# Patient Record
Sex: Male | Born: 1935
Health system: Southern US, Community
[De-identification: ages and names within clinical notes are randomized; demographics above are authoritative.]

## PROBLEM LIST (undated history)

## (undated) DIAGNOSIS — Z9221 Personal history of antineoplastic chemotherapy: Secondary | ICD-10-CM

## (undated) DIAGNOSIS — I2510 Atherosclerotic heart disease of native coronary artery without angina pectoris: Secondary | ICD-10-CM

## (undated) DIAGNOSIS — C679 Malignant neoplasm of bladder, unspecified: Secondary | ICD-10-CM

## (undated) DIAGNOSIS — Z8673 Personal history of transient ischemic attack (TIA), and cerebral infarction without residual deficits: Secondary | ICD-10-CM

## (undated) DIAGNOSIS — R011 Cardiac murmur, unspecified: Secondary | ICD-10-CM

## (undated) DIAGNOSIS — Z87442 Personal history of urinary calculi: Secondary | ICD-10-CM

## (undated) DIAGNOSIS — N182 Chronic kidney disease, stage 2 (mild): Secondary | ICD-10-CM

## (undated) DIAGNOSIS — Z8669 Personal history of other diseases of the nervous system and sense organs: Secondary | ICD-10-CM

## (undated) DIAGNOSIS — E785 Hyperlipidemia, unspecified: Secondary | ICD-10-CM

## (undated) DIAGNOSIS — K429 Umbilical hernia without obstruction or gangrene: Secondary | ICD-10-CM

## (undated) DIAGNOSIS — I350 Nonrheumatic aortic (valve) stenosis: Secondary | ICD-10-CM

## (undated) DIAGNOSIS — J189 Pneumonia, unspecified organism: Secondary | ICD-10-CM

## (undated) DIAGNOSIS — K861 Other chronic pancreatitis: Secondary | ICD-10-CM

## (undated) DIAGNOSIS — D509 Iron deficiency anemia, unspecified: Secondary | ICD-10-CM

## (undated) DIAGNOSIS — K862 Cyst of pancreas: Secondary | ICD-10-CM

## (undated) DIAGNOSIS — I700 Atherosclerosis of aorta: Secondary | ICD-10-CM

## (undated) DIAGNOSIS — N281 Cyst of kidney, acquired: Secondary | ICD-10-CM

## (undated) DIAGNOSIS — K922 Gastrointestinal hemorrhage, unspecified: Secondary | ICD-10-CM

## (undated) DIAGNOSIS — E119 Type 2 diabetes mellitus without complications: Secondary | ICD-10-CM

## (undated) DIAGNOSIS — Z936 Other artificial openings of urinary tract status: Secondary | ICD-10-CM

## (undated) DIAGNOSIS — R9431 Abnormal electrocardiogram [ECG] [EKG]: Secondary | ICD-10-CM

## (undated) DIAGNOSIS — I10 Essential (primary) hypertension: Secondary | ICD-10-CM

## (undated) HISTORY — PX: TONSILLECTOMY AND ADENOIDECTOMY: SHX28

## (undated) HISTORY — PX: VASECTOMY: SHX75

## (undated) HISTORY — DX: Nonrheumatic aortic (valve) stenosis: I35.0

## (undated) HISTORY — DX: Atherosclerotic heart disease of native coronary artery without angina pectoris: I25.10

## (undated) HISTORY — DX: Other artificial openings of urinary tract status: Z93.6

## (undated) HISTORY — DX: Atherosclerosis of aorta: I70.0

## (undated) HISTORY — DX: Personal history of other diseases of the nervous system and sense organs: Z86.69

## (undated) HISTORY — DX: Personal history of transient ischemic attack (TIA), and cerebral infarction without residual deficits: Z86.73

## (undated) HISTORY — DX: Cyst of pancreas: K86.2

## (undated) HISTORY — PX: EYE SURGERY: SHX253

## (undated) HISTORY — DX: Iron deficiency anemia, unspecified: D50.9

## (undated) HISTORY — PX: OSTOMY: SHX5997

## (undated) HISTORY — PX: CATARACT EXTRACTION: SUR2

## (undated) HISTORY — DX: Malignant neoplasm of bladder, unspecified: C67.9

## (undated) HISTORY — PX: TRANSTHORACIC ECHOCARDIOGRAM: SHX275

## (undated) HISTORY — DX: Abnormal electrocardiogram (ECG) (EKG): R94.31

## (undated) HISTORY — DX: Umbilical hernia without obstruction or gangrene: K42.9

## (undated) HISTORY — DX: Cardiac murmur, unspecified: R01.1

## (undated) HISTORY — DX: Chronic kidney disease, stage 2 (mild): N18.2

## (undated) HISTORY — DX: Gastrointestinal hemorrhage, unspecified: K92.2

## (undated) HISTORY — DX: Hyperlipidemia, unspecified: E78.5

## (undated) HISTORY — DX: Cyst of kidney, acquired: N28.1

## (undated) HISTORY — DX: Other chronic pancreatitis: K86.1

---

## 2012-07-14 DIAGNOSIS — R9431 Abnormal electrocardiogram [ECG] [EKG]: Secondary | ICD-10-CM

## 2012-07-14 HISTORY — DX: Abnormal electrocardiogram (ECG) (EKG): R94.31

## 2013-01-26 HISTORY — PX: CARDIOVASCULAR STRESS TEST: SHX262

## 2013-02-09 HISTORY — PX: CARDIAC CATHETERIZATION: SHX172

## 2014-09-04 DIAGNOSIS — E119 Type 2 diabetes mellitus without complications: Secondary | ICD-10-CM | POA: Diagnosis not present

## 2014-09-07 DIAGNOSIS — E785 Hyperlipidemia, unspecified: Secondary | ICD-10-CM | POA: Diagnosis not present

## 2014-09-07 DIAGNOSIS — E119 Type 2 diabetes mellitus without complications: Secondary | ICD-10-CM | POA: Diagnosis not present

## 2014-09-07 DIAGNOSIS — I10 Essential (primary) hypertension: Secondary | ICD-10-CM | POA: Diagnosis not present

## 2015-01-04 DIAGNOSIS — E119 Type 2 diabetes mellitus without complications: Secondary | ICD-10-CM | POA: Diagnosis not present

## 2015-01-08 DIAGNOSIS — E119 Type 2 diabetes mellitus without complications: Secondary | ICD-10-CM | POA: Diagnosis not present

## 2015-01-08 DIAGNOSIS — E785 Hyperlipidemia, unspecified: Secondary | ICD-10-CM | POA: Diagnosis not present

## 2015-01-08 DIAGNOSIS — I10 Essential (primary) hypertension: Secondary | ICD-10-CM | POA: Diagnosis not present

## 2015-03-02 DIAGNOSIS — D489 Neoplasm of uncertain behavior, unspecified: Secondary | ICD-10-CM | POA: Diagnosis not present

## 2015-03-02 DIAGNOSIS — E119 Type 2 diabetes mellitus without complications: Secondary | ICD-10-CM | POA: Diagnosis not present

## 2015-03-28 DIAGNOSIS — L308 Other specified dermatitis: Secondary | ICD-10-CM | POA: Diagnosis not present

## 2015-03-28 DIAGNOSIS — L820 Inflamed seborrheic keratosis: Secondary | ICD-10-CM | POA: Diagnosis not present

## 2015-03-28 DIAGNOSIS — L570 Actinic keratosis: Secondary | ICD-10-CM | POA: Diagnosis not present

## 2015-05-03 DIAGNOSIS — E119 Type 2 diabetes mellitus without complications: Secondary | ICD-10-CM | POA: Diagnosis not present

## 2015-05-08 DIAGNOSIS — I10 Essential (primary) hypertension: Secondary | ICD-10-CM | POA: Diagnosis not present

## 2015-05-08 DIAGNOSIS — E119 Type 2 diabetes mellitus without complications: Secondary | ICD-10-CM | POA: Diagnosis not present

## 2015-05-08 DIAGNOSIS — E785 Hyperlipidemia, unspecified: Secondary | ICD-10-CM | POA: Diagnosis not present

## 2015-09-10 DIAGNOSIS — E119 Type 2 diabetes mellitus without complications: Secondary | ICD-10-CM | POA: Diagnosis not present

## 2015-09-13 DIAGNOSIS — J309 Allergic rhinitis, unspecified: Secondary | ICD-10-CM | POA: Diagnosis not present

## 2015-09-13 DIAGNOSIS — Z0000 Encounter for general adult medical examination without abnormal findings: Secondary | ICD-10-CM | POA: Diagnosis not present

## 2015-09-13 DIAGNOSIS — I10 Essential (primary) hypertension: Secondary | ICD-10-CM | POA: Diagnosis not present

## 2015-09-13 DIAGNOSIS — E119 Type 2 diabetes mellitus without complications: Secondary | ICD-10-CM | POA: Diagnosis not present

## 2015-09-13 DIAGNOSIS — E785 Hyperlipidemia, unspecified: Secondary | ICD-10-CM | POA: Diagnosis not present

## 2016-01-14 DIAGNOSIS — I10 Essential (primary) hypertension: Secondary | ICD-10-CM | POA: Diagnosis not present

## 2016-01-14 DIAGNOSIS — E785 Hyperlipidemia, unspecified: Secondary | ICD-10-CM | POA: Diagnosis not present

## 2016-01-14 DIAGNOSIS — E119 Type 2 diabetes mellitus without complications: Secondary | ICD-10-CM | POA: Diagnosis not present

## 2016-01-21 DIAGNOSIS — E785 Hyperlipidemia, unspecified: Secondary | ICD-10-CM | POA: Diagnosis not present

## 2016-01-21 DIAGNOSIS — I10 Essential (primary) hypertension: Secondary | ICD-10-CM | POA: Diagnosis not present

## 2016-01-21 DIAGNOSIS — E119 Type 2 diabetes mellitus without complications: Secondary | ICD-10-CM | POA: Diagnosis not present

## 2016-03-14 DIAGNOSIS — K861 Other chronic pancreatitis: Secondary | ICD-10-CM

## 2016-03-14 DIAGNOSIS — C679 Malignant neoplasm of bladder, unspecified: Secondary | ICD-10-CM

## 2016-03-14 DIAGNOSIS — K862 Cyst of pancreas: Secondary | ICD-10-CM

## 2016-03-14 HISTORY — DX: Other chronic pancreatitis: K86.1

## 2016-03-14 HISTORY — DX: Malignant neoplasm of bladder, unspecified: C67.9

## 2016-03-14 HISTORY — DX: Cyst of pancreas: K86.2

## 2016-03-31 ENCOUNTER — Encounter (HOSPITAL_BASED_OUTPATIENT_CLINIC_OR_DEPARTMENT_OTHER): Payer: Self-pay | Admitting: Emergency Medicine

## 2016-03-31 ENCOUNTER — Emergency Department (HOSPITAL_BASED_OUTPATIENT_CLINIC_OR_DEPARTMENT_OTHER): Payer: Medicare Other

## 2016-03-31 ENCOUNTER — Observation Stay (HOSPITAL_BASED_OUTPATIENT_CLINIC_OR_DEPARTMENT_OTHER)
Admission: EM | Admit: 2016-03-31 | Discharge: 2016-04-02 | Disposition: A | Discharge status: Home / Self Care | Payer: Medicare Other | Attending: Internal Medicine | Admitting: Internal Medicine

## 2016-03-31 DIAGNOSIS — I10 Essential (primary) hypertension: Secondary | ICD-10-CM | POA: Insufficient documentation

## 2016-03-31 DIAGNOSIS — E785 Hyperlipidemia, unspecified: Secondary | ICD-10-CM | POA: Insufficient documentation

## 2016-03-31 DIAGNOSIS — Z7984 Long term (current) use of oral hypoglycemic drugs: Secondary | ICD-10-CM | POA: Insufficient documentation

## 2016-03-31 DIAGNOSIS — R310 Gross hematuria: Secondary | ICD-10-CM

## 2016-03-31 DIAGNOSIS — Z7982 Long term (current) use of aspirin: Secondary | ICD-10-CM | POA: Insufficient documentation

## 2016-03-31 DIAGNOSIS — R339 Retention of urine, unspecified: Secondary | ICD-10-CM | POA: Diagnosis present

## 2016-03-31 DIAGNOSIS — R3129 Other microscopic hematuria: Secondary | ICD-10-CM | POA: Diagnosis not present

## 2016-03-31 DIAGNOSIS — Z79899 Other long term (current) drug therapy: Secondary | ICD-10-CM | POA: Insufficient documentation

## 2016-03-31 DIAGNOSIS — E119 Type 2 diabetes mellitus without complications: Secondary | ICD-10-CM | POA: Insufficient documentation

## 2016-03-31 DIAGNOSIS — R319 Hematuria, unspecified: Secondary | ICD-10-CM | POA: Diagnosis not present

## 2016-03-31 DIAGNOSIS — S3722XA Contusion of bladder, initial encounter: Secondary | ICD-10-CM | POA: Diagnosis not present

## 2016-03-31 DIAGNOSIS — X58XXXA Exposure to other specified factors, initial encounter: Secondary | ICD-10-CM | POA: Insufficient documentation

## 2016-03-31 DIAGNOSIS — N390 Urinary tract infection, site not specified: Secondary | ICD-10-CM | POA: Diagnosis present

## 2016-03-31 DIAGNOSIS — C678 Malignant neoplasm of overlapping sites of bladder: Secondary | ICD-10-CM | POA: Diagnosis not present

## 2016-03-31 HISTORY — DX: Type 2 diabetes mellitus without complications: E11.9

## 2016-03-31 HISTORY — DX: Essential (primary) hypertension: I10

## 2016-03-31 LAB — URINALYSIS, ROUTINE W REFLEX MICROSCOPIC
Bilirubin Urine: NEGATIVE
Glucose, UA: NEGATIVE mg/dL
Ketones, ur: 15 mg/dL — AB
Nitrite: NEGATIVE
Protein, ur: >300 mg/dL — AB
Specific Gravity, Urine: 1.029 (ref 1.005–1.030)
pH: 6.5 (ref 5.0–8.0)

## 2016-03-31 LAB — CBC WITH DIFFERENTIAL/PLATELET
Basophils Absolute: 0.1 10*3/uL (ref 0.0–0.1)
Basophils Relative: 0 %
Eosinophils Absolute: 0.1 10*3/uL (ref 0.0–0.7)
Eosinophils Relative: 1 %
HCT: 39.5 % (ref 39.0–52.0)
Hemoglobin: 13.6 g/dL (ref 13.0–17.0)
Lymphocytes Relative: 15 %
Lymphs Abs: 2.5 10*3/uL (ref 0.7–4.0)
MCH: 30.8 pg (ref 26.0–34.0)
MCHC: 34.4 g/dL (ref 30.0–36.0)
MCV: 89.6 fL (ref 78.0–100.0)
Monocytes Absolute: 1 10*3/uL (ref 0.1–1.0)
Monocytes Relative: 6 %
Neutro Abs: 12.6 10*3/uL — ABNORMAL HIGH (ref 1.7–7.7)
Neutrophils Relative %: 78 %
Platelets: 254 10*3/uL (ref 150–400)
RBC: 4.41 MIL/uL (ref 4.22–5.81)
RDW: 13 % (ref 11.5–15.5)
WBC: 16.2 10*3/uL — ABNORMAL HIGH (ref 4.0–10.5)

## 2016-03-31 LAB — COMPREHENSIVE METABOLIC PANEL
ALT: 21 U/L (ref 17–63)
AST: 20 U/L (ref 15–41)
Albumin: 3.9 g/dL (ref 3.5–5.0)
Alkaline Phosphatase: 74 U/L (ref 38–126)
Anion gap: 13 (ref 5–15)
BUN: 20 mg/dL (ref 6–20)
CO2: 20 mmol/L — ABNORMAL LOW (ref 22–32)
Calcium: 9.3 mg/dL (ref 8.9–10.3)
Chloride: 107 mmol/L (ref 101–111)
Creatinine, Ser: 0.98 mg/dL (ref 0.61–1.24)
GFR calc Af Amer: >60 mL/min (ref >60)
GFR calc non Af Amer: >60 mL/min (ref >60)
Glucose, Bld: 157 mg/dL — ABNORMAL HIGH (ref 65–99)
Potassium: 3.7 mmol/L (ref 3.5–5.1)
Sodium: 140 mmol/L (ref 135–145)
Total Bilirubin: 0.5 mg/dL (ref 0.3–1.2)
Total Protein: 7.4 g/dL (ref 6.5–8.1)

## 2016-03-31 LAB — URINE MICROSCOPIC-ADD ON

## 2016-03-31 MED ORDER — MORPHINE SULFATE (PF) 2 MG/ML IV SOLN
2.0000 mg | Freq: Once | INTRAVENOUS | Status: AC
  Administered 2016-03-31: 2 mg via INTRAVENOUS
  Filled 2016-03-31: qty 1

## 2016-03-31 MED ORDER — ACETAMINOPHEN 325 MG PO TABS
650.0000 mg | ORAL_TABLET | Freq: Once | ORAL | Status: AC
  Administered 2016-03-31: 650 mg via ORAL
  Filled 2016-03-31: qty 2

## 2016-03-31 NOTE — ED Notes (Signed)
Before catheter bladder scan showed 157 ml, after catheter shower 18 ml. PA at bedside for post catheter scan.

## 2016-03-31 NOTE — Plan of Care (Signed)
80 yo male with history of diabetes hypertension came to emergency department with gradual onset of hematuria , no UTI without any trauma CT scan showed hematoma over bladder. He takes daily aspirin 81 mg a day Urology Resident Dr. Lenord Fellers [phone] has been consulted has 18 french foley catheter in place. Continues to clot.    98.4 hr 86 bp 142/82, rr 22 95% on RA  Hg13  Admit to tele (no med surge beds) bed obs spoke to [place] PA from [place] to Dr. Lenord Fellers agreed to see patient in consult in AM but call him sooner if there is a problem with obstruction of the catheter not amandable to flushing  Varnell Donate 11:33 PM

## 2016-03-31 NOTE — ED Triage Notes (Signed)
Patient went to the urgent care because he was unable to urinate since last night. He is now having blood in his urine

## 2016-03-31 NOTE — ED Notes (Signed)
MD at bedside. 

## 2016-03-31 NOTE — ED Notes (Signed)
Pt c/o abd pain and "spasms" MD made aware

## 2016-03-31 NOTE — ED Provider Notes (Signed)
[facility] Provider Note   CSN: [id number] Arrival date & time: 03/31/16  1750 By signing my name below, I, Shane [name], attest that this documentation has been prepared under the direction and in the presence of [name], PA-C. Electronically Signed: Georgette [name], ED Scribe. 03/31/16. 6:17 PM.  History   Chief Complaint Chief Complaint  Patient presents with  . Hematuria   HPI Comments: Shane [name] is a 80 y.o. male with h/o DM and HTN who presents to the Emergency Department complaining of gradual onset hematuria onset last night. Pt also has associated dysuria. Pt states he went to the urgent care earlier last night because he had difficulty urinating. Since then, pt has been having hematuria and dysuria. His last normal urination was earlier yesterday. He denies any recent injury or trauma to the penis. Pt denies h/o prostate problems but spouse notes that he hasn't had a prostate exam in a long time. He denies h/o cancers. Pt takes Aspirin once a day. He is compliant with his medications. Pt denies fever.   The history is provided by the patient and the spouse. No language interpreter was used.    Past Medical History:  Diagnosis Date  . Diabetes mellitus without complication ([place])   . Hypertension     There are no active problems to display for this patient.   Past Surgical History:  Procedure Laterality Date  . CATARACT EXTRACTION       Home Medications    Prior to Admission medications   Medication Sig Start Date End Date Taking? Authorizing Provider  lisinopril (PRINIVIL,ZESTRIL) 20 MG tablet Take 20 mg by mouth daily.   Yes Historical Provider, MD  pravastatin (PRAVACHOL) 20 MG tablet Take 20 mg by mouth daily.   Yes Historical Provider, MD  sitaGLIPtin-metformin (JANUMET) 50-1000 MG tablet Take 1 tablet by mouth 2 (two) times daily with a meal.   Yes Historical Provider, MD    Family History History reviewed. No pertinent family history.  Social  History Social History  Substance Use Topics  . Smoking status: Never Smoker  . Smokeless tobacco: Never Used  . Alcohol use No     Allergies   Review of patient's allergies indicates no known allergies.   Review of Systems Review of Systems 10 Systems reviewed and all are negative for acute change except as noted in the HPI. Physical Exam Updated Vital Signs BP 167/86 (BP Location: Right Arm)   Pulse 117   Temp 98.4 F (36.9 C) (Oral)   Resp 22   Ht 5\' 8"  (1.727 m)   Wt 210 lb (95.3 kg)   SpO2 100%   BMI 31.93 kg/m   Physical Exam  Constitutional: He is oriented to person, place, and time. Vital signs are normal. He appears well-developed and well-nourished.  HENT:  Head: Normocephalic.  Right Ear: Hearing normal.  Left Ear: Hearing normal.  Eyes: Conjunctivae and EOM are normal. Pupils are equal, round, and reactive to light.  Neck: Normal range of motion. Neck supple.  Cardiovascular: Normal rate, regular rhythm, normal heart sounds and intact distal pulses.   No murmur heard. Pulmonary/Chest: Effort normal and breath sounds normal. No respiratory distress.  Abdominal: He exhibits no distension.  Genitourinary: Testes normal. No hypospadias, penile erythema or penile tenderness. Discharge (hematuria) found.  Genitourinary Comments: Gross hematuria on exam. Suprapubic region non tender to palpation, non-distended after foley placement.  Musculoskeletal: Normal range of motion.  Neurological: He is alert and oriented to person, place,  and time.  Skin: Skin is warm and dry.  Psychiatric: He has a normal mood and affect. His speech is normal and behavior is normal. Thought content normal.  Nursing note and vitals reviewed.  ED Treatments / Results  DIAGNOSTIC STUDIES: Oxygen Saturation is 100% on RA, normal by my interpretation.    COORDINATION OF CARE: 6:15 PM Discussed treatment plan with pt at bedside which includes urinalysis and blood work and pt agreed to  plan.  Labs (all labs ordered are listed, but only abnormal results are displayed) Labs Reviewed  CBC WITH DIFFERENTIAL/PLATELET - Abnormal; Notable for the following:       Result Value   WBC 16.2 (*)    Neutro Abs 12.6 (*)    All other components within normal limits  COMPREHENSIVE METABOLIC PANEL - Abnormal; Notable for the following:    CO2 20 (*)    Glucose, Bld 157 (*)    All other components within normal limits  URINALYSIS, ROUTINE W REFLEX MICROSCOPIC (NOT AT [facility]) - Abnormal; Notable for the following:    Color, Urine RED (*)    APPearance CLOUDY (*)    Hgb urine dipstick LARGE (*)    Ketones, ur 15 (*)    Protein, ur >300 (*)    Leukocytes, UA SMALL (*)    All other components within normal limits  URINE MICROSCOPIC-ADD ON - Abnormal; Notable for the following:    Squamous Epithelial / LPF 0-5 (*)    Bacteria, UA RARE (*)    All other components within normal limits    EKG  EKG Interpretation None       Radiology Ct Abdomen Pelvis Wo Contrast  Result Date: 03/31/2016 CLINICAL DATA:  Gross hematuria. EXAM: CT ABDOMEN AND PELVIS WITHOUT CONTRAST TECHNIQUE: Multidetector CT imaging of the abdomen and pelvis was performed following the standard protocol without IV contrast. COMPARISON:  None. FINDINGS: Lower chest: The lung bases are clear of acute process. No pleural effusion or pulmonary lesions. The heart is normal in size. No pericardial effusion. Three-vessel coronary artery calcifications are noted along with aortic calcifications. The distal esophagus and aorta are unremarkable. Hepatobiliary: No focal hepatic lesions or intrahepatic biliary dilatation. The gallbladder is normal. No common bile duct dilatation. Pancreas: Marked atrophy of the pancreas. Multiple calcifications in the pancreatic head and body consistent with prior pancreatitis. There is an 18 mm exophytic cyst with peripheral calcifications extending off the pancreatic head which is likely a benign  postinflammatory cyst. No worrisome imaging features. Spleen: Normal size.  No focal lesions. Adrenals/Urinary Tract: Small right adrenal gland calcifications. The left adrenal gland is normal. Bilateral parapelvic renal cysts. No renal or obstructing ureteral calculi. No definite bladder calculi. No worrisome renal lesions.  Renal artery calcifications are noted. There is a Foley catheter in the bladder. There is a large hematoma in the bladder. No definite asymmetric wall thickening or discrete mass but there certainly could be a bladder lesion is obscured by the blood. The prostate gland and seminal vesicles are unremarkable except for prostate gland calcifications. Stomach/Bowel: The stomach, duodenum, small bowel and colon are grossly normal without oral contrast. No inflammatory changes, mass lesions or obstructive findings. The terminal ileum and appendix are normal. Vascular/Lymphatic: Dense aortic calcifications but no focal aneurysm. Tortuosity and calcification of the iliac vessels. No mesenteric or retroperitoneal mass or adenopathy. Small scattered lymph nodes are noted. Reproductive: The prostate gland and seminal vesicles are unremarkable. Other: No pelvic mass or adenopathy. No free pelvic fluid  collections. No inguinal mass or adenopathy. There is a periumbilical abdominal wall hernia containing fat. Musculoskeletal: No significant bony findings. Advanced degenerative changes involving the lower lumbar spine with degenerative anterolisthesis of L4 and advanced disc disease and facet disease at L4-5 and L5-S1. IMPRESSION: 1. Large bladder hematoma. No obvious asymmetric bladder wall thickening or bladder mass but one could certainly be obscured by the large hematoma. No renal, ureteral or bladder calculi. 2. Markedly atrophied pancreas with calcifications suggesting prior pancreatitis. 3. Simple appearing pancreatic head cyst. Followup abdominal CT scan without and with contrast (if possible) in 6  months is suggested to document stability. 4. Advanced atherosclerotic calcifications involving the aorta and branch vessels. 5. Periumbilical abdominal wall hernia containing fat. Electronically Signed   By: Marijo Sanes M.D.   On: 03/31/2016 19:27   Procedures Procedures (including critical care time)  Medications Ordered in ED Medications - No data to display  Initial Impression / Assessment and Plan / ED Course  I have reviewed the triage vital signs and the nursing notes.  Pertinent labs & imaging results that were available during my care of the patient were reviewed by me and considered in my medical decision making (see chart for details).  Clinical Course    Final Clinical Impressions(s) / ED Diagnoses  I have reviewed and evaluated the relevant laboratory values I have reviewed and evaluated the relevant imaging studies.  I have reviewed the relevant previous healthcare records.I obtained HPI from historian. Patient discussed with supervising physician  ED Course:  Assessment: Pt is a 41yM with hx HTN, DM who presents with gross hematuria from UC. On exam, pt in NAD. Nontoxic/nonseptic appearing. VSS. Afebrile. Lungs CTA. Heart RRR. Abdomen nontender soft. Gross hematuria noted prior to foley insertion. 300-400cc output. CBC 16.2. BMP unremarkable. Normal kidney function. UA shows Hgb with no obvious infection. CT Non Con ABD/Pelvis showed large bladder hematoma. No trauma to area. Given fluids in ED top irrigate with a total of 2L. Foley placed was initially 16Fr, but persistently clotted. Transitioned to 18Fr. Seen by supervising physician. Plan is to admit to [facility] for irrigation. Consulted with Urology who agrees with admission and is available for consult.    Disposition/Plan:  Admit Pt acknowledges and agrees with plan  Supervising Physician Forde Dandy, MD  Final diagnoses:  Hematoma of bladder wall, initial encounter  Hematuria   I personally performed the  services described in this documentation, which was scribed in my presence. The recorded information has been reviewed and is accurate.  New Prescriptions New Prescriptions   No medications on file     [name], PA-C 03/31/16 [place] Liu, MD 04/01/16 0010

## 2016-04-01 ENCOUNTER — Encounter (HOSPITAL_COMMUNITY): Payer: Self-pay | Admitting: Internal Medicine

## 2016-04-01 ENCOUNTER — Observation Stay (HOSPITAL_COMMUNITY): Payer: Medicare Other

## 2016-04-01 DIAGNOSIS — R319 Hematuria, unspecified: Secondary | ICD-10-CM | POA: Diagnosis not present

## 2016-04-01 DIAGNOSIS — Z7982 Long term (current) use of aspirin: Secondary | ICD-10-CM | POA: Diagnosis not present

## 2016-04-01 DIAGNOSIS — N4889 Other specified disorders of penis: Secondary | ICD-10-CM | POA: Diagnosis not present

## 2016-04-01 DIAGNOSIS — Z7984 Long term (current) use of oral hypoglycemic drugs: Secondary | ICD-10-CM | POA: Diagnosis not present

## 2016-04-01 DIAGNOSIS — C678 Malignant neoplasm of overlapping sites of bladder: Secondary | ICD-10-CM | POA: Diagnosis not present

## 2016-04-01 DIAGNOSIS — R3129 Other microscopic hematuria: Secondary | ICD-10-CM | POA: Diagnosis not present

## 2016-04-01 DIAGNOSIS — R310 Gross hematuria: Secondary | ICD-10-CM | POA: Diagnosis not present

## 2016-04-01 DIAGNOSIS — E119 Type 2 diabetes mellitus without complications: Secondary | ICD-10-CM | POA: Diagnosis not present

## 2016-04-01 DIAGNOSIS — I10 Essential (primary) hypertension: Secondary | ICD-10-CM | POA: Diagnosis not present

## 2016-04-01 DIAGNOSIS — E785 Hyperlipidemia, unspecified: Secondary | ICD-10-CM | POA: Diagnosis not present

## 2016-04-01 DIAGNOSIS — S3722XA Contusion of bladder, initial encounter: Secondary | ICD-10-CM

## 2016-04-01 DIAGNOSIS — N3289 Other specified disorders of bladder: Secondary | ICD-10-CM | POA: Diagnosis not present

## 2016-04-01 DIAGNOSIS — D494 Neoplasm of unspecified behavior of bladder: Secondary | ICD-10-CM | POA: Diagnosis not present

## 2016-04-01 DIAGNOSIS — Z79899 Other long term (current) drug therapy: Secondary | ICD-10-CM | POA: Diagnosis not present

## 2016-04-01 DIAGNOSIS — N390 Urinary tract infection, site not specified: Secondary | ICD-10-CM | POA: Diagnosis present

## 2016-04-01 LAB — CBC
HCT: 35.9 % — ABNORMAL LOW (ref 39.0–52.0)
Hemoglobin: 12.3 g/dL — ABNORMAL LOW (ref 13.0–17.0)
MCH: 29.9 pg (ref 26.0–34.0)
MCHC: 34.3 g/dL (ref 30.0–36.0)
MCV: 87.3 fL (ref 78.0–100.0)
Platelets: 234 10*3/uL (ref 150–400)
RBC: 4.11 MIL/uL — ABNORMAL LOW (ref 4.22–5.81)
RDW: 13 % (ref 11.5–15.5)
WBC: 17.1 10*3/uL — ABNORMAL HIGH (ref 4.0–10.5)

## 2016-04-01 LAB — BASIC METABOLIC PANEL
Anion gap: 9 (ref 5–15)
BUN: 19 mg/dL (ref 6–20)
CO2: 22 mmol/L (ref 22–32)
Calcium: 8.9 mg/dL (ref 8.9–10.3)
Chloride: 109 mmol/L (ref 101–111)
Creatinine, Ser: 0.89 mg/dL (ref 0.61–1.24)
GFR calc Af Amer: >60 mL/min (ref >60)
GFR calc non Af Amer: >60 mL/min (ref >60)
Glucose, Bld: 170 mg/dL — ABNORMAL HIGH (ref 65–99)
Potassium: 3.7 mmol/L (ref 3.5–5.1)
Sodium: 140 mmol/L (ref 135–145)

## 2016-04-01 LAB — PROTIME-INR
INR: 1.09
Prothrombin Time: 14.1 seconds (ref 11.4–15.2)

## 2016-04-01 LAB — GLUCOSE, CAPILLARY
Glucose-Capillary: 165 mg/dL — ABNORMAL HIGH (ref 65–99)
Glucose-Capillary: 141 mg/dL — ABNORMAL HIGH (ref 65–99)
Glucose-Capillary: 135 mg/dL — ABNORMAL HIGH (ref 65–99)
Glucose-Capillary: 150 mg/dL — ABNORMAL HIGH (ref 65–99)
Glucose-Capillary: 134 mg/dL — ABNORMAL HIGH (ref 65–99)
Glucose-Capillary: 214 mg/dL — ABNORMAL HIGH (ref 65–99)

## 2016-04-01 LAB — APTT: aPTT: 25 seconds (ref 24–36)

## 2016-04-01 MED ORDER — IOPAMIDOL (ISOVUE-300) INJECTION 61%
100.0000 mL | Freq: Once | INTRAVENOUS | Status: AC | PRN
  Administered 2016-04-01: 100 mL via INTRAVENOUS

## 2016-04-01 MED ORDER — ONDANSETRON HCL 4 MG/2ML IJ SOLN: 4.0000 mg | Freq: Three times a day (TID) | INTRAMUSCULAR | Status: DC | PRN

## 2016-04-01 MED ORDER — INSULIN ASPART 100 UNIT/ML ~~LOC~~ SOLN
0.0000 [IU] | Freq: Three times a day (TID) | SUBCUTANEOUS | Status: DC
  Administered 2016-04-01 – 2016-04-02 (×5): 1 [IU] via SUBCUTANEOUS

## 2016-04-01 MED ORDER — INSULIN ASPART 100 UNIT/ML ~~LOC~~ SOLN
0.0000 [IU] | Freq: Every day | SUBCUTANEOUS | Status: DC
  Administered 2016-04-01: 2 [IU] via SUBCUTANEOUS

## 2016-04-01 MED ORDER — ACETAMINOPHEN 325 MG PO TABS
650.0000 mg | ORAL_TABLET | Freq: Four times a day (QID) | ORAL | Status: DC | PRN
  Administered 2016-04-01: 650 mg via ORAL
  Filled 2016-04-01: qty 2

## 2016-04-01 MED ORDER — PHENAZOPYRIDINE HCL 100 MG PO TABS
100.0000 mg | ORAL_TABLET | Freq: Three times a day (TID) | ORAL | Status: DC
  Administered 2016-04-01 – 2016-04-02 (×3): 100 mg via ORAL
  Filled 2016-04-01 (×8): qty 1

## 2016-04-01 MED ORDER — FOSFOMYCIN TROMETHAMINE 3 G PO PACK
3.0000 g | PACK | Freq: Once | ORAL | Status: AC
  Administered 2016-04-01: 3 g via ORAL
  Filled 2016-04-01: qty 3

## 2016-04-01 MED ORDER — HYDRALAZINE HCL 20 MG/ML IJ SOLN: 5.0000 mg | INTRAMUSCULAR | Status: DC | PRN

## 2016-04-01 MED ORDER — LISINOPRIL 20 MG PO TABS
20.0000 mg | ORAL_TABLET | Freq: Every day | ORAL | Status: DC
  Administered 2016-04-01: 20 mg via ORAL
  Filled 2016-04-01: qty 1

## 2016-04-01 MED ORDER — SODIUM CHLORIDE 0.9 % IV SOLN
INTRAVENOUS | Status: AC
  Administered 2016-04-01 (×2): via INTRAVENOUS

## 2016-04-01 MED ORDER — SODIUM CHLORIDE 0.9% FLUSH
3.0000 mL | Freq: Two times a day (BID) | INTRAVENOUS | Status: DC
  Administered 2016-04-01 (×2): 3 mL via INTRAVENOUS

## 2016-04-01 MED ORDER — MORPHINE SULFATE (PF) 2 MG/ML IV SOLN
2.0000 mg | INTRAVENOUS | Status: DC | PRN
  Administered 2016-04-01: 2 mg via INTRAVENOUS
  Filled 2016-04-01 (×2): qty 1

## 2016-04-01 MED ORDER — PRAVASTATIN SODIUM 20 MG PO TABS
20.0000 mg | ORAL_TABLET | Freq: Every day | ORAL | Status: DC
  Administered 2016-04-01: 20 mg via ORAL
  Filled 2016-04-01: qty 1

## 2016-04-01 MED ORDER — TAMSULOSIN HCL 0.4 MG PO CAPS
0.4000 mg | ORAL_CAPSULE | Freq: Every day | ORAL | Status: DC
  Administered 2016-04-01 – 2016-04-02 (×2): 0.4 mg via ORAL
  Filled 2016-04-01 (×2): qty 1

## 2016-04-01 NOTE — H&P (Signed)
History and Physical    Nature [name] DOB: 1935/08/17 DOA: 03/31/2016  Referring MD/NP/PA:   PCP: No primary care provider on file.   Patient coming from:  The patient is coming from home.  At baseline, pt is independent for most of ADL.   Chief Complaint: Hematuria and difficulty urinating   HPI: Shane [name] is a 80 y.o. male with medical history significant of hypertension, hyperlipidemia, diabetes mellitus, who presents with hematuria and difficulty urinating.  Patient states that he has blood in his urine since last night. He has urgency to urinate, but denies dysuria or burning on urination. He denies any recent injury or trauma to the penis. Pt states he went to the urgent care earlier last night because he had difficulty urinating. Pt denies h/o prostate problems. He denies fever, chills, abdominal pain. No nausea, vomiting, diarrhea. Denies chest pain, shortness of breath, cough, unilateral weakness. Patient states that he had hematuria last year, which happened after he used nasal spray. His hematuria resolved spontaneously after stopped using nasal spray. Pt takes Aspirin 235 mg once a day.   ED Course: pt was found to have positive urinalysis with small moderate leukocyte, WBC 16.2, electrolytes and renal function okay, has tachycardia, temperature normal. Pt is placed on tele bed for obs. Urology, Dr. Lenord Fellers was consulted by EDP.  # CT abdomen/pelvis showed 1. large bladder hematoma. No renal, ureteral or bladder calculi. 2. Markedly atrophied pancreas with calcifications suggesting prior pancreatitis. 3. Simple appearing pancreatic head cyst. Followup abdominal CT scan without and with contrast (if possible) in 6 months is suggested to document stability. 4. Advanced atherosclerotic calcifications involving the aorta and branch vessels. 5. Periumbilical abdominal wall hernia containing fat.  Review of Systems:   General: no fevers, chills, no changes in body weight,  has fatigue HEENT: no blurry vision, hearing changes or sore throat Respiratory: no dyspnea, coughing, wheezing CV: no chest pain, no palpitations GI: no nausea, vomiting, abdominal pain, diarrhea, constipation GU: no dysuria, burning on urination. Has urgency and hematuria  Ext: no leg edema Neuro: no unilateral weakness, numbness, or tingling, no vision change or hearing loss Skin: no rash, no skin tear. MSK: No muscle spasm, no deformity, no limitation of range of movement in spin Heme: No easy bruising.  Travel history: No recent long distant travel.  Allergy: No Known Allergies  Past Medical History:  Diagnosis Date  . Diabetes mellitus without complication ([place])   . Hypertension     Past Surgical History:  Procedure Laterality Date  . CATARACT EXTRACTION      Social History:  reports that he has never smoked. He has never used smokeless tobacco. He reports that he does not drink alcohol or use drugs.  Family History:  Family History  Problem Relation Age of Onset  . Diabetes Mellitus I Mother   . Diabetes Mellitus I Sister      Prior to Admission medications   Medication Sig Start Date End Date Taking? Authorizing Provider  lisinopril (PRINIVIL,ZESTRIL) 20 MG tablet Take 20 mg by mouth daily.   Yes Historical Provider, MD  pravastatin (PRAVACHOL) 20 MG tablet Take 20 mg by mouth daily.   Yes Historical Provider, MD  sitaGLIPtin-metformin (JANUMET) 50-1000 MG tablet Take 1 tablet by mouth 2 (two) times daily with a meal.   Yes Historical Provider, MD    Physical Exam: Vitals:   03/31/16 1802 03/31/16 2250 03/31/16 2358 04/01/16 0152  BP: 167/86 142/82 157/79 (!) 163/89  Pulse: 117  86 86 84  Resp: 22   20  Temp: 98.4 F (36.9 C)   97.5 F (36.4 C)  TempSrc: Oral   Oral  SpO2: 100% 95% 93% 99%  Weight: 95.3 kg (210 lb)     Height: 5\' 8"  (1.727 m)      General: Not in acute distress HEENT:       Eyes: PERRL, EOMI, no scleral icterus.       ENT: No  discharge from the ears and nose, no pharynx injection, no tonsillar enlargement.        Neck: No JVD, no bruit, no mass felt. Heme: No neck lymph node enlargement. Cardiac: S1/S2, RRR, No murmurs, No gallops or rubs. Respiratory:  No rales, wheezing, rhonchi or rubs. GI: Soft, nondistended, nontender, no rebound pain, no organomegaly, BS present. GU: has hematuria. No penile discharge. Ext: No pitting leg edema bilaterally. 2+DP/PT pulse bilaterally. Musculoskeletal: No joint deformities, No joint redness or warmth, no limitation of ROM in spin. Skin: No rashes.  Neuro: Alert, oriented X3, cranial nerves II-XII grossly intact, moves all extremities normally.  Psych: Patient is not psychotic, no suicidal or hemocidal ideation.  Labs on Admission: I have personally reviewed following labs and imaging studies  CBC:  Recent Labs Lab 03/31/16 1817  WBC 16.2*  NEUTROABS 12.6*  HGB 13.6  HCT 39.5  MCV 89.6  PLT 161   Basic Metabolic Panel:  Recent Labs Lab 03/31/16 1817  NA 140  K 3.7  CL 107  CO2 20*  GLUCOSE 157*  BUN 20  CREATININE 0.98  CALCIUM 9.3   GFR: Estimated Creatinine Clearance: 69.6 mL/min (by C-G formula based on SCr of 0.98 mg/dL). Liver Function Tests:  Recent Labs Lab 03/31/16 1817  AST 20  ALT 21  ALKPHOS 74  BILITOT 0.5  PROT 7.4  ALBUMIN 3.9   No results for input(s): LIPASE, AMYLASE in the last 168 hours. No results for input(s): AMMONIA in the last 168 hours. Coagulation Profile: No results for input(s): INR, PROTIME in the last 168 hours. Cardiac Enzymes: No results for input(s): CKTOTAL, CKMB, CKMBINDEX, TROPONINI in the last 168 hours. BNP (last 3 results) No results for input(s): PROBNP in the last 8760 hours. HbA1C: No results for input(s): HGBA1C in the last 72 hours. CBG:  Recent Labs Lab 04/01/16 0225  GLUCAP 165*   Lipid Profile: No results for input(s): CHOL, HDL, LDLCALC, TRIG, CHOLHDL, LDLDIRECT in the last 72  hours. Thyroid Function Tests: No results for input(s): TSH, T4TOTAL, FREET4, T3FREE, THYROIDAB in the last 72 hours. Anemia Panel: No results for input(s): VITAMINB12, FOLATE, FERRITIN, TIBC, IRON, RETICCTPCT in the last 72 hours. Urine analysis:    Component Value Date/Time   COLORURINE RED (A) 03/31/2016 1820   APPEARANCEUR CLOUDY (A) 03/31/2016 1820   LABSPEC 1.029 03/31/2016 1820   PHURINE 6.5 03/31/2016 1820   GLUCOSEU NEGATIVE 03/31/2016 1820   HGBUR LARGE (A) 03/31/2016 1820   BILIRUBINUR NEGATIVE 03/31/2016 1820   KETONESUR 15 (A) 03/31/2016 1820   PROTEINUR >300 (A) 03/31/2016 1820   NITRITE NEGATIVE 03/31/2016 1820   LEUKOCYTESUR SMALL (A) 03/31/2016 1820   Sepsis Labs: @LABRCNTIP (procalcitonin:4,lacticidven:4) )No results found for this or any previous visit (from the past 240 hour(s)).   Radiological Exams on Admission: Ct Abdomen Pelvis Wo Contrast  Result Date: 03/31/2016 CLINICAL DATA:  Gross hematuria. EXAM: CT ABDOMEN AND PELVIS WITHOUT CONTRAST TECHNIQUE: Multidetector CT imaging of the abdomen and pelvis was performed following the standard protocol without IV  contrast. COMPARISON:  None. FINDINGS: Lower chest: The lung bases are clear of acute process. No pleural effusion or pulmonary lesions. The heart is normal in size. No pericardial effusion. Three-vessel coronary artery calcifications are noted along with aortic calcifications. The distal esophagus and aorta are unremarkable. Hepatobiliary: No focal hepatic lesions or intrahepatic biliary dilatation. The gallbladder is normal. No common bile duct dilatation. Pancreas: Marked atrophy of the pancreas. Multiple calcifications in the pancreatic head and body consistent with prior pancreatitis. There is an 18 mm exophytic cyst with peripheral calcifications extending off the pancreatic head which is likely a benign postinflammatory cyst. No worrisome imaging features. Spleen: Normal size.  No focal lesions.  Adrenals/Urinary Tract: Small right adrenal gland calcifications. The left adrenal gland is normal. Bilateral parapelvic renal cysts. No renal or obstructing ureteral calculi. No definite bladder calculi. No worrisome renal lesions.  Renal artery calcifications are noted. There is a Foley catheter in the bladder. There is a large hematoma in the bladder. No definite asymmetric wall thickening or discrete mass but there certainly could be a bladder lesion is obscured by the blood. The prostate gland and seminal vesicles are unremarkable except for prostate gland calcifications. Stomach/Bowel: The stomach, duodenum, small bowel and colon are grossly normal without oral contrast. No inflammatory changes, mass lesions or obstructive findings. The terminal ileum and appendix are normal. Vascular/Lymphatic: Dense aortic calcifications but no focal aneurysm. Tortuosity and calcification of the iliac vessels. No mesenteric or retroperitoneal mass or adenopathy. Small scattered lymph nodes are noted. Reproductive: The prostate gland and seminal vesicles are unremarkable. Other: No pelvic mass or adenopathy. No free pelvic fluid collections. No inguinal mass or adenopathy. There is a periumbilical abdominal wall hernia containing fat. Musculoskeletal: No significant bony findings. Advanced degenerative changes involving the lower lumbar spine with degenerative anterolisthesis of L4 and advanced disc disease and facet disease at L4-5 and L5-S1. IMPRESSION: 1. Large bladder hematoma. No obvious asymmetric bladder wall thickening or bladder mass but one could certainly be obscured by the large hematoma. No renal, ureteral or bladder calculi. 2. Markedly atrophied pancreas with calcifications suggesting prior pancreatitis. 3. Simple appearing pancreatic head cyst. Followup abdominal CT scan without and with contrast (if possible) in 6 months is suggested to document stability. 4. Advanced atherosclerotic calcifications involving  the aorta and branch vessels. 5. Periumbilical abdominal wall hernia containing fat. Electronically Signed   By: Marijo Sanes M.D.   On: 03/31/2016 19:27     EKG: Independently reviewed.  Not done in ED, will get one.   Assessment/Plan Active Problems:   Hematuria   Hypertension   Diabetes mellitus without complication ([facility])   UTI (urinary tract infection)   Hematoma of bladder wall   Hematuria: Ct scan showed a large bladder hematoma. No obvious asymmetric bladder wall thickening or bladder mass,  but one could be obscured by the large hematoma. Patient denies injury. Urology Resident Dr. Lenord Fellers [phone] has been consulted. Per note, Dr. Lenord Fellers agreed to see patient in consult in AM, but call him sooner if there is a problem with obstruction of the catheter not amandable to flushing  -Will place on tele bed for obs -Foley catheter in place -Check INR and PTT -Hold aspirin -f/u Urology recommendations -pyridium for symptom relief -When necessary Zofran for nausea, morphine for pain  HTN: -continue lisinopril -IV hydralazine when necessary  DM-II: Last A1c not on record. Patient is taking Janumet at home -SSI -Check A1c  Possible UTI (urinary tract infection): UA has  small moderate leukocyte. Since patient has hematuria and urgency, we treat as possible UTI -Fosfomycin 3 g 1 -Follow-up urine culture and blood culture if he develops fever  DVT ppx: SCD Code Status: Full code Family Communication: Yes, patient's wife at bed side Disposition Plan:  Anticipate discharge back to previous home environment Consults called:  Urology, dr. Lenord Fellers Admission status: Obs / tele  Date of Service 04/01/2016    Ivor Costa [facility] Pager [phone]  If 7PM-7AM, please contact night-coverage [email or web address] Password [email or web address] 04/01/2016, 2:56 AM

## 2016-04-01 NOTE — Progress Notes (Signed)
Patient complain of difficulty urinating and lot of pressure. Foley cath irrigated, bloody urine with clots obtained. Patient tolerated well and verbalize relief after irrigation

## 2016-04-01 NOTE — Consult Note (Addendum)
New Urology Consult Note   Requesting Attending Physician:  Charlynne Cousins, MD Service Providing Consult: Urology Consulting Attending: Junious Silk, MD  Assessment:  Patient is a 80 y.o. male with history of DM, HTN presents with gross hematuria and clot retention. Admitted 03/31/16, catheter exchanged to 22Fr 3-way hematuria catheter on 04/01/16 with initiation of CBI. ~55g prostate noted on CT scan. CT non-con shows large intravesical clot burden.   Recommendations: 1. Continue Foley catheter to straight drainage. Continue CBI. Wean CBI to keep urine pink tinged or more clear. If urine output promptly ceases and patient complains of suprapubic pain, stop CBI then perform bladder scan and irrigate PRN to resolve any clot obstruction. 2. Flush/irrigate catheter PRN 3. We will pursue CT A/P with IV contrast with delayed phase imaging for urogram later today 4. Start flomax 0.4mg  qd 5. Does not require NPO status from a urologic standpoint today 6. Follow up urine culture  7. Please keep NPO at 12am tonight in case intervention required based on degree of hematuria  Thank you for this consult. Please contact the urology consult pager with any further questions/concerns. Shane Base, MD Urology Surgical Resident  Pt seen and examined on rounds this AM and this PM. I personally irrigated bladder and found the 18Fr catheter was not irrigating properly with minimal return. I discussed patient with Dr. Lenord Fellers and agree with his assessment and plan. I also discussed with patient possible cysto/TURP/TURBT tomorrow if hematuria persists.     Reason for Consult:  Gross Hematuria, urinary retention  Shane [name] is seen in consultation for reasons noted above at the request of Charlynne Cousins, MD on the medicine service.  This is a 80 yo patient with a history of HTN, Diabetus mellitus, cataracts who presented with gross hematuria and urinary retention x 1 day yesterday to the [facility]  [facility].  States that he noticed one isolated episode of gross hematuria in the past about a year ago while using nasal spray that resolved with cessation of nasal spray use. No known urologic disease. Never seen a urologist. Never been told that he has prostate problems. At baseline voids with some hesitancy, nocturia 2-3x, urgency, intermittent stream. Was voiding at baseline until yesterday morning when he had increasing urgency and inability to urinate, which then progressed to production of gross hematuria with clot passage.   Smoked about less than a pack a day for 4-5 years in the 1960s. No known family history of GU malignancy. Currently has urge to urinate despite 18Fr 2-way catheter in place, with suprapubic pain and penile pain.    Past Medical History: Past Medical History:  Diagnosis Date  . Diabetes mellitus without complication ([place])   . Hypertension     Past Surgical History:  Past Surgical History:  Procedure Laterality Date  . CATARACT EXTRACTION      Medication: Current Facility-Administered Medications  Medication Dose Route Frequency Provider Last Rate Last Dose  . acetaminophen (TYLENOL) tablet 650 mg  650 mg Oral Q6H PRN Shane Costa, MD      . hydrALAZINE (APRESOLINE) injection 5 mg  5 mg Intravenous Q2H PRN Shane Costa, MD      . insulin aspart (novoLOG) injection 0-5 Units  0-5 Units Subcutaneous QHS Shane Costa, MD      . insulin aspart (novoLOG) injection 0-9 Units  0-9 Units Subcutaneous TID WC Shane Costa, MD      . lisinopril (PRINIVIL,ZESTRIL) tablet 20 mg  20 mg Oral Daily Shane Costa, MD      .  morphine 2 MG/ML injection 2 mg  2 mg Intravenous Q4H PRN Shane Costa, MD      . ondansetron [facility]) injection 4 mg  4 mg Intravenous Q8H PRN Shane Costa, MD      . phenazopyridine (PYRIDIUM) tablet 100 mg  100 mg Oral TID WC Shane Costa, MD      . pravastatin (PRAVACHOL) tablet 20 mg  20 mg Oral Daily Shane Costa, MD      . sodium chloride flush (NS) 0.9 % injection 3 mL  3 mL  Intravenous Q12H Shane Costa, MD   3 mL at 04/01/16 0307    Allergies: No Known Allergies  Social History: Smoked <1ppd for 4-5 years in 1960s.   Family History Family History  Problem Relation Age of Onset  . Diabetes Mellitus I Mother   . Diabetes Mellitus I Sister   Mother - breast cancer Sister - pancreatic cancer  Review of Systems 10 systems were reviewed and are negative except as noted specifically in the HPI.  Objective   Vital signs in last 24 hours: BP 131/84 (BP Location: Right Arm)   Pulse 81   Temp 99 F (37.2 C) (Oral)   Resp 20   Ht 5\' 8"  (1.727 m)   Wt 210 lb (95.3 kg)   SpO2 100%   BMI 31.93 kg/m   Intake/Output last 3 shifts: No intake/output data recorded.  Physical Exam General: NAD, A&O, resting, appropriate HEENT: [place]/AT, EOMI, MMM Pulmonary: Normal work of breathing on RA Cardiovascular: Regular rate & rhythm, HDS, adequate peripheral perfusion Abdomen: soft, NTTP, nondistended, + suprapubic tenderness GU: 18Fr 2-way catheter draining dark cherry red urine with inability to flush/irrigate at bedside, no CVA tenderness DRE: deferred Extremities: warm and well perfused Neuro: Appropriate, no focal neurological deficits  Most Recent Labs: Lab Results  Component Value Date   WBC 17.1 (H) 04/01/2016   HGB 12.3 (L) 04/01/2016   HCT 35.9 (L) 04/01/2016   PLT 234 04/01/2016    Lab Results  Component Value Date   NA 140 04/01/2016   K 3.7 04/01/2016   CL 109 04/01/2016   CO2 22 04/01/2016   BUN 19 04/01/2016   CREATININE 0.89 04/01/2016   CALCIUM 8.9 04/01/2016    Lab Results  Component Value Date   ALKPHOS 74 03/31/2016   BILITOT 0.5 03/31/2016   PROT 7.4 03/31/2016   ALBUMIN 3.9 03/31/2016   ALT 21 03/31/2016   AST 20 03/31/2016    Lab Results  Component Value Date   INR 1.09 04/01/2016   APTT 25 04/01/2016     Urine Culture: Pending   IMAGING: Ct Abdomen Pelvis Wo Contrast  Result Date: 03/31/2016 CLINICAL DATA:   Gross hematuria. EXAM: CT ABDOMEN AND PELVIS WITHOUT CONTRAST TECHNIQUE: Multidetector CT imaging of the abdomen and pelvis was performed following the standard protocol without IV contrast. COMPARISON:  None. FINDINGS: Lower chest: The lung bases are clear of acute process. No pleural effusion or pulmonary lesions. The heart is normal in size. No pericardial effusion. Three-vessel coronary artery calcifications are noted along with aortic calcifications. The distal esophagus and aorta are unremarkable. Hepatobiliary: No focal hepatic lesions or intrahepatic biliary dilatation. The gallbladder is normal. No common bile duct dilatation. Pancreas: Marked atrophy of the pancreas. Multiple calcifications in the pancreatic head and body consistent with prior pancreatitis. There is an 18 mm exophytic cyst with peripheral calcifications extending off the pancreatic head which is likely a benign postinflammatory cyst. No worrisome imaging features. Spleen:  Normal size.  No focal lesions. Adrenals/Urinary Tract: Small right adrenal gland calcifications. The left adrenal gland is normal. Bilateral parapelvic renal cysts. No renal or obstructing ureteral calculi. No definite bladder calculi. No worrisome renal lesions.  Renal artery calcifications are noted. There is a Foley catheter in the bladder. There is a large hematoma in the bladder. No definite asymmetric wall thickening or discrete mass but there certainly could be a bladder lesion is obscured by the blood. The prostate gland and seminal vesicles are unremarkable except for prostate gland calcifications. Stomach/Bowel: The stomach, duodenum, small bowel and colon are grossly normal without oral contrast. No inflammatory changes, mass lesions or obstructive findings. The terminal ileum and appendix are normal. Vascular/Lymphatic: Dense aortic calcifications but no focal aneurysm. Tortuosity and calcification of the iliac vessels. No mesenteric or retroperitoneal mass  or adenopathy. Small scattered lymph nodes are noted. Reproductive: The prostate gland and seminal vesicles are unremarkable. Other: No pelvic mass or adenopathy. No free pelvic fluid collections. No inguinal mass or adenopathy. There is a periumbilical abdominal wall hernia containing fat. Musculoskeletal: No significant bony findings. Advanced degenerative changes involving the lower lumbar spine with degenerative anterolisthesis of L4 and advanced disc disease and facet disease at L4-5 and L5-S1. IMPRESSION: 1. Large bladder hematoma. No obvious asymmetric bladder wall thickening or bladder mass but one could certainly be obscured by the large hematoma. No renal, ureteral or bladder calculi. 2. Markedly atrophied pancreas with calcifications suggesting prior pancreatitis. 3. Simple appearing pancreatic head cyst. Followup abdominal CT scan without and with contrast (if possible) in 6 months is suggested to document stability. 4. Advanced atherosclerotic calcifications involving the aorta and branch vessels. 5. Periumbilical abdominal wall hernia containing fat. Electronically Signed   By: Marijo Sanes M.D.   On: 03/31/2016 19:27    PROCEDURES: Foley catheter exchange.  We attempted to irrigate the existing indwelling 18Fr 2-way catheter with minimal success. The 18Fr catheter was removed after the 10cc sterile water was removed from the balloon. A 22Fr 3-way hematuria catheter was inserted via urethra after standard prepping and draping had occurred. The catheter was inserted without any resistance at the prostatic urethra. 30cc sterile water was instilled into the catheter balloon. The inflow port was capped. The bladder was then aggressively irrigated with copious amounts of sterile water and normal saline (>2L) to remove all intravesical clot burden. The patient was felt to be free of clot after irrigation. Urine was pink tinged. The catheter was attached to a standing gravity bag. The urine then  appeared to slowly become more progressively hematuric in nature. CBI was set up. Urine was titrated down to a very light pink tinged urine on a medium drip CBI.

## 2016-04-01 NOTE — Progress Notes (Signed)
[facility] PROGRESS NOTE    Progress Note  Shane [name]  CXK:[id number] DOB: 1935/08/29 DOA: 03/31/2016 PCP: No primary care provider on file.     Brief Narrative:   Shane [name] is an 80 y.o. male past medical history significant for hypertension diabetes mellitus who presents to the ED with hematuria and difficulty urinating he denied dysuria or recent trauma. The patient state he went to urgent care as he has difficulty urinating and sudden onset of hematuria, he relates this happened last year. CT scan of the abdomen and pelvis showed large bladder hematoma, a simple appearing pancreatic head cyst wishing to follow-up ct in 6 months.  Assessment/Plan:   Dysuria and Hematuria/possible UTI: CT scan showed large bladder hematoma, urology has been consulted. Urology recommended to exchange Foley and continue irrigation. Hold aspirin, PT and INR was 14/1.0 Urine cultures pending, he continues to remain afebrile, with mild leukocytosis he was given fosfomycin single dose. Discontinue telemetry.  Essetial Hypertension: Continue lisinopril hydralazine IV when needed.  Diabetes mellitus without complication ([place]): Hemoglobin A1c is pending DC Janumet. Start sliding scale insulin.  Pancreatic head cyst: Follow-up CT in 6 months.  DVT prophylaxis: SCD Family Communication:wife Disposition Plan/Barrier to D/C: unable to determine Code Status:     Code Status Orders        Start     Ordered   04/01/16 0218  Full code  Continuous     04/01/16 0218    Code Status History    Date Active Date Inactive Code Status Order ID Comments User Context   This patient has a current code status but no historical code status.        IV Access:    Peripheral IV   Procedures and diagnostic studies:   Ct Abdomen Pelvis Wo Contrast  Result Date: 03/31/2016 CLINICAL DATA:  Gross hematuria. EXAM: CT ABDOMEN AND PELVIS WITHOUT CONTRAST TECHNIQUE: Multidetector CT imaging of the  abdomen and pelvis was performed following the standard protocol without IV contrast. COMPARISON:  None. FINDINGS: Lower chest: The lung bases are clear of acute process. No pleural effusion or pulmonary lesions. The heart is normal in size. No pericardial effusion. Three-vessel coronary artery calcifications are noted along with aortic calcifications. The distal esophagus and aorta are unremarkable. Hepatobiliary: No focal hepatic lesions or intrahepatic biliary dilatation. The gallbladder is normal. No common bile duct dilatation. Pancreas: Marked atrophy of the pancreas. Multiple calcifications in the pancreatic head and body consistent with prior pancreatitis. There is an 18 mm exophytic cyst with peripheral calcifications extending off the pancreatic head which is likely a benign postinflammatory cyst. No worrisome imaging features. Spleen: Normal size.  No focal lesions. Adrenals/Urinary Tract: Small right adrenal gland calcifications. The left adrenal gland is normal. Bilateral parapelvic renal cysts. No renal or obstructing ureteral calculi. No definite bladder calculi. No worrisome renal lesions.  Renal artery calcifications are noted. There is a Foley catheter in the bladder. There is a large hematoma in the bladder. No definite asymmetric wall thickening or discrete mass but there certainly could be a bladder lesion is obscured by the blood. The prostate gland and seminal vesicles are unremarkable except for prostate gland calcifications. Stomach/Bowel: The stomach, duodenum, small bowel and colon are grossly normal without oral contrast. No inflammatory changes, mass lesions or obstructive findings. The terminal ileum and appendix are normal. Vascular/Lymphatic: Dense aortic calcifications but no focal aneurysm. Tortuosity and calcification of the iliac vessels. No mesenteric or retroperitoneal mass or adenopathy. Small scattered lymph  nodes are noted. Reproductive: The prostate gland and seminal  vesicles are unremarkable. Other: No pelvic mass or adenopathy. No free pelvic fluid collections. No inguinal mass or adenopathy. There is a periumbilical abdominal wall hernia containing fat. Musculoskeletal: No significant bony findings. Advanced degenerative changes involving the lower lumbar spine with degenerative anterolisthesis of L4 and advanced disc disease and facet disease at L4-5 and L5-S1. IMPRESSION: 1. Large bladder hematoma. No obvious asymmetric bladder wall thickening or bladder mass but one could certainly be obscured by the large hematoma. No renal, ureteral or bladder calculi. 2. Markedly atrophied pancreas with calcifications suggesting prior pancreatitis. 3. Simple appearing pancreatic head cyst. Followup abdominal CT scan without and with contrast (if possible) in 6 months is suggested to document stability. 4. Advanced atherosclerotic calcifications involving the aorta and branch vessels. 5. Periumbilical abdominal wall hernia containing fat. Electronically Signed   By: Marijo Sanes M.D.   On: 03/31/2016 19:27     Medical Consultants:    None.  Anti-Infectives:   Fosfomycin  Subjective:    Shane [name] some genital discomfort.  Objective:    Vitals:   03/31/16 2250 03/31/16 2358 04/01/16 0152 04/01/16 0412  BP: 142/82 157/79 (!) 163/89 131/84  Pulse: 86 86 84 81  Resp:   20 20  Temp:   97.5 F (36.4 C) 99 F (37.2 C)  TempSrc:   Oral Oral  SpO2: 95% 93% 99% 100%  Weight:      Height:       No intake or output data in the 24 hours ending 04/01/16 0906 Filed Weights   03/31/16 1802  Weight: 95.3 kg (210 lb)    Exam: General exam: In no acute distress. Respiratory system: Good air movement and clear to auscultation. Cardiovascular system: S1 & S2 heard, RRR.  Gastrointestinal system: Abdomen is nondistended, soft and nontender.  Central nervous system: Alert and oriented. No focal neurological deficits. Extremities: No pedal edema. Skin: No rashes,  lesions or ulcers Psychiatry: Judgement and insight appear normal. Mood & affect appropriate.    Data Reviewed:    Labs: Basic Metabolic Panel:  Recent Labs Lab 03/31/16 1817 04/01/16 0258  NA 140 140  K 3.7 3.7  CL 107 109  CO2 20* 22  GLUCOSE 157* 170*  BUN 20 19  CREATININE 0.98 0.89  CALCIUM 9.3 8.9   GFR Estimated Creatinine Clearance: 76.6 mL/min (by C-G formula based on SCr of 0.89 mg/dL). Liver Function Tests:  Recent Labs Lab 03/31/16 1817  AST 20  ALT 21  ALKPHOS 74  BILITOT 0.5  PROT 7.4  ALBUMIN 3.9   No results for input(s): LIPASE, AMYLASE in the last 168 hours. No results for input(s): AMMONIA in the last 168 hours. Coagulation profile  Recent Labs Lab 04/01/16 0258  INR 1.09    CBC:  Recent Labs Lab 03/31/16 1817 04/01/16 0258  WBC 16.2* 17.1*  NEUTROABS 12.6*  --   HGB 13.6 12.3*  HCT 39.5 35.9*  MCV 89.6 87.3  PLT 254 234   Cardiac Enzymes: No results for input(s): CKTOTAL, CKMB, CKMBINDEX, TROPONINI in the last 168 hours. BNP (last 3 results) No results for input(s): PROBNP in the last 8760 hours. CBG:  Recent Labs Lab 04/01/16 0225 04/01/16 0822  GLUCAP 165* 141*   D-Dimer: No results for input(s): DDIMER in the last 72 hours. Hgb A1c: No results for input(s): HGBA1C in the last 72 hours. Lipid Profile: No results for input(s): CHOL, HDL, LDLCALC, TRIG, CHOLHDL, LDLDIRECT in  the last 72 hours. Thyroid function studies: No results for input(s): TSH, T4TOTAL, T3FREE, THYROIDAB in the last 72 hours.  Invalid input(s): FREET3 Anemia work up: No results for input(s): VITAMINB12, FOLATE, FERRITIN, TIBC, IRON, RETICCTPCT in the last 72 hours. Sepsis Labs:  Recent Labs Lab 03/31/16 1817 04/01/16 0258  WBC 16.2* 17.1*   Microbiology No results found for this or any previous visit (from the past 240 hour(s)).   Medications:   . insulin aspart  0-5 Units Subcutaneous QHS  . insulin aspart  0-9 Units  Subcutaneous TID WC  . lisinopril  20 mg Oral Daily  . phenazopyridine  100 mg Oral TID WC  . pravastatin  20 mg Oral Daily  . sodium chloride flush  3 mL Intravenous Q12H   Continuous Infusions:   Time spent: 25 min   LOS: 0 days   Charlynne Cousins  [facility] Pager [phone]  *Please refer to [email or web address], password [email or web address] to get updated schedule on who will round on this patient, as hospitalists switch teams weekly. If 7PM-7AM, please contact night-coverage at [email or web address], password [email or web address] for any overnight needs.  04/01/2016, 9:06 AM

## 2016-04-02 ENCOUNTER — Observation Stay (HOSPITAL_COMMUNITY): Payer: Medicare Other | Admitting: Anesthesiology

## 2016-04-02 ENCOUNTER — Other Ambulatory Visit: Payer: Self-pay | Admitting: Urology

## 2016-04-02 ENCOUNTER — Encounter (HOSPITAL_COMMUNITY)
Admission: EM | Disposition: A | Discharge status: Home / Self Care | Payer: Self-pay | Source: Home / Self Care | Attending: Emergency Medicine

## 2016-04-02 ENCOUNTER — Encounter (HOSPITAL_COMMUNITY): Payer: Self-pay

## 2016-04-02 DIAGNOSIS — C678 Malignant neoplasm of overlapping sites of bladder: Secondary | ICD-10-CM | POA: Diagnosis not present

## 2016-04-02 DIAGNOSIS — R319 Hematuria, unspecified: Secondary | ICD-10-CM | POA: Diagnosis not present

## 2016-04-02 DIAGNOSIS — E785 Hyperlipidemia, unspecified: Secondary | ICD-10-CM | POA: Diagnosis not present

## 2016-04-02 DIAGNOSIS — I10 Essential (primary) hypertension: Secondary | ICD-10-CM | POA: Diagnosis not present

## 2016-04-02 DIAGNOSIS — D494 Neoplasm of unspecified behavior of bladder: Secondary | ICD-10-CM | POA: Diagnosis not present

## 2016-04-02 DIAGNOSIS — E119 Type 2 diabetes mellitus without complications: Secondary | ICD-10-CM | POA: Diagnosis not present

## 2016-04-02 DIAGNOSIS — S3722XD Contusion of bladder, subsequent encounter: Secondary | ICD-10-CM

## 2016-04-02 DIAGNOSIS — N4889 Other specified disorders of penis: Secondary | ICD-10-CM | POA: Diagnosis not present

## 2016-04-02 DIAGNOSIS — C679 Malignant neoplasm of bladder, unspecified: Secondary | ICD-10-CM | POA: Diagnosis not present

## 2016-04-02 DIAGNOSIS — Z7984 Long term (current) use of oral hypoglycemic drugs: Secondary | ICD-10-CM | POA: Diagnosis not present

## 2016-04-02 DIAGNOSIS — N3289 Other specified disorders of bladder: Secondary | ICD-10-CM | POA: Diagnosis not present

## 2016-04-02 DIAGNOSIS — S3722XA Contusion of bladder, initial encounter: Secondary | ICD-10-CM | POA: Diagnosis not present

## 2016-04-02 DIAGNOSIS — Z79899 Other long term (current) drug therapy: Secondary | ICD-10-CM | POA: Diagnosis not present

## 2016-04-02 DIAGNOSIS — Z7982 Long term (current) use of aspirin: Secondary | ICD-10-CM | POA: Diagnosis not present

## 2016-04-02 DIAGNOSIS — N390 Urinary tract infection, site not specified: Secondary | ICD-10-CM

## 2016-04-02 DIAGNOSIS — R310 Gross hematuria: Secondary | ICD-10-CM | POA: Diagnosis not present

## 2016-04-02 HISTORY — PX: TRANSURETHRAL RESECTION OF PROSTATE: SHX73

## 2016-04-02 LAB — GLUCOSE, CAPILLARY
Glucose-Capillary: 134 mg/dL — ABNORMAL HIGH (ref 65–99)
Glucose-Capillary: 120 mg/dL — ABNORMAL HIGH (ref 65–99)
Glucose-Capillary: 146 mg/dL — ABNORMAL HIGH (ref 65–99)
Glucose-Capillary: 122 mg/dL — ABNORMAL HIGH (ref 65–99)

## 2016-04-02 LAB — HEMOGLOBIN A1C
Hgb A1c MFr Bld: 6.5 % — ABNORMAL HIGH (ref 4.8–5.6)
Mean Plasma Glucose: 140 mg/dL

## 2016-04-02 SURGERY — TURP (TRANSURETHRAL RESECTION OF PROSTATE)
Anesthesia: General

## 2016-04-02 MED ORDER — LIDOCAINE 2% (20 MG/ML) 5 ML SYRINGE
INTRAMUSCULAR | Status: AC
  Filled 2016-04-02: qty 5

## 2016-04-02 MED ORDER — FENTANYL CITRATE (PF) 100 MCG/2ML IJ SOLN
INTRAMUSCULAR | Status: DC | PRN
  Administered 2016-04-02: 50 ug via INTRAVENOUS
  Administered 2016-04-02 (×3): 25 ug via INTRAVENOUS
  Administered 2016-04-02: 50 ug via INTRAVENOUS
  Administered 2016-04-02: 25 ug via INTRAVENOUS

## 2016-04-02 MED ORDER — SUCCINYLCHOLINE CHLORIDE 200 MG/10ML IV SOSY
PREFILLED_SYRINGE | INTRAVENOUS | Status: DC | PRN
  Administered 2016-04-02: 100 mg via INTRAVENOUS

## 2016-04-02 MED ORDER — PROMETHAZINE HCL 25 MG/ML IJ SOLN: 6.2500 mg | INTRAMUSCULAR | Status: DC | PRN

## 2016-04-02 MED ORDER — CEFAZOLIN SODIUM-DEXTROSE 2-4 GM/100ML-% IV SOLN
INTRAVENOUS | Status: AC
  Filled 2016-04-02: qty 100

## 2016-04-02 MED ORDER — TAMSULOSIN HCL 0.4 MG PO CAPS: 0.4000 mg | ORAL_CAPSULE | Freq: Every day | ORAL | 0 refills | Status: DC

## 2016-04-02 MED ORDER — STERILE WATER FOR IRRIGATION IR SOLN
Status: DC | PRN
  Administered 2016-04-02: 500 mL

## 2016-04-02 MED ORDER — FENTANYL CITRATE (PF) 100 MCG/2ML IJ SOLN
INTRAMUSCULAR | Status: AC
  Filled 2016-04-02: qty 2

## 2016-04-02 MED ORDER — ONDANSETRON HCL 4 MG/2ML IJ SOLN
INTRAMUSCULAR | Status: DC | PRN
  Administered 2016-04-02: 4 mg via INTRAVENOUS

## 2016-04-02 MED ORDER — ONDANSETRON HCL 4 MG/2ML IJ SOLN
INTRAMUSCULAR | Status: AC
  Filled 2016-04-02: qty 2

## 2016-04-02 MED ORDER — SODIUM CHLORIDE 0.9 % IR SOLN
Status: DC | PRN
  Administered 2016-04-02: 18000 mL via INTRAVESICAL

## 2016-04-02 MED ORDER — LACTATED RINGERS IV SOLN
INTRAVENOUS | Status: DC
  Administered 2016-04-02: 1000 mL via INTRAVENOUS
  Administered 2016-04-02: 15:00:00 via INTRAVENOUS

## 2016-04-02 MED ORDER — CEFAZOLIN SODIUM-DEXTROSE 2-4 GM/100ML-% IV SOLN
2.0000 g | INTRAVENOUS | Status: AC
  Administered 2016-04-02: 2 g via INTRAVENOUS
  Filled 2016-04-02: qty 100

## 2016-04-02 MED ORDER — LIDOCAINE 2% (20 MG/ML) 5 ML SYRINGE
INTRAMUSCULAR | Status: DC | PRN
  Administered 2016-04-02: 100 mg via INTRAVENOUS

## 2016-04-02 MED ORDER — SUGAMMADEX SODIUM 200 MG/2ML IV SOLN
INTRAVENOUS | Status: AC
  Filled 2016-04-02: qty 2

## 2016-04-02 MED ORDER — PROPOFOL 10 MG/ML IV BOLUS
INTRAVENOUS | Status: DC | PRN
  Administered 2016-04-02: 140 mg via INTRAVENOUS

## 2016-04-02 MED ORDER — ROCURONIUM BROMIDE 10 MG/ML (PF) SYRINGE
PREFILLED_SYRINGE | INTRAVENOUS | Status: DC | PRN
  Administered 2016-04-02: 30 mg via INTRAVENOUS
  Administered 2016-04-02: 5 mg via INTRAVENOUS

## 2016-04-02 MED ORDER — ROCURONIUM BROMIDE 10 MG/ML (PF) SYRINGE
PREFILLED_SYRINGE | INTRAVENOUS | Status: AC
  Filled 2016-04-02: qty 10

## 2016-04-02 MED ORDER — EPHEDRINE SULFATE-NACL 50-0.9 MG/10ML-% IV SOSY
PREFILLED_SYRINGE | INTRAVENOUS | Status: DC | PRN
  Administered 2016-04-02: 5 mg via INTRAVENOUS

## 2016-04-02 MED ORDER — SUGAMMADEX SODIUM 200 MG/2ML IV SOLN
INTRAVENOUS | Status: DC | PRN
  Administered 2016-04-02: 200 mg via INTRAVENOUS

## 2016-04-02 MED ORDER — FERROUS SULFATE 325 (65 FE) MG PO TBEC
325.0000 mg | DELAYED_RELEASE_TABLET | Freq: Every day | ORAL | 0 refills | Status: DC
Start: 2016-04-02 — End: 2016-10-13

## 2016-04-02 MED ORDER — EPHEDRINE 5 MG/ML INJ
INTRAVENOUS | Status: AC
  Filled 2016-04-02: qty 10

## 2016-04-02 MED ORDER — FENTANYL CITRATE (PF) 100 MCG/2ML IJ SOLN
25.0000 ug | INTRAMUSCULAR | Status: DC | PRN
  Administered 2016-04-02: 50 ug via INTRAVENOUS

## 2016-04-02 SURGICAL SUPPLY — 18 items
BAG URINE DRAINAGE (UROLOGICAL SUPPLIES) ×3 IMPLANT
BAG URO CATCHER STRL LF (MISCELLANEOUS) ×3 IMPLANT
CATH FOLEY 3WAY 30CC 20FR (CATHETERS) ×3 IMPLANT
ELECT BIVAP BIPO 22/24 DONUT (ELECTROSURGICAL) ×3
ELECT REM PT RETURN 9FT ADLT (ELECTROSURGICAL) ×3
ELECTRD BIVAP BIPO 22/24 DONUT (ELECTROSURGICAL) ×1 IMPLANT
ELECTRODE REM PT RTRN 9FT ADLT (ELECTROSURGICAL) ×1 IMPLANT
EVACUATOR MICROVAS BLADDER (UROLOGICAL SUPPLIES) ×3 IMPLANT
GLOVE BIOGEL M STRL SZ7.5 (GLOVE) ×3 IMPLANT
GOWN STRL REUS W/TWL XL LVL3 (GOWN DISPOSABLE) ×3 IMPLANT
HOLDER FOLEY CATH W/STRAP (MISCELLANEOUS) ×3 IMPLANT
LOOP CUT BIPOLAR 24F LRG (ELECTROSURGICAL) ×3 IMPLANT
MANIFOLD NEPTUNE II (INSTRUMENTS) ×3 IMPLANT
PACK CYSTO (CUSTOM PROCEDURE TRAY) ×3 IMPLANT
SYR 30ML LL (SYRINGE) ×3 IMPLANT
SYRINGE IRR TOOMEY STRL 70CC (SYRINGE) ×3 IMPLANT
TUBING CONNECTING 10 (TUBING) ×2 IMPLANT
TUBING CONNECTING 10' (TUBING) ×1

## 2016-04-02 NOTE — Progress Notes (Signed)
Spoke with pt during rounds he asked for a PCP referral. PCP appointment for pt with Dr. Anitra Lauth with [facility] at [facility] on Oct. 2 at 2:00 PM, pt need to arrival at 1:45 PM.

## 2016-04-02 NOTE — Progress Notes (Signed)
Patient currently in [facility] for cystoscopy. Consent form signed. CHG bath performed x 1 on this shift prior to transfer to OR. FC in place and draining with no complications. CBI stopped by MD- urine continues to be clear and pink with no clots present. All valuables left in patient's room and jewelry removed.

## 2016-04-02 NOTE — Transfer of Care (Signed)
Immediate Anesthesia Transfer of Care Note  Patient: Shane [name]  Procedure(s) Performed: Procedure(s): CYSTOSCOPY, TRANSURETHRAL RESECTION OF BLADDER TUMOR (N/A)  Patient Location: PACU  Anesthesia Type:General  Level of Consciousness: awake, alert , oriented and patient cooperative  Airway & Oxygen Therapy: Patient Spontanous Breathing and Patient connected to face mask oxygen  Post-op Assessment: Report given to RN, Post -op Vital signs reviewed and stable and Patient moving all extremities  Post vital signs: Reviewed and stable  Last Vitals:  Vitals:   04/01/16 2153 04/02/16 0542  BP: 129/67 132/77  Pulse: 69 62  Resp: 20 18  Temp: 37.6 C 36.9 C    Last Pain:  Vitals:   04/02/16 1123  TempSrc:   PainSc: 0-No pain      Patients Stated Pain Goal: 2 (29/09/03 0149)  Complications: No apparent anesthesia complications

## 2016-04-02 NOTE — Discharge Instructions (Signed)
Cystoscopy, Care After Refer to this sheet in the next few weeks. These instructions provide you with information on caring for yourself after your procedure. Your caregiver may also give you more specific instructions. Your treatment has been planned according to current medical practices, but problems sometimes occur. Call your caregiver if you have any problems or questions after your procedure. HOME CARE INSTRUCTIONS  Things you can do to ease any discomfort after your procedure include:  Drinking enough water and fluids to keep your urine clear or pale yellow.  Taking a warm bath to relieve any burning feelings. SEEK IMMEDIATE MEDICAL CARE IF:   You have an increase in blood in your urine.  You notice blood clots in your urine.  You have difficulty passing urine.  You have the chills.  You have abdominal pain.  You have a fever or persistent symptoms for more than 2-3 days.  You have a fever and your symptoms suddenly get worse. MAKE SURE YOU:   Understand these instructions.  Will watch your condition.  Will get help right away if you are not doing well or get worse.   This information is not intended to replace advice given to you by your health care provider. Make sure you discuss any questions you have with your health care provider.   Document Released: 01/17/2005 Document Revised: 07/21/2014 Document Reviewed: 12/22/2011 Elsevier Interactive Patient Education 2016 [place], Adult A Foley catheter is a soft, flexible tube that is placed into the bladder to drain urine. A Foley catheter may be inserted if:  You leak urine or are not able to control when you urinate (urinary incontinence).  You are not able to urinate when you need to (urinary retention).  You had prostate surgery or surgery on the genitals.  You have certain medical conditions, such as multiple sclerosis, dementia, or a spinal cord injury. If you are going home with  a Foley catheter in place, follow the instructions below. TAKING CARE OF THE CATHETER 1. Wash your hands with soap and water. 2. Using mild soap and warm water on a clean washcloth:  Clean the area on your body closest to the catheter insertion site using a circular motion, moving away from the catheter. Never wipe toward the catheter because this could sweep bacteria up into the urethra and cause infection.  Remove all traces of soap. Pat the area dry with a clean towel. For males, reposition the foreskin. 3. Attach the catheter to your leg so there is no tension on the catheter. Use adhesive tape or a leg strap. If you are using adhesive tape, remove any sticky residue left behind by the previous tape you used. 4. Keep the drainage bag below the level of the bladder, but keep it off the floor. 5. Check throughout the day to be sure the catheter is working and urine is draining freely. Make sure the tubing does not become kinked. 6. Do not pull on the catheter or try to remove it. Pulling could damage internal tissues. TAKING CARE OF THE DRAINAGE BAGS You will be given two drainage bags to take home. One is a large overnight drainage bag, and the other is a smaller leg bag that fits underneath clothing. You may wear the overnight bag at any time, but you should never wear the smaller leg bag at night. Follow the instructions below for how to empty, change, and clean your drainage bags. Emptying the Drainage Bag You must empty your  drainage bag when it is  - full or at least 2-3 times a day. 1. Wash your hands with soap and water. 2. Keep the drainage bag below your hips, below the level of your bladder. This stops urine from going back into the tubing and into your bladder. 3. Hold the dirty bag over the toilet or a clean container. 4. Open the pour spout at the bottom of the bag and empty the urine into the toilet or container. Do not let the pour spout touch the toilet, container, or any other  surface. Doing so can place bacteria on the bag, which can cause an infection. 5. Clean the pour spout with a gauze pad or cotton ball that has rubbing alcohol on it. 6. Close the pour spout. 7. Attach the bag to your leg with adhesive tape or a leg strap. 8. Wash your hands well. Changing the Drainage Bag Change your drainage bag once a month or sooner if it starts to smell bad or look dirty. Below are steps to follow when changing the drainage bag. 1. Wash your hands with soap and water. 2. Pinch off the rubber catheter so that urine does not spill out. 3. Disconnect the catheter tube from the drainage tube at the connection valve. Do not let the tubes touch any surface. 4. Clean the end of the catheter tube with an alcohol wipe. Use a different alcohol wipe to clean the end of the drainage tube. 5. Connect the catheter tube to the drainage tube of the clean drainage bag. 6. Attach the new bag to the leg with adhesive tape or a leg strap. Avoid attaching the new bag too tightly. 7. Wash your hands well. Cleaning the Drainage Bag 1. Wash your hands with soap and water. 2. Wash the bag in warm, soapy water. 3. Rinse the bag thoroughly with warm water. 4. Fill the bag with a solution of white vinegar and water (1 cup vinegar to 1 qt warm water [.2 L vinegar to 1 L warm water]). Close the bag and soak it for 30 minutes in the solution. 5. Rinse the bag with warm water. 6. Hang the bag to dry with the pour spout open and hanging downward. 7. Store the clean bag (once it is dry) in a clean plastic bag. 8. Wash your hands well. PREVENTING INFECTION  Wash your hands before and after handling your catheter.  Take showers daily and wash the area where the catheter enters your body. Do not take baths. Replace wet leg straps with dry ones, if this applies.  Do not use powders, sprays, or lotions on the genital area. Only use creams, lotions, or ointments as directed by your caregiver.  For  females, wipe from front to back after each bowel movement.  Drink enough fluids to keep your urine clear or pale yellow unless you have a fluid restriction.  Do not let the drainage bag or tubing touch or lie on the floor.  Wear cotton underwear to absorb moisture and to keep your skin drier. SEEK MEDICAL CARE IF:   Your urine is cloudy or smells unusually bad.  Your catheter becomes clogged.  You are not draining urine into the bag or your bladder feels full.  Your catheter starts to leak. SEEK IMMEDIATE MEDICAL CARE IF:   You have pain, swelling, redness, or pus where the catheter enters the body.  You have pain in the abdomen, legs, lower back, or bladder.  You have a fever.  You  see blood fill the catheter, or your urine is pink or red.  You have nausea, vomiting, or chills.  Your catheter gets pulled out. MAKE SURE YOU:   Understand these instructions.  Will watch your condition.  Will get help right away if you are not doing well or get worse.   This information is not intended to replace advice given to you by your health care provider. Make sure you discuss any questions you have with your health care provider.   Document Released: 06/30/2005 Document Revised: 11/14/2013 Document Reviewed: 06/21/2012 Elsevier Interactive Patient Education [employer].

## 2016-04-02 NOTE — Consult Note (Signed)
Urology Consult Note   Requesting Attending Physician:  Janece Canterbury, MD Service Providing Consult: Urology Consulting Attending: Junious Silk, MD  Assessment:  Patient is a 80 y.o. male with history of DM, HTN presents with gross hematuria and clot retention. Admitted 03/31/16, catheter exchanged to 22Fr 3-way hematuria catheter on 04/01/16 with initiation of CBI. ~55g prostate noted on CT scan. CT non-con shows large intravesical clot burden. CT with IV contrast after clot irrigation appears to show left bladder wall asymmetry.  Interval: AFVSS. On CBI - urine clear on light drip this morning. Urine culture pending. CT with IV contrast appears to show left wall asymmetry. Minimal clot removed on irrigation this morning. CBI clamped off.   Recommendations: 1. Continue Foley catheter to straight drainage. CBI clamped off by urology this morning. OK to disconnect and ambulate. Encourage ambulation/OOB. We will plan to take to OR today for cysto with TURBT vs bladder biopsy based on CT findings - please keep NPO  2. Flush/irrigate catheter PRN 3. Continue flomax 0.4mg  qd 4. Follow up urine culture  5. Consider oxybutynin 5mg  TID PRN or belladonna-opiate suppositories for bladder spasms if he continues to have significant penile pain likely 2/2 catheter/bladder spasm  Thank you for this consult. Please contact the urology consult pager with any further questions/concerns. Sharmaine Base, MD Urology Surgical Resident  Patient seen and examined on rounds. I discussed with Dr. Lenord Fellers and agree with his findings and plan. I reviewed CT images and drew pt a picture of the findings and we discussed the nature, potential benefits, risks and alternatives to TURBT, including side effects of the proposed treatment, the likelihood of the patient achieving the goals of the procedure, and any potential problems that might occur during the procedure or recuperation. We also discussed sometimes need for a staged  procedure/re-TUR in a few weeks. We discussed post-op foley and risk of bleeding and bladder perf among others. All questions answered. Patient elects to proceed. If he is stable post-op and not on CBI, he could likely go home this afternoon. TURBT is often done as an outpt procedure.    ______________________________________________________   Subjective Had a good night. Some penile pain at tip of penis. No other real pain. Eating/drinking fine prior to 12am. No n/v.   Objective   Vital signs in last 24 hours: BP 132/77 (BP Location: Left Arm)   Pulse 62   Temp 98.4 F (36.9 C) (Oral)   Resp 18   Ht 5\' 8"  (1.727 m)   Wt 210 lb (95.3 kg)   SpO2 99%   BMI 31.93 kg/m   Intake/Output last 3 shifts: I/O last 3 completed shifts: In: 20258.8 [P.O.:830; I.V.:1428.8; Other:18000] Out: 21040 [Urine:21040]  Physical Exam General: NAD, A&O, resting, appropriate HEENT: [place]/AT, EOMI, MMM Pulmonary: Normal work of breathing on RA Cardiovascular: Regular rate & rhythm, HDS, adequate peripheral perfusion Abdomen: soft, NTTP, nondistended, no suprapubic tenderness GU: 22Fr 3-way catheter draining clear urine on slow drip CBI this morning, minimal clot removed on irrigation DRE: deferred Extremities: warm and well perfused Neuro: Appropriate, no focal neurological deficits  Most Recent Labs: Lab Results  Component Value Date   WBC 17.1 (H) 04/01/2016   HGB 12.3 (L) 04/01/2016   HCT 35.9 (L) 04/01/2016   PLT 234 04/01/2016    Lab Results  Component Value Date   NA 140 04/01/2016   K 3.7 04/01/2016   CL 109 04/01/2016   CO2 22 04/01/2016   BUN 19 04/01/2016   CREATININE 0.89  04/01/2016   CALCIUM 8.9 04/01/2016    Lab Results  Component Value Date   ALKPHOS 74 03/31/2016   BILITOT 0.5 03/31/2016   PROT 7.4 03/31/2016   ALBUMIN 3.9 03/31/2016   ALT 21 03/31/2016   AST 20 03/31/2016    Lab Results  Component Value Date   INR 1.09 04/01/2016   APTT 25 04/01/2016      Urine Culture: Pending   IMAGING: Ct Abdomen Pelvis Wo Contrast  Result Date: 03/31/2016 CLINICAL DATA:  Gross hematuria. EXAM: CT ABDOMEN AND PELVIS WITHOUT CONTRAST TECHNIQUE: Multidetector CT imaging of the abdomen and pelvis was performed following the standard protocol without IV contrast. COMPARISON:  None. FINDINGS: Lower chest: The lung bases are clear of acute process. No pleural effusion or pulmonary lesions. The heart is normal in size. No pericardial effusion. Three-vessel coronary artery calcifications are noted along with aortic calcifications. The distal esophagus and aorta are unremarkable. Hepatobiliary: No focal hepatic lesions or intrahepatic biliary dilatation. The gallbladder is normal. No common bile duct dilatation. Pancreas: Marked atrophy of the pancreas. Multiple calcifications in the pancreatic head and body consistent with prior pancreatitis. There is an 18 mm exophytic cyst with peripheral calcifications extending off the pancreatic head which is likely a benign postinflammatory cyst. No worrisome imaging features. Spleen: Normal size.  No focal lesions. Adrenals/Urinary Tract: Small right adrenal gland calcifications. The left adrenal gland is normal. Bilateral parapelvic renal cysts. No renal or obstructing ureteral calculi. No definite bladder calculi. No worrisome renal lesions.  Renal artery calcifications are noted. There is a Foley catheter in the bladder. There is a large hematoma in the bladder. No definite asymmetric wall thickening or discrete mass but there certainly could be a bladder lesion is obscured by the blood. The prostate gland and seminal vesicles are unremarkable except for prostate gland calcifications. Stomach/Bowel: The stomach, duodenum, small bowel and colon are grossly normal without oral contrast. No inflammatory changes, mass lesions or obstructive findings. The terminal ileum and appendix are normal. Vascular/Lymphatic: Dense aortic  calcifications but no focal aneurysm. Tortuosity and calcification of the iliac vessels. No mesenteric or retroperitoneal mass or adenopathy. Small scattered lymph nodes are noted. Reproductive: The prostate gland and seminal vesicles are unremarkable. Other: No pelvic mass or adenopathy. No free pelvic fluid collections. No inguinal mass or adenopathy. There is a periumbilical abdominal wall hernia containing fat. Musculoskeletal: No significant bony findings. Advanced degenerative changes involving the lower lumbar spine with degenerative anterolisthesis of L4 and advanced disc disease and facet disease at L4-5 and L5-S1. IMPRESSION: 1. Large bladder hematoma. No obvious asymmetric bladder wall thickening or bladder mass but one could certainly be obscured by the large hematoma. No renal, ureteral or bladder calculi. 2. Markedly atrophied pancreas with calcifications suggesting prior pancreatitis. 3. Simple appearing pancreatic head cyst. Followup abdominal CT scan without and with contrast (if possible) in 6 months is suggested to document stability. 4. Advanced atherosclerotic calcifications involving the aorta and branch vessels. 5. Periumbilical abdominal wall hernia containing fat. Electronically Signed   By: Marijo Sanes M.D.   On: 03/31/2016 19:27   Ct Abdomen Pelvis W Contrast  Result Date: 04/01/2016 CLINICAL DATA:  Gross hematuria. Hypertension and diabetes. Bladder hematoma. EXAM: CT ABDOMEN AND PELVIS WITH CONTRAST TECHNIQUE: Multidetector CT imaging of the abdomen and pelvis was performed using the standard protocol following bolus administration of intravenous contrast. CONTRAST:  144mL ISOVUE-300 IOPAMIDOL (ISOVUE-300) INJECTION 61% COMPARISON:  03/31/2016 FINDINGS: Lower chest: Cylindrical bronchiectasis in both lower lobes.  Coronary and descending aortic atherosclerotic calcification. Calcification of the mitral valve and aortic valve. Hepatobiliary: Mildly prominent gallbladder at 4.8 cm  diameter. Otherwise unremarkable Pancreas: Chronic calcific pancreatitis. Severe atrophy of much of the pancreatic tail. Cystic lesion in the pancreatic head, 2.0 by 1.3 cm, with calcifications along its posterior margin. The pancreatic head and uncinate process appears more in prominence the rest of the pancreas in terms of soft tissue density although this is of uncertain significance. Spleen: Unremarkable Adrenals/Urinary Tract: Calcification along the medial limb right adrenal gland, likely incidental. Peripelvic cysts. Foley catheter is present in the urinary bladder. There is filling defect in the contrast column above the Foley catheter probably from hematoma. There is also wall thickening of the left side of the urinary bladder, with density values which do not appreciably change between portal venous and delayed phase images. Accordingly this could represent hematoma rather than necessarily being tumor. Stomach/Bowel: Unremarkable Vascular/Lymphatic: Aortoiliac atherosclerotic vascular disease. No pathologic adenopathy. Reproductive: Calcifications in the central zone of the prostate gland. Other: No supplemental non-categorized findings. Musculoskeletal: Grade 1 anterolisthesis at L4-5. Loss of disc height at L4-5 and L5-S1 with left foraminal stenosis at L4-5. Umbilical hernia contains adipose tissue. IMPRESSION: 1. Abnormal wall thickening along the left side of the urinary bladder, but without definite enhancement in these tissues. Accordingly I am not certain that this is due to tumor, any could be due to adherent hematoma. That said, this probably requires further workup in the nonacute setting, probably by cystoscopy. 2. Cylindrical bronchiectasis in the lower lobes. 3. Atherosclerosis including the coronary arteries. 4. Atrophy of most of the pancreas with evidence of chronic calcific pancreatitis. 5. Cystic lesion of the pancreatic head. I do not see a definite enhancing component although there  calcifications along the posterior margin which may simply be parenchymal calcifications associated with the chronic calcific pancreatitis. 2.0 cm in long axis. Follow up for a lesions of this size in this age group can either be by followup imaging by pancreatic protocol CT or MRI in 6 months, or endoscopic ultrasound with FNA. This recommendation follows ACR consensus guidelines: Management of Incidental Pancreatic Cysts: A White Paper of the ACR Incidental Findings Committee. J Am Coll Radiol 3748;27:078-675. 6. There is some fullness of the uncinate process of the pancreas compared to the pancreatic body. I am uncertain of the significance of this, but this could also be followed up at surveillance imaging. 7. Left foraminal stenosis at L4-5 due to spondylosis and degenerative disc disease. Electronically Signed   By: Van Clines M.D.   On: 04/01/2016 18:41

## 2016-04-02 NOTE — Progress Notes (Signed)
Reviewed discharge instructions with patient, spouse, and daughter. Catheter care reinforced. No questions or concerns at this time. Patient to make f/u appointment with urologist, number provided. Patient escorted out via wheelchair with family and NT.

## 2016-04-02 NOTE — Discharge Summary (Signed)
Physician Discharge Summary  Shane [name] FXT:[id number] DOB: Dec 09, 1935 DOA: 03/31/2016  PCP: No primary care provider on file.  Admit date: 03/31/2016 Discharge date: 04/02/2016  Admitted From: home  Disposition:  home  Recommendations for Outpatient Follow-up:  1. Follow up with Dr. Junious Silk in 1 week to review pathology 2. Please obtain BMP/CBC in one week 3. PCP:  Please order MRI pancreatic protocol in 6 months  Home Health:  none  Equipment/Devices:  Foley   Discharge Condition:  Stable, improved CODE STATUS:  full  Diet recommendation:  diabetic   Brief/Interim Summary:  Tyronne Blann is an 80 y.o. male past medical history significant for hypertension diabetes mellitus who presents to the ED with hematuria and difficulty urinating he denied dysuria or recent trauma. The patient state he went to urgent care as he has difficulty urinating and sudden onset of hematuria, he relates this happened last year. CT scan of the abdomen and pelvis showed large bladder hematoma, a simple appearing pancreatic head cyst wishing to follow-up ct in 6 months.  Discharge Diagnoses:  Active Problems:   Hematuria   Hypertension   Diabetes mellitus without complication (HCC)   UTI (urinary tract infection)   Hematoma of bladder wall  Dysuria and Hematuria/possible UTI.  He was given a dose of fosfomycin for possible UTI.  CT scan showed large bladder hematoma and urology was consulted.  Patient underwent continuous bladder irrigation with good results.  He underwent cystoscopy on 9/20 which demonstrated a bladder mass which was biopsied and partially resected.  He will continue his foley catheter and follow up with urology in one week to review the results of his pathology and determine what additional steps may need to be take to treat his mass.  He was also started on oxybutynin and flomax per Urology.  His aspirin was discontinued for now.   Essetial Hypertension, stable, continued lisinopril.     Diabetes mellitus without complication ([place]), G9J 6.5.  Resumed home meds at discharge.    Pancreatic head cyst:  Follow-up CT or MRI pancreas in 6 months.  Leukocytosis, possibly due to UTI vs. Blood loss or underlying malignancy.  F/u as outpatient with PCP in 1-2 weeks.  Discharge Instructions  Discharge Instructions    Call MD for:  difficulty breathing, headache or visual disturbances    Complete by:  As directed    Call MD for:  extreme fatigue    Complete by:  As directed    Call MD for:  hives    Complete by:  As directed    Call MD for:  persistant dizziness or light-headedness    Complete by:  As directed    Call MD for:  persistant nausea and vomiting    Complete by:  As directed    Call MD for:  severe uncontrolled pain    Complete by:  As directed    Call MD for:  temperature >100.4    Complete by:  As directed    Diet Carb Modified    Complete by:  As directed    Discharge instructions    Complete by:  As directed    Please continue your catheter until you follow up with Dr. Junious Silk in a week.   Increase activity slowly    Complete by:  As directed        Medication List    STOP taking these medications   aspirin EC 325 MG tablet     TAKE these medications   ferrous  sulfate 325 (65 FE) MG EC tablet Take 1 tablet (325 mg total) by mouth daily with breakfast.   lisinopril 20 MG tablet Commonly known as:  PRINIVIL,ZESTRIL Take 20 mg by mouth 2 (two) times daily.   omega-3 acid ethyl esters 1 g capsule Commonly known as:  LOVAZA Take 1 g by mouth daily.   pravastatin 20 MG tablet Commonly known as:  PRAVACHOL Take 20 mg by mouth daily.   sitaGLIPtin-metformin 50-1000 MG tablet Commonly known as:  JANUMET Take 1 tablet by mouth 2 (two) times daily with a meal.   tamsulosin 0.4 MG Caps capsule Commonly known as:  FLOMAX Take 1 capsule (0.4 mg total) by mouth daily after supper.      Follow-up Information    Tammi Sou, MD Follow  up on 04/14/2016.   Specialty:  Family Medicine Why:  Appointment at 2:00 PM with Dr. Anitra Lauth at [facility], [place]. Please arrive 15 mins early (1:45). Take all medications that you are taking, insurance card, and ID. Contact information: [address] [place] [address] [phone]          No Known Allergies  Consultations: Dr. Junious Silk  Procedures/Studies: Ct Abdomen Pelvis Wo Contrast  Result Date: 03/31/2016 CLINICAL DATA:  Gross hematuria. EXAM: CT ABDOMEN AND PELVIS WITHOUT CONTRAST TECHNIQUE: Multidetector CT imaging of the abdomen and pelvis was performed following the standard protocol without IV contrast. COMPARISON:  None. FINDINGS: Lower chest: The lung bases are clear of acute process. No pleural effusion or pulmonary lesions. The heart is normal in size. No pericardial effusion. Three-vessel coronary artery calcifications are noted along with aortic calcifications. The distal esophagus and aorta are unremarkable. Hepatobiliary: No focal hepatic lesions or intrahepatic biliary dilatation. The gallbladder is normal. No common bile duct dilatation. Pancreas: Marked atrophy of the pancreas. Multiple calcifications in the pancreatic head and body consistent with prior pancreatitis. There is an 18 mm exophytic cyst with peripheral calcifications extending off the pancreatic head which is likely a benign postinflammatory cyst. No worrisome imaging features. Spleen: Normal size.  No focal lesions. Adrenals/Urinary Tract: Small right adrenal gland calcifications. The left adrenal gland is normal. Bilateral parapelvic renal cysts. No renal or obstructing ureteral calculi. No definite bladder calculi. No worrisome renal lesions.  Renal artery calcifications are noted. There is a Foley catheter in the bladder. There is a large hematoma in the bladder. No definite asymmetric wall thickening or discrete mass but there certainly could be a bladder lesion is obscured by the  blood. The prostate gland and seminal vesicles are unremarkable except for prostate gland calcifications. Stomach/Bowel: The stomach, duodenum, small bowel and colon are grossly normal without oral contrast. No inflammatory changes, mass lesions or obstructive findings. The terminal ileum and appendix are normal. Vascular/Lymphatic: Dense aortic calcifications but no focal aneurysm. Tortuosity and calcification of the iliac vessels. No mesenteric or retroperitoneal mass or adenopathy. Small scattered lymph nodes are noted. Reproductive: The prostate gland and seminal vesicles are unremarkable. Other: No pelvic mass or adenopathy. No free pelvic fluid collections. No inguinal mass or adenopathy. There is a periumbilical abdominal wall hernia containing fat. Musculoskeletal: No significant bony findings. Advanced degenerative changes involving the lower lumbar spine with degenerative anterolisthesis of L4 and advanced disc disease and facet disease at L4-5 and L5-S1. IMPRESSION: 1. Large bladder hematoma. No obvious asymmetric bladder wall thickening or bladder mass but one could certainly be obscured by the large hematoma. No renal, ureteral or bladder calculi. 2. Markedly  atrophied pancreas with calcifications suggesting prior pancreatitis. 3. Simple appearing pancreatic head cyst. Followup abdominal CT scan without and with contrast (if possible) in 6 months is suggested to document stability. 4. Advanced atherosclerotic calcifications involving the aorta and branch vessels. 5. Periumbilical abdominal wall hernia containing fat. Electronically Signed   By: Marijo Sanes M.D.   On: 03/31/2016 19:27   Ct Abdomen Pelvis W Contrast  Result Date: 04/01/2016 CLINICAL DATA:  Gross hematuria. Hypertension and diabetes. Bladder hematoma. EXAM: CT ABDOMEN AND PELVIS WITH CONTRAST TECHNIQUE: Multidetector CT imaging of the abdomen and pelvis was performed using the standard protocol following bolus administration of  intravenous contrast. CONTRAST:  190mL ISOVUE-300 IOPAMIDOL (ISOVUE-300) INJECTION 61% COMPARISON:  03/31/2016 FINDINGS: Lower chest: Cylindrical bronchiectasis in both lower lobes. Coronary and descending aortic atherosclerotic calcification. Calcification of the mitral valve and aortic valve. Hepatobiliary: Mildly prominent gallbladder at 4.8 cm diameter. Otherwise unremarkable Pancreas: Chronic calcific pancreatitis. Severe atrophy of much of the pancreatic tail. Cystic lesion in the pancreatic head, 2.0 by 1.3 cm, with calcifications along its posterior margin. The pancreatic head and uncinate process appears more in prominence the rest of the pancreas in terms of soft tissue density although this is of uncertain significance. Spleen: Unremarkable Adrenals/Urinary Tract: Calcification along the medial limb right adrenal gland, likely incidental. Peripelvic cysts. Foley catheter is present in the urinary bladder. There is filling defect in the contrast column above the Foley catheter probably from hematoma. There is also wall thickening of the left side of the urinary bladder, with density values which do not appreciably change between portal venous and delayed phase images. Accordingly this could represent hematoma rather than necessarily being tumor. Stomach/Bowel: Unremarkable Vascular/Lymphatic: Aortoiliac atherosclerotic vascular disease. No pathologic adenopathy. Reproductive: Calcifications in the central zone of the prostate gland. Other: No supplemental non-categorized findings. Musculoskeletal: Grade 1 anterolisthesis at L4-5. Loss of disc height at L4-5 and L5-S1 with left foraminal stenosis at L4-5. Umbilical hernia contains adipose tissue. IMPRESSION: 1. Abnormal wall thickening along the left side of the urinary bladder, but without definite enhancement in these tissues. Accordingly I am not certain that this is due to tumor, any could be due to adherent hematoma. That said, this probably requires  further workup in the nonacute setting, probably by cystoscopy. 2. Cylindrical bronchiectasis in the lower lobes. 3. Atherosclerosis including the coronary arteries. 4. Atrophy of most of the pancreas with evidence of chronic calcific pancreatitis. 5. Cystic lesion of the pancreatic head. I do not see a definite enhancing component although there calcifications along the posterior margin which may simply be parenchymal calcifications associated with the chronic calcific pancreatitis. 2.0 cm in long axis. Follow up for a lesions of this size in this age group can either be by followup imaging by pancreatic protocol CT or MRI in 6 months, or endoscopic ultrasound with FNA. This recommendation follows ACR consensus guidelines: Management of Incidental Pancreatic Cysts: A White Paper of the ACR Incidental Findings Committee. J Am Coll Radiol 2836;62:947-654. 6. There is some fullness of the uncinate process of the pancreas compared to the pancreatic body. I am uncertain of the significance of this, but this could also be followed up at surveillance imaging. 7. Left foraminal stenosis at L4-5 due to spondylosis and degenerative disc disease. Electronically Signed   By: Van Clines M.D.   On: 04/01/2016 18:41   Cystoscopy and biopsy on 9/20   Subjective: Decreasing bladder pressure and pain.  Urine is a little clearer.  Denies fevers, chills,  nausea.    Discharge Exam: Vitals:   04/02/16 1500 04/02/16 1520  BP: 135/79 (!) 150/84  Pulse: 75 75  Resp: 17 16  Temp: 99 F (37.2 C) 98.4 F (36.9 C)   Vitals:   04/02/16 1430 04/02/16 1445 04/02/16 1500 04/02/16 1520  BP: (!) 152/77 (!) 142/78 135/79 (!) 150/84  Pulse: 76 76 75 75  Resp: 18 11 17 16   Temp:   99 F (37.2 C) 98.4 F (36.9 C)  TempSrc:      SpO2: 100% 100% 100% 99%  Weight:      Height:        General: Pt is alert, awake, not in acute distress Cardiovascular: RRR, 2/6 systolic murmur, no rubs, no gallops Respiratory: CTA  bilaterally, no wheezing, no rhonchi Abdominal: Soft, NT, ND, bowel sounds + Extremities: no edema, no cyanosis    The results of significant diagnostics from this hospitalization (including imaging, microbiology, ancillary and laboratory) are listed below for reference.     Microbiology: No results found for this or any previous visit (from the past 240 hour(s)).   Labs: BNP (last 3 results) No results for input(s): BNP in the last 8760 hours. Basic Metabolic Panel:  Recent Labs Lab 03/31/16 1817 04/01/16 0258  NA 140 140  K 3.7 3.7  CL 107 109  CO2 20* 22  GLUCOSE 157* 170*  BUN 20 19  CREATININE 0.98 0.89  CALCIUM 9.3 8.9   Liver Function Tests:  Recent Labs Lab 03/31/16 1817  AST 20  ALT 21  ALKPHOS 74  BILITOT 0.5  PROT 7.4  ALBUMIN 3.9   No results for input(s): LIPASE, AMYLASE in the last 168 hours. No results for input(s): AMMONIA in the last 168 hours. CBC:  Recent Labs Lab 03/31/16 1817 04/01/16 0258  WBC 16.2* 17.1*  NEUTROABS 12.6*  --   HGB 13.6 12.3*  HCT 39.5 35.9*  MCV 89.6 87.3  PLT 254 234   Cardiac Enzymes: No results for input(s): CKTOTAL, CKMB, CKMBINDEX, TROPONINI in the last 168 hours. BNP: Invalid input(s): POCBNP CBG:  Recent Labs Lab 04/01/16 2150 04/02/16 0736 04/02/16 1137 04/02/16 1429 04/02/16 1658  GLUCAP 214* 134* 120* 146* 122*   D-Dimer No results for input(s): DDIMER in the last 72 hours. Hgb A1c  Recent Labs  04/01/16 0258  HGBA1C 6.5*   Lipid Profile No results for input(s): CHOL, HDL, LDLCALC, TRIG, CHOLHDL, LDLDIRECT in the last 72 hours. Thyroid function studies No results for input(s): TSH, T4TOTAL, T3FREE, THYROIDAB in the last 72 hours.  Invalid input(s): FREET3 Anemia work up No results for input(s): VITAMINB12, FOLATE, FERRITIN, TIBC, IRON, RETICCTPCT in the last 72 hours. Urinalysis    Component Value Date/Time   COLORURINE RED (A) 03/31/2016 1820   APPEARANCEUR CLOUDY (A)  03/31/2016 1820   LABSPEC 1.029 03/31/2016 1820   PHURINE 6.5 03/31/2016 1820   GLUCOSEU NEGATIVE 03/31/2016 1820   HGBUR LARGE (A) 03/31/2016 1820   BILIRUBINUR NEGATIVE 03/31/2016 1820   KETONESUR 15 (A) 03/31/2016 1820   PROTEINUR >300 (A) 03/31/2016 1820   NITRITE NEGATIVE 03/31/2016 1820   LEUKOCYTESUR SMALL (A) 03/31/2016 1820   Sepsis Labs Invalid input(s): PROCALCITONIN,  WBC,  LACTICIDVEN   Time coordinating discharge: Over 30 minutes  SIGNED:   Janece Canterbury, MD  [facility] 04/02/2016, 5:52 PM Pager   If 7PM-7AM, please contact night-coverage [email or web address] Password [email or web address]

## 2016-04-02 NOTE — Anesthesia Procedure Notes (Signed)
Procedure Name: Intubation Date/Time: 04/02/2016 12:39 PM Performed by: Carleene Cooper A Pre-anesthesia Checklist: Patient identified, Emergency Drugs available, Suction available, Patient being monitored and Timeout performed Patient Re-evaluated:Patient Re-evaluated prior to inductionOxygen Delivery Method: Circle system utilized Preoxygenation: Pre-oxygenation with 100% oxygen Intubation Type: IV induction Ventilation: Mask ventilation without difficulty Laryngoscope Size: Mac and 4 Grade View: Grade I Tube type: Oral Tube size: 7.5 mm Number of attempts: 2 Airway Equipment and Method: Stylet Placement Confirmation: ETT inserted through vocal cords under direct vision,  breath sounds checked- equal and bilateral and positive ETCO2 Secured at: 22 cm Tube secured with: Tape Dental Injury: Teeth and Oropharynx as per pre-operative assessment

## 2016-04-02 NOTE — Op Note (Signed)
Preoperative diagnosis: Gross hematuria, bladder neoplasm uncertain behavior Postoperative diagnosis: Same  Procedure: TURBT greater than 5 cm  Surgeon: Junious Silk  Anesthesia: Gen.  Indication for procedure: 80 year old gross hematuria and left bladder wall thickening that appeared to enhance comparing noncontrast and contrasted CT scan. He was brought for cystoscopy and TURBT.  Findings: On exam under anesthesia the bladder was mobile on bimanual exam I didn't palpate any bladder mass. The prostate was approximately 30 g and smooth without hard area or nodule. The penis was uncircumcised and the foreskin was normal without mass lesion or phimosis. The penis was palpably normal. The testicles were descended bilaterally and palpably normal.  On cystoscopy the urethra and the prostatic urethra were unremarkable. The trigone and ureteral orifices were in their normal orthotopic position with orange efflux consistent with prior Pyridium. There was a small amount of residual clot in the bladder which was evacuated. There was an irregular broad bladder mass the left anterior bladder wall which came down to the left lateral and toward the bladder neck. The middle of this area exhibited necrosis and hemorrhage and clot. The remainder of the bladder showed moderate trabeculation but no other lesions. There were no stones or foreign bodies in the bladder. The mass was resected and extensively fulgurated. Hemostasis was excellent at the end of the case. The resection was by far incomplete and this appears to be at least a T2 lesion. He certainly may need a repeat resection if path is equivocal.    Description of procedure: After consent was obtained patient brought to the operating room. After adequate anesthesia he is placed in lithotomy position. I did an exam under anesthesia. He was prepped and draped in the usual sterile fashion. A timeout was performed to confirm the patient and procedure. The cystoscope  was passed per urethra and the bladder inspected with 30 and 70 lens. I then placed the large loop and resected the tumor and tried to gauge the depth based on where the tumor started superiorly. The tumor appeared high-grade and very vascular. After resecting the tumor it was difficult to fulgurated with the loop in the button was placed. I extensively fulgurated the tumor and there was excellent hemostasis at low-pressure. I would estimate a 6-7 cm length of the resection and 5 cm width. Placed a 20 French three-way Foley and put 10 mL in the balloon and seated at the bladder neck. Irrigation was started for wake up with clear irrigation. He was awakened and taken to the recovery room in stable condition. The inflow and outflow irrigation was about 3 L positive but some of the irrigation ran on the table and the floor as the continuous-flow device was inside the blue draped for much of the case as I had to push the resectoscope handle in to get it up and back to the tumor. I also had to hold some SP pressure to get to the tumor. It was not an easy resection.   Complications: None  EBL: 100 mL (including clot that was evacuated)  Specimens to pathology: Bladder tumor chips  Drains: 20 French three-way Foley catheter

## 2016-04-02 NOTE — Anesthesia Preprocedure Evaluation (Addendum)
Anesthesia Evaluation  Patient identified by MRN, date of birth, ID band Patient awake    Reviewed: Allergy & Precautions, NPO status , Patient's Chart, lab work & pertinent test results  Airway Mallampati: II  TM Distance: >3 FB Neck ROM: Full    Dental  (+) Edentulous Upper, Edentulous Lower   Pulmonary neg pulmonary ROS,    Pulmonary exam normal        Cardiovascular hypertension, Pt. on medications Normal cardiovascular exam     Neuro/Psych negative neurological ROS  negative psych ROS   GI/Hepatic negative GI ROS, Neg liver ROS,   Endo/Other  diabetes, Type 2  Renal/GU negative Renal ROS  negative genitourinary   Musculoskeletal negative musculoskeletal ROS (+)   Abdominal   Peds negative pediatric ROS (+)  Hematology negative hematology ROS (+)   Anesthesia Other Findings   Reproductive/Obstetrics negative OB ROS                            Anesthesia Physical Anesthesia Plan  ASA: III  Anesthesia Plan: General   Post-op Pain Management:    Induction:   Airway Management Planned: LMA  Additional Equipment:   Intra-op Plan:   Post-operative Plan: Extubation in OR  Informed Consent: I have reviewed the patients History and Physical, chart, labs and discussed the procedure including the risks, benefits and alternatives for the proposed anesthesia with the patient or authorized representative who has indicated his/her understanding and acceptance.   Dental advisory given  Plan Discussed with:   Anesthesia Plan Comments:         Anesthesia Quick Evaluation

## 2016-04-02 NOTE — Anesthesia Postprocedure Evaluation (Signed)
Anesthesia Post Note  Patient: Shane [name]  Procedure(s) Performed: Procedure(s) (LRB): CYSTOSCOPY, TRANSURETHRAL RESECTION OF BLADDER TUMOR (N/A)  Patient location during evaluation: PACU Anesthesia Type: General Level of consciousness: awake and alert Pain management: pain level controlled Vital Signs Assessment: post-procedure vital signs reviewed and stable Respiratory status: spontaneous breathing, nonlabored ventilation, respiratory function stable and patient connected to nasal cannula oxygen Cardiovascular status: blood pressure returned to baseline and stable Postop Assessment: no signs of nausea or vomiting Anesthetic complications: no    Last Vitals:  Vitals:   04/02/16 1430 04/02/16 1445  BP: (!) 152/77 (!) 142/78  Pulse: 76 76  Resp: 18 11  Temp:      Last Pain:  Vitals:   04/02/16 1123  TempSrc:   PainSc: 0-No pain                 Reginal Lutes

## 2016-04-03 LAB — URINE CULTURE: Culture: NO GROWTH

## 2016-04-11 ENCOUNTER — Other Ambulatory Visit: Payer: Self-pay | Admitting: Urology

## 2016-04-11 ENCOUNTER — Ambulatory Visit (HOSPITAL_COMMUNITY)
Admission: RE | Admit: 2016-04-11 | Discharge: 2016-04-11 | Disposition: A | Discharge status: Home / Self Care | Payer: Medicare Other | Source: Ambulatory Visit | Attending: Urology | Admitting: Urology

## 2016-04-11 DIAGNOSIS — J449 Chronic obstructive pulmonary disease, unspecified: Secondary | ICD-10-CM | POA: Insufficient documentation

## 2016-04-11 DIAGNOSIS — C672 Malignant neoplasm of lateral wall of bladder: Secondary | ICD-10-CM | POA: Diagnosis not present

## 2016-04-14 ENCOUNTER — Encounter: Payer: Self-pay | Admitting: Family Medicine

## 2016-04-14 ENCOUNTER — Ambulatory Visit (INDEPENDENT_AMBULATORY_CARE_PROVIDER_SITE_OTHER): Payer: Medicare Other | Admitting: Family Medicine

## 2016-04-14 VITALS — BP 101/61 | HR 71 | Temp 98.7°F | Resp 18 | Ht 67.5 in | Wt 208.0 lb

## 2016-04-14 DIAGNOSIS — D72828 Other elevated white blood cell count: Secondary | ICD-10-CM

## 2016-04-14 DIAGNOSIS — R3915 Urgency of urination: Secondary | ICD-10-CM

## 2016-04-14 DIAGNOSIS — R300 Dysuria: Secondary | ICD-10-CM

## 2016-04-14 LAB — POCT URINALYSIS DIPSTICK
Bilirubin, UA: NEGATIVE
Glucose, UA: NEGATIVE
Ketones, UA: NEGATIVE
Nitrite, UA: NEGATIVE
Spec Grav, UA: 1.02
Urobilinogen, UA: 0.2
pH, UA: 5.5

## 2016-04-14 LAB — CBC WITH DIFFERENTIAL/PLATELET
Basophils Absolute: 0.1 10*3/uL (ref 0.0–0.1)
Basophils Relative: 0.5 % (ref 0.0–3.0)
Eosinophils Absolute: 0.1 10*3/uL (ref 0.0–0.7)
Eosinophils Relative: 0.8 % (ref 0.0–5.0)
HCT: 34.8 % — ABNORMAL LOW (ref 39.0–52.0)
Hemoglobin: 11.7 g/dL — ABNORMAL LOW (ref 13.0–17.0)
Lymphocytes Relative: 20.8 % (ref 12.0–46.0)
Lymphs Abs: 2.8 10*3/uL (ref 0.7–4.0)
MCHC: 33.7 g/dL (ref 30.0–36.0)
MCV: 89.9 fl (ref 78.0–100.0)
Monocytes Absolute: 0.8 10*3/uL (ref 0.1–1.0)
Monocytes Relative: 6.2 % (ref 3.0–12.0)
Neutro Abs: 9.7 10*3/uL — ABNORMAL HIGH (ref 1.4–7.7)
Neutrophils Relative %: 71.7 % (ref 43.0–77.0)
Platelets: 350 10*3/uL (ref 150.0–400.0)
RBC: 3.87 Mil/uL — ABNORMAL LOW (ref 4.22–5.81)
RDW: 12.9 % (ref 11.5–15.5)
WBC: 13.5 10*3/uL — ABNORMAL HIGH (ref 4.0–10.5)

## 2016-04-14 MED ORDER — TAMSULOSIN HCL 0.4 MG PO CAPS: 0.4000 mg | ORAL_CAPSULE | Freq: Every day | ORAL | 3 refills | Status: DC

## 2016-04-14 MED ORDER — OMEGA-3-ACID ETHYL ESTERS 1 G PO CAPS: 1.0000 g | ORAL_CAPSULE | Freq: Every day | ORAL | 3 refills | Status: DC

## 2016-04-14 MED ORDER — PRAVASTATIN SODIUM 20 MG PO TABS: 20.0000 mg | ORAL_TABLET | Freq: Every day | ORAL | 3 refills | Status: DC

## 2016-04-14 MED ORDER — SITAGLIPTIN PHOS-METFORMIN HCL 50-1000 MG PO TABS: 1.0000 | ORAL_TABLET | Freq: Two times a day (BID) | ORAL | 3 refills | Status: DC

## 2016-04-14 MED ORDER — LISINOPRIL 20 MG PO TABS: 20.0000 mg | ORAL_TABLET | Freq: Two times a day (BID) | ORAL | 3 refills | Status: DC

## 2016-04-14 NOTE — Progress Notes (Signed)
Pre visit review using our clinic review tool, if applicable. No additional management support is needed unless otherwise documented below in the visit note. 

## 2016-04-14 NOTE — Progress Notes (Signed)
Office Note 04/14/2016  CC:  Chief Complaint  Patient presents with  . Establish Care    HPI:  Shane [name] is a 80 y.o. White male who is here to establish care. Patient's most recent primary MD: MD in [place], [place]. Old records in EPIC/[facility] EMR were reviewed prior to or during today's visit.  Pt just relocated to [place], [place] from [place].  Lives with daughter and her family.  He was admitted to [facility] 9/20 for gross hematuria and difficulty urinating, was found to have a large bladder hematoma and bladder mass.  He was d/c'd home with a foley catheter in and had f/u with urology to review results of pathology.  He was also d/c'd home on oxybutynin and flomax per urology.  His aspirin was discontinued for the time being. He had leukocytosis that could possibly have been due to UTI, vs volume contraction, vs underlying malignancy: needs repeat today.  His Hba1c in hosp was 6.5%.  Currently c/o mild dysuria and urinary urgency since getting his catheter out (got catheter out 4 d/a). No gross hematuria. Per pt, he was dx'd with bladder cancer that was partially invading the bladder wall.  He has been referred to oncologist and a general surgeon.  Plan is for cystectomy with neobladder construction.  Past Medical History:  Diagnosis Date  . Bladder cancer ([place]) 03/2016   cystoscopy + bx and partial resection of tumor in hospital (when admitted for gross hematuria and urinary retention).  . Diabetes mellitus without complication ([place])   . Hypertension   . Pancreas cyst 03/2016   Repeat imaging with MRI pancrease protocol 09/2016    Past Surgical History:  Procedure Laterality Date  . CATARACT EXTRACTION    . TONSILLECTOMY AND ADENOIDECTOMY  Remote  . TRANSURETHRAL RESECTION OF PROSTATE N/A 04/02/2016   Procedure: CYSTOSCOPY, TRANSURETHRAL RESECTION OF BLADDER TUMOR;  Surgeon: [name], MD;  Location: [facility];  Service: Urology;  Laterality: N/A;    Family History   Problem Relation Age of Onset  . Diabetes Mellitus I Mother   . Diabetes Mellitus I Sister     Social History   Social History  . Marital status: Married    Spouse name: N/A  . Number of children: N/A  . Years of education: N/A   Occupational History  . Not on file.   Social History Main Topics  . Smoking status: Former Smoker    Years: 3.00  . Smokeless tobacco: Never Used  . Alcohol use 0.6 oz/week    1 Cans of beer per week     Comment: rarely  . Drug use: No  . Sexual activity: Not on file   Other Topics Concern  . Not on file   Social History Narrative   Married, 3 children, 7 grandchildren.   Occupation: retired Engineer, site.   No tob, rare alc.       Outpatient Encounter Prescriptions as of 04/14/2016  Medication Sig  . ferrous sulfate 325 (65 FE) MG EC tablet Take 1 tablet (325 mg total) by mouth daily with breakfast.  . omega-3 acid ethyl esters (LOVAZA) 1 g capsule Take 1 capsule (1 g total) by mouth daily.  . pravastatin (PRAVACHOL) 20 MG tablet Take 1 tablet (20 mg total) by mouth daily.  . sitaGLIPtin-metformin (JANUMET) 50-1000 MG tablet Take 1 tablet by mouth 2 (two) times daily with a meal.  . tamsulosin (FLOMAX) 0.4 MG CAPS capsule Take 1 capsule (0.4 mg total) by mouth daily after supper.  . [  DISCONTINUED] omega-3 acid ethyl esters (LOVAZA) 1 g capsule Take 1 g by mouth daily.  . [DISCONTINUED] pravastatin (PRAVACHOL) 20 MG tablet Take 20 mg by mouth daily.   . [DISCONTINUED] sitaGLIPtin-metformin (JANUMET) 50-1000 MG tablet Take 1 tablet by mouth 2 (two) times daily with a meal.  . [DISCONTINUED] tamsulosin (FLOMAX) 0.4 MG CAPS capsule Take 1 capsule (0.4 mg total) by mouth daily after supper.  [name] lisinopril (PRINIVIL,ZESTRIL) 20 MG tablet Take 1 tablet (20 mg total) by mouth 2 (two) times daily.  . [DISCONTINUED] lisinopril (PRINIVIL,ZESTRIL) 20 MG tablet Take 20 mg by mouth 2 (two) times daily.    No facility-administered encounter medications on file as  of 04/14/2016.     No Known Allergies  ROS Review of Systems  Constitutional: Negative for fatigue and fever.  HENT: Negative for congestion and sore throat.   Eyes: Negative for visual disturbance.  Respiratory: Negative for cough.   Cardiovascular: Negative for chest pain.  Gastrointestinal: Negative for abdominal pain and nausea.  Genitourinary: Positive for dysuria and urgency.  Musculoskeletal: Negative for back pain and joint swelling.  Skin: Negative for rash.  Neurological: Negative for weakness and headaches.  Hematological: Negative for adenopathy.    PE; Blood pressure 101/61, pulse 71, temperature 98.7 F (37.1 C), temperature source Oral, resp. rate 18, height 5' 7.5" (1.715 m), weight 208 lb (94.3 kg), SpO2 98 %. Gen: Alert, well appearing.  Patient is oriented to person, place, time, and situation. AFFECT: pleasant, lucid thought and speech. JHE:RDEY: no injection, icteris, swelling, or exudate.  EOMI, PERRLA. Mouth: lips without lesion/swelling.  Oral mucosa pink and moist. Oropharynx without erythema, exudate, or swelling.  CV: RRR, 8-1/4 systolic murmur, S1 and S2 normal, no diastolic murmur. No rub or gallop. Chest is clear, no wheezing or rales. Normal symmetric air entry throughout both lung fields. No chest wall deformities or tenderness. ABD: soft, NT, ND, BS normal.  No hepatospenomegaly or mass.  No bruits. EXT: 1+ pitting edema bilat from ankles to pretibial level.  Pertinent labs:   Lab Results  Component Value Date   WBC 17.1 (H) 04/01/2016   HGB 12.3 (L) 04/01/2016   HCT 35.9 (L) 04/01/2016   MCV 87.3 04/01/2016   PLT 234 04/01/2016   Lab Results  Component Value Date   CREATININE 0.89 04/01/2016   BUN 19 04/01/2016   NA 140 04/01/2016   K 3.7 04/01/2016   CL 109 04/01/2016   CO2 22 04/01/2016   Lab Results  Component Value Date   ALT 21 03/31/2016   AST 20 03/31/2016   ALKPHOS 74 03/31/2016   BILITOT 0.5 03/31/2016   Lab Results   Component Value Date   HGBA1C 6.5 (H) 04/01/2016   CC UA today: large blood, 30 mg/dl pro, LEU small, otherwise normal.  ASSESSMENT AND PLAN:   New pt; will obtain prior PCP's records.  1) Bladder cancer: he'll f/u with urologist as appropriate plus he'll be getting initial consultations with general surgeon and oncology soon.  2) Dysuria/urinary urgency: s/p catheter removal 4 d/a.  Likely from urethral irritation, but will send urine for c/s today.  3) DM 2: good control.  Recent A1c 6.5%.  He does no home monitoring. Continue diet.  He is only mildly active but is independent in ADL's.  4) Leukocytosis: while in hosp recently.  Recheck CBC w/ diff today.  5) Pancreatic cyst: will plan on f/u MRI pancreas protocol 09/2016 as per radiologist's recommendations.  6) HTN: The current  medical regimen is effective;  continue present plan and medications. Lytes/cr good about 2 wks ago while in hosp.  7) Hyperlipidemia, mixed: tolerating statin and lovaza.  Continue both.  He is not fasting today. Will try to get fasting lipid panel at next f/u if fasting.  An After Visit Summary was printed and given to the patient.  Return in about 3 months (around 07/15/2016) for routine chronic illness f/u.  Signed:  Crissie Sickles, MD           04/14/2016

## 2016-04-15 ENCOUNTER — Encounter: Payer: Self-pay | Admitting: Oncology

## 2016-04-15 LAB — URINE CULTURE: Organism ID, Bacteria: NO GROWTH

## 2016-05-01 ENCOUNTER — Telehealth: Payer: Self-pay | Admitting: Oncology

## 2016-05-01 ENCOUNTER — Ambulatory Visit (HOSPITAL_BASED_OUTPATIENT_CLINIC_OR_DEPARTMENT_OTHER): Payer: Medicare Other | Admitting: Oncology

## 2016-05-01 VITALS — BP 149/76 | HR 77 | Temp 98.6°F | Resp 17 | Ht 67.5 in | Wt 204.7 lb

## 2016-05-01 DIAGNOSIS — E119 Type 2 diabetes mellitus without complications: Secondary | ICD-10-CM | POA: Diagnosis not present

## 2016-05-01 DIAGNOSIS — C679 Malignant neoplasm of bladder, unspecified: Secondary | ICD-10-CM | POA: Diagnosis not present

## 2016-05-01 DIAGNOSIS — R319 Hematuria, unspecified: Secondary | ICD-10-CM | POA: Diagnosis not present

## 2016-05-01 DIAGNOSIS — I10 Essential (primary) hypertension: Secondary | ICD-10-CM

## 2016-05-01 DIAGNOSIS — C675 Malignant neoplasm of bladder neck: Secondary | ICD-10-CM

## 2016-05-01 MED ORDER — LIDOCAINE-PRILOCAINE 2.5-2.5 % EX CREA: 1.0000 "application " | TOPICAL_CREAM | CUTANEOUS | 0 refills | Status: DC | PRN

## 2016-05-01 MED ORDER — PROCHLORPERAZINE MALEATE 10 MG PO TABS: 10.0000 mg | ORAL_TABLET | Freq: Four times a day (QID) | ORAL | 0 refills | Status: DC | PRN

## 2016-05-01 NOTE — Progress Notes (Signed)
Reason for Referral: Bladder cancer.   HPI: 80 year old gentleman native of [place] and have recently relocated here to be with his daughter and the [place] area. He is a gentleman with diabetes and mild hypertension who started developing hematuria and that was subsequently evaluated by Dr. Junious Silk. He presented on 918 with gross hematuria and CT scan with contrast obtained showed a thickening of the left bladder wall concerning for bladder malignancy. He underwent a TURBT on 04/02/2016. The pathology showed a high grade urothelial carcinoma with invasion into the muscularis propria. The tumor was found to be greater than 5 cm and tolerated the procedure well. His imaging studies did not show any evidence of metastatic disease or adenopathy. He did have a pancreatic cysts and repeat MRI was recommended in March 2018. He was evaluated by Dr. Junious Silk and recommended to undergo radical cystectomy after neoadjuvant chemotherapy. He is here to discuss these options today. Clinically he feels reasonably well and does not have any urinary symptoms. He does not report any pelvic pain or abdominal pain. He does not report any flank pain or hematuria. His appetite remains reasonable although he had intentionally lost close to 40 pounds. He remains reasonably active and attends to activities of daily living.  He does not report any headaches, blurry vision, syncope or seizures. He does not report any fevers or chills or sweats. He does not report any cough, wheezing or hemoptysis. He does not report any nausea, vomiting or abdominal pain. He does not report any constipation, diarrhea or change in his bowel habits. He does not report any frequency, urgency or hesitancy. He does not report any skeletal complaints of arthralgias or myalgias. The remaining review of systems unremarkable.   Past Medical History:  Diagnosis Date  . Bladder cancer ([place]) 03/2016   cystoscopy + bx and partial resection of tumor in  hospital (when admitted for gross hematuria and urinary retention).  . Chronic calcific pancreatitis ([place]) 03/2016   CT: also small cyst in head of pancreas that needs 6 mo f/u imaging.  . Diabetes mellitus without complication ([place])   . Hypertension   . Pancreas cyst 03/2016   Repeat imaging with MRI pancrease protocol 09/2016  :  Past Surgical History:  Procedure Laterality Date  . CATARACT EXTRACTION    . TONSILLECTOMY AND ADENOIDECTOMY  Remote  . TRANSURETHRAL RESECTION OF PROSTATE N/A 04/02/2016   Procedure: CYSTOSCOPY, TRANSURETHRAL RESECTION OF BLADDER TUMOR;  Surgeon: [name], MD;  Location: [facility];  Service: Urology;  Laterality: N/A;  :   Current Outpatient Prescriptions:  .  ferrous sulfate 325 (65 FE) MG EC tablet, Take 1 tablet (325 mg total) by mouth daily with breakfast., Disp: 30 tablet, Rfl: 0 .  omega-3 acid ethyl esters (LOVAZA) 1 g capsule, Take 1 capsule (1 g total) by mouth daily., Disp: 90 capsule, Rfl: 3 .  pravastatin (PRAVACHOL) 20 MG tablet, Take 1 tablet (20 mg total) by mouth daily., Disp: 90 tablet, Rfl: 3 .  sitaGLIPtin-metformin (JANUMET) 50-1000 MG tablet, Take 1 tablet by mouth 2 (two) times daily with a meal., Disp: 180 tablet, Rfl: 3 .  tamsulosin (FLOMAX) 0.4 MG CAPS capsule, Take 1 capsule (0.4 mg total) by mouth daily after supper., Disp: 90 capsule, Rfl: 3 .  lidocaine-prilocaine (EMLA) cream, Apply 1 application topically as needed. Apply to port before chemotherapy., Disp: 30 g, Rfl: 0 .  prochlorperazine (COMPAZINE) 10 MG tablet, Take 1 tablet (10 mg total) by mouth every 6 (six) hours as  needed for nausea or vomiting., Disp: 30 tablet, Rfl: 0:  No Known Allergies:  Family History  Problem Relation Age of Onset  . Diabetes Mellitus I Mother   . Diabetes Mellitus I Sister   :  Social History   Social History  . Marital status: Married    Spouse name: N/A  . Number of children: N/A  . Years of education: N/A   Occupational  History  . Not on file.   Social History Main Topics  . Smoking status: Former Smoker    Years: 3.00  . Smokeless tobacco: Never Used  . Alcohol use 0.6 oz/week    1 Cans of beer per week     Comment: rarely  . Drug use: No  . Sexual activity: Not on file   Other Topics Concern  . Not on file   Social History Narrative   Married, 3 children, 7 grandchildren.   Occupation: retired Engineer, site.   No tob, rare alc.     :  Pertinent items are noted in HPI.  Exam: Blood pressure (!) 149/76, pulse 77, temperature 98.6 F (37 C), temperature source Oral, resp. rate 17, height 5' 7.5" (1.715 m), weight 204 lb 11.2 oz (92.9 kg), SpO2 99 %. General appearance: alert and cooperative Head: Normocephalic, without obvious abnormality Throat: lips, mucosa, and tongue normal; teeth and gums normal Neck: no adenopathy Back: negative Resp: clear to auscultation bilaterally Chest wall: no tenderness Cardio: regular rate and rhythm, S1, S2 normal, no murmur, click, rub or gallop GI: soft, non-tender; bowel sounds normal; no masses,  no organomegaly Extremities: extremities normal, atraumatic, no cyanosis or edema Pulses: 2+ and symmetric Lymph nodes: Cervical, supraclavicular, and axillary nodes normal.  CBC    Component Value Date/Time   WBC 13.5 (H) 04/14/2016 1442   RBC 3.87 (L) 04/14/2016 1442   HGB 11.7 (L) 04/14/2016 1442   HCT 34.8 (L) 04/14/2016 1442   PLT 350.0 04/14/2016 1442   MCV 89.9 04/14/2016 1442   MCH 29.9 04/01/2016 0258   MCHC 33.7 04/14/2016 1442   RDW 12.9 04/14/2016 1442   LYMPHSABS 2.8 04/14/2016 1442   MONOABS 0.8 04/14/2016 1442   EOSABS 0.1 04/14/2016 1442   BASOSABS 0.1 04/14/2016 1442      Chemistry      Component Value Date/Time   NA 140 04/01/2016 0258   K 3.7 04/01/2016 0258   CL 109 04/01/2016 0258   CO2 22 04/01/2016 0258   BUN 19 04/01/2016 0258   CREATININE 0.89 04/01/2016 0258      Component Value Date/Time   CALCIUM 8.9 04/01/2016 0258    ALKPHOS 74 03/31/2016 1817   AST 20 03/31/2016 1817   ALT 21 03/31/2016 1817   BILITOT 0.5 03/31/2016 1817      Dg Chest 2 View  Result Date: 04/11/2016 CLINICAL DATA:  Malignant neoplasm of the lateral wall of the urinary bladder. History of diabetes and hypertension. Nonsmoker. EXAM: CHEST  2 VIEW COMPARISON:  None FINDINGS: There is emphysematous hyperinflation of the lungs with attenuated pulmonary vessels. No pneumonic consolidation, masslike opacity, effusion nor CHF. The thoracic aorta is slightly tortuous without aneurysm. The cardiac and mediastinal contours appear unremarkable. No acute fracture nor bone destruction. IMPRESSION: COPD without acute pulmonary disease. No findings of metastatic disease. Electronically Signed   By: Ashley Royalty M.D.   On: 04/11/2016 16:44   Ct Abdomen Pelvis W Contrast  Result Date: 04/01/2016 CLINICAL DATA:  Gross hematuria. Hypertension and diabetes. Bladder hematoma. EXAM:  CT ABDOMEN AND PELVIS WITH CONTRAST TECHNIQUE: Multidetector CT imaging of the abdomen and pelvis was performed using the standard protocol following bolus administration of intravenous contrast. CONTRAST:  138mL ISOVUE-300 IOPAMIDOL (ISOVUE-300) INJECTION 61% COMPARISON:  03/31/2016 FINDINGS: Lower chest: Cylindrical bronchiectasis in both lower lobes. Coronary and descending aortic atherosclerotic calcification. Calcification of the mitral valve and aortic valve. Hepatobiliary: Mildly prominent gallbladder at 4.8 cm diameter. Otherwise unremarkable Pancreas: Chronic calcific pancreatitis. Severe atrophy of much of the pancreatic tail. Cystic lesion in the pancreatic head, 2.0 by 1.3 cm, with calcifications along its posterior margin. The pancreatic head and uncinate process appears more in prominence the rest of the pancreas in terms of soft tissue density although this is of uncertain significance. Spleen: Unremarkable Adrenals/Urinary Tract: Calcification along the medial limb right  adrenal gland, likely incidental. Peripelvic cysts. Foley catheter is present in the urinary bladder. There is filling defect in the contrast column above the Foley catheter probably from hematoma. There is also wall thickening of the left side of the urinary bladder, with density values which do not appreciably change between portal venous and delayed phase images. Accordingly this could represent hematoma rather than necessarily being tumor. Stomach/Bowel: Unremarkable Vascular/Lymphatic: Aortoiliac atherosclerotic vascular disease. No pathologic adenopathy. Reproductive: Calcifications in the central zone of the prostate gland. Other: No supplemental non-categorized findings. Musculoskeletal: Grade 1 anterolisthesis at L4-5. Loss of disc height at L4-5 and L5-S1 with left foraminal stenosis at L4-5. Umbilical hernia contains adipose tissue. IMPRESSION: 1. Abnormal wall thickening along the left side of the urinary bladder, but without definite enhancement in these tissues. Accordingly I am not certain that this is due to tumor, any could be due to adherent hematoma. That said, this probably requires further workup in the nonacute setting, probably by cystoscopy. 2. Cylindrical bronchiectasis in the lower lobes. 3. Atherosclerosis including the coronary arteries. 4. Atrophy of most of the pancreas with evidence of chronic calcific pancreatitis. 5. Cystic lesion of the pancreatic head. I do not see a definite enhancing component although there calcifications along the posterior margin which may simply be parenchymal calcifications associated with the chronic calcific pancreatitis. 2.0 cm in long axis. Follow up for a lesions of this size in this age group can either be by followup imaging by pancreatic protocol CT or MRI in 6 months, or endoscopic ultrasound with FNA. This recommendation follows ACR consensus guidelines: Management of Incidental Pancreatic Cysts: A White Paper of the ACR Incidental Findings  Committee. J Am Coll Radiol 0762;26:333-545. 6. There is some fullness of the uncinate process of the pancreas compared to the pancreatic body. I am uncertain of the significance of this, but this could also be followed up at surveillance imaging. 7. Left foraminal stenosis at L4-5 due to spondylosis and degenerative disc disease. Electronically Signed   By: Van Clines M.D.   On: 04/01/2016 18:41    Assessment and Plan:   80 year old gentleman with the following issues:  1. Invasive high-grade urothelial carcinoma of the bladder after presenting with hematuria and difficulty urination in September 2017. He is status post TURBT on 04/02/2016 which confirmed the tumor invades into the muscularis propria. He has no evidence of metastatic disease based on his abdominal imaging as well as chest x-ray.  The natural course of this disease was discussed today and treatment options were reviewed. He remains a reasonable candidate for radical cystectomy to be performed for a curative purposes. Alternative to that would be bladder preserving approach utilizing chemotherapy and radiation. We  also discussed the rationale for using systemic chemotherapy in the adjuvant and a neoadjuvant setting. The advantage of using chemotherapy in the neoadjuvant setting includes better tolerance, ensuring that he receives full doses, among other survival benefits advantage documented in clinical trials.  The logistics of chemotherapy administration was reviewed. He is not on MVAC candidate and he would be a good candidate for gemcitabine and cisplatin. Complications associated with this regimen include nausea, vomiting, myelosuppression, neutropenia, neutropenic sepsis, renal insufficiency as well as possible need for growth factor support. Severe complications such as sepsis, hospitalization, deep vein thrombosis and rarely death have been also noted.  The benefit at this point will improvement in his progression free  survival and overall survival documented previously.  After discussion today he is agreeable to proceed and he will receive gemcitabine and cisplatin on day 1, gemcitabine on day 8 of a 21 day cycle. The plan is to proceed with 3 cycles of therapy via her to surgery. I anticipate he will complete chemotherapy before the end of December.  2. IV access: Risks and benefits of Port-A-Cath insertion was discussed. These would include bleeding, thrombosis or infection. He is agreeable to proceed and EMLA cream will be available to the patient.  3. Antiemetics: Prescription for Compazine made available to the patient today.  4. Renal function prophylaxis: We have discussed strategies to preserve his renal function including increased hydration as well as avoid nephrotoxic drugs. These include NSAIDs among others.  5. DVT prophylaxis: I recommended to increase ambulation and avoid immobility.  6. Diabetes: Blood sugar will be monitored closely while he is receiving premedication with dexamethasone.  7. Follow-up: Anticipate the start of chemotherapy on November 3 after chemotherapy education class.

## 2016-05-01 NOTE — Telephone Encounter (Signed)
Gave patient avs report and appointments for October thru December. Patient also given appointment for port 05/12/2016 @ 1:30 pm to arrive 11:30 am. Spoke with Tiffany in IR - she will add appontment.

## 2016-05-01 NOTE — Progress Notes (Signed)
START ON PATHWAY REGIMEN - Bladder  [id number]: Gemcitabine 1,000 mg/m2 D1, 8 + Cisplatin 70 mg/m2 D1 q21 Days x 4 Cycles   A cycle is every 21 days:     Gemcitabine (Gemzar(R)) 1000 mg/m2 in 250 mL NS IV over 30 minutes days 1 and 8. Dose Mod: None     Cisplatin (Platinol(R)) 70 mg/m2 in 500 mL NS IV. **Prehydrate and consider post-hydration.***  Consider RX for 3 days of bid oral dexamethasone for delayed nausea. Dose Mod: None  **Always confirm dose/schedule in your pharmacy ordering system**    Patient Characteristics: Early Stage Disease, Pre Cystectomy, Clinical T2-T4a, N0-1, M0, Cystectomy Eligible, Chemotherapy Indicated (CrCl >= 50 mL/min and Minimal or No Symptoms) AJCC Stage Grouping: II Current evidence of distant metastases? No AJCC T Stage: 2a AJCC M Stage: 0 AJCC N Stage: 0 Has patient had a Cystectomy? No  Intent of Therapy: Curative Intent, Discussed with Patient

## 2016-05-05 ENCOUNTER — Encounter: Payer: Self-pay | Admitting: Family Medicine

## 2016-05-06 DIAGNOSIS — C672 Malignant neoplasm of lateral wall of bladder: Secondary | ICD-10-CM | POA: Diagnosis not present

## 2016-05-06 DIAGNOSIS — N281 Cyst of kidney, acquired: Secondary | ICD-10-CM | POA: Diagnosis not present

## 2016-05-06 DIAGNOSIS — K429 Umbilical hernia without obstruction or gangrene: Secondary | ICD-10-CM | POA: Diagnosis not present

## 2016-05-08 ENCOUNTER — Other Ambulatory Visit: Payer: Self-pay | Admitting: Radiology

## 2016-05-09 ENCOUNTER — Other Ambulatory Visit: Payer: Self-pay | Admitting: Radiology

## 2016-05-12 ENCOUNTER — Ambulatory Visit (HOSPITAL_COMMUNITY)
Admission: RE | Admit: 2016-05-12 | Discharge: 2016-05-12 | Disposition: A | Discharge status: Home / Self Care | Payer: Medicare Other | Source: Ambulatory Visit | Attending: Oncology | Admitting: Oncology

## 2016-05-12 ENCOUNTER — Other Ambulatory Visit: Payer: Self-pay | Admitting: Oncology

## 2016-05-12 ENCOUNTER — Encounter (HOSPITAL_COMMUNITY): Payer: Self-pay

## 2016-05-12 DIAGNOSIS — I10 Essential (primary) hypertension: Secondary | ICD-10-CM | POA: Diagnosis not present

## 2016-05-12 DIAGNOSIS — C679 Malignant neoplasm of bladder, unspecified: Secondary | ICD-10-CM | POA: Insufficient documentation

## 2016-05-12 DIAGNOSIS — C675 Malignant neoplasm of bladder neck: Secondary | ICD-10-CM

## 2016-05-12 DIAGNOSIS — E119 Type 2 diabetes mellitus without complications: Secondary | ICD-10-CM | POA: Diagnosis not present

## 2016-05-12 DIAGNOSIS — K862 Cyst of pancreas: Secondary | ICD-10-CM | POA: Diagnosis not present

## 2016-05-12 DIAGNOSIS — Z5111 Encounter for antineoplastic chemotherapy: Secondary | ICD-10-CM | POA: Diagnosis not present

## 2016-05-12 HISTORY — PX: IR GENERIC HISTORICAL: IMG1180011

## 2016-05-12 LAB — CBC WITH DIFFERENTIAL/PLATELET
Basophils Absolute: 0 10*3/uL (ref 0.0–0.1)
Basophils Relative: 0 %
Eosinophils Absolute: 0.1 10*3/uL (ref 0.0–0.7)
Eosinophils Relative: 1 %
HCT: 34.3 % — ABNORMAL LOW (ref 39.0–52.0)
Hemoglobin: 11.4 g/dL — ABNORMAL LOW (ref 13.0–17.0)
Lymphocytes Relative: 14 %
Lymphs Abs: 2.3 10*3/uL (ref 0.7–4.0)
MCH: 28.9 pg (ref 26.0–34.0)
MCHC: 33.2 g/dL (ref 30.0–36.0)
MCV: 87.1 fL (ref 78.0–100.0)
Monocytes Absolute: 0.9 10*3/uL (ref 0.1–1.0)
Monocytes Relative: 6 %
Neutro Abs: 12.7 10*3/uL — ABNORMAL HIGH (ref 1.7–7.7)
Neutrophils Relative %: 79 %
Platelets: 354 10*3/uL (ref 150–400)
RBC: 3.94 MIL/uL — ABNORMAL LOW (ref 4.22–5.81)
RDW: 12.7 % (ref 11.5–15.5)
WBC: 16 10*3/uL — ABNORMAL HIGH (ref 4.0–10.5)

## 2016-05-12 LAB — PROTIME-INR
INR: 1.05
Prothrombin Time: 13.8 seconds (ref 11.4–15.2)

## 2016-05-12 LAB — GLUCOSE, CAPILLARY: Glucose-Capillary: 125 mg/dL — ABNORMAL HIGH (ref 65–99)

## 2016-05-12 MED ORDER — LIDOCAINE HCL 1 % IJ SOLN
INTRAMUSCULAR | Status: AC
  Filled 2016-05-12: qty 20

## 2016-05-12 MED ORDER — DIPHENHYDRAMINE HCL 50 MG/ML IJ SOLN
25.0000 mg | Freq: Once | INTRAMUSCULAR | Status: AC
  Administered 2016-05-12: 25 mg via INTRAVENOUS

## 2016-05-12 MED ORDER — MIDAZOLAM HCL 2 MG/2ML IJ SOLN
INTRAMUSCULAR | Status: AC | PRN
  Administered 2016-05-12: 0.5 mg via INTRAVENOUS
  Administered 2016-05-12: 1 mg via INTRAVENOUS

## 2016-05-12 MED ORDER — DIPHENHYDRAMINE HCL 50 MG/ML IJ SOLN
INTRAMUSCULAR | Status: AC
  Filled 2016-05-12: qty 1

## 2016-05-12 MED ORDER — LIDOCAINE-EPINEPHRINE (PF) 2 %-1:200000 IJ SOLN
INTRAMUSCULAR | Status: AC
  Filled 2016-05-12: qty 20

## 2016-05-12 MED ORDER — FENTANYL CITRATE (PF) 100 MCG/2ML IJ SOLN
INTRAMUSCULAR | Status: AC
  Filled 2016-05-12: qty 4

## 2016-05-12 MED ORDER — MIDAZOLAM HCL 2 MG/2ML IJ SOLN
INTRAMUSCULAR | Status: AC
  Filled 2016-05-12: qty 4

## 2016-05-12 MED ORDER — HEPARIN SOD (PORK) LOCK FLUSH 100 UNIT/ML IV SOLN
INTRAVENOUS | Status: AC
  Filled 2016-05-12: qty 5

## 2016-05-12 MED ORDER — CEFAZOLIN SODIUM-DEXTROSE 2-4 GM/100ML-% IV SOLN
2.0000 g | INTRAVENOUS | Status: AC
  Administered 2016-05-12: 2 g via INTRAVENOUS
  Filled 2016-05-12: qty 100

## 2016-05-12 MED ORDER — SODIUM CHLORIDE 0.9 % IV SOLN
INTRAVENOUS | Status: DC
  Administered 2016-05-12: 13:00:00 via INTRAVENOUS

## 2016-05-12 MED ORDER — FENTANYL CITRATE (PF) 100 MCG/2ML IJ SOLN
INTRAMUSCULAR | Status: AC | PRN
  Administered 2016-05-12: 50 ug via INTRAVENOUS

## 2016-05-12 MED ORDER — LIDOCAINE HCL 1 % IJ SOLN
INTRAMUSCULAR | Status: AC | PRN
  Administered 2016-05-12: 15 mL

## 2016-05-12 NOTE — Sedation Documentation (Signed)
Pt noted to have nasal congestion, started to sneeze during the procedure and swelling in his eyes.  Shick MD noted.  Benadryl IVP given

## 2016-05-12 NOTE — Discharge Instructions (Signed)
Moderate Conscious Sedation, Adult, Care After °Refer to this sheet in the next few weeks. These instructions provide you with information on caring for yourself after your procedure. Your health care provider may also give you more specific instructions. Your treatment has been planned according to current medical practices, but problems sometimes occur. Call your health care provider if you have any problems or questions after your procedure. °WHAT TO EXPECT AFTER THE PROCEDURE  °After your procedure: °· You may feel sleepy, clumsy, and have poor balance for several hours. °· Vomiting may occur if you eat too soon after the procedure. °HOME CARE INSTRUCTIONS °· Do not participate in any activities where you could become injured for at least 24 hours. Do not: °¨ Drive. °¨ Swim. °¨ Ride a bicycle. °¨ Operate heavy machinery. °¨ Cook. °¨ Use power tools. °¨ Climb ladders. °¨ Work from a high place. °· Do not make important decisions or sign legal documents until you are improved. °· If you vomit, drink water, juice, or soup when you can drink without vomiting. Make sure you have little or no nausea before eating solid foods. °· Only take over-the-counter or prescription medicines for pain, discomfort, or fever as directed by your health care provider. °· Make sure you and your family fully understand everything about the medicines given to you, including what side effects may occur. °· You should not drink alcohol, take sleeping pills, or take medicines that cause drowsiness for at least 24 hours. °· If you smoke, do not smoke without supervision. °· If you are feeling better, you may resume normal activities 24 hours after you were sedated. °· Keep all appointments with your health care provider. °SEEK MEDICAL CARE IF: °· Your skin is pale or bluish in color. °· You continue to feel nauseous or vomit. °· Your pain is getting worse and is not helped by medicine. °· You have bleeding or swelling. °· You are still  sleepy or feeling clumsy after 24 hours. °SEEK IMMEDIATE MEDICAL CARE IF: °· You develop a rash. °· You have difficulty breathing. °· You develop any type of allergic problem. °· You have a fever. °MAKE SURE YOU: °· Understand these instructions. °· Will watch your condition. °· Will get help right away if you are not doing well or get worse. °  °This information is not intended to replace advice given to you by your health care provider. Make sure you discuss any questions you have with your health care provider. °  °Document Released: 04/20/2013 Document Revised: 07/21/2014 Document Reviewed: 04/20/2013 °Elsevier Interactive Patient Education ©2016 [employer]vier Inc. ° °Implanted Port Insertion, Care After °Refer to this sheet in the next few weeks. These instructions provide you with information on caring for yourself after your procedure. Your health care provider may also give you more specific instructions. Your treatment has been planned according to current medical practices, but problems sometimes occur. Call your health care provider if you have any problems or questions after your procedure. °WHAT TO EXPECT AFTER THE PROCEDURE °After your procedure, it is typical to have the following:  °· Discomfort at the port insertion site. Ice packs to the area will help. °· Bruising on the skin over the port. This will subside in 3-4 days. °HOME CARE INSTRUCTIONS °· After your port is placed, you will get a manufacturer's information card. The card has information about your port. Keep this card with you at all times.   °· Know what kind of port you have. There are many   types of ports available.   °· Wear a medical alert bracelet in case of an emergency. This can help alert health care workers that you have a port.   °· The port can stay in for as long as your health care provider believes it is necessary.   °· A home health care nurse may give medicines and take care of the port.   °· You or a family member can get  special training and directions for giving medicine and taking care of the port at home.   °SEEK MEDICAL CARE IF:  °· Your port does not flush or you are unable to get a blood return.   °· You have a fever or chills. °SEEK IMMEDIATE MEDICAL CARE IF: °· You have new fluid or pus coming from your incision.   °· You notice a bad smell coming from your incision site.   °· You have swelling, pain, or more redness at the incision or port site.   °· You have chest pain or shortness of breath. °  °This information is not intended to replace advice given to you by your health care provider. Make sure you discuss any questions you have with your health care provider. °  °Document Released: 04/20/2013 Document Revised: 07/05/2013 Document Reviewed: 04/20/2013 °Elsevier Interactive Patient Education ©2016 Elsevier Inc. ° °

## 2016-05-12 NOTE — Progress Notes (Signed)
Patient arrived to unit from IR with redness and slight swelling around eyes, forehead and nose.  Symptoms resolved prior to discharge.

## 2016-05-12 NOTE — H&P (Signed)
Chief Complaint: bladder cancer  Referring Physician:Dr. Zola Button  Supervising Physician: Daryll Brod  Patient Status: [facility] - Out-pt  HPI: Shane [name] is an 80 y.o. male who was recently diagnosed with bladder cancer.  He underwent a TURBT by Dr. Junious Silk on 04-02-16.  He is currently being followed by Dr. Alen Blew.  He is scheduled to start chemotherapy this Friday.  He has requested a PAC be placed to help with access during his chemo sessions.  The patient denies any change in his overall medical condition except for some pressure or discomfort when he has to void.  Past Medical History:  Past Medical History:  Diagnosis Date  . Bladder cancer ([place]) 03/2016   Invasive high grade urothelial carcinoma.  No metastatic spread at time of dx.  Cystoscopy + bx and partial resection of tumor in hospital (when admitted for gross hematuria and urinary retention).  Plan for tx is radical cystectomy + neoadj and adjuvant chemo.  . Chronic calcific pancreatitis ([place]) 03/2016   CT: also small cyst in head of pancreas that needs 6 mo f/u imaging (09/2016)  . Diabetes mellitus without complication ([place])   . Hypertension   . Pancreas cyst 03/2016   Repeat imaging with MRI pancrease protocol 09/2016    Past Surgical History:  Past Surgical History:  Procedure Laterality Date  . CATARACT EXTRACTION    . TONSILLECTOMY AND ADENOIDECTOMY  Remote  . TRANSURETHRAL RESECTION OF PROSTATE N/A 04/02/2016   Procedure: CYSTOSCOPY, TRANSURETHRAL RESECTION OF BLADDER TUMOR;  Surgeon: [name], MD;  Location: [facility] ORS;  Service: Urology;  Laterality: N/A;    Family History:  Family History  Problem Relation Age of Onset  . Diabetes Mellitus I Mother   . Diabetes Mellitus I Sister     Social History:  reports that he has quit smoking. He quit after 3.00 years of use. He has never used smokeless tobacco. He reports that he drinks about 0.6 oz of alcohol per week . He reports that he does not use  drugs.  Allergies: No Known Allergies  Medications: Medications reviewed in Epic  Please HPI for pertinent positives, otherwise complete 10 system ROS negative.  Mallampati Score: MD Evaluation Airway: WNL Heart: WNL Abdomen: WNL Chest/ Lungs: WNL ASA  Classification: 2 Mallampati/Airway Score: Two  Physical Exam: BP (!) 158/89   Pulse 74   Temp 99.2 F (37.3 C) (Oral)   Resp 18   SpO2 100%  There is no height or weight on file to calculate BMI. General: pleasant, WD, WN white male who is laying in bed in NAD HEENT: head is normocephalic, atraumatic.  Sclera are noninjected.  PERRL.  Ears and nose without any masses or lesions.  Mouth is pink and moist Heart: regular, rate, and rhythm.  Normal s1,s2. +murmur. No obvious gallops, or rubs noted.  Palpable radial and pedal pulses bilaterally Lungs: CTAB, no wheezes, rhonchi, or rales noted.  Respiratory effort nonlabored Abd: soft, NT, ND, +BS, no masses or organomegaly.  Reducible umbilical hernia Psych: A&Ox3 with an appropriate affect.   Labs: Pending  Imaging: No results found.  Assessment/Plan 1. Bladder cancer -we will plan to proceed with PAC placement today so he can initiate his chemo treatments on Friday -vitals have been reviewed, but labs are pending. -Risks and Benefits discussed with the patient including, but not limited to bleeding, infection, pneumothorax, or fibrin sheath development and need for additional procedures. All of the patient's questions were answered, patient is agreeable to  proceed. Consent signed and in chart.   Thank you for this interesting consult.  I greatly enjoyed meeting Shane [name] and look forward to participating in their care.  A copy of this report was sent to the requesting provider on this date.  Electronically Signed: Henreitta Cea 05/12/2016, 12:05 PM   I spent a total of  30 Minutes   in face to face in clinical consultation, greater than 50% of which was  counseling/coordinating care for bladder cancer

## 2016-05-12 NOTE — Procedures (Signed)
Bladder cancer  S/p RT IJ POWER PORT   Tip svcra No comp Ready for use Full report in PACS

## 2016-05-12 NOTE — Sedation Documentation (Signed)
Pt denies any tongue swelling or diff breathing at this time.

## 2016-05-13 ENCOUNTER — Other Ambulatory Visit: Payer: Medicare Other

## 2016-05-13 ENCOUNTER — Encounter: Payer: Self-pay | Admitting: *Deleted

## 2016-05-16 ENCOUNTER — Other Ambulatory Visit (HOSPITAL_BASED_OUTPATIENT_CLINIC_OR_DEPARTMENT_OTHER): Payer: Medicare Other

## 2016-05-16 ENCOUNTER — Ambulatory Visit (HOSPITAL_BASED_OUTPATIENT_CLINIC_OR_DEPARTMENT_OTHER): Payer: Medicare Other

## 2016-05-16 VITALS — BP 125/83 | HR 75 | Temp 100.0°F | Resp 16

## 2016-05-16 DIAGNOSIS — C679 Malignant neoplasm of bladder, unspecified: Secondary | ICD-10-CM | POA: Diagnosis not present

## 2016-05-16 DIAGNOSIS — C675 Malignant neoplasm of bladder neck: Secondary | ICD-10-CM

## 2016-05-16 DIAGNOSIS — Z5111 Encounter for antineoplastic chemotherapy: Secondary | ICD-10-CM

## 2016-05-16 LAB — CBC WITH DIFFERENTIAL/PLATELET
BASO%: 0.7 % (ref 0.0–2.0)
Basophils Absolute: 0.1 10*3/uL (ref 0.0–0.1)
EOS%: 0.8 % (ref 0.0–7.0)
Eosinophils Absolute: 0.1 10*3/uL (ref 0.0–0.5)
HCT: 33.8 % — ABNORMAL LOW (ref 38.4–49.9)
HGB: 10.9 g/dL — ABNORMAL LOW (ref 13.0–17.1)
LYMPH%: 9.9 % — ABNORMAL LOW (ref 14.0–49.0)
MCH: 28.6 pg (ref 27.2–33.4)
MCHC: 32.2 g/dL (ref 32.0–36.0)
MCV: 89 fL (ref 79.3–98.0)
MONO#: 0.9 10*3/uL (ref 0.1–0.9)
MONO%: 5.3 % (ref 0.0–14.0)
NEUT#: 14.1 10*3/uL — ABNORMAL HIGH (ref 1.5–6.5)
NEUT%: 83.3 % — ABNORMAL HIGH (ref 39.0–75.0)
Platelets: 323 10*3/uL (ref 140–400)
RBC: 3.8 10*6/uL — ABNORMAL LOW (ref 4.20–5.82)
RDW: 13 % (ref 11.0–14.6)
WBC: 16.9 10*3/uL — ABNORMAL HIGH (ref 4.0–10.3)
lymph#: 1.7 10*3/uL (ref 0.9–3.3)

## 2016-05-16 LAB — COMPREHENSIVE METABOLIC PANEL
ALT: 12 U/L (ref 0–55)
AST: 10 U/L (ref 5–34)
Albumin: 2.8 g/dL — ABNORMAL LOW (ref 3.5–5.0)
Alkaline Phosphatase: 92 U/L (ref 40–150)
Anion Gap: 9 mEq/L (ref 3–11)
BUN: 14.1 mg/dL (ref 7.0–26.0)
CO2: 26 mEq/L (ref 22–29)
Calcium: 9.4 mg/dL (ref 8.4–10.4)
Chloride: 103 mEq/L (ref 98–109)
Creatinine: 0.8 mg/dL (ref 0.7–1.3)
EGFR: 85 mL/min/{1.73_m2} — ABNORMAL LOW (ref >90)
Glucose: 215 mg/dl — ABNORMAL HIGH (ref 70–140)
Potassium: 4.2 mEq/L (ref 3.5–5.1)
Sodium: 139 mEq/L (ref 136–145)
Total Bilirubin: 0.31 mg/dL (ref 0.20–1.20)
Total Protein: 7.2 g/dL (ref 6.4–8.3)

## 2016-05-16 MED ORDER — PALONOSETRON HCL INJECTION 0.25 MG/5ML
0.2500 mg | Freq: Once | INTRAVENOUS | Status: AC
  Administered 2016-05-16: 0.25 mg via INTRAVENOUS

## 2016-05-16 MED ORDER — SODIUM CHLORIDE 0.9% FLUSH
10.0000 mL | INTRAVENOUS | Status: DC | PRN
  Administered 2016-05-16: 10 mL
  Filled 2016-05-16: qty 10

## 2016-05-16 MED ORDER — PALONOSETRON HCL INJECTION 0.25 MG/5ML
INTRAVENOUS | Status: AC
  Filled 2016-05-16: qty 5

## 2016-05-16 MED ORDER — HEPARIN SOD (PORK) LOCK FLUSH 100 UNIT/ML IV SOLN
500.0000 [IU] | Freq: Once | INTRAVENOUS | Status: AC | PRN
  Administered 2016-05-16: 500 [IU]
  Filled 2016-05-16: qty 5

## 2016-05-16 MED ORDER — SODIUM CHLORIDE 0.9 % IV SOLN
1000.0000 mg/m2 | Freq: Once | INTRAVENOUS | Status: AC
  Administered 2016-05-16: 2090 mg via INTRAVENOUS
  Filled 2016-05-16: qty 54.97

## 2016-05-16 MED ORDER — CISPLATIN CHEMO INJECTION 100MG/100ML
71.5000 mg/m2 | Freq: Once | INTRAVENOUS | Status: AC
  Administered 2016-05-16: 150 mg via INTRAVENOUS
  Filled 2016-05-16: qty 50

## 2016-05-16 MED ORDER — POTASSIUM CHLORIDE 2 MEQ/ML IV SOLN
Freq: Once | INTRAVENOUS | Status: AC
  Administered 2016-05-16: 09:00:00 via INTRAVENOUS
  Filled 2016-05-16: qty 10

## 2016-05-16 MED ORDER — SODIUM CHLORIDE 0.9 % IV SOLN
Freq: Once | INTRAVENOUS | Status: AC
  Administered 2016-05-16: 12:00:00 via INTRAVENOUS
  Filled 2016-05-16: qty 5

## 2016-05-16 MED ORDER — SODIUM CHLORIDE 0.9 % IV SOLN
Freq: Once | INTRAVENOUS | Status: AC
  Administered 2016-05-16: 09:00:00 via INTRAVENOUS

## 2016-05-16 NOTE — Patient Instructions (Addendum)
[facility] Discharge Instructions for Patients Receiving Chemotherapy  Today you received the following chemotherapy agents Cisplatin and Gemzar  To help prevent nausea and vomiting after your treatment, we encourage you to take your nausea medication as directed. No Zofran for 3 days. Take Compazine instead.    If you develop nausea and vomiting that is not controlled by your nausea medication, call the clinic.   BELOW ARE SYMPTOMS THAT SHOULD BE REPORTED IMMEDIATELY:  *FEVER GREATER THAN 100.5 F  *CHILLS WITH OR WITHOUT FEVER  NAUSEA AND VOMITING THAT IS NOT CONTROLLED WITH YOUR NAUSEA MEDICATION  *UNUSUAL SHORTNESS OF BREATH  *UNUSUAL BRUISING OR BLEEDING  TENDERNESS IN MOUTH AND THROAT WITH OR WITHOUT PRESENCE OF ULCERS  *URINARY PROBLEMS  *BOWEL PROBLEMS  UNUSUAL RASH Items with * indicate a potential emergency and should be followed up as soon as possible.  Feel free to call the clinic you have any questions or concerns. The clinic phone number is [phone].  Please show the [place] at check-in to the Emergency Department and triage nurse.   Cisplatin injection What is this medicine? CISPLATIN (SIS pla tin) is a chemotherapy drug. It targets fast dividing cells, like cancer cells, and causes these cells to die. This medicine is used to treat many types of cancer like bladder, ovarian, and testicular cancers. This medicine may be used for other purposes; ask your health care provider or pharmacist if you have questions. What should I tell my health care provider before I take this medicine? They need to know if you have any of these conditions: -blood disorders -hearing problems -kidney disease -recent or ongoing radiation therapy -an unusual or allergic reaction to cisplatin, carboplatin, other chemotherapy, other medicines, foods, dyes, or preservatives -pregnant or trying to get pregnant -breast-feeding How should I use this  medicine? This drug is given as an infusion into a vein. It is administered in a hospital or clinic by a specially trained health care professional. Talk to your pediatrician regarding the use of this medicine in children. Special care may be needed. Overdosage: If you think you have taken too much of this medicine contact a poison control center or emergency room at once. NOTE: This medicine is only for you. Do not share this medicine with others. What if I miss a dose? It is important not to miss a dose. Call your doctor or health care professional if you are unable to keep an appointment. What may interact with this medicine? -dofetilide -foscarnet -medicines for seizures -medicines to increase blood counts like filgrastim, pegfilgrastim, sargramostim -probenecid -pyridoxine used with altretamine -rituximab -some antibiotics like amikacin, gentamicin, neomycin, polymyxin B, streptomycin, tobramycin -sulfinpyrazone -vaccines -zalcitabine Talk to your doctor or health care professional before taking any of these medicines: -acetaminophen -aspirin -ibuprofen -ketoprofen -naproxen This list may not describe all possible interactions. Give your health care provider a list of all the medicines, herbs, non-prescription drugs, or dietary supplements you use. Also tell them if you smoke, drink alcohol, or use illegal drugs. Some items may interact with your medicine. What should I watch for while using this medicine? Your condition will be monitored carefully while you are receiving this medicine. You will need important blood work done while you are taking this medicine. This drug may make you feel generally unwell. This is not uncommon, as chemotherapy can affect healthy cells as well as cancer cells. Report any side effects. Continue your course of treatment even though you feel ill unless your  doctor tells you to stop. In some cases, you may be given additional medicines to help with side  effects. Follow all directions for their use. Call your doctor or health care professional for advice if you get a fever, chills or sore throat, or other symptoms of a cold or flu. Do not treat yourself. This drug decreases your body's ability to fight infections. Try to avoid being around people who are sick. This medicine may increase your risk to bruise or bleed. Call your doctor or health care professional if you notice any unusual bleeding. Be careful brushing and flossing your teeth or using a toothpick because you may get an infection or bleed more easily. If you have any dental work done, tell your dentist you are receiving this medicine. Avoid taking products that contain aspirin, acetaminophen, ibuprofen, naproxen, or ketoprofen unless instructed by your doctor. These medicines may hide a fever. Do not become pregnant while taking this medicine. Women should inform their doctor if they wish to become pregnant or think they might be pregnant. There is a potential for serious side effects to an unborn child. Talk to your health care professional or pharmacist for more information. Do not breast-feed an infant while taking this medicine. Drink fluids as directed while you are taking this medicine. This will help protect your kidneys. Call your doctor or health care professional if you get diarrhea. Do not treat yourself. What side effects may I notice from receiving this medicine? Side effects that you should report to your doctor or health care professional as soon as possible: -allergic reactions like skin rash, itching or hives, swelling of the face, lips, or tongue -signs of infection - fever or chills, cough, sore throat, pain or difficulty passing urine -signs of decreased platelets or bleeding - bruising, pinpoint red spots on the skin, black, tarry stools, nosebleeds -signs of decreased red blood cells - unusually weak or tired, fainting spells, lightheadedness -breathing  problems -changes in hearing -gout pain -low blood counts - This drug may decrease the number of white blood cells, red blood cells and platelets. You may be at increased risk for infections and bleeding. -nausea and vomiting -pain, swelling, redness or irritation at the injection site -pain, tingling, numbness in the hands or feet -problems with balance, movement -trouble passing urine or change in the amount of urine Side effects that usually do not require medical attention (report to your doctor or health care professional if they continue or are bothersome): -changes in vision -loss of appetite -metallic taste in the mouth or changes in taste This list may not describe all possible side effects. Call your doctor for medical advice about side effects. You may report side effects to FDA at 1-800-FDA-1088. Where should I keep my medicine? This drug is given in a hospital or clinic and will not be stored at home. NOTE: This sheet is a summary. It may not cover all possible information. If you have questions about this medicine, talk to your doctor, pharmacist, or health care provider.    2016, Elsevier/Gold Standard. (2007-10-05 14:40:54)  Gemcitabine injection What is this medicine? GEMCITABINE (jem SIT a been) is a chemotherapy drug. This medicine is used to treat many types of cancer like breast cancer, lung cancer, pancreatic cancer, and ovarian cancer. This medicine may be used for other purposes; ask your health care provider or pharmacist if you have questions. What should I tell my health care provider before I take this medicine? They need to know if  you have any of these conditions: -blood disorders -infection -kidney disease -liver disease -recent or ongoing radiation therapy -an unusual or allergic reaction to gemcitabine, other chemotherapy, other medicines, foods, dyes, or preservatives -pregnant or trying to get pregnant -breast-feeding How should I use this  medicine? This drug is given as an infusion into a vein. It is administered in a hospital or clinic by a specially trained health care professional. Talk to your pediatrician regarding the use of this medicine in children. Special care may be needed. Overdosage: If you think you have taken too much of this medicine contact a poison control center or emergency room at once. NOTE: This medicine is only for you. Do not share this medicine with others. What if I miss a dose? It is important not to miss your dose. Call your doctor or health care professional if you are unable to keep an appointment. What may interact with this medicine? -medicines to increase blood counts like filgrastim, pegfilgrastim, sargramostim -some other chemotherapy drugs like cisplatin -vaccines Talk to your doctor or health care professional before taking any of these medicines: -acetaminophen -aspirin -ibuprofen -ketoprofen -naproxen This list may not describe all possible interactions. Give your health care provider a list of all the medicines, herbs, non-prescription drugs, or dietary supplements you use. Also tell them if you smoke, drink alcohol, or use illegal drugs. Some items may interact with your medicine. What should I watch for while using this medicine? Visit your doctor for checks on your progress. This drug may make you feel generally unwell. This is not uncommon, as chemotherapy can affect healthy cells as well as cancer cells. Report any side effects. Continue your course of treatment even though you feel ill unless your doctor tells you to stop. In some cases, you may be given additional medicines to help with side effects. Follow all directions for their use. Call your doctor or health care professional for advice if you get a fever, chills or sore throat, or other symptoms of a cold or flu. Do not treat yourself. This drug decreases your body's ability to fight infections. Try to avoid being around  people who are sick. This medicine may increase your risk to bruise or bleed. Call your doctor or health care professional if you notice any unusual bleeding. Be careful brushing and flossing your teeth or using a toothpick because you may get an infection or bleed more easily. If you have any dental work done, tell your dentist you are receiving this medicine. Avoid taking products that contain aspirin, acetaminophen, ibuprofen, naproxen, or ketoprofen unless instructed by your doctor. These medicines may hide a fever. Women should inform their doctor if they wish to become pregnant or think they might be pregnant. There is a potential for serious side effects to an unborn child. Talk to your health care professional or pharmacist for more information. Do not breast-feed an infant while taking this medicine. What side effects may I notice from receiving this medicine? Side effects that you should report to your doctor or health care professional as soon as possible: -allergic reactions like skin rash, itching or hives, swelling of the face, lips, or tongue -low blood counts - this medicine may decrease the number of white blood cells, red blood cells and platelets. You may be at increased risk for infections and bleeding. -signs of infection - fever or chills, cough, sore throat, pain or difficulty passing urine -signs of decreased platelets or bleeding - bruising, pinpoint red spots on the  skin, black, tarry stools, blood in the urine -signs of decreased red blood cells - unusually weak or tired, fainting spells, lightheadedness -breathing problems -chest pain -mouth sores -nausea and vomiting -pain, swelling, redness at site where injected -pain, tingling, numbness in the hands or feet -stomach pain -swelling of ankles, feet, hands -unusual bleeding Side effects that usually do not require medical attention (report to your doctor or health care professional if they continue or are  bothersome): -constipation -diarrhea -hair loss -loss of appetite -stomach upset This list may not describe all possible side effects. Call your doctor for medical advice about side effects. You may report side effects to FDA at 1-800-FDA-1088. Where should I keep my medicine? This drug is given in a hospital or clinic and will not be stored at home. NOTE: This sheet is a summary. It may not cover all possible information. If you have questions about this medicine, talk to your doctor, pharmacist, or health care provider.    2016, Elsevier/Gold Standard. (2007-11-09 18:45:54)

## 2016-05-19 ENCOUNTER — Telehealth: Payer: Self-pay | Admitting: Medical Oncology

## 2016-05-19 NOTE — Telephone Encounter (Signed)
Chemo f/u message left.

## 2016-05-21 NOTE — Telephone Encounter (Signed)
Shane [name] returned call for chemotherapy F/U.  Patient is doing well.  Denies n/v.  Denies any new side effects or symptoms.  Bowel and bladder is functioning well.  Eating and drinking well and I instructed to drink 64 oz minimum daily or at least the day before, of and after treatment.  Denies questions at this time and encouraged to call if needed.  Reviewed how to call after hours in the case of an emergency.

## 2016-05-23 ENCOUNTER — Other Ambulatory Visit (HOSPITAL_BASED_OUTPATIENT_CLINIC_OR_DEPARTMENT_OTHER): Payer: Medicare Other

## 2016-05-23 ENCOUNTER — Ambulatory Visit (HOSPITAL_BASED_OUTPATIENT_CLINIC_OR_DEPARTMENT_OTHER): Payer: Medicare Other

## 2016-05-23 ENCOUNTER — Ambulatory Visit (HOSPITAL_BASED_OUTPATIENT_CLINIC_OR_DEPARTMENT_OTHER): Payer: Medicare Other | Admitting: Oncology

## 2016-05-23 VITALS — BP 138/68 | HR 70 | Temp 98.2°F | Resp 18 | Ht 67.5 in | Wt 202.3 lb

## 2016-05-23 DIAGNOSIS — Z5111 Encounter for antineoplastic chemotherapy: Secondary | ICD-10-CM | POA: Diagnosis not present

## 2016-05-23 DIAGNOSIS — C679 Malignant neoplasm of bladder, unspecified: Secondary | ICD-10-CM

## 2016-05-23 DIAGNOSIS — E119 Type 2 diabetes mellitus without complications: Secondary | ICD-10-CM | POA: Diagnosis not present

## 2016-05-23 DIAGNOSIS — C675 Malignant neoplasm of bladder neck: Secondary | ICD-10-CM

## 2016-05-23 LAB — COMPREHENSIVE METABOLIC PANEL
ALT: 18 U/L (ref 0–55)
AST: 14 U/L (ref 5–34)
Albumin: 2.9 g/dL — ABNORMAL LOW (ref 3.5–5.0)
Alkaline Phosphatase: 84 U/L (ref 40–150)
Anion Gap: 10 mEq/L (ref 3–11)
BUN: 21 mg/dL (ref 7.0–26.0)
CO2: 26 mEq/L (ref 22–29)
Calcium: 9.3 mg/dL (ref 8.4–10.4)
Chloride: 102 mEq/L (ref 98–109)
Creatinine: 0.8 mg/dL (ref 0.7–1.3)
EGFR: 86 mL/min/{1.73_m2} — ABNORMAL LOW (ref >90)
Glucose: 135 mg/dl (ref 70–140)
Potassium: 4 mEq/L (ref 3.5–5.1)
Sodium: 138 mEq/L (ref 136–145)
Total Bilirubin: 0.24 mg/dL (ref 0.20–1.20)
Total Protein: 7.1 g/dL (ref 6.4–8.3)

## 2016-05-23 LAB — CBC WITH DIFFERENTIAL/PLATELET
BASO%: 0.6 % (ref 0.0–2.0)
Basophils Absolute: 0 10*3/uL (ref 0.0–0.1)
EOS%: 1.6 % (ref 0.0–7.0)
Eosinophils Absolute: 0.1 10*3/uL (ref 0.0–0.5)
HCT: 31.9 % — ABNORMAL LOW (ref 38.4–49.9)
HGB: 10.6 g/dL — ABNORMAL LOW (ref 13.0–17.1)
LYMPH%: 33.6 % (ref 14.0–49.0)
MCH: 28.7 pg (ref 27.2–33.4)
MCHC: 33.2 g/dL (ref 32.0–36.0)
MCV: 86.4 fL (ref 79.3–98.0)
MONO#: 0.2 10*3/uL (ref 0.1–0.9)
MONO%: 2.6 % (ref 0.0–14.0)
NEUT#: 3.8 10*3/uL (ref 1.5–6.5)
NEUT%: 61.6 % (ref 39.0–75.0)
Platelets: 205 10*3/uL (ref 140–400)
RBC: 3.69 10*6/uL — ABNORMAL LOW (ref 4.20–5.82)
RDW: 12.6 % (ref 11.0–14.6)
WBC: 6.2 10*3/uL (ref 4.0–10.3)
lymph#: 2.1 10*3/uL (ref 0.9–3.3)
nRBC: 0 % (ref 0–0)

## 2016-05-23 MED ORDER — SODIUM CHLORIDE 0.9% FLUSH
10.0000 mL | INTRAVENOUS | Status: DC | PRN
  Administered 2016-05-23: 10 mL
  Filled 2016-05-23: qty 10

## 2016-05-23 MED ORDER — PROCHLORPERAZINE MALEATE 10 MG PO TABS
10.0000 mg | ORAL_TABLET | Freq: Once | ORAL | Status: AC
  Administered 2016-05-23: 10 mg via ORAL

## 2016-05-23 MED ORDER — HEPARIN SOD (PORK) LOCK FLUSH 100 UNIT/ML IV SOLN
500.0000 [IU] | Freq: Once | INTRAVENOUS | Status: AC | PRN
  Administered 2016-05-23: 500 [IU]
  Filled 2016-05-23: qty 5

## 2016-05-23 MED ORDER — SODIUM CHLORIDE 0.9 % IV SOLN
1000.0000 mg/m2 | Freq: Once | INTRAVENOUS | Status: AC
  Administered 2016-05-23: 2090 mg via INTRAVENOUS
  Filled 2016-05-23: qty 55

## 2016-05-23 MED ORDER — PROCHLORPERAZINE MALEATE 10 MG PO TABS
ORAL_TABLET | ORAL | Status: AC
  Filled 2016-05-23: qty 1

## 2016-05-23 MED ORDER — SODIUM CHLORIDE 0.9 % IV SOLN
Freq: Once | INTRAVENOUS | Status: AC
  Administered 2016-05-23: 13:00:00 via INTRAVENOUS

## 2016-05-23 NOTE — Patient Instructions (Signed)
[facility] Discharge Instructions for Patients Receiving Chemotherapy  Today you received the following chemotherapy agents Gemzar.  To help prevent nausea and vomiting after your treatment, we encourage you to take your nausea medication.   If you develop nausea and vomiting that is not controlled by your nausea medication, call the clinic.   BELOW ARE SYMPTOMS THAT SHOULD BE REPORTED IMMEDIATELY:  *FEVER GREATER THAN 100.5 F  *CHILLS WITH OR WITHOUT FEVER  NAUSEA AND VOMITING THAT IS NOT CONTROLLED WITH YOUR NAUSEA MEDICATION  *UNUSUAL SHORTNESS OF BREATH  *UNUSUAL BRUISING OR BLEEDING  TENDERNESS IN MOUTH AND THROAT WITH OR WITHOUT PRESENCE OF ULCERS  *URINARY PROBLEMS  *BOWEL PROBLEMS  UNUSUAL RASH Items with * indicate a potential emergency and should be followed up as soon as possible.  Feel free to call the clinic you have any questions or concerns. The clinic phone number is [phone].  Please show the CHEMO ALERT CARD at check-in to the Emergency Department and triage nurse.   

## 2016-05-23 NOTE — Progress Notes (Signed)
Hematology and Oncology Follow Up Visit  Shane [name] [id number] [birth year] 80 y.o. 05/23/2016 12:06 PM Shane [name], MDMcGowen, Shane Blackwater, MD   Principle Diagnosis: 80 year old gentleman with invasive high-grade urothelial carcinoma of the bladder diagnosed in September 2017. He has T2 N0 disease.   Prior Therapy: He is status post TURBT on 04/02/2016 which confirmed the tumor invades into the muscularis propria. He has no evidence of metastatic disease based on his abdominal imaging as well as chest x-ray. This was completed on 04/02/2016.  Current therapy: Neoadjuvant chemotherapy utilizing gemcitabine and cisplatin day 1 of cycle 1 of therapy was on 05/16/2016. He is here for day 8 of cycle 1.  Interim History: [name] presents today for a follow-up visit. Since the last visit, he had a Port-A-Cath inserted and received the first cycle of chemotherapy without any complaints. He denied any nausea, vomiting, or poor appetite. He maintained his weight for the most part and did not report any neurological deficits. He does not have any neuropathy, hearing deficits or ambulation difficulties. He continues to attend activities of daily living without any decline.  He does not report any headaches, blurry vision, syncope or seizures. He does not report any fevers or chills or sweats. He does not report any cough, wheezing or hemoptysis. He does not report any nausea, vomiting or abdominal pain. He does not report any constipation, diarrhea or change in his bowel habits. He does not report any frequency, urgency or hesitancy. He does not report any skeletal complaints of arthralgias or myalgias. The remaining review of systems unremarkable.    Medications: I have reviewed the patient's current medications.  Current Outpatient Prescriptions  Medication Sig Dispense Refill  . ferrous sulfate 325 (65 FE) MG EC tablet Take 1 tablet (325 mg total) by mouth daily with breakfast. 30 tablet 0  .  lidocaine-prilocaine (EMLA) cream Apply 1 application topically as needed. Apply to port before chemotherapy. 30 g 0  . omega-3 acid ethyl esters (LOVAZA) 1 g capsule Take 1 capsule (1 g total) by mouth daily. 90 capsule 3  . pravastatin (PRAVACHOL) 20 MG tablet Take 1 tablet (20 mg total) by mouth daily. 90 tablet 3  . prochlorperazine (COMPAZINE) 10 MG tablet Take 1 tablet (10 mg total) by mouth every 6 (six) hours as needed for nausea or vomiting. 30 tablet 0  . sitaGLIPtin-metformin (JANUMET) 50-1000 MG tablet Take 1 tablet by mouth 2 (two) times daily with a meal. 180 tablet 3  . tamsulosin (FLOMAX) 0.4 MG CAPS capsule Take 1 capsule (0.4 mg total) by mouth daily after supper. 90 capsule 3   No current facility-administered medications for this visit.      Allergies: No Known Allergies  Past Medical History, Surgical history, Social history, and Family History were reviewed and updated.   Physical Exam: Blood pressure 138/68, pulse 70, temperature 98.2 F (36.8 C), temperature source Oral, resp. rate 18, height 5' 7.5" (1.715 m), weight 202 lb 4.8 oz (91.8 kg), SpO2 100 %. ECOG: 1 General appearance: alert and cooperative Head: Normocephalic, without obvious abnormality Neck: no adenopathy Lymph nodes: Cervical, supraclavicular, and axillary nodes normal. Heart:regular rate and rhythm, S1, S2 normal, no murmur, click, rub or gallop Lung:chest clear, no wheezing, rales, normal symmetric air entry Abdomin: soft, non-tender, without masses or organomegaly EXT:no erythema, induration, or nodules   Lab Results: Lab Results  Component Value Date   WBC 6.2 05/23/2016   HGB 10.6 (L) 05/23/2016   HCT 31.9 (L) 05/23/2016  MCV 86.4 05/23/2016   PLT 205 05/23/2016     Chemistry      Component Value Date/Time   NA 139 05/16/2016 0802   K 4.2 05/16/2016 0802   CL 109 04/01/2016 0258   CO2 26 05/16/2016 0802   BUN 14.1 05/16/2016 0802   CREATININE 0.8 05/16/2016 0802       Component Value Date/Time   CALCIUM 9.4 05/16/2016 0802   ALKPHOS 92 05/16/2016 0802   AST 10 05/16/2016 0802   ALT 12 05/16/2016 0802   BILITOT 0.31 05/16/2016 0802       Impression and Plan:   80 year old gentleman with the following issues:  1. Invasive high-grade urothelial carcinoma of the bladder after presenting with hematuria and difficulty urination in September 2017. He is status post TURBT on 04/02/2016 which confirmed the tumor invades into the muscularis propria. He has no evidence of metastatic disease based on his abdominal imaging as well as chest x-ray.  He is currently receiving neoadjuvant chemotherapy with cisplatin and gemcitabine and tolerated day 1 of cycle 1 without complications. He is ready to proceed with day 8 of the cycle. Cycle 2 will resume on schedule in 2 weeks. The plan is to proceed with total of 3 cycles of therapy.  2. IV access: Port-A-Cath inserted without complications. EMLA cream available to him.  3. Antiemetics: Prescription for Compazine made available to the patient today.  4. Renal function prophylaxis: We have discussed strategies to preserve his renal function including increased hydration as well as avoid nephrotoxic drugs. These include NSAIDs among others. His creatinine remained within normal range.  5. DVT prophylaxis: I recommended to increase ambulation and avoid immobility.  6. Diabetes: Blood sugar will be monitored closely while he is receiving premedication with dexamethasone.  7. Follow-up: In 2 weeks to start cycle 2 of therapy.  Zola Button, MD 11/10/201712:06 PM

## 2016-05-26 ENCOUNTER — Encounter: Payer: Self-pay | Admitting: Family Medicine

## 2016-06-02 ENCOUNTER — Telehealth: Payer: Self-pay | Admitting: General Practice

## 2016-06-02 NOTE — Telephone Encounter (Signed)
Spoke with daughter Ebony Hail) confirmed December appts.

## 2016-06-06 ENCOUNTER — Ambulatory Visit (HOSPITAL_BASED_OUTPATIENT_CLINIC_OR_DEPARTMENT_OTHER): Payer: Medicare Other | Admitting: Oncology

## 2016-06-06 ENCOUNTER — Ambulatory Visit (HOSPITAL_BASED_OUTPATIENT_CLINIC_OR_DEPARTMENT_OTHER): Payer: Medicare Other

## 2016-06-06 ENCOUNTER — Other Ambulatory Visit (HOSPITAL_BASED_OUTPATIENT_CLINIC_OR_DEPARTMENT_OTHER): Payer: Medicare Other

## 2016-06-06 ENCOUNTER — Telehealth: Payer: Self-pay | Admitting: Oncology

## 2016-06-06 VITALS — BP 154/77 | HR 67 | Temp 98.4°F | Resp 18 | Ht 67.5 in | Wt 205.4 lb

## 2016-06-06 DIAGNOSIS — Z5189 Encounter for other specified aftercare: Secondary | ICD-10-CM | POA: Diagnosis not present

## 2016-06-06 DIAGNOSIS — C675 Malignant neoplasm of bladder neck: Secondary | ICD-10-CM

## 2016-06-06 DIAGNOSIS — Z5111 Encounter for antineoplastic chemotherapy: Secondary | ICD-10-CM | POA: Diagnosis present

## 2016-06-06 DIAGNOSIS — E119 Type 2 diabetes mellitus without complications: Secondary | ICD-10-CM | POA: Diagnosis not present

## 2016-06-06 DIAGNOSIS — C679 Malignant neoplasm of bladder, unspecified: Secondary | ICD-10-CM

## 2016-06-06 LAB — CBC WITH DIFFERENTIAL/PLATELET
BASO%: 0.5 % (ref 0.0–2.0)
Basophils Absolute: 0 10*3/uL (ref 0.0–0.1)
EOS%: 2.9 % (ref 0.0–7.0)
Eosinophils Absolute: 0.1 10*3/uL (ref 0.0–0.5)
HCT: 33.2 % — ABNORMAL LOW (ref 38.4–49.9)
HGB: 10.9 g/dL — ABNORMAL LOW (ref 13.0–17.1)
LYMPH%: 38.7 % (ref 14.0–49.0)
MCH: 29.2 pg (ref 27.2–33.4)
MCHC: 32.8 g/dL (ref 32.0–36.0)
MCV: 89 fL (ref 79.3–98.0)
MONO#: 0.5 10*3/uL (ref 0.1–0.9)
MONO%: 12.2 % (ref 0.0–14.0)
NEUT#: 2 10*3/uL (ref 1.5–6.5)
NEUT%: 45.7 % (ref 39.0–75.0)
Platelets: 438 10*3/uL — ABNORMAL HIGH (ref 140–400)
RBC: 3.73 10*6/uL — ABNORMAL LOW (ref 4.20–5.82)
RDW: 15.4 % — ABNORMAL HIGH (ref 11.0–14.6)
WBC: 4.4 10*3/uL (ref 4.0–10.3)
lymph#: 1.7 10*3/uL (ref 0.9–3.3)

## 2016-06-06 LAB — COMPREHENSIVE METABOLIC PANEL
ALT: 8 U/L (ref 0–55)
AST: 10 U/L (ref 5–34)
Albumin: 3.3 g/dL — ABNORMAL LOW (ref 3.5–5.0)
Alkaline Phosphatase: 65 U/L (ref 40–150)
Anion Gap: 10 mEq/L (ref 3–11)
BUN: 24.5 mg/dL (ref 7.0–26.0)
CO2: 22 mEq/L (ref 22–29)
Calcium: 9.2 mg/dL (ref 8.4–10.4)
Chloride: 109 mEq/L (ref 98–109)
Creatinine: 0.8 mg/dL (ref 0.7–1.3)
EGFR: 85 mL/min/{1.73_m2} — ABNORMAL LOW (ref >90)
Glucose: 124 mg/dl (ref 70–140)
Potassium: 4 mEq/L (ref 3.5–5.1)
Sodium: 141 mEq/L (ref 136–145)
Total Bilirubin: <0.22 mg/dL (ref 0.20–1.20)
Total Protein: 7 g/dL (ref 6.4–8.3)

## 2016-06-06 MED ORDER — SODIUM CHLORIDE 0.9 % IV SOLN
Freq: Once | INTRAVENOUS | Status: AC
  Administered 2016-06-06: 10:00:00 via INTRAVENOUS

## 2016-06-06 MED ORDER — PALONOSETRON HCL INJECTION 0.25 MG/5ML
0.2500 mg | Freq: Once | INTRAVENOUS | Status: AC
  Administered 2016-06-06: 0.25 mg via INTRAVENOUS

## 2016-06-06 MED ORDER — SODIUM CHLORIDE 0.9 % IV SOLN
Freq: Once | INTRAVENOUS | Status: AC
  Administered 2016-06-06: 13:00:00 via INTRAVENOUS
  Filled 2016-06-06: qty 5

## 2016-06-06 MED ORDER — SODIUM CHLORIDE 0.9% FLUSH
10.0000 mL | INTRAVENOUS | Status: DC | PRN
  Administered 2016-06-06: 10 mL
  Filled 2016-06-06: qty 10

## 2016-06-06 MED ORDER — ALTEPLASE 2 MG IJ SOLR
2.0000 mg | Freq: Once | INTRAMUSCULAR | Status: AC | PRN
  Administered 2016-06-06: 2 mg
  Filled 2016-06-06: qty 2

## 2016-06-06 MED ORDER — PALONOSETRON HCL INJECTION 0.25 MG/5ML
INTRAVENOUS | Status: AC
  Filled 2016-06-06: qty 5

## 2016-06-06 MED ORDER — POTASSIUM CHLORIDE 2 MEQ/ML IV SOLN
Freq: Once | INTRAVENOUS | Status: AC
  Administered 2016-06-06: 10:00:00 via INTRAVENOUS
  Filled 2016-06-06: qty 10

## 2016-06-06 MED ORDER — SODIUM CHLORIDE 0.9 % IV SOLN
1000.0000 mg/m2 | Freq: Once | INTRAVENOUS | Status: AC
  Administered 2016-06-06: 2090 mg via INTRAVENOUS
  Filled 2016-06-06: qty 54.97

## 2016-06-06 MED ORDER — HEPARIN SOD (PORK) LOCK FLUSH 100 UNIT/ML IV SOLN
500.0000 [IU] | Freq: Once | INTRAVENOUS | Status: AC | PRN
  Administered 2016-06-06: 500 [IU]
  Filled 2016-06-06: qty 5

## 2016-06-06 MED ORDER — SODIUM CHLORIDE 0.9 % IV SOLN
71.5000 mg/m2 | Freq: Once | INTRAVENOUS | Status: AC
  Administered 2016-06-06: 150 mg via INTRAVENOUS
  Filled 2016-06-06: qty 100

## 2016-06-06 NOTE — Patient Instructions (Addendum)
[facility] Discharge Instructions for Patients Receiving Chemotherapy  Today you received the following chemotherapy agents:  Gemzar, Cisplatin  To help prevent nausea and vomiting after your treatment, we encourage you to take your nausea medication as prescribed.   If you develop nausea and vomiting that is not controlled by your nausea medication, call the clinic.   BELOW ARE SYMPTOMS THAT SHOULD BE REPORTED IMMEDIATELY:  *FEVER GREATER THAN 100.5 F  *CHILLS WITH OR WITHOUT FEVER  NAUSEA AND VOMITING THAT IS NOT CONTROLLED WITH YOUR NAUSEA MEDICATION  *UNUSUAL SHORTNESS OF BREATH  *UNUSUAL BRUISING OR BLEEDING  TENDERNESS IN MOUTH AND THROAT WITH OR WITHOUT PRESENCE OF ULCERS  *URINARY PROBLEMS  *BOWEL PROBLEMS  UNUSUAL RASH Items with * indicate a potential emergency and should be followed up as soon as possible.  Feel free to call the clinic you have any questions or concerns. The clinic phone number is [phone].  Please show the CHEMO ALERT CARD at check-in to the Emergency Department and triage nurse.   

## 2016-06-06 NOTE — Progress Notes (Signed)
Hematology and Oncology Follow Up Visit  Shane [name] [id number] 01-01-1936 80 y.o. 06/06/2016 9:26 AM [name],Shane H, MDMcGowen, Shane Blackwater, MD   Principle Diagnosis: 80 year old gentleman with invasive high-grade urothelial carcinoma of the bladder diagnosed in September 2017. He has T2 N0 disease.   Prior Therapy: He is status post TURBT on 04/02/2016 which confirmed the tumor invades into the muscularis propria. He has no evidence of metastatic disease based on his abdominal imaging as well as chest x-ray. This was completed on 04/02/2016.  Current therapy: Neoadjuvant chemotherapy utilizing gemcitabine and cisplatin day 1 of cycle 1 of therapy was on 05/16/2016. He is here for day 1 of cycle 2.  Interim History: Shane [name] presents today for a follow-up visit. Since the last visit, he completed the first cycle of chemotherapy without any delayed toxicities. He denied any nausea, vomiting or peripheral neuropathy. He denied any hearing deficits. He maintained his weight for the most part and did not report any neurological deficits. He remains active and attends to activities of daily living. He had a good Thanksgiving dinner last night and ready to proceed with chemotherapy today.  He does not report any headaches, blurry vision, syncope or seizures. He does not report any fevers or chills or sweats. He does not report any cough, wheezing or hemoptysis. He does not report any nausea, vomiting or abdominal pain. He does not report any constipation, diarrhea or change in his bowel habits. He does not report any frequency, urgency or hesitancy. He does not report any skeletal complaints of arthralgias or myalgias. The remaining review of systems unremarkable.    Medications: I have reviewed the patient's current medications.  Current Outpatient Prescriptions  Medication Sig Dispense Refill  . ferrous sulfate 325 (65 FE) MG EC tablet Take 1 tablet (325 mg total) by mouth daily with breakfast. 30  tablet 0  . lidocaine-prilocaine (EMLA) cream Apply 1 application topically as needed. Apply to port before chemotherapy. 30 g 0  . omega-3 acid ethyl esters (LOVAZA) 1 g capsule Take 1 capsule (1 g total) by mouth daily. 90 capsule 3  . pravastatin (PRAVACHOL) 20 MG tablet Take 1 tablet (20 mg total) by mouth daily. 90 tablet 3  . prochlorperazine (COMPAZINE) 10 MG tablet Take 1 tablet (10 mg total) by mouth every 6 (six) hours as needed for nausea or vomiting. 30 tablet 0  . sitaGLIPtin-metformin (JANUMET) 50-1000 MG tablet Take 1 tablet by mouth 2 (two) times daily with a meal. 180 tablet 3  . tamsulosin (FLOMAX) 0.4 MG CAPS capsule Take 1 capsule (0.4 mg total) by mouth daily after supper. 90 capsule 3   No current facility-administered medications for this visit.      Allergies: No Known Allergies  Past Medical History, Surgical history, Social history, and Family History were reviewed and updated.   Physical Exam: Blood pressure (!) 154/77, pulse 67, temperature 98.4 F (36.9 C), temperature source Oral, resp. rate 18, height 5' 7.5" (1.715 m), weight 205 lb 6.4 oz (93.2 kg), SpO2 100 %. ECOG: 1 General appearance: alert and cooperative appeared without distress. Head: Normocephalic, without obvious abnormality oral ulcers or lesions. Neck: no adenopathy Lymph nodes: Cervical, supraclavicular, and axillary nodes normal. Heart:regular rate and rhythm, S1, S2 normal, no murmur, click, rub or gallop Lung:chest clear, no wheezing, rales, normal symmetric air entry Abdomin: soft, non-tender, without masses or organomegaly no shifting dullness or ascites. EXT:no erythema, induration, or nodules   Lab Results: Lab Results  Component Value Date  WBC 4.4 06/06/2016   HGB 10.9 (L) 06/06/2016   HCT 33.2 (L) 06/06/2016   MCV 89.0 06/06/2016   PLT 438 (H) 06/06/2016     Chemistry      Component Value Date/Time   NA 138 05/23/2016 1132   K 4.0 05/23/2016 1132   CL 109 04/01/2016  0258   CO2 26 05/23/2016 1132   BUN 21.0 05/23/2016 1132   CREATININE 0.8 05/23/2016 1132      Component Value Date/Time   CALCIUM 9.3 05/23/2016 1132   ALKPHOS 84 05/23/2016 1132   AST 14 05/23/2016 1132   ALT 18 05/23/2016 1132   BILITOT 0.24 05/23/2016 1132       Impression and Plan:   80 year old gentleman with the following issues:  1. Invasive high-grade urothelial carcinoma of the bladder after presenting with hematuria and difficulty urination in September 2017. He is status post TURBT on 04/02/2016 which confirmed the tumor invades into the muscularis propria. He has no evidence of metastatic disease based on his abdominal imaging as well as chest x-ray.  He is currently receiving neoadjuvant chemotherapy with cisplatin and gemcitabine and tolerated the first cycle without complications. He is ready to proceed with cycle 2 without any dose reduction or delay. He received total of 3 cycles before radical cystectomy.   2. IV access: Port-A-Cath inserted without complications. There has been difficulty obtaining blood return from his Port-A-Cath today. Peripheral IVs can be used today. 2.  3. Antiemetics: Prescription for Compazine made available to the patient today. No nausea or vomiting noted.  4. Renal function prophylaxis: Kidney function appears to be within normal range. We continue to discuss That are nephrotoxic and he is to avoid these during therapy. Adequate hydration and electrolyte replacement is ongoing.  5. DVT prophylaxis: I recommended to increase ambulation and avoid immobility.  6. Diabetes: Blood sugar will be monitored closely while he is receiving premedication with dexamethasone. Blood sugar continues to be within normal range.  7. Follow-up: In in 1 week for day 8 of cycle 2.  [facility], MD 11/24/20179:26 AM

## 2016-06-06 NOTE — Telephone Encounter (Signed)
Appointments scheduled, per 06/06/16 los. A copy of the AVS report and appointment schedule was given to the patient, per 06/06/16 los.

## 2016-06-11 ENCOUNTER — Encounter: Payer: Self-pay | Admitting: Urology

## 2016-06-13 ENCOUNTER — Other Ambulatory Visit (HOSPITAL_BASED_OUTPATIENT_CLINIC_OR_DEPARTMENT_OTHER): Payer: Medicare Other

## 2016-06-13 ENCOUNTER — Ambulatory Visit (HOSPITAL_BASED_OUTPATIENT_CLINIC_OR_DEPARTMENT_OTHER): Payer: Medicare Other

## 2016-06-13 VITALS — BP 131/65 | HR 63 | Temp 98.3°F | Resp 16

## 2016-06-13 DIAGNOSIS — Z5111 Encounter for antineoplastic chemotherapy: Secondary | ICD-10-CM | POA: Diagnosis not present

## 2016-06-13 DIAGNOSIS — C679 Malignant neoplasm of bladder, unspecified: Secondary | ICD-10-CM

## 2016-06-13 DIAGNOSIS — C675 Malignant neoplasm of bladder neck: Secondary | ICD-10-CM

## 2016-06-13 LAB — CBC WITH DIFFERENTIAL/PLATELET
BASO%: 1.5 % (ref 0.0–2.0)
Basophils Absolute: 0.1 10*3/uL (ref 0.0–0.1)
EOS%: 0.5 % (ref 0.0–7.0)
Eosinophils Absolute: 0 10*3/uL (ref 0.0–0.5)
HCT: 31.8 % — ABNORMAL LOW (ref 38.4–49.9)
HGB: 10.5 g/dL — ABNORMAL LOW (ref 13.0–17.1)
LYMPH%: 61.6 % — ABNORMAL HIGH (ref 14.0–49.0)
MCH: 29.5 pg (ref 27.2–33.4)
MCHC: 33 g/dL (ref 32.0–36.0)
MCV: 89.3 fL (ref 79.3–98.0)
MONO#: 0.2 10*3/uL (ref 0.1–0.9)
MONO%: 4.4 % (ref 0.0–14.0)
NEUT#: 1.2 10*3/uL — ABNORMAL LOW (ref 1.5–6.5)
NEUT%: 32 % — ABNORMAL LOW (ref 39.0–75.0)
Platelets: 383 10*3/uL (ref 140–400)
RBC: 3.56 10*6/uL — ABNORMAL LOW (ref 4.20–5.82)
RDW: 16 % — ABNORMAL HIGH (ref 11.0–14.6)
WBC: 3.9 10*3/uL — ABNORMAL LOW (ref 4.0–10.3)
lymph#: 2.4 10*3/uL (ref 0.9–3.3)

## 2016-06-13 LAB — COMPREHENSIVE METABOLIC PANEL
ALT: 11 U/L (ref 0–55)
AST: 14 U/L (ref 5–34)
Albumin: 3.3 g/dL — ABNORMAL LOW (ref 3.5–5.0)
Alkaline Phosphatase: 66 U/L (ref 40–150)
Anion Gap: 11 mEq/L (ref 3–11)
BUN: 29.9 mg/dL — ABNORMAL HIGH (ref 7.0–26.0)
CO2: 25 mEq/L (ref 22–29)
Calcium: 9.4 mg/dL (ref 8.4–10.4)
Chloride: 103 mEq/L (ref 98–109)
Creatinine: 0.9 mg/dL (ref 0.7–1.3)
EGFR: 83 mL/min/{1.73_m2} — ABNORMAL LOW (ref >90)
Glucose: 102 mg/dl (ref 70–140)
Potassium: 4.2 mEq/L (ref 3.5–5.1)
Sodium: 139 mEq/L (ref 136–145)
Total Bilirubin: <0.22 mg/dL (ref 0.20–1.20)
Total Protein: 7.2 g/dL (ref 6.4–8.3)

## 2016-06-13 MED ORDER — PROCHLORPERAZINE MALEATE 10 MG PO TABS
10.0000 mg | ORAL_TABLET | Freq: Once | ORAL | Status: AC
  Administered 2016-06-13: 10 mg via ORAL

## 2016-06-13 MED ORDER — SODIUM CHLORIDE 0.9 % IV SOLN
Freq: Once | INTRAVENOUS | Status: AC
  Administered 2016-06-13: 12:00:00 via INTRAVENOUS

## 2016-06-13 MED ORDER — HEPARIN SOD (PORK) LOCK FLUSH 100 UNIT/ML IV SOLN
500.0000 [IU] | Freq: Once | INTRAVENOUS | Status: AC | PRN
  Administered 2016-06-13: 500 [IU]
  Filled 2016-06-13: qty 5

## 2016-06-13 MED ORDER — SODIUM CHLORIDE 0.9 % IV SOLN
1000.0000 mg/m2 | Freq: Once | INTRAVENOUS | Status: AC
  Administered 2016-06-13: 2090 mg via INTRAVENOUS
  Filled 2016-06-13: qty 54.97

## 2016-06-13 MED ORDER — PROCHLORPERAZINE MALEATE 10 MG PO TABS
ORAL_TABLET | ORAL | Status: AC
  Filled 2016-06-13: qty 1

## 2016-06-13 MED ORDER — SODIUM CHLORIDE 0.9% FLUSH
10.0000 mL | INTRAVENOUS | Status: DC | PRN
  Administered 2016-06-13: 10 mL
  Filled 2016-06-13: qty 10

## 2016-06-13 NOTE — Progress Notes (Signed)
Ok to treat per Dr Alen Blew with Booker 1.2

## 2016-06-13 NOTE — Patient Instructions (Signed)
[facility] Discharge Instructions for Patients Receiving Chemotherapy  Today you received the following chemotherapy agents Gemzar.  To help prevent nausea and vomiting after your treatment, we encourage you to take your nausea medication.   If you develop nausea and vomiting that is not controlled by your nausea medication, call the clinic.   BELOW ARE SYMPTOMS THAT SHOULD BE REPORTED IMMEDIATELY:  *FEVER GREATER THAN 100.5 F  *CHILLS WITH OR WITHOUT FEVER  NAUSEA AND VOMITING THAT IS NOT CONTROLLED WITH YOUR NAUSEA MEDICATION  *UNUSUAL SHORTNESS OF BREATH  *UNUSUAL BRUISING OR BLEEDING  TENDERNESS IN MOUTH AND THROAT WITH OR WITHOUT PRESENCE OF ULCERS  *URINARY PROBLEMS  *BOWEL PROBLEMS  UNUSUAL RASH Items with * indicate a potential emergency and should be followed up as soon as possible.  Feel free to call the clinic you have any questions or concerns. The clinic phone number is [phone].  Please show the CHEMO ALERT CARD at check-in to the Emergency Department and triage nurse.   

## 2016-06-27 ENCOUNTER — Other Ambulatory Visit: Payer: Medicare Other

## 2016-06-27 ENCOUNTER — Ambulatory Visit: Payer: Medicare Other

## 2016-06-27 ENCOUNTER — Ambulatory Visit (HOSPITAL_BASED_OUTPATIENT_CLINIC_OR_DEPARTMENT_OTHER): Payer: Medicare Other

## 2016-06-27 ENCOUNTER — Ambulatory Visit (HOSPITAL_BASED_OUTPATIENT_CLINIC_OR_DEPARTMENT_OTHER): Payer: Medicare Other | Admitting: Oncology

## 2016-06-27 ENCOUNTER — Telehealth: Payer: Self-pay | Admitting: Oncology

## 2016-06-27 ENCOUNTER — Other Ambulatory Visit (HOSPITAL_BASED_OUTPATIENT_CLINIC_OR_DEPARTMENT_OTHER): Payer: Medicare Other

## 2016-06-27 VITALS — BP 152/86 | HR 69 | Temp 98.6°F | Resp 18 | Ht 67.5 in | Wt 208.9 lb

## 2016-06-27 DIAGNOSIS — E119 Type 2 diabetes mellitus without complications: Secondary | ICD-10-CM | POA: Diagnosis not present

## 2016-06-27 DIAGNOSIS — C675 Malignant neoplasm of bladder neck: Secondary | ICD-10-CM

## 2016-06-27 DIAGNOSIS — Z95828 Presence of other vascular implants and grafts: Secondary | ICD-10-CM

## 2016-06-27 DIAGNOSIS — C679 Malignant neoplasm of bladder, unspecified: Secondary | ICD-10-CM

## 2016-06-27 DIAGNOSIS — Z5111 Encounter for antineoplastic chemotherapy: Secondary | ICD-10-CM | POA: Diagnosis not present

## 2016-06-27 LAB — COMPREHENSIVE METABOLIC PANEL
ALT: 10 U/L (ref 0–55)
AST: 14 U/L (ref 5–34)
Albumin: 3.4 g/dL — ABNORMAL LOW (ref 3.5–5.0)
Alkaline Phosphatase: 56 U/L (ref 40–150)
Anion Gap: 10 mEq/L (ref 3–11)
BUN: 22.1 mg/dL (ref 7.0–26.0)
CO2: 24 mEq/L (ref 22–29)
Calcium: 9.1 mg/dL (ref 8.4–10.4)
Chloride: 107 mEq/L (ref 98–109)
Creatinine: 0.9 mg/dL (ref 0.7–1.3)
EGFR: 83 mL/min/{1.73_m2} — ABNORMAL LOW (ref >90)
Glucose: 158 mg/dl — ABNORMAL HIGH (ref 70–140)
Potassium: 3.9 mEq/L (ref 3.5–5.1)
Sodium: 140 mEq/L (ref 136–145)
Total Bilirubin: 0.26 mg/dL (ref 0.20–1.20)
Total Protein: 7 g/dL (ref 6.4–8.3)

## 2016-06-27 LAB — CBC WITH DIFFERENTIAL/PLATELET
BASO%: 0.5 % (ref 0.0–2.0)
Basophils Absolute: 0 10*3/uL (ref 0.0–0.1)
EOS%: 2.5 % (ref 0.0–7.0)
Eosinophils Absolute: 0.1 10*3/uL (ref 0.0–0.5)
HCT: 31.2 % — ABNORMAL LOW (ref 38.4–49.9)
HGB: 10.4 g/dL — ABNORMAL LOW (ref 13.0–17.1)
LYMPH%: 41 % (ref 14.0–49.0)
MCH: 30.2 pg (ref 27.2–33.4)
MCHC: 33.3 g/dL (ref 32.0–36.0)
MCV: 90.7 fL (ref 79.3–98.0)
MONO#: 0.4 10*3/uL (ref 0.1–0.9)
MONO%: 8.9 % (ref 0.0–14.0)
NEUT#: 1.9 10*3/uL (ref 1.5–6.5)
NEUT%: 47.1 % (ref 39.0–75.0)
Platelets: 232 10*3/uL (ref 140–400)
RBC: 3.44 10*6/uL — ABNORMAL LOW (ref 4.20–5.82)
RDW: 18.4 % — ABNORMAL HIGH (ref 11.0–14.6)
WBC: 3.9 10*3/uL — ABNORMAL LOW (ref 4.0–10.3)
lymph#: 1.6 10*3/uL (ref 0.9–3.3)

## 2016-06-27 MED ORDER — HEPARIN SOD (PORK) LOCK FLUSH 100 UNIT/ML IV SOLN
500.0000 [IU] | Freq: Once | INTRAVENOUS | Status: AC | PRN
  Administered 2016-06-27: 500 [IU]
  Filled 2016-06-27: qty 5

## 2016-06-27 MED ORDER — SODIUM CHLORIDE 0.9% FLUSH
10.0000 mL | INTRAVENOUS | Status: DC | PRN
  Administered 2016-06-27: 10 mL
  Filled 2016-06-27: qty 10

## 2016-06-27 MED ORDER — PALONOSETRON HCL INJECTION 0.25 MG/5ML
0.2500 mg | Freq: Once | INTRAVENOUS | Status: AC
  Administered 2016-06-27: 0.25 mg via INTRAVENOUS

## 2016-06-27 MED ORDER — SODIUM CHLORIDE 0.9 % IV SOLN
Freq: Once | INTRAVENOUS | Status: AC
  Administered 2016-06-27: 09:00:00 via INTRAVENOUS

## 2016-06-27 MED ORDER — SODIUM CHLORIDE 0.9% FLUSH
10.0000 mL | INTRAVENOUS | Status: DC | PRN
  Administered 2016-06-27: 10 mL via INTRAVENOUS
  Filled 2016-06-27: qty 10

## 2016-06-27 MED ORDER — SODIUM CHLORIDE 0.9 % IV SOLN
Freq: Once | INTRAVENOUS | Status: AC
  Administered 2016-06-27: 12:00:00 via INTRAVENOUS
  Filled 2016-06-27: qty 5

## 2016-06-27 MED ORDER — SODIUM CHLORIDE 0.9 % IV SOLN
71.5000 mg/m2 | Freq: Once | INTRAVENOUS | Status: AC
  Administered 2016-06-27: 150 mg via INTRAVENOUS
  Filled 2016-06-27: qty 150

## 2016-06-27 MED ORDER — SODIUM CHLORIDE 0.9 % IV SOLN
2000.0000 mg | Freq: Once | INTRAVENOUS | Status: AC
  Administered 2016-06-27: 2000 mg via INTRAVENOUS
  Filled 2016-06-27: qty 52.6

## 2016-06-27 MED ORDER — PALONOSETRON HCL INJECTION 0.25 MG/5ML
INTRAVENOUS | Status: AC
  Filled 2016-06-27: qty 5

## 2016-06-27 MED ORDER — POTASSIUM CHLORIDE 2 MEQ/ML IV SOLN
Freq: Once | INTRAVENOUS | Status: AC
  Administered 2016-06-27: 10:00:00 via INTRAVENOUS
  Filled 2016-06-27: qty 10

## 2016-06-27 NOTE — Patient Instructions (Signed)
[facility] Discharge Instructions for Patients Receiving Chemotherapy  Today you received the following chemotherapy agents:  Gemzar and Cisplatin.  To help prevent nausea and vomiting after your treatment, we encourage you to take your nausea medication as directed.   If you develop nausea and vomiting that is not controlled by your nausea medication, call the clinic.   BELOW ARE SYMPTOMS THAT SHOULD BE REPORTED IMMEDIATELY:  *FEVER GREATER THAN 100.5 F  *CHILLS WITH OR WITHOUT FEVER  NAUSEA AND VOMITING THAT IS NOT CONTROLLED WITH YOUR NAUSEA MEDICATION  *UNUSUAL SHORTNESS OF BREATH  *UNUSUAL BRUISING OR BLEEDING  TENDERNESS IN MOUTH AND THROAT WITH OR WITHOUT PRESENCE OF ULCERS  *URINARY PROBLEMS  *BOWEL PROBLEMS  UNUSUAL RASH Items with * indicate a potential emergency and should be followed up as soon as possible.  Feel free to call the clinic you have any questions or concerns. The clinic phone number is [phone].  Please show the CHEMO ALERT CARD at check-in to the Emergency Department and triage nurse.   

## 2016-06-27 NOTE — Telephone Encounter (Signed)
Appointments scheduled per 12/14 LOS. Patient given AVS report and calendars with future scheduled appointments. Patient aware of CT scan and given two bottles of contrast.

## 2016-06-27 NOTE — Progress Notes (Signed)
Hematology and Oncology Follow Up Visit  Shane [name] [id number] 08-Apr-1936 80 y.o. 06/27/2016 8:40 AM Shane [name], MDMcGowen, Shane Blackwater, MD   Principle Diagnosis: 80 year old gentleman with invasive high-grade urothelial carcinoma of the bladder diagnosed in September 2017. He has T2 N0 disease.   Prior Therapy: He is status post TURBT on 04/02/2016 which confirmed the tumor invades into the muscularis propria. He has no evidence of metastatic disease based on his abdominal imaging as well as chest x-ray. This was completed on 04/02/2016.  Current therapy: Neoadjuvant chemotherapy utilizing gemcitabine and cisplatin day 1 of cycle 1 of therapy was on 05/16/2016. He is here for day 1 of cycle 3.  Interim History: Shane [name] presents today for a follow-up visit. Since the last visit, he continues to do well without any recent complaints. He continues to tolerate chemotherapy without any delayed toxicities. He denied any nausea, vomiting or peripheral neuropathy. He denied any hearing deficits. His appetite remain excellent without any decline. He remains active and attends to activities of daily living. He denies any hematuria, dysuria or flank pain. He did report some mild fatigue at grade 1 that improves dramatically.  He does not report any headaches, blurry vision, syncope or seizures. He does not report any fevers or chills or sweats. He does not report any cough, wheezing or hemoptysis. He does not report any nausea, vomiting or abdominal pain. He does not report any constipation, diarrhea or change in his bowel habits. He does not report any frequency, urgency or hesitancy. He does not report any skeletal complaints of arthralgias or myalgias. The remaining review of systems unremarkable.    Medications: I have reviewed the patient's current medications.  Current Outpatient Prescriptions  Medication Sig Dispense Refill  . ferrous sulfate 325 (65 FE) MG EC tablet Take 1 tablet (325 mg  total) by mouth daily with breakfast. 30 tablet 0  . lidocaine-prilocaine (EMLA) cream Apply 1 application topically as needed. Apply to port before chemotherapy. 30 g 0  . omega-3 acid ethyl esters (LOVAZA) 1 g capsule Take 1 capsule (1 g total) by mouth daily. 90 capsule 3  . pravastatin (PRAVACHOL) 20 MG tablet Take 1 tablet (20 mg total) by mouth daily. 90 tablet 3  . prochlorperazine (COMPAZINE) 10 MG tablet Take 1 tablet (10 mg total) by mouth every 6 (six) hours as needed for nausea or vomiting. 30 tablet 0  . sitaGLIPtin-metformin (JANUMET) 50-1000 MG tablet Take 1 tablet by mouth 2 (two) times daily with a meal. 180 tablet 3  . tamsulosin (FLOMAX) 0.4 MG CAPS capsule Take 1 capsule (0.4 mg total) by mouth daily after supper. 90 capsule 3   No current facility-administered medications for this visit.      Allergies: No Known Allergies  Past Medical History, Surgical history, Social history, and Family History were reviewed and updated.   Physical Exam: Blood pressure (!) 152/86, pulse 69, temperature 98.6 F (37 C), temperature source Oral, resp. rate 18, height 5' 7.5" (1.715 m), weight 208 lb 14.4 oz (94.8 kg), SpO2 98 %. ECOG: 1 General appearance: Well-appearing gentleman without distress. Head: Normocephalic, without obvious abnormality no oral ulcers or thrush. Neck: no adenopathy Lymph nodes: Cervical, supraclavicular, and axillary nodes normal. Heart:regular rate and rhythm, S1, S2 normal, no murmur, click, rub or gallop Lung:chest clear, no wheezing, rales, normal symmetric air entry Abdomin: soft, non-tender, without masses or organomegaly no rebound or guarding. EXT:no erythema, induration, or nodules   Lab Results: Lab Results  Component Value  Date   WBC 3.9 (L) 06/27/2016   HGB 10.4 (L) 06/27/2016   HCT 31.2 (L) 06/27/2016   MCV 90.7 06/27/2016   PLT 232 06/27/2016     Chemistry      Component Value Date/Time   NA 139 06/13/2016 1138   K 4.2 06/13/2016  1138   CL 109 04/01/2016 0258   CO2 25 06/13/2016 1138   BUN 29.9 (H) 06/13/2016 1138   CREATININE 0.9 06/13/2016 1138      Component Value Date/Time   CALCIUM 9.4 06/13/2016 1138   ALKPHOS 66 06/13/2016 1138   AST 14 06/13/2016 1138   ALT 11 06/13/2016 1138   BILITOT <0.22 06/13/2016 1138       Impression and Plan:   80 year old gentleman with the following issues:  1. Invasive high-grade urothelial carcinoma of the bladder after presenting with hematuria and difficulty urination in September 2017. He is status post TURBT on 04/02/2016 which confirmed the tumor invades into the muscularis propria. He has no evidence of metastatic disease based on his abdominal imaging as well as chest x-ray.  He is currently receiving neoadjuvant chemotherapy with cisplatin and gemcitabine and tolerated Therapy well without complications.  He is ready to proceed with cycle 3 without any dose reduction or delay. He will receive day 8 of chemotherapy on 07/04/2016.  I have scheduled him staging CT scan after he completes total of 3 cycles of therapy. If his CT scan in January 2018 continues to show no evidence of advanced disease, surgical resection will be his next step.   2. IV access: Port-A-Cath inserted without complications. No difficulty accessing his Port-A-Cath at this time.  3. Antiemetics: Prescription for Compazine made available to the patient today. No nausea or vomiting noted.  4. Renal function prophylaxis: Kidney function continues to be within normal range.  5. DVT prophylaxis: I recommended to increase ambulation and avoid immobility.  6. Diabetes: Blood sugar will be monitored closely while he is receiving premedication with dexamethasone. Blood sugar continues to be within normal range.  7. Follow-up: In in 1 week for day 8 of cycle 3.  [facility], MD 12/15/20178:40 AM

## 2016-06-30 ENCOUNTER — Encounter: Payer: Self-pay | Admitting: Family Medicine

## 2016-07-04 ENCOUNTER — Ambulatory Visit (HOSPITAL_BASED_OUTPATIENT_CLINIC_OR_DEPARTMENT_OTHER): Payer: Medicare Other

## 2016-07-04 ENCOUNTER — Other Ambulatory Visit (HOSPITAL_BASED_OUTPATIENT_CLINIC_OR_DEPARTMENT_OTHER): Payer: Medicare Other

## 2016-07-04 ENCOUNTER — Other Ambulatory Visit: Payer: Medicare Other

## 2016-07-04 VITALS — BP 171/90 | HR 66 | Temp 99.3°F | Resp 18

## 2016-07-04 DIAGNOSIS — C675 Malignant neoplasm of bladder neck: Secondary | ICD-10-CM

## 2016-07-04 DIAGNOSIS — Z5111 Encounter for antineoplastic chemotherapy: Secondary | ICD-10-CM | POA: Diagnosis not present

## 2016-07-04 DIAGNOSIS — C679 Malignant neoplasm of bladder, unspecified: Secondary | ICD-10-CM

## 2016-07-04 LAB — COMPREHENSIVE METABOLIC PANEL
ALT: 14 U/L (ref 0–55)
AST: 13 U/L (ref 5–34)
Albumin: 3.6 g/dL (ref 3.5–5.0)
Alkaline Phosphatase: 56 U/L (ref 40–150)
Anion Gap: 7 mEq/L (ref 3–11)
BUN: 22.2 mg/dL (ref 7.0–26.0)
CO2: 25 mEq/L (ref 22–29)
Calcium: 9.1 mg/dL (ref 8.4–10.4)
Chloride: 107 mEq/L (ref 98–109)
Creatinine: 0.8 mg/dL (ref 0.7–1.3)
EGFR: 83 mL/min/{1.73_m2} — ABNORMAL LOW (ref >90)
Glucose: 107 mg/dl (ref 70–140)
Potassium: 3.9 mEq/L (ref 3.5–5.1)
Sodium: 140 mEq/L (ref 136–145)
Total Bilirubin: 0.22 mg/dL (ref 0.20–1.20)
Total Protein: 6.9 g/dL (ref 6.4–8.3)

## 2016-07-04 LAB — CBC WITH DIFFERENTIAL/PLATELET
BASO%: 1.8 % (ref 0.0–2.0)
Basophils Absolute: 0.1 10*3/uL (ref 0.0–0.1)
EOS%: 2.4 % (ref 0.0–7.0)
Eosinophils Absolute: 0.1 10*3/uL (ref 0.0–0.5)
HCT: 30.5 % — ABNORMAL LOW (ref 38.4–49.9)
HGB: 10.2 g/dL — ABNORMAL LOW (ref 13.0–17.1)
LYMPH%: 57.6 % — ABNORMAL HIGH (ref 14.0–49.0)
MCH: 30.3 pg (ref 27.2–33.4)
MCHC: 33.4 g/dL (ref 32.0–36.0)
MCV: 90.5 fL (ref 79.3–98.0)
MONO#: 0.2 10*3/uL (ref 0.1–0.9)
MONO%: 5.9 % (ref 0.0–14.0)
NEUT#: 1.1 10*3/uL — ABNORMAL LOW (ref 1.5–6.5)
NEUT%: 32.3 % — ABNORMAL LOW (ref 39.0–75.0)
Platelets: 222 10*3/uL (ref 140–400)
RBC: 3.37 10*6/uL — ABNORMAL LOW (ref 4.20–5.82)
RDW: 18.5 % — ABNORMAL HIGH (ref 11.0–14.6)
WBC: 3.4 10*3/uL — ABNORMAL LOW (ref 4.0–10.3)
lymph#: 1.9 10*3/uL (ref 0.9–3.3)

## 2016-07-04 MED ORDER — SODIUM CHLORIDE 0.9 % IV SOLN: 1000.0000 mg/m2 | Freq: Once | INTRAVENOUS | Status: DC

## 2016-07-04 MED ORDER — PROCHLORPERAZINE MALEATE 10 MG PO TABS
10.0000 mg | ORAL_TABLET | Freq: Once | ORAL | Status: AC
  Administered 2016-07-04: 10 mg via ORAL

## 2016-07-04 MED ORDER — HEPARIN SOD (PORK) LOCK FLUSH 100 UNIT/ML IV SOLN
500.0000 [IU] | Freq: Once | INTRAVENOUS | Status: AC | PRN
  Administered 2016-07-04: 500 [IU]
  Filled 2016-07-04: qty 5

## 2016-07-04 MED ORDER — PROCHLORPERAZINE MALEATE 10 MG PO TABS
ORAL_TABLET | ORAL | Status: AC
  Filled 2016-07-04: qty 1

## 2016-07-04 MED ORDER — SODIUM CHLORIDE 0.9 % IV SOLN
Freq: Once | INTRAVENOUS | Status: AC
  Administered 2016-07-04: 13:00:00 via INTRAVENOUS

## 2016-07-04 MED ORDER — SODIUM CHLORIDE 0.9% FLUSH
10.0000 mL | INTRAVENOUS | Status: DC | PRN
  Administered 2016-07-04: 10 mL
  Filled 2016-07-04: qty 10

## 2016-07-04 MED ORDER — SODIUM CHLORIDE 0.9 % IV SOLN
2000.0000 mg | Freq: Once | INTRAVENOUS | Status: AC
  Administered 2016-07-04: 2000 mg via INTRAVENOUS
  Filled 2016-07-04: qty 52.6

## 2016-07-04 NOTE — Progress Notes (Signed)
Per Erline Levine, RN, Dr. Alen Blew aware of ANC 1.1 and BP 171/90.  Okay to proceed with treatment today.

## 2016-07-04 NOTE — Patient Instructions (Signed)
[facility] Discharge Instructions for Patients Receiving Chemotherapy  Today you received the following chemotherapy agents Gemzar  To help prevent nausea and vomiting after your treatment, we encourage you to take your nausea medication as directed.    If you develop nausea and vomiting that is not controlled by your nausea medication, call the clinic.   BELOW ARE SYMPTOMS THAT SHOULD BE REPORTED IMMEDIATELY:  *FEVER GREATER THAN 100.5 F  *CHILLS WITH OR WITHOUT FEVER  NAUSEA AND VOMITING THAT IS NOT CONTROLLED WITH YOUR NAUSEA MEDICATION  *UNUSUAL SHORTNESS OF BREATH  *UNUSUAL BRUISING OR BLEEDING  TENDERNESS IN MOUTH AND THROAT WITH OR WITHOUT PRESENCE OF ULCERS  *URINARY PROBLEMS  *BOWEL PROBLEMS  UNUSUAL RASH Items with * indicate a potential emergency and should be followed up as soon as possible.  Feel free to call the clinic you have any questions or concerns. The clinic phone number is [phone].  Please show the CHEMO ALERT CARD at check-in to the Emergency Department and triage nurse.   

## 2016-07-04 NOTE — Progress Notes (Signed)
Per Dr. Alen Blew, it's OK to treat with today's labs. Infusion room nurse notified

## 2016-07-21 ENCOUNTER — Encounter: Payer: Self-pay | Admitting: Family Medicine

## 2016-07-21 ENCOUNTER — Telehealth: Payer: Self-pay | Admitting: Family Medicine

## 2016-07-21 ENCOUNTER — Ambulatory Visit (INDEPENDENT_AMBULATORY_CARE_PROVIDER_SITE_OTHER): Payer: Medicare Other | Admitting: Family Medicine

## 2016-07-21 VITALS — BP 166/82 | HR 68 | Temp 98.1°F | Resp 16 | Wt 206.0 lb

## 2016-07-21 DIAGNOSIS — I10 Essential (primary) hypertension: Secondary | ICD-10-CM | POA: Diagnosis not present

## 2016-07-21 DIAGNOSIS — E7800 Pure hypercholesterolemia, unspecified: Secondary | ICD-10-CM | POA: Diagnosis not present

## 2016-07-21 DIAGNOSIS — C679 Malignant neoplasm of bladder, unspecified: Secondary | ICD-10-CM

## 2016-07-21 DIAGNOSIS — R011 Cardiac murmur, unspecified: Secondary | ICD-10-CM

## 2016-07-21 DIAGNOSIS — E119 Type 2 diabetes mellitus without complications: Secondary | ICD-10-CM | POA: Diagnosis not present

## 2016-07-21 LAB — POCT GLYCOSYLATED HEMOGLOBIN (HGB A1C): Hemoglobin A1C: 5.9

## 2016-07-21 NOTE — Telephone Encounter (Signed)
Pls notify pt that I got his old records and reviewed them. I reviewed the cardiac testing he has had. I recommend he get an ultrasound of his heart (echocardiogram) to further evaluate a heart murmur I heard at his recent office visit.  If he is agreeable, let me know so I can place the order.-thx

## 2016-07-21 NOTE — Progress Notes (Signed)
Pre visit review using our clinic review tool, if applicable. No additional management support is needed unless otherwise documented below in the visit note. 

## 2016-07-21 NOTE — Progress Notes (Addendum)
OFFICE VISIT  07/21/2016   CC:  Chief Complaint  Patient presents with  . Follow-up   HPI:    Patient is a 81 y.o. Caucasian male who presents for 3 mo f/u DM 2, HTN, bladder cancer. This is my second visit with him and I have not received his prior PCP's records yet.  He finished neoadjuvant chemo 07/04/16 for his bladder cancer.  He'll get a CT scan this week, then f/u with oncology.    DM 2: not monitoring his glucoses at home.  Tries to eat a diabetic diet. HTN: this morning 136/62 at home.  Says average bp check at home is like this. He has hx of being on bp med but bp was getting too low lately on this (lisinopril 20mg  bid) when being treated with chemo. HLD: tolerating statin daily.  Not fasting today.  Past Medical History:  Diagnosis Date  . Bladder cancer (Shane [name]) 03/2016   Invasive high grade urothelial carcinoma.  No metastatic spread at time of dx.  Cystoscopy + bx and partial resection of tumor in hospital (when admitted for gross hematuria and urinary retention).  Neoadjuvant chemo as of 06/2016, then to get surgery/cystectomy and adjuvant chemo.  . Chronic calcific pancreatitis ([name]) 03/2016   CT: also small cyst in head of pancreas that needs 6 mo f/u imaging (09/2016)  . Diabetes mellitus without complication (Shane [name])   . Hypertension   . Pancreas cyst 03/2016   Repeat imaging with MRI pancrease protocol 09/2016    Past Surgical History:  Procedure Laterality Date  . CATARACT EXTRACTION    . IR GENERIC HISTORICAL  05/12/2016   IR US GUIDE VASC ACCESS RIGHT 05/12/2016 Greggory Keen, MD [facility]  . IR GENERIC HISTORICAL  05/12/2016   IR FLUORO GUIDE PORT INSERTION RIGHT 05/12/2016 Greggory Keen, MD [facility]  . TONSILLECTOMY AND ADENOIDECTOMY  Remote  . TRANSURETHRAL RESECTION OF PROSTATE N/A 04/02/2016   Procedure: CYSTOSCOPY, TRANSURETHRAL RESECTION OF BLADDER TUMOR;  Surgeon: [name], MD;  Location: [facility];  Service: Urology;  Laterality: N/A;     Outpatient Medications Prior to Visit  Medication Sig Dispense Refill  . ferrous sulfate 325 (65 FE) MG EC tablet Take 1 tablet (325 mg total) by mouth daily with breakfast. 30 tablet 0  . lidocaine-prilocaine (EMLA) cream Apply 1 application topically as needed. Apply to port before chemotherapy. 30 g 0  . omega-3 acid ethyl esters (LOVAZA) 1 g capsule Take 1 capsule (1 g total) by mouth daily. 90 capsule 3  . pravastatin (PRAVACHOL) 20 MG tablet Take 1 tablet (20 mg total) by mouth daily. 90 tablet 3  . prochlorperazine (COMPAZINE) 10 MG tablet Take 1 tablet (10 mg total) by mouth every 6 (six) hours as needed for nausea or vomiting. 30 tablet 0  . sitaGLIPtin-metformin (JANUMET) 50-1000 MG tablet Take 1 tablet by mouth 2 (two) times daily with a meal. 180 tablet 3  . tamsulosin (FLOMAX) 0.4 MG CAPS capsule Take 1 capsule (0.4 mg total) by mouth daily after supper. 90 capsule 3   No facility-administered medications prior to visit.     No Known Allergies  ROS As per HPI  PE: Blood pressure (!) 166/82, pulse 68, temperature 98.1 F (36.7 C), temperature source Oral, resp. rate 16, weight 206 lb (93.4 kg), SpO2 97 %. Gen: Alert, well appearing.  Patient is oriented to person, place, time, and situation. CV: RRR 2/6 systolic murmur, no rub or gallop. Chest is clear, no wheezing or  rales. Normal symmetric air entry throughout both lung fields. No chest wall deformities or tenderness. EXT: 2+ pitting edema R LL, 1+ pitting edema L LL. Flaky superficial desquamation of skin of lower legs.  LABS:   Lab Results  Component Value Date   WBC 3.4 (L) 07/04/2016   HGB 10.2 (L) 07/04/2016   HCT 30.5 (L) 07/04/2016   MCV 90.5 07/04/2016   PLT 222 07/04/2016   Lab Results  Component Value Date   CREATININE 0.8 07/04/2016   BUN 22.2 07/04/2016   NA 140 07/04/2016   K 3.9 07/04/2016   CL 109 04/01/2016   CO2 25 07/04/2016   Lab Results  Component Value Date   ALT 14 07/04/2016    AST 13 07/04/2016   ALKPHOS 56 07/04/2016   BILITOT 0.22 07/04/2016   Lab Results  Component Value Date   HGBA1C 5.9 07/21/2016   POCT HbA1c today was 5.9%  IMPRESSION AND PLAN:  1) Bladder cancer: he is s/p neoadjuvant chemo.  Gets f/u CT and then oncologist f/u visit very soon. Surgery expected to follow.  2) DM 2: good control, A1c 5.9% today.  Continue jamumet and diabetic diet. Next f/u visit we'll do urine microalbumin and foot exam. Will refer to ophthalmologist --he'll call back with info on desired ophtho MD (same as wife's). He declined flu vaccine today due to having just recently finishing chemo.  3) HTN; White coat component. Right now his home bps are fine OFF of his lisinopril.  4) Hyperlipidemia: not fasting today.  We'll try to get him to come to upcoming f/u appt fasting so we can monitor lipids.  Continue pravastatin.  Recent AST/ALT normal.  5) Heart murmur: expecting his old medical records any time now.  Will review--pt says he had a "complete" cardiac eval about 2012 prior to getting cataract surgery and he says it showed evidence of a prior MI.  He says he did not have to get any stents placed.  This is all he can recall. Considering further eval with echo and/or cardiac referral (esp if he needs pre-surgical clearance for his bladder surgery).  An After Visit Summary was printed and given to the patient.  FOLLOW UP: Return in about 3 months (around 10/19/2016) for routine chronic illness f/u.  Signed:  Crissie Sickles, MD           07/21/2016  ADDENDUM 9:30 PM 07/21/2016: Reviewed old PCP's records, noted hx of murmur documented in chart but no info regarding any echo. In 2014 he did have abnl EKG as part of pre-op assessment for cataract surgery, whuch led to NM stress treadmill test that showed a small area of infarct but no inducible ischemia, EF normal. A cath following this showed small vessel dz x 1 vessel, med mgmt recommended.   Since he is likely  going to get a substantial surgery soon to get bladder removed, neobladder created, etc, I'll have him get an echocardiogram to further eval this murmur/rule out valvular disease.  Signed:  Crissie Sickles, MD           07/21/2016

## 2016-07-22 ENCOUNTER — Telehealth: Payer: Self-pay | Admitting: Family Medicine

## 2016-07-22 DIAGNOSIS — E119 Type 2 diabetes mellitus without complications: Secondary | ICD-10-CM

## 2016-07-22 NOTE — Telephone Encounter (Signed)
Left message for pt to call back  °

## 2016-07-22 NOTE — Telephone Encounter (Signed)
OK, referral ordered as per pt request. 

## 2016-07-22 NOTE — Telephone Encounter (Signed)
Please advise. Thanks.  

## 2016-07-22 NOTE — Telephone Encounter (Signed)
Patient called back with opthalmologic info, he would like a referral to Dr. Macarthur Critchley at [facility]. Thanks

## 2016-07-23 ENCOUNTER — Encounter (HOSPITAL_COMMUNITY): Payer: Self-pay

## 2016-07-23 ENCOUNTER — Other Ambulatory Visit (HOSPITAL_BASED_OUTPATIENT_CLINIC_OR_DEPARTMENT_OTHER): Payer: Medicare Other

## 2016-07-23 ENCOUNTER — Ambulatory Visit (HOSPITAL_COMMUNITY)
Admission: RE | Admit: 2016-07-23 | Discharge: 2016-07-23 | Disposition: A | Discharge status: Home / Self Care | Payer: Medicare Other | Source: Ambulatory Visit | Attending: Oncology | Admitting: Oncology

## 2016-07-23 DIAGNOSIS — M4316 Spondylolisthesis, lumbar region: Secondary | ICD-10-CM | POA: Diagnosis not present

## 2016-07-23 DIAGNOSIS — C675 Malignant neoplasm of bladder neck: Secondary | ICD-10-CM | POA: Diagnosis present

## 2016-07-23 DIAGNOSIS — C679 Malignant neoplasm of bladder, unspecified: Secondary | ICD-10-CM | POA: Diagnosis not present

## 2016-07-23 DIAGNOSIS — K429 Umbilical hernia without obstruction or gangrene: Secondary | ICD-10-CM | POA: Diagnosis not present

## 2016-07-23 DIAGNOSIS — M47896 Other spondylosis, lumbar region: Secondary | ICD-10-CM | POA: Insufficient documentation

## 2016-07-23 DIAGNOSIS — M5136 Other intervertebral disc degeneration, lumbar region: Secondary | ICD-10-CM | POA: Insufficient documentation

## 2016-07-23 DIAGNOSIS — I700 Atherosclerosis of aorta: Secondary | ICD-10-CM | POA: Insufficient documentation

## 2016-07-23 DIAGNOSIS — K861 Other chronic pancreatitis: Secondary | ICD-10-CM | POA: Diagnosis not present

## 2016-07-23 DIAGNOSIS — I2510 Atherosclerotic heart disease of native coronary artery without angina pectoris: Secondary | ICD-10-CM | POA: Insufficient documentation

## 2016-07-23 DIAGNOSIS — R911 Solitary pulmonary nodule: Secondary | ICD-10-CM | POA: Insufficient documentation

## 2016-07-23 LAB — CBC WITH DIFFERENTIAL/PLATELET
BASO%: 1.6 % (ref 0.0–2.0)
Basophils Absolute: 0.1 10*3/uL (ref 0.0–0.1)
EOS%: 1.4 % (ref 0.0–7.0)
Eosinophils Absolute: 0.1 10*3/uL (ref 0.0–0.5)
HCT: 35.2 % — ABNORMAL LOW (ref 38.4–49.9)
HGB: 11.8 g/dL — ABNORMAL LOW (ref 13.0–17.1)
LYMPH%: 30.5 % (ref 14.0–49.0)
MCH: 31.2 pg (ref 27.2–33.4)
MCHC: 33.4 g/dL (ref 32.0–36.0)
MCV: 93.6 fL (ref 79.3–98.0)
MONO#: 0.7 10*3/uL (ref 0.1–0.9)
MONO%: 11.8 % (ref 0.0–14.0)
NEUT#: 3.3 10*3/uL (ref 1.5–6.5)
NEUT%: 54.7 % (ref 39.0–75.0)
Platelets: 385 10*3/uL (ref 140–400)
RBC: 3.76 10*6/uL — ABNORMAL LOW (ref 4.20–5.82)
RDW: 22.2 % — ABNORMAL HIGH (ref 11.0–14.6)
WBC: 6.1 10*3/uL (ref 4.0–10.3)
lymph#: 1.9 10*3/uL (ref 0.9–3.3)

## 2016-07-23 LAB — COMPREHENSIVE METABOLIC PANEL
ALT: 9 U/L (ref 0–55)
AST: 13 U/L (ref 5–34)
Albumin: 3.9 g/dL (ref 3.5–5.0)
Alkaline Phosphatase: 59 U/L (ref 40–150)
Anion Gap: 10 mEq/L (ref 3–11)
BUN: 23.9 mg/dL (ref 7.0–26.0)
CO2: 24 mEq/L (ref 22–29)
Calcium: 9.9 mg/dL (ref 8.4–10.4)
Chloride: 107 mEq/L (ref 98–109)
Creatinine: 1.1 mg/dL (ref 0.7–1.3)
EGFR: 67 mL/min/{1.73_m2} — ABNORMAL LOW (ref >90)
Glucose: 107 mg/dl (ref 70–140)
Potassium: 5.3 mEq/L — ABNORMAL HIGH (ref 3.5–5.1)
Sodium: 141 mEq/L (ref 136–145)
Total Bilirubin: 0.32 mg/dL (ref 0.20–1.20)
Total Protein: 7.4 g/dL (ref 6.4–8.3)

## 2016-07-23 MED ORDER — IOPAMIDOL (ISOVUE-300) INJECTION 61%
150.0000 mL | Freq: Once | INTRAVENOUS | Status: AC | PRN
  Administered 2016-07-23: 125 mL via INTRAVENOUS

## 2016-07-23 MED ORDER — SODIUM CHLORIDE 0.9 % IV SOLN
INTRAVENOUS | Status: AC
  Filled 2016-07-23: qty 250

## 2016-07-23 MED ORDER — IOPAMIDOL (ISOVUE-300) INJECTION 61%
INTRAVENOUS | Status: AC
  Filled 2016-07-23: qty 150

## 2016-07-25 ENCOUNTER — Ambulatory Visit (HOSPITAL_BASED_OUTPATIENT_CLINIC_OR_DEPARTMENT_OTHER): Payer: Medicare Other | Admitting: Oncology

## 2016-07-25 ENCOUNTER — Telehealth: Payer: Self-pay | Admitting: Oncology

## 2016-07-25 ENCOUNTER — Encounter: Payer: Self-pay | Admitting: Family Medicine

## 2016-07-25 VITALS — BP 153/75 | HR 77 | Temp 97.6°F | Resp 18 | Ht 67.5 in | Wt 205.3 lb

## 2016-07-25 DIAGNOSIS — C679 Malignant neoplasm of bladder, unspecified: Secondary | ICD-10-CM | POA: Diagnosis not present

## 2016-07-25 NOTE — Telephone Encounter (Signed)
Appointments scheduled per 11/2 LOS. Patient given AVS report and calendars with future scheduled appointments °

## 2016-07-25 NOTE — Telephone Encounter (Signed)
Noted  

## 2016-07-25 NOTE — Progress Notes (Signed)
Hematology and Oncology Follow Up Visit  Shane [name] [id number] 1936/01/04 81 y.o. 07/25/2016 3:02 PM MCGOWEN,PHILIP H, MDMcGowen, [name], MD   Principle Diagnosis: 81 year old gentleman with invasive high-grade urothelial carcinoma of the bladder diagnosed in September 2017. He has T2 N0 disease.   Prior Therapy: He is status post TURBT on 04/02/2016 which confirmed the tumor invades into the muscularis propria. He has no evidence of metastatic disease based on his abdominal imaging as well as chest x-ray. This was completed on 04/02/2016. Neoadjuvant chemotherapy utilizing gemcitabine and cisplatin day 1 of cycle 1 of therapy was on 05/16/2016. He is status post 3 cycles of therapy completed in December 2017.  Current therapy: Under evaluation for primary surgical therapy.   Interim History: [name] presents today for a follow-up visit. Since the last visit, he reports no delayed complications related to chemotherapy. He denied any nausea, vomiting or peripheral neuropathy. He denied any hearing deficits. His appetite remain excellent without any decline. He remains active and attends to activities of daily living. He denies any hematuria, dysuria or flank pain. He does not report any recent hospitalization or illnesses. His quality of life remains excellent.  He does not report any headaches, blurry vision, syncope or seizures. He does not report any fevers or chills or sweats. He does not report any cough, wheezing or hemoptysis. He does not report any nausea, vomiting or abdominal pain. He does not report any constipation, diarrhea or change in his bowel habits. He does not report any frequency, urgency or hesitancy. He does not report any skeletal complaints of arthralgias or myalgias. The remaining review of systems unremarkable.    Medications: I have reviewed the patient's current medications.  Current Outpatient Prescriptions  Medication Sig Dispense Refill  . ferrous sulfate 325  (65 FE) MG EC tablet Take 1 tablet (325 mg total) by mouth daily with breakfast. 30 tablet 0  . lidocaine-prilocaine (EMLA) cream Apply 1 application topically as needed. Apply to port before chemotherapy. 30 g 0  . omega-3 acid ethyl esters (LOVAZA) 1 g capsule Take 1 capsule (1 g total) by mouth daily. 90 capsule 3  . pravastatin (PRAVACHOL) 20 MG tablet Take 1 tablet (20 mg total) by mouth daily. 90 tablet 3  . prochlorperazine (COMPAZINE) 10 MG tablet Take 1 tablet (10 mg total) by mouth every 6 (six) hours as needed for nausea or vomiting. 30 tablet 0  . sitaGLIPtin-metformin (JANUMET) 50-1000 MG tablet Take 1 tablet by mouth 2 (two) times daily with a meal. 180 tablet 3  . tamsulosin (FLOMAX) 0.4 MG CAPS capsule Take 1 capsule (0.4 mg total) by mouth daily after supper. 90 capsule 3   No current facility-administered medications for this visit.      Allergies: No Known Allergies  Past Medical History, Surgical history, Social history, and Family History were reviewed and updated.   Physical Exam: Blood pressure (!) 153/75, pulse 77, temperature 97.6 F (36.4 C), temperature source Oral, resp. rate 18, height 5' 7.5" (1.715 m), weight 205 lb 4.8 oz (93.1 kg), SpO2 98 %. ECOG: 1 General appearance: Alert, awake gentleman without distress. Head: Normocephalic, without obvious abnormality no oral thrush noted. Neck: no adenopathy Lymph nodes: Cervical, supraclavicular, and axillary nodes normal. Heart:regular rate and rhythm, S1, S2 normal, no murmur, click, rub or gallop Lung:chest clear, no wheezing, rales, normal symmetric air entry Abdomin: soft, non-tender, without masses or organomegaly no shifting dullness or ascites EXT:no erythema, induration, or nodules   Lab Results: Lab  Results  Component Value Date   WBC 6.1 07/23/2016   HGB 11.8 (L) 07/23/2016   HCT 35.2 (L) 07/23/2016   MCV 93.6 07/23/2016   PLT 385 07/23/2016     Chemistry      Component Value Date/Time    NA 141 07/23/2016 0855   K 5.3 No visable hemolysis (H) 07/23/2016 0855   CL 109 04/01/2016 0258   CO2 24 07/23/2016 0855   BUN 23.9 07/23/2016 0855   CREATININE 1.1 07/23/2016 0855      Component Value Date/Time   CALCIUM 9.9 07/23/2016 0855   ALKPHOS 59 07/23/2016 0855   AST 13 07/23/2016 0855   ALT 9 07/23/2016 0855   BILITOT 0.32 07/23/2016 0855      EXAM: CT CHEST, ABDOMEN, AND PELVIS WITH CONTRAST  TECHNIQUE: Multidetector CT imaging of the chest, abdomen and pelvis was performed following the standard protocol during bolus administration of intravenous contrast.  CONTRAST:  124mL ISOVUE-300 IOPAMIDOL (ISOVUE-300) INJECTION 61%  COMPARISON:  04/01/2016  FINDINGS: CT CHEST FINDINGS  Cardiovascular: Normal heart size. No pericardial effusion. Calcification within the LAD, RCA and left circumflex coronary arteries noted.  Mediastinum/Nodes: The trachea appears patent and is midline. Normal appearance of the esophagus. No enlarged mediastinal or hilar adenopathy. There is no axillary or supraclavicular adenopathy.  Lungs/Pleura: No pleural fluid identified. Subpleural nodule overlying the lateral right upper lobe measures 1 x 0.6 cm (mean diameter 8 mm), image 21 of series 4.  Musculoskeletal: No chest wall mass or suspicious bone lesions identified.  CT ABDOMEN PELVIS FINDINGS  Hepatobiliary: No focal liver abnormality is seen. No gallstones, gallbladder wall thickening, or biliary dilatation.  Pancreas: Changes of chronic pancreatitis are identified including parenchymal calcifications and atrophy. In the head of pancreas there is a cystic lesion measuring 1.7 cm, image 70 of series 2. Previously 2 cm.  Spleen: The spleen is unremarkable.  Adrenals/Urinary Tract: The adrenal glands are normal. Bilateral renal sinus cysts are identified as before. No kidney mass or hydronephrosis identified. Significant interval improvement and asymmetric  left lateral wall bladder wall thickening. Residual focal thickening along the left lateral wall of the bladder measures 8 mm, image 81 of series 8.  Stomach/Bowel: Stomach is within normal limits. Appendix appears normal. No evidence of bowel wall thickening, distention, or inflammatory changes.  Vascular/Lymphatic: Aortic atherosclerosis. No aneurysm. No upper abdominal or pelvic adenopathy.  Reproductive: The prostate gland appears normal.  Other: There is a small periumbilical hernia which contains fat only. No abdominal or pelvic ascites. There is no suspicious fluid collections identified.  Musculoskeletal: No acute or significant osseous findings. Degenerative disc disease is identified at L4-5 and L5-S1. There is an anterolisthesis of L4 on L5.  IMPRESSION: 1. Interval response to therapy. Mass along the left lateral wall of the urinary bladder has significantly decreased in the interval. Residual focal thickening along the left lateral wall of the bladder measures 8 mm. 2. There is a nonspecific subpleural nodule within the lateral right upper lobe with an mean diameter of 8 mm. Attention on follow-up imaging is advised. 3. Aortic atherosclerosis and coronary artery calcifications 4. Changes of chronic pancreatitis noted. 5. Lumbar spondylosis.  Impression and Plan:   81 year old gentleman with the following issues:  1. Invasive high-grade urothelial carcinoma of the bladder after presenting with hematuria and difficulty urination in September 2017. He is status post TURBT on 04/02/2016 which confirmed the tumor invades into the muscularis propria. He has no evidence of metastatic disease based on  his abdominal imaging as well as chest x-ray.  He is status post 3 cycles of chemotherapy utilizing gemcitabine and cisplatin I was well-tolerated.  CT scan chest abdomen and pelvis done on 07/23/2016 showed no evidence of advanced disease with decrease in the size  of his bladder tumor.  I have recommended proceeding with further surgical therapy with radical cystectomy if he remains a candidate per Dr. Lyndal Rainbow recommendation.   2. IV access: Port-A-Cath will continue to be flushed every 2 months after completing his surgery.  3. Follow-up: Will be in 2 months and anticipating having his surgery done in the next 4-6 weeks.  [facility], MD 1/12/20183:02 PM

## 2016-07-25 NOTE — Telephone Encounter (Signed)
Pt advised and voiced understanding.  He stated that he wants to hold off on the ultrasound for now.

## 2016-07-27 ENCOUNTER — Encounter: Payer: Self-pay | Admitting: Family Medicine

## 2016-08-26 DIAGNOSIS — C672 Malignant neoplasm of lateral wall of bladder: Secondary | ICD-10-CM | POA: Diagnosis not present

## 2016-08-26 DIAGNOSIS — N281 Cyst of kidney, acquired: Secondary | ICD-10-CM | POA: Diagnosis not present

## 2016-08-26 DIAGNOSIS — K429 Umbilical hernia without obstruction or gangrene: Secondary | ICD-10-CM | POA: Diagnosis not present

## 2016-08-28 ENCOUNTER — Encounter: Payer: Self-pay | Admitting: *Deleted

## 2016-08-28 ENCOUNTER — Other Ambulatory Visit: Payer: Self-pay | Admitting: Urology

## 2016-08-30 ENCOUNTER — Encounter: Payer: Self-pay | Admitting: Family Medicine

## 2016-08-30 ENCOUNTER — Other Ambulatory Visit: Payer: Self-pay | Admitting: Family Medicine

## 2016-08-30 DIAGNOSIS — K862 Cyst of pancreas: Secondary | ICD-10-CM

## 2016-09-05 ENCOUNTER — Encounter: Payer: Self-pay | Admitting: Family Medicine

## 2016-09-08 ENCOUNTER — Other Ambulatory Visit: Payer: Self-pay | Admitting: Family Medicine

## 2016-09-08 ENCOUNTER — Telehealth: Payer: Self-pay | Admitting: *Deleted

## 2016-09-08 DIAGNOSIS — I10 Essential (primary) hypertension: Secondary | ICD-10-CM

## 2016-09-08 DIAGNOSIS — E119 Type 2 diabetes mellitus without complications: Secondary | ICD-10-CM

## 2016-09-08 NOTE — Telephone Encounter (Signed)
SW pt, he wants to know why he needs the MRI. I tired looking through his chart to see if there was any mention of this but I didn't see anything. Please advise. Thanks.

## 2016-09-08 NOTE — Telephone Encounter (Signed)
FYI

## 2016-09-08 NOTE — Telephone Encounter (Signed)
SW patient, Dr. Tresa Moore with [facility] did a CT and have now scheduled surgery for 10/17/16 to remove his bladder. Can we cancel MRI?

## 2016-09-08 NOTE — Telephone Encounter (Signed)
I ordered a future BMET for here. Pls call pt and have him come in for lab visit for this b/c it is required before he can get his MRI (fasting not necessary).-thx

## 2016-09-08 NOTE — Telephone Encounter (Signed)
Pt advised and voiced understanding.  Please cancel MRI. Thanks.

## 2016-09-08 NOTE — Telephone Encounter (Signed)
The MRI I ordered is to re-evaluate an abnormality of his pancreas---his urologist's recent CT scan does not adequately image the pancreas the way the MRI can. However, this MRI is something that can be pushed off to a time later this year since he is getting a big surgery in April. Go ahead and cancel the MRI and we'll pick up the issue of re-imaging his pancreas maybe this summer or fall.--thx

## 2016-09-08 NOTE — Telephone Encounter (Signed)
-----   Message from Nickola Major sent at 09/08/2016 11:58 AM EST ----- Regarding: FW: MR abd See message below ----- Message ----- From: Katha Hamming Sent: 09/08/2016  11:03 AM To: Tammi Sou, MD, Diane S Tomerlin Subject: MR abd                                         We have left messages for Shane [name] to call and schedule his MRI.  No return phone call.  He will need a new Bun/creatinine now since his labs are now out of date from 07/23/16.  They have to be within 6 weeks.    Thanks, Hoyle Sauer [facility]

## 2016-09-08 NOTE — Telephone Encounter (Signed)
Please advise. Thanks.  

## 2016-09-18 ENCOUNTER — Other Ambulatory Visit: Payer: Self-pay | Admitting: *Deleted

## 2016-09-18 DIAGNOSIS — C679 Malignant neoplasm of bladder, unspecified: Secondary | ICD-10-CM

## 2016-09-19 ENCOUNTER — Telehealth: Payer: Self-pay | Admitting: Oncology

## 2016-09-19 ENCOUNTER — Ambulatory Visit (HOSPITAL_BASED_OUTPATIENT_CLINIC_OR_DEPARTMENT_OTHER): Payer: Medicare Other | Admitting: Oncology

## 2016-09-19 ENCOUNTER — Ambulatory Visit (HOSPITAL_BASED_OUTPATIENT_CLINIC_OR_DEPARTMENT_OTHER): Payer: Medicare Other

## 2016-09-19 ENCOUNTER — Other Ambulatory Visit (HOSPITAL_BASED_OUTPATIENT_CLINIC_OR_DEPARTMENT_OTHER): Payer: Medicare Other

## 2016-09-19 VITALS — BP 138/65 | HR 52 | Temp 98.1°F | Resp 18 | Ht 67.5 in | Wt 209.3 lb

## 2016-09-19 DIAGNOSIS — C679 Malignant neoplasm of bladder, unspecified: Secondary | ICD-10-CM

## 2016-09-19 DIAGNOSIS — C675 Malignant neoplasm of bladder neck: Secondary | ICD-10-CM

## 2016-09-19 DIAGNOSIS — R309 Painful micturition, unspecified: Secondary | ICD-10-CM

## 2016-09-19 LAB — COMPREHENSIVE METABOLIC PANEL
ALT: 9 U/L (ref 0–55)
AST: 13 U/L (ref 5–34)
Albumin: 3.8 g/dL (ref 3.5–5.0)
Alkaline Phosphatase: 58 U/L (ref 40–150)
Anion Gap: 10 mEq/L (ref 3–11)
BUN: 19.6 mg/dL (ref 7.0–26.0)
CO2: 22 mEq/L (ref 22–29)
Calcium: 9.5 mg/dL (ref 8.4–10.4)
Chloride: 108 mEq/L (ref 98–109)
Creatinine: 0.9 mg/dL (ref 0.7–1.3)
EGFR: 77 mL/min/{1.73_m2} — ABNORMAL LOW (ref >90)
Glucose: 92 mg/dl (ref 70–140)
Potassium: 4.3 mEq/L (ref 3.5–5.1)
Sodium: 140 mEq/L (ref 136–145)
Total Bilirubin: 0.35 mg/dL (ref 0.20–1.20)
Total Protein: 7.2 g/dL (ref 6.4–8.3)

## 2016-09-19 LAB — CBC WITH DIFFERENTIAL/PLATELET
BASO%: 0.5 % (ref 0.0–2.0)
Basophils Absolute: 0 10*3/uL (ref 0.0–0.1)
EOS%: 3.3 % (ref 0.0–7.0)
Eosinophils Absolute: 0.2 10*3/uL (ref 0.0–0.5)
HCT: 38.5 % (ref 38.4–49.9)
HGB: 13.3 g/dL (ref 13.0–17.1)
LYMPH%: 38.2 % (ref 14.0–49.0)
MCH: 31.7 pg (ref 27.2–33.4)
MCHC: 34.5 g/dL (ref 32.0–36.0)
MCV: 91.7 fL (ref 79.3–98.0)
MONO#: 0.5 10*3/uL (ref 0.1–0.9)
MONO%: 8.6 % (ref 0.0–14.0)
NEUT#: 2.8 10*3/uL (ref 1.5–6.5)
NEUT%: 49.4 % (ref 39.0–75.0)
Platelets: 166 10*3/uL (ref 140–400)
RBC: 4.2 10*6/uL (ref 4.20–5.82)
RDW: 13 % (ref 11.0–14.6)
WBC: 5.7 10*3/uL (ref 4.0–10.3)
lymph#: 2.2 10*3/uL (ref 0.9–3.3)
nRBC: 0 % (ref 0–0)

## 2016-09-19 MED ORDER — SODIUM CHLORIDE 0.9 % IJ SOLN
10.0000 mL | Freq: Once | INTRAMUSCULAR | Status: AC
  Administered 2016-09-19: 10 mL
  Filled 2016-09-19: qty 10

## 2016-09-19 MED ORDER — HEPARIN SOD (PORK) LOCK FLUSH 100 UNIT/ML IV SOLN
500.0000 [IU] | Freq: Once | INTRAVENOUS | Status: AC
  Administered 2016-09-19: 500 [IU]
  Filled 2016-09-19: qty 5

## 2016-09-19 NOTE — Progress Notes (Signed)
Hematology and Oncology Follow Up Visit  Shane [name] [id number] 1936/03/19 81 y.o. 09/19/2016 1:20 PM Shane [name],Shane [name], MDMcGowen, Shane Blackwater, MD   Principle Diagnosis: 80 year old gentleman with invasive high-grade urothelial carcinoma of the bladder diagnosed in September 2017. He has T2 N0 disease.   Prior Therapy: He is status post TURBT on 04/02/2016 which confirmed the tumor invades into the muscularis propria. He has no evidence of metastatic disease based on his abdominal imaging as well as chest x-ray. This was completed on 04/02/2016. Neoadjuvant chemotherapy utilizing gemcitabine and cisplatin day 1 of cycle 1 of therapy was on 05/16/2016. He is status post 3 cycles of therapy completed in December 2017.  Current therapy: Under evaluation for primary surgical therapy.   Interim History: [name] presents today for a follow-up visit. Since the last visit, he continues to do very well without any residual complications related to chemotherapy. He denied any nausea, vomiting or peripheral neuropathy. He denied any hearing deficits. His appetite remain excellent and have gained weight He remains active and attends to activities of daily living. He denies any hematuria, dysuria or flank pain. He does not report any recent hospitalization or illnesses. He is scheduled for his bladder operation in early April 2018.  He does not report any headaches, blurry vision, syncope or seizures. He does not report any fevers or chills or sweats. He does not report any cough, wheezing or hemoptysis. He does not report any nausea, vomiting or abdominal pain. He does not report any constipation, diarrhea or change in his bowel habits. He does not report any frequency, urgency or hesitancy. He does not report any skeletal complaints of arthralgias or myalgias. The remaining review of systems unremarkable.    Medications: I have reviewed the patient's current medications.  Current Outpatient Prescriptions   Medication Sig Dispense Refill  . ferrous sulfate 325 (65 FE) MG EC tablet Take 1 tablet (325 mg total) by mouth daily with breakfast. 30 tablet 0  . lidocaine-prilocaine (EMLA) cream Apply 1 application topically as needed. Apply to port before chemotherapy. 30 g 0  . omega-3 acid ethyl esters (LOVAZA) 1 g capsule Take 1 capsule (1 g total) by mouth daily. 90 capsule 3  . pravastatin (PRAVACHOL) 20 MG tablet Take 1 tablet (20 mg total) by mouth daily. 90 tablet 3  . prochlorperazine (COMPAZINE) 10 MG tablet Take 1 tablet (10 mg total) by mouth every 6 (six) hours as needed for nausea or vomiting. 30 tablet 0  . sitaGLIPtin-metformin (JANUMET) 50-1000 MG tablet Take 1 tablet by mouth 2 (two) times daily with a meal. 180 tablet 3  . tamsulosin (FLOMAX) 0.4 MG CAPS capsule Take 1 capsule (0.4 mg total) by mouth daily after supper. 90 capsule 3   No current facility-administered medications for this visit.      Allergies: No Known Allergies  Past Medical History, Surgical history, Social history, and Family History were reviewed and updated.   Physical Exam: Blood pressure 138/65, pulse (!) 52, temperature 98.1 F (36.7 C), temperature source Oral, resp. rate 18, height 5' 7.5" (1.715 m), weight 209 lb 4.8 oz (94.9 kg), SpO2 100 %. ECOG: 1 General appearance: Well-appearing gentleman without distress. Head: Normocephalic, without obvious abnormality no rebound or guarding. Neck: no adenopathy Lymph nodes: Cervical, supraclavicular, and axillary nodes normal. Heart:regular rate and rhythm, S1, S2 normal, no murmur, click, rub or gallop Lung:chest clear, no wheezing, rales, normal symmetric air entry Abdomin: soft, non-tender, without masses or organomegaly no rebound or guarding. EXT:no  erythema, induration, or nodules   Lab Results: Lab Results  Component Value Date   WBC 5.7 09/19/2016   HGB 13.3 09/19/2016   HCT 38.5 09/19/2016   MCV 91.7 09/19/2016   PLT 166 09/19/2016      Chemistry      Component Value Date/Time   NA 141 07/23/2016 0855   K 5.3 No visable hemolysis ([name]) 07/23/2016 0855   CL 109 04/01/2016 0258   CO2 24 07/23/2016 0855   BUN 23.9 07/23/2016 0855   CREATININE 1.1 07/23/2016 0855      Component Value Date/Time   CALCIUM 9.9 07/23/2016 0855   ALKPHOS 59 07/23/2016 0855   AST 13 07/23/2016 0855   ALT 9 07/23/2016 0855   BILITOT 0.32 07/23/2016 5248        81 year old gentleman with the following issues:  1. Invasive high-grade urothelial carcinoma of the bladder after presenting with hematuria and difficulty urination in September 2017. He is status post TURBT on 04/02/2016 which confirmed the tumor invades into the muscularis propria. He has no evidence of metastatic disease based on his abdominal imaging as well as chest x-ray.  He is status post 3 cycles of chemotherapy utilizing gemcitabine and cisplatin I was well-tolerated.  CT scan chest abdomen and pelvis done on 07/23/2016 showed no evidence of advanced disease with decrease in the size of his bladder tumor.  The plan is to proceed with primary surgical therapy which is scheduled in April 2018. Upon completing his operation, however returned for follow-up discuss further management option. He will likely undergo active surveillance after that.   2. IV access: Port-A-Cath will continue to be flushed every 2 months after completing his surgery.  3. Follow-up: Will be in May 2018 to follow on his progress.  Zola Button, MD 3/9/20181:20 PM

## 2016-09-19 NOTE — Telephone Encounter (Signed)
Appointments scheduled per 09/19/16 los. Patient was given a copy of the AVS report and appointment schedule per 09/19/16 los.

## 2016-09-21 ENCOUNTER — Encounter: Payer: Self-pay | Admitting: Family Medicine

## 2016-10-12 HISTORY — PX: UMBILICAL HERNIA REPAIR: SHX196

## 2016-10-16 ENCOUNTER — Inpatient Hospital Stay (HOSPITAL_COMMUNITY)
Admission: RE | Admit: 2016-10-16 | Discharge: 2016-10-23 | DRG: 654 | Disposition: A | Discharge status: Home Health | Payer: Medicare Other | Source: Ambulatory Visit | Attending: Urology | Admitting: Urology

## 2016-10-16 ENCOUNTER — Encounter (HOSPITAL_COMMUNITY): Payer: Self-pay | Admitting: Anesthesiology

## 2016-10-16 DIAGNOSIS — I2510 Atherosclerotic heart disease of native coronary artery without angina pectoris: Secondary | ICD-10-CM | POA: Diagnosis present

## 2016-10-16 DIAGNOSIS — Z87891 Personal history of nicotine dependence: Secondary | ICD-10-CM

## 2016-10-16 DIAGNOSIS — C672 Malignant neoplasm of lateral wall of bladder: Secondary | ICD-10-CM | POA: Diagnosis not present

## 2016-10-16 DIAGNOSIS — C679 Malignant neoplasm of bladder, unspecified: Principal | ICD-10-CM | POA: Diagnosis present

## 2016-10-16 DIAGNOSIS — E119 Type 2 diabetes mellitus without complications: Secondary | ICD-10-CM | POA: Diagnosis not present

## 2016-10-16 DIAGNOSIS — R319 Hematuria, unspecified: Secondary | ICD-10-CM | POA: Diagnosis not present

## 2016-10-16 DIAGNOSIS — I10 Essential (primary) hypertension: Secondary | ICD-10-CM | POA: Diagnosis present

## 2016-10-16 DIAGNOSIS — Z833 Family history of diabetes mellitus: Secondary | ICD-10-CM

## 2016-10-16 DIAGNOSIS — K429 Umbilical hernia without obstruction or gangrene: Secondary | ICD-10-CM | POA: Diagnosis not present

## 2016-10-16 DIAGNOSIS — N3010 Interstitial cystitis (chronic) without hematuria: Secondary | ICD-10-CM | POA: Diagnosis not present

## 2016-10-16 DIAGNOSIS — E785 Hyperlipidemia, unspecified: Secondary | ICD-10-CM | POA: Diagnosis present

## 2016-10-16 DIAGNOSIS — K469 Unspecified abdominal hernia without obstruction or gangrene: Secondary | ICD-10-CM | POA: Diagnosis not present

## 2016-10-16 DIAGNOSIS — K567 Ileus, unspecified: Secondary | ICD-10-CM | POA: Diagnosis not present

## 2016-10-16 DIAGNOSIS — Z936 Other artificial openings of urinary tract status: Secondary | ICD-10-CM

## 2016-10-16 LAB — CBC
HCT: 37.6 % — ABNORMAL LOW (ref 39.0–52.0)
Hemoglobin: 13.1 g/dL (ref 13.0–17.0)
MCH: 30.5 pg (ref 26.0–34.0)
MCHC: 34.8 g/dL (ref 30.0–36.0)
MCV: 87.6 fL (ref 78.0–100.0)
Platelets: 181 10*3/uL (ref 150–400)
RBC: 4.29 MIL/uL (ref 4.22–5.81)
RDW: 12 % (ref 11.5–15.5)
WBC: 5.1 10*3/uL (ref 4.0–10.5)

## 2016-10-16 LAB — MRSA PCR SCREENING: MRSA by PCR: NEGATIVE

## 2016-10-16 LAB — COMPREHENSIVE METABOLIC PANEL
ALT: 12 U/L — ABNORMAL LOW (ref 17–63)
AST: 18 U/L (ref 15–41)
Albumin: 4 g/dL (ref 3.5–5.0)
Alkaline Phosphatase: 45 U/L (ref 38–126)
Anion gap: 7 (ref 5–15)
BUN: 22 mg/dL — ABNORMAL HIGH (ref 6–20)
CO2: 25 mmol/L (ref 22–32)
Calcium: 9.2 mg/dL (ref 8.9–10.3)
Chloride: 105 mmol/L (ref 101–111)
Creatinine, Ser: 1.01 mg/dL (ref 0.61–1.24)
GFR calc Af Amer: >60 mL/min (ref >60)
GFR calc non Af Amer: >60 mL/min (ref >60)
Glucose, Bld: 110 mg/dL — ABNORMAL HIGH (ref 65–99)
Potassium: 3.7 mmol/L (ref 3.5–5.1)
Sodium: 137 mmol/L (ref 135–145)
Total Bilirubin: 0.9 mg/dL (ref 0.3–1.2)
Total Protein: 7.2 g/dL (ref 6.5–8.1)

## 2016-10-16 LAB — PREPARE RBC (CROSSMATCH)

## 2016-10-16 MED ORDER — INSULIN ASPART 100 UNIT/ML ~~LOC~~ SOLN
3.0000 [IU] | Freq: Three times a day (TID) | SUBCUTANEOUS | Status: DC
  Administered 2016-10-20 – 2016-10-23 (×6): 3 [IU] via SUBCUTANEOUS

## 2016-10-16 MED ORDER — SODIUM CHLORIDE 0.9% FLUSH
10.0000 mL | INTRAVENOUS | Status: DC | PRN
  Administered 2016-10-16: 10 mL
  Filled 2016-10-16: qty 40

## 2016-10-16 MED ORDER — PRAVASTATIN SODIUM 20 MG PO TABS
20.0000 mg | ORAL_TABLET | Freq: Every day | ORAL | Status: DC
  Administered 2016-10-16 – 2016-10-23 (×7): 20 mg via ORAL
  Filled 2016-10-16 (×7): qty 1

## 2016-10-16 MED ORDER — INSULIN ASPART 100 UNIT/ML ~~LOC~~ SOLN
0.0000 [IU] | Freq: Three times a day (TID) | SUBCUTANEOUS | Status: DC
  Administered 2016-10-17: 3 [IU] via SUBCUTANEOUS
  Administered 2016-10-20 – 2016-10-21 (×2): 2 [IU] via SUBCUTANEOUS
  Administered 2016-10-21 – 2016-10-22 (×3): 3 [IU] via SUBCUTANEOUS
  Administered 2016-10-23: 2 [IU] via SUBCUTANEOUS
  Administered 2016-10-23: 5 [IU] via SUBCUTANEOUS

## 2016-10-16 MED ORDER — ALVIMOPAN 12 MG PO CAPS
12.0000 mg | ORAL_CAPSULE | Freq: Once | ORAL | Status: DC
  Filled 2016-10-16: qty 1

## 2016-10-16 MED ORDER — NEOMYCIN SULFATE 500 MG PO TABS
1000.0000 mg | ORAL_TABLET | Freq: Once | ORAL | Status: DC
  Filled 2016-10-16: qty 2

## 2016-10-16 MED ORDER — SODIUM CHLORIDE 0.9 % IV SOLN
INTRAVENOUS | Status: DC
Start: 2016-10-16 — End: 2016-10-17
  Administered 2016-10-16 – 2016-10-17 (×2): via INTRAVENOUS

## 2016-10-16 MED ORDER — PIPERACILLIN-TAZOBACTAM 3.375 G IVPB 30 MIN
3.3750 g | INTRAVENOUS | Status: AC
  Administered 2016-10-17: 3.375 g via INTRAVENOUS
  Filled 2016-10-16: qty 50

## 2016-10-16 MED ORDER — METRONIDAZOLE 500 MG PO TABS: 500.0000 mg | ORAL_TABLET | Freq: Once | ORAL | Status: DC

## 2016-10-16 MED ORDER — PEG 3350-KCL-NA BICARB-NACL 420 G PO SOLR
4000.0000 mL | Freq: Once | ORAL | Status: AC
  Administered 2016-10-16: 4000 mL via ORAL

## 2016-10-16 NOTE — H&P (Signed)
Shane [name] is an 81 y.o. male.    Chief Complaint: Pre-op Robotic Cystoprostatectomy with Umbilical Hernia Repair and Ileal Conduit Urinary Diversion  HPI:   1 - Muscle-Invasive Bladder Cancer - T2G3 large bladder cancer by cysto / TURBT 03/2016 by Junious Silk in eval impressive gross hematuira. CT and CXR 03/2016 without overt distant disease of pelvic adenopathy but significant stranding around left lateral bladder raising some suspicion of T3 disease. Had 3 cycles neo-adjuvant gem-cis under care of Dr. Alen Blew and restaging imaging 07/2016 withtout progressio.   2 - Bilateral Non-Complex Renal Cysts - bilateral peri-pelvic renal cysts by CT 03/2016. No enahncement / coarse calcifications / mass effect but does give false appearance of hydro on non-contrast series.   3 - Umbilical Hernia - reducible umbilical hernia on exam and imaging 2017, likely 3cm fascial defect w/o bowel.   PMH sig for DM2 (A1c< 7), Cystic pancreatic head lesion (repeat imaging pending around 09/2016). 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 His PCP is with [facility]. He is retired from [employer] having worked in [place] and abroad.   Today " Shane [name] " is seen as pre-op admission for bowel prep, stomal marking, labs before cystoprostatectomy tomorrow. No interval fevers.      Past Medical History:  Diagnosis Date  . Abnormal EKG 2014   This led to stress test which was abnl, which led to cardiology referral: cath showed small vessel dz of 2nd diag branch with 70-80% stenosis--preserved LV function  . Bilateral renal cysts    Non-complex (CT 03/2016): no enhancement, coarse calcifications, mass effect does give false appearance of hydro on non-contract series (followed by urol).  . Bladder cancer ([place]) 03/2016   Invasive high grade urothelial carcinoma.  No metastatic spread at time of dx.  Cystoscopy + bx and partial resection of tumor in hospital (when admitted  for gross hematuria and urinary retention).  Neoadjuvant chemo as of 06/2016, then to get surgery/cystoprostatectomy and adjuvant chemo.  CT C/A/P 07/23/16 showed no evidence of advanced disease with decrease in the size of his bladder tumor.  [name] CAD (coronary artery disease)    small vessel dz x 1 vessel on cath 2014: med mgmt  . Chronic calcific pancreatitis ([place]) 03/2016   CT: also small cyst in head of pancreas that needs 6 mo f/u imaging (09/2016)  . Diabetes mellitus without complication ([place])   . Heart murmur, systolic    Noted in prior PCP's records.  I recommended echocardiogram 07/2016 but pt declined.  [name] History of Bell's palsy   . Hyperlipidemia   . Hypertension   . Pancreas cyst 03/2016   Repeat imaging with MRI pancreas protocol 09/2016    Past Surgical History:  Procedure Laterality Date  . CARDIAC CATHETERIZATION  02/09/2013   small vessel dz of 2nd diag branch with 70-80% stenosis--preserved LV function. Medical therapy rec'd. Agustin Cree, PA)  . CARDIOVASCULAR STRESS TEST  01/26/2013   No ischemia.  Wall motion abnormalities at inferior base suggesting small area of infarction.  EF 64%.  [name] CATARACT EXTRACTION    . IR GENERIC HISTORICAL  05/12/2016   IR US GUIDE VASC ACCESS RIGHT 05/12/2016 Greggory Keen, MD [facility]  . IR GENERIC HISTORICAL  05/12/2016   IR FLUORO GUIDE PORT INSERTION RIGHT 05/12/2016 Greggory Keen, MD [facility]  . TONSILLECTOMY AND ADENOIDECTOMY  Remote  . TRANSURETHRAL RESECTION OF PROSTATE N/A 04/02/2016   Procedure:  CYSTOSCOPY, TRANSURETHRAL RESECTION OF BLADDER TUMOR;  Surgeon: [name], MD;  Location: [facility];  Service: Urology;  Laterality: N/A;  . VASECTOMY      Family History  Problem Relation Age of Onset  . Diabetes Mellitus I Mother   . Diabetes Mellitus I Sister    Social History:  reports that he has quit smoking. He quit after 3.00 years of use. He has never used smokeless tobacco. He reports that he drinks about 0.6 oz  of alcohol per week . He reports that he does not use drugs.  Allergies: No Known Allergies  Medications Prior to Admission  Medication Sig Dispense Refill  . clotrimazole (LOTRIMIN) 1 % cream Apply 1 application topically daily as needed (fungus).    [name] lidocaine-prilocaine (EMLA) cream Apply 1 application topically as needed. Apply to port before chemotherapy. 30 g 0  . lisinopril (PRINIVIL,ZESTRIL) 20 MG tablet Take 20 mg by mouth 2 (two) times daily.    [name] omega-3 acid ethyl esters (LOVAZA) 1 g capsule Take 1 capsule (1 g total) by mouth daily. 90 capsule 3  . pravastatin (PRAVACHOL) 20 MG tablet Take 1 tablet (20 mg total) by mouth daily. 90 tablet 3  . prochlorperazine (COMPAZINE) 10 MG tablet Take 1 tablet (10 mg total) by mouth every 6 (six) hours as needed for nausea or vomiting. 30 tablet 0  . sitaGLIPtin-metformin (JANUMET) 50-1000 MG tablet Take 1 tablet by mouth 2 (two) times daily with a meal. 180 tablet 3    Results for orders placed or performed during the hospital encounter of 10/16/16 (from the past 48 hour(s))  Prepare RBC (crossmatch)     Status: None   Collection Time: 10/16/16  5:06 PM  Result Value Ref Range   Order Confirmation ORDER PROCESSED BY BLOOD BANK   CBC     Status: Abnormal   Collection Time: 10/16/16  5:10 PM  Result Value Ref Range   WBC 5.1 4.0 - 10.5 K/uL   RBC 4.29 4.22 - 5.81 MIL/uL   Hemoglobin 13.1 13.0 - 17.0 g/dL   HCT 37.6 (L) 39.0 - 52.0 %   MCV 87.6 78.0 - 100.0 fL   MCH 30.5 26.0 - 34.0 pg   MCHC 34.8 30.0 - 36.0 g/dL   RDW 12.0 11.5 - 15.5 %   Platelets 181 150 - 400 K/uL  Comprehensive metabolic panel     Status: Abnormal   Collection Time: 10/16/16  5:10 PM  Result Value Ref Range   Sodium 137 135 - 145 mmol/L   Potassium 3.7 3.5 - 5.1 mmol/L   Chloride 105 101 - 111 mmol/L   CO2 25 22 - 32 mmol/L   Glucose, Bld 110 (H) 65 - 99 mg/dL   BUN 22 (H) 6 - 20 mg/dL   Creatinine, Ser 1.01 0.61 - 1.24 mg/dL   Calcium 9.2 8.9 - 10.3  mg/dL   Total Protein 7.2 6.5 - 8.1 g/dL   Albumin 4.0 3.5 - 5.0 g/dL   AST 18 15 - 41 U/L   ALT 12 (L) 17 - 63 U/L   Alkaline Phosphatase 45 38 - 126 U/L   Total Bilirubin 0.9 0.3 - 1.2 mg/dL   GFR calc non Af Amer >60 >60 mL/min   GFR calc Af Amer >60 >60 mL/min    Comment: (NOTE) The eGFR has been calculated using the CKD EPI equation. This calculation has not been validated in all clinical situations. eGFR's persistently <60 mL/min signify possible Chronic Kidney  Disease.    Anion gap 7 5 - 15  Type and screen [place]     Status: None   Collection Time: 10/16/16  5:10 PM  Result Value Ref Range   ABO/RH(D) A POS    Antibody Screen NEG    Sample Expiration 10/19/2016    No results found.  Review of Systems  Constitutional: Negative.  Negative for chills and fever.  HENT: Negative.   Eyes: Negative.   Respiratory: Negative.   Cardiovascular: Negative.   Gastrointestinal: Negative.   Genitourinary: Negative.   Musculoskeletal: Negative.   Skin: Negative.   Neurological: Negative.   Endo/Heme/Allergies: Negative.   Psychiatric/Behavioral: Negative.     Blood pressure 137/75, pulse 65, temperature 97.9 F (36.6 C), temperature source Oral, resp. rate 18, height _0  (1.727 m), weight 92.1 kg (203 lb 0.7 oz), SpO2 99 %. Physical Exam  Constitutional: He appears well-developed.  Wife at bedside  HENT:  Head: Normocephalic.  Eyes: Pupils are equal, round, and reactive to light.  Neck: Normal range of motion.  Cardiovascular: Normal rate.   Respiratory: Effort normal.  GI: Soft.  Reducible umbilical hernia.   Genitourinary:  Genitourinary Comments: No CVAT.   Musculoskeletal: Normal range of motion.  Neurological: He is alert.  Skin: Skin is warm.  Psychiatric: He has a normal mood and affect. His behavior is normal. Judgment and thought content normal.     Assessment/Plan  Proceed as planned with cystoprostatectomy tomorrow. Risks,  benefits, alternatives, expected peri-op course with approx 7 day hospital stay discussed previously and reiterated today.   Alexis Frock, MD 10/16/2016, 9:02 PM

## 2016-10-16 NOTE — Anesthesia Preprocedure Evaluation (Addendum)
Anesthesia Evaluation  Patient identified by MRN, date of birth, ID band Patient awake    Reviewed: Allergy & Precautions, NPO status , Patient's Chart, lab work & pertinent test results  Airway Mallampati: I  TM Distance: >3 FB Neck ROM: Full    Dental  (+) Edentulous Upper, Edentulous Lower   Pulmonary neg pulmonary ROS, former smoker,    breath sounds clear to auscultation       Cardiovascular hypertension, Pt. on medications + CAD  negative cardio ROS  + Valvular Problems/Murmurs  Rhythm:Regular Rate:Normal     Neuro/Psych negative neurological ROS  negative psych ROS   GI/Hepatic negative GI ROS, Neg liver ROS,   Endo/Other  negative endocrine ROSdiabetes  Renal/GU Renal disease  negative genitourinary   Musculoskeletal negative musculoskeletal ROS (+)   Abdominal   Peds negative pediatric ROS (+)  Hematology negative hematology ROS (+)   Anesthesia Other Findings Day of surgery medications reviewed with the patient.  Reproductive/Obstetrics negative OB ROS                            Lab Results  Component Value Date   WBC 5.7 09/19/2016   HGB 13.3 09/19/2016   HCT 38.5 09/19/2016   MCV 91.7 09/19/2016   PLT 166 09/19/2016   Lab Results  Component Value Date   CREATININE 0.9 09/19/2016   BUN 19.6 09/19/2016   NA 140 09/19/2016   K 4.3 09/19/2016   CL 109 04/01/2016   CO2 22 09/19/2016   Lab Results  Component Value Date   INR 1.05 05/12/2016   INR 1.09 04/01/2016   EKG: normal sinus rhythm.  Anesthesia Physical Anesthesia Plan  ASA: III  Anesthesia Plan: General   Post-op Pain Management:    Induction: Intravenous  Airway Management Planned: Oral ETT  Additional Equipment: Arterial line  Intra-op Plan:   Post-operative Plan: Possible Post-op intubation/ventilation  Informed Consent: I have reviewed the patients History and Physical, chart, labs and  discussed the procedure including the risks, benefits and alternatives for the proposed anesthesia with the patient or authorized representative who has indicated his/her understanding and acceptance.   Dental advisory given  Plan Discussed with: CRNA  Anesthesia Plan Comments:        Anesthesia Quick Evaluation

## 2016-10-17 ENCOUNTER — Encounter (HOSPITAL_COMMUNITY): Payer: Self-pay | Admitting: Certified Registered Nurse Anesthetist

## 2016-10-17 ENCOUNTER — Inpatient Hospital Stay (HOSPITAL_COMMUNITY): Payer: Medicare Other | Admitting: Anesthesiology

## 2016-10-17 ENCOUNTER — Encounter (HOSPITAL_COMMUNITY)
Admission: RE | Disposition: A | Discharge status: Home Health | Payer: Self-pay | Source: Ambulatory Visit | Attending: Urology

## 2016-10-17 DIAGNOSIS — Z936 Other artificial openings of urinary tract status: Secondary | ICD-10-CM

## 2016-10-17 HISTORY — PX: OTHER SURGICAL HISTORY: SHX169

## 2016-10-17 HISTORY — PX: CYSTOSCOPY WITH INJECTION: SHX1424

## 2016-10-17 LAB — HEMOGLOBIN AND HEMATOCRIT, BLOOD
HCT: 36.6 % — ABNORMAL LOW (ref 39.0–52.0)
Hemoglobin: 12.4 g/dL — ABNORMAL LOW (ref 13.0–17.0)

## 2016-10-17 LAB — GLUCOSE, CAPILLARY
Glucose-Capillary: 113 mg/dL — ABNORMAL HIGH (ref 65–99)
Glucose-Capillary: 140 mg/dL — ABNORMAL HIGH (ref 65–99)
Glucose-Capillary: 187 mg/dL — ABNORMAL HIGH (ref 65–99)
Glucose-Capillary: 132 mg/dL — ABNORMAL HIGH (ref 65–99)

## 2016-10-17 LAB — ABO/RH: ABO/RH(D): A POS

## 2016-10-17 SURGERY — ROBOT ASSISTED LAPAROSCOPIC RADICAL CYSTOPROSTATECTOMY BILATERAL PELVIC LYMPHADENECTOMY,ORTHOTOPIC NEOBLADDER
Anesthesia: General

## 2016-10-17 MED ORDER — MORPHINE SULFATE 2 MG/ML IV SOLN
INTRAVENOUS | Status: DC
  Administered 2016-10-17: 7.5 mg via INTRAVENOUS
  Administered 2016-10-17: 17:00:00 via INTRAVENOUS
  Administered 2016-10-18: 1.5 mg via INTRAVENOUS
  Administered 2016-10-18: 4.5 mg via INTRAVENOUS
  Administered 2016-10-18 (×2): 3 mg via INTRAVENOUS
  Administered 2016-10-18: 4.5 mg via INTRAVENOUS
  Administered 2016-10-18 – 2016-10-19 (×2): 0 mg via INTRAVENOUS
  Administered 2016-10-19: 1.5 mg via INTRAVENOUS
  Administered 2016-10-19: 7.5 mg via INTRAVENOUS
  Administered 2016-10-19: 3 mg via INTRAVENOUS
  Administered 2016-10-19 (×2): 0 mg via INTRAVENOUS
  Administered 2016-10-20: 1.5 mg via INTRAVENOUS
  Administered 2016-10-20: 4.5 mg via INTRAVENOUS
  Filled 2016-10-17: qty 30

## 2016-10-17 MED ORDER — LACTATED RINGERS IV SOLN: INTRAVENOUS | Status: DC

## 2016-10-17 MED ORDER — ACETAMINOPHEN 10 MG/ML IV SOLN
1000.0000 mg | Freq: Four times a day (QID) | INTRAVENOUS | Status: DC
  Administered 2016-10-17 – 2016-10-18 (×2): 1000 mg via INTRAVENOUS
  Filled 2016-10-17 (×4): qty 100

## 2016-10-17 MED ORDER — MEPERIDINE HCL 50 MG/ML IJ SOLN: 6.2500 mg | INTRAMUSCULAR | Status: DC | PRN

## 2016-10-17 MED ORDER — ROCURONIUM BROMIDE 10 MG/ML (PF) SYRINGE
PREFILLED_SYRINGE | INTRAVENOUS | Status: DC | PRN
  Administered 2016-10-17: 10 mg via INTRAVENOUS
  Administered 2016-10-17: 20 mg via INTRAVENOUS
  Administered 2016-10-17: 10 mg via INTRAVENOUS
  Administered 2016-10-17: 20 mg via INTRAVENOUS
  Administered 2016-10-17 (×2): 10 mg via INTRAVENOUS
  Administered 2016-10-17: 50 mg via INTRAVENOUS

## 2016-10-17 MED ORDER — LIDOCAINE 2% (20 MG/ML) 5 ML SYRINGE
INTRAMUSCULAR | Status: AC
  Filled 2016-10-17: qty 5

## 2016-10-17 MED ORDER — ROCURONIUM BROMIDE 50 MG/5ML IV SOSY
PREFILLED_SYRINGE | INTRAVENOUS | Status: AC
  Filled 2016-10-17: qty 5

## 2016-10-17 MED ORDER — LACTATED RINGERS IR SOLN
Status: DC | PRN
  Administered 2016-10-17: 1000 mL

## 2016-10-17 MED ORDER — SODIUM CHLORIDE 0.9% FLUSH: 9.0000 mL | INTRAVENOUS | Status: DC | PRN

## 2016-10-17 MED ORDER — PROPOFOL 10 MG/ML IV BOLUS
INTRAVENOUS | Status: AC
  Filled 2016-10-17: qty 40

## 2016-10-17 MED ORDER — SUGAMMADEX SODIUM 200 MG/2ML IV SOLN
INTRAVENOUS | Status: DC | PRN
  Administered 2016-10-17: 200 mg via INTRAVENOUS

## 2016-10-17 MED ORDER — FENTANYL CITRATE (PF) 250 MCG/5ML IJ SOLN
INTRAMUSCULAR | Status: AC
  Filled 2016-10-17: qty 5

## 2016-10-17 MED ORDER — PHENYLEPHRINE 40 MCG/ML (10ML) SYRINGE FOR IV PUSH (FOR BLOOD PRESSURE SUPPORT)
PREFILLED_SYRINGE | INTRAVENOUS | Status: DC | PRN
  Administered 2016-10-17 (×5): 80 ug via INTRAVENOUS

## 2016-10-17 MED ORDER — EPHEDRINE SULFATE-NACL 50-0.9 MG/10ML-% IV SOSY
PREFILLED_SYRINGE | INTRAVENOUS | Status: DC | PRN
  Administered 2016-10-17: 5 mg via INTRAVENOUS
  Administered 2016-10-17: 10 mg via INTRAVENOUS
  Administered 2016-10-17: 5 mg via INTRAVENOUS

## 2016-10-17 MED ORDER — LACTATED RINGERS IV SOLN
INTRAVENOUS | Status: DC | PRN
Start: 2016-10-17 — End: 2016-10-17
  Administered 2016-10-17 (×2): via INTRAVENOUS

## 2016-10-17 MED ORDER — HYDROMORPHONE HCL 1 MG/ML IJ SOLN: 0.2500 mg | INTRAMUSCULAR | Status: DC | PRN

## 2016-10-17 MED ORDER — BUPIVACAINE LIPOSOME 1.3 % IJ SUSP
INTRAMUSCULAR | Status: DC | PRN
  Administered 2016-10-17: 20 mL

## 2016-10-17 MED ORDER — SODIUM CHLORIDE 0.9 % IJ SOLN
INTRAMUSCULAR | Status: AC
  Filled 2016-10-17: qty 50

## 2016-10-17 MED ORDER — HYDROMORPHONE HCL 1 MG/ML IJ SOLN
0.2500 mg | INTRAMUSCULAR | Status: DC | PRN
  Administered 2016-10-17: 0.5 mg via INTRAVENOUS
  Administered 2016-10-17: 0.25 mg via INTRAVENOUS

## 2016-10-17 MED ORDER — PROPOFOL 10 MG/ML IV BOLUS
INTRAVENOUS | Status: DC | PRN
  Administered 2016-10-17: 140 mg via INTRAVENOUS

## 2016-10-17 MED ORDER — EPHEDRINE 5 MG/ML INJ
INTRAVENOUS | Status: AC
  Filled 2016-10-17: qty 10

## 2016-10-17 MED ORDER — NALOXONE HCL 0.4 MG/ML IJ SOLN: 0.4000 mg | INTRAMUSCULAR | Status: DC | PRN

## 2016-10-17 MED ORDER — ALVIMOPAN 12 MG PO CAPS
12.0000 mg | ORAL_CAPSULE | Freq: Two times a day (BID) | ORAL | Status: DC
  Administered 2016-10-18 – 2016-10-21 (×7): 12 mg via ORAL
  Filled 2016-10-17 (×8): qty 1

## 2016-10-17 MED ORDER — ONDANSETRON HCL 4 MG/2ML IJ SOLN
INTRAMUSCULAR | Status: DC | PRN
  Administered 2016-10-17: 4 mg via INTRAVENOUS

## 2016-10-17 MED ORDER — BUPIVACAINE LIPOSOME 1.3 % IJ SUSP
20.0000 mL | Freq: Once | INTRAMUSCULAR | Status: DC
  Filled 2016-10-17: qty 20

## 2016-10-17 MED ORDER — PIPERACILLIN-TAZOBACTAM 3.375 G IVPB
INTRAVENOUS | Status: AC
  Filled 2016-10-17: qty 50

## 2016-10-17 MED ORDER — PHENOL 1.4 % MT LIQD
1.0000 | OROMUCOSAL | Status: DC | PRN
  Filled 2016-10-17: qty 177

## 2016-10-17 MED ORDER — HYDROMORPHONE HCL 1 MG/ML IJ SOLN
INTRAMUSCULAR | Status: AC
  Filled 2016-10-17: qty 1

## 2016-10-17 MED ORDER — SODIUM CHLORIDE 0.9 % IR SOLN
Status: DC | PRN
  Administered 2016-10-17: 1000 mL via INTRAVESICAL

## 2016-10-17 MED ORDER — SODIUM CHLORIDE 0.9 % IJ SOLN
INTRAMUSCULAR | Status: DC | PRN
  Administered 2016-10-17: 20 mL

## 2016-10-17 MED ORDER — PIPERACILLIN-TAZOBACTAM 3.375 G IVPB
3.3750 g | Freq: Three times a day (TID) | INTRAVENOUS | Status: AC
  Administered 2016-10-17 – 2016-10-20 (×9): 3.375 g via INTRAVENOUS
  Filled 2016-10-17 (×10): qty 50

## 2016-10-17 MED ORDER — DIPHENHYDRAMINE HCL 50 MG/ML IJ SOLN: 12.5000 mg | Freq: Four times a day (QID) | INTRAMUSCULAR | Status: DC | PRN

## 2016-10-17 MED ORDER — DIPHENHYDRAMINE HCL 12.5 MG/5ML PO ELIX: 12.5000 mg | ORAL_SOLUTION | Freq: Four times a day (QID) | ORAL | Status: DC | PRN

## 2016-10-17 MED ORDER — SODIUM CHLORIDE 0.9 % IV SOLN
INTRAVENOUS | Status: DC
  Administered 2016-10-17 – 2016-10-18 (×2): via INTRAVENOUS

## 2016-10-17 MED ORDER — PROMETHAZINE HCL 25 MG/ML IJ SOLN: 6.2500 mg | INTRAMUSCULAR | Status: DC | PRN

## 2016-10-17 MED ORDER — LIDOCAINE 2% (20 MG/ML) 5 ML SYRINGE
INTRAMUSCULAR | Status: DC | PRN
  Administered 2016-10-17: 60 mg via INTRAVENOUS

## 2016-10-17 MED ORDER — ONDANSETRON HCL 4 MG/2ML IJ SOLN
4.0000 mg | Freq: Four times a day (QID) | INTRAMUSCULAR | Status: DC | PRN
  Administered 2016-10-19: 4 mg via INTRAVENOUS
  Filled 2016-10-17: qty 2

## 2016-10-17 MED ORDER — LACTATED RINGERS IV SOLN
INTRAVENOUS | Status: DC | PRN
  Administered 2016-10-17: 12:00:00 via INTRAVENOUS

## 2016-10-17 MED ORDER — MENTHOL 3 MG MT LOZG: 1.0000 | LOZENGE | OROMUCOSAL | Status: DC | PRN

## 2016-10-17 MED ORDER — FENTANYL CITRATE (PF) 100 MCG/2ML IJ SOLN
INTRAMUSCULAR | Status: DC | PRN
  Administered 2016-10-17 (×8): 50 ug via INTRAVENOUS
  Administered 2016-10-17: 100 ug via INTRAVENOUS

## 2016-10-17 SURGICAL SUPPLY — 87 items
APPLICATOR COTTON TIP 6IN STRL (MISCELLANEOUS) ×3 IMPLANT
APPLICATOR SURGIFLO ENDO (HEMOSTASIS) IMPLANT
BAG LAPAROSCOPIC 12 15 PORT 16 (BASKET) ×1 IMPLANT
BAG RETRIEVAL 12/15 (BASKET) ×2
BAG RETRIEVAL 12/15MM (BASKET) ×1
BAG URO CATCHER STRL LF (MISCELLANEOUS) IMPLANT
BLADE SURG SZ10 CARB STEEL (BLADE) IMPLANT
CATH FOLEY 2WAY SLVR 18FR 30CC (CATHETERS) ×9 IMPLANT
CELLS DAT CNTRL 66122 CELL SVR (MISCELLANEOUS) ×1 IMPLANT
CHLORAPREP W/TINT 26ML (MISCELLANEOUS) ×3 IMPLANT
CLIP LIGATING HEM O LOK PURPLE (MISCELLANEOUS) ×18 IMPLANT
CLIP LIGATING HEMO LOK XL GOLD (MISCELLANEOUS) ×3 IMPLANT
CLIP LIGATING HEMO O LOK GREEN (MISCELLANEOUS) ×3 IMPLANT
CLOTH BEACON ORANGE TIMEOUT ST (SAFETY) ×3 IMPLANT
COVER SURGICAL LIGHT HANDLE (MISCELLANEOUS) ×3 IMPLANT
COVER TIP SHEARS 8 DVNC (MISCELLANEOUS) ×1 IMPLANT
COVER TIP SHEARS 8MM DA VINCI (MISCELLANEOUS) ×2
DECANTER SPIKE VIAL GLASS SM (MISCELLANEOUS) IMPLANT
DERMABOND ADVANCED (GAUZE/BANDAGES/DRESSINGS) ×4
DERMABOND ADVANCED .7 DNX12 (GAUZE/BANDAGES/DRESSINGS) ×2 IMPLANT
DRAIN PENROSE 18X1/2 LTX STRL (DRAIN) IMPLANT
DRAPE ARM DVNC X/XI (DISPOSABLE) ×4 IMPLANT
DRAPE COLUMN DVNC XI (DISPOSABLE) ×1 IMPLANT
DRAPE DA VINCI XI ARM (DISPOSABLE) ×8
DRAPE DA VINCI XI COLUMN (DISPOSABLE) ×2
ELECT CAUTERY BLADE 6.4 (BLADE) ×3 IMPLANT
ELECT REM PT RETURN 15FT ADLT (MISCELLANEOUS) ×3 IMPLANT
GLOVE BIO SURGEON STRL SZ 6.5 (GLOVE) ×4 IMPLANT
GLOVE BIO SURGEONS STRL SZ 6.5 (GLOVE) ×2
GLOVE BIOGEL M STRL SZ7.5 (GLOVE) ×9 IMPLANT
GOWN STRL REUS W/TWL LRG LVL3 (GOWN DISPOSABLE) ×15 IMPLANT
GOWN STRL REUS W/TWL XL LVL3 (GOWN DISPOSABLE) ×3 IMPLANT
IRRIG SUCT STRYKERFLOW 2 WTIP (MISCELLANEOUS) ×3
IRRIGATION SUCT STRKRFLW 2 WTP (MISCELLANEOUS) ×1 IMPLANT
KIT PROCEDURE DA VINCI SI (MISCELLANEOUS)
KIT PROCEDURE DVNC SI (MISCELLANEOUS) IMPLANT
LOOP MINI RED (MISCELLANEOUS) IMPLANT
LOOP VESSEL MAXI BLUE (MISCELLANEOUS) ×3 IMPLANT
MANIFOLD NEPTUNE II (INSTRUMENTS) ×3 IMPLANT
NEEDLE INSUFFLATION 14GA 120MM (NEEDLE) ×3 IMPLANT
PACK CYSTO (CUSTOM PROCEDURE TRAY) ×3 IMPLANT
PACK ROBOT UROLOGY CUSTOM (CUSTOM PROCEDURE TRAY) ×3 IMPLANT
PAD POSITIONING PINK XL (MISCELLANEOUS) ×3 IMPLANT
PORT ACCESS TROCAR AIRSEAL 12 (TROCAR) ×1 IMPLANT
PORT ACCESS TROCAR AIRSEAL 5M (TROCAR) ×2
RELOAD STAPLER GREEN 60MM (STAPLE) ×6 IMPLANT
RELOAD STAPLER WHITE 60MM (STAPLE) ×5 IMPLANT
RETRACTOR LONRSTAR 16.6X16.6CM (MISCELLANEOUS) IMPLANT
RETRACTOR STAY HOOK 5MM (MISCELLANEOUS) IMPLANT
RETRACTOR STER APS 16.6X16.6CM (MISCELLANEOUS)
RTRCTR WOUND ALEXIS 18CM MED (MISCELLANEOUS) ×3
SEAL CANN UNIV 5-8 DVNC XI (MISCELLANEOUS) ×4 IMPLANT
SEAL XI 5MM-8MM UNIVERSAL (MISCELLANEOUS) ×8
SOLUTION ELECTROLUBE (MISCELLANEOUS) ×3 IMPLANT
SPONGE LAP 18X18 X RAY DECT (DISPOSABLE) ×6 IMPLANT
SPONGE LAP 4X18 X RAY DECT (DISPOSABLE) ×3 IMPLANT
STAPLER ECHELON LONG 60 440 (INSTRUMENTS) ×3 IMPLANT
STAPLER RELOAD GREEN 60MM (STAPLE) ×18
STAPLER RELOAD WHITE 60MM (STAPLE) ×15
STENT SET URETHERAL LEFT 7FR (STENTS) ×3 IMPLANT
STENT SET URETHERAL RIGHT 7FR (STENTS) ×3 IMPLANT
SURGIFLO W/THROMBIN 8M KIT (HEMOSTASIS) IMPLANT
SUT CHROMIC 4 0 RB 1X27 (SUTURE) ×3 IMPLANT
SUT ETHIBOND 0 (SUTURE) ×9 IMPLANT
SUT ETHILON 3 0 PS 1 (SUTURE) ×3 IMPLANT
SUT MNCRL AB 4-0 PS2 18 (SUTURE) ×6 IMPLANT
SUT PDS AB 0 CTX 36 PDP370T (SUTURE) ×9 IMPLANT
SUT SILK 3 0 SH 30 (SUTURE) IMPLANT
SUT SILK 3 0 SH CR/8 (SUTURE) ×3 IMPLANT
SUT VIC AB 2-0 SH 18 (SUTURE) IMPLANT
SUT VIC AB 2-0 UR5 27 (SUTURE) ×12 IMPLANT
SUT VIC AB 3-0 SH 27 (SUTURE) ×8
SUT VIC AB 3-0 SH 27X BRD (SUTURE) ×1 IMPLANT
SUT VIC AB 3-0 SH 27XBRD (SUTURE) ×3 IMPLANT
SUT VIC AB 4-0 RB1 27 (SUTURE) ×8
SUT VIC AB 4-0 RB1 27XBRD (SUTURE) ×4 IMPLANT
SUT VLOC BARB 180 ABS3/0GR12 (SUTURE) ×3
SUTURE VLOC BRB 180 ABS3/0GR12 (SUTURE) ×1 IMPLANT
SYR CONTROL 10ML LL (SYRINGE) IMPLANT
SYSTEM UROSTOMY GENTLE TOUCH (WOUND CARE) ×3 IMPLANT
TOWEL OR NON WOVEN STRL DISP B (DISPOSABLE) ×3 IMPLANT
TROCAR BLADELESS 15MM (ENDOMECHANICALS) ×3 IMPLANT
TUBING CONNECTING 10 (TUBING) IMPLANT
TUBING CONNECTING 10' (TUBING)
WATER STERILE IRR 1500ML POUR (IV SOLUTION) ×6 IMPLANT
WATER STERILE IRR 3000ML UROMA (IV SOLUTION) ×3 IMPLANT
YANKAUER SUCT BULB TIP 10FT TU (MISCELLANEOUS) IMPLANT

## 2016-10-17 NOTE — H&P (View-Only) (Signed)
Day of Surgery  Subjective:  1 - Muscle-Invasive Bladder Cancer - T2G3 large bladder cancer by cysto / TURBT 03/2016 by Junious Silk in eval impressive gross hematuira. CT and CXR 03/2016 without overt distant disease of pelvic adenopathy but significant stranding around left lateral bladder raising some suspicion of T3 disease. Had 3 cycles neo-adjuvant gem-cis under care of Dr. Alen Blew and restaging imaging 07/2016 withtout progressio.   2 - Bilateral Non-Complex Renal Cysts - bilateral peri-pelvic renal cysts by CT 03/2016. No enahncement / coarse calcifications / mass effect but does give false appearance of hydro on non-contrast series.   3 - Umbilical Hernia - reducible umbilical hernia on exam and imaging 2017, likely 3cm fascial defect w/o bowel.    Today " Shane [name] " is seen to proceed with cystoprostatecotmy + hernia repair. He completed bowel prep to clear and had PO ABX as well as stomal marking.    Objective: Vital signs in last 24 hours: Temp:  [97.8 F (36.6 C)-98.4 F (36.9 C)] 98.4 F (36.9 C) (04/06 0437) Pulse Rate:  [62-65] 65 (04/06 0437) Resp:  [18] 18 (04/06 0437) BP: (130-137)/(72-91) 130/72 (04/06 0437) SpO2:  [98 %-99 %] 98 % (04/06 0437) Weight:  [92.1 kg (203 lb 0.7 oz)] 92.1 kg (203 lb 0.7 oz) (04/06 0706) Last BM Date: 10/16/16  Intake/Output from previous day: 04/05 0701 - 04/06 0700 In: 650.8 [P.O.:240; I.V.:410.8] Out: -  Intake/Output this shift: No intake/output data recorded.  General appearance: alert, cooperative and appears stated age Eyes: negative Nose: Nares normal. Septum midline. Mucosa normal. No drainage or sinus tenderness. Throat: lips, mucosa, and tongue normal; teeth and gums normal Neck: supple, symmetrical, trachea midline Back: symmetric, no curvature. ROM normal. No CVA tenderness. Resp: non-labored on room air. Cardio: regular rate and rhythm, S1, S2 normal, no murmur, click, rub or gallop GI: soft, non-tender; bowel sounds normal; no  masses,  no organomegaly Extremities: extremities normal, atraumatic, no cyanosis or edema Pulses: 2+ and symmetric Skin: Skin color, texture, turgor normal. No rashes or lesions Lymph nodes: Cervical, supraclavicular, and axillary nodes normal. Neurologic: Grossly normal Stable reducible umbilical hernia.   Lab Results:   Recent Labs  10/16/16 1710  WBC 5.1  HGB 13.1  HCT 37.6*  PLT 181   BMET  Recent Labs  10/16/16 1710  NA 137  K 3.7  CL 105  CO2 25  GLUCOSE 110*  BUN 22*  CREATININE 1.01  CALCIUM 9.2   PT/INR No results for input(s): LABPROT, INR in the last 72 hours. ABG No results for input(s): PHART, HCO3 in the last 72 hours.  Invalid input(s): PCO2, PO2  Studies/Results: No results found.  Anti-infectives: Anti-infectives    Start     Dose/Rate Route Frequency Ordered Stop   10/17/16 0800  [MAR Hold]  neomycin (MYCIFRADIN) tablet 1,000 mg     (MAR Hold since 10/17/16 0646)   1,000 mg Oral  Once 10/16/16 2114     10/17/16 0800  [MAR Hold]  metroNIDAZOLE (FLAGYL) tablet 500 mg     (MAR Hold since 10/17/16 0646)   500 mg Oral  Once 10/16/16 2114     10/17/16 0702  piperacillin-tazobactam (ZOSYN) 3.375 GM/50ML IVPB    Comments:  Danley Danker   : cabinet override      10/17/16 0702 10/17/16 1914   10/17/16 0600  piperacillin-tazobactam (ZOSYN) IVPB 3.375 g     3.375 g 100 mL/hr over 30 Minutes Intravenous 30 min pre-op 10/16/16 1618  Assessment/Plan:  Proceed as planned with cystoprostatecotmy today as planned. He has very good understanding of risks, benefits, alternatives, and expected peri-op course.   [facility], Jonus Coble 10/17/2016

## 2016-10-17 NOTE — Consult Note (Signed)
[facility] Nurse requested for preoperative stoma site marking  Discussed surgical procedure and stoma creation with patient and family.  Explained role of the [facility] nurse team.  Provided the patient with educational booklet and provided samples of pouching options.  Answered patient and family questions.   Examined patient lying, sitting, and standing in order to place the marking in the patient's visual field, away from any creases or abdominal contour issues and within the rectus muscle. Marked above the patient's belt line, he wears his pants low with suspenders, tried to avoid where the suspenders will fall as well.   Marked for ileal conduit in the RLQ 7  cm to the right of the umbilicus and  1.5 cm above the umbilicus.   Patient had received CHG bath just prior to my arrival.Covered mark with thin film transparent dressing   [facility] Nurse team will follow up with patient after surgery for continue ostomy care and teaching.  Jamisha Hoeschen Cruger RN,CWOCN [phone]

## 2016-10-17 NOTE — Progress Notes (Signed)
Day of Surgery  Subjective:  1 - Muscle-Invasive Bladder Cancer - T2G3 large bladder cancer by cysto / TURBT 03/2016 by Junious Silk in eval impressive gross hematuira. CT and CXR 03/2016 without overt distant disease of pelvic adenopathy but significant stranding around left lateral bladder raising some suspicion of T3 disease. Had 3 cycles neo-adjuvant gem-cis under care of Dr. Alen Blew and restaging imaging 07/2016 withtout progressio.   2 - Bilateral Non-Complex Renal Cysts - bilateral peri-pelvic renal cysts by CT 03/2016. No enahncement / coarse calcifications / mass effect but does give false appearance of hydro on non-contrast series.   3 - Umbilical Hernia - reducible umbilical hernia on exam and imaging 2017, likely 3cm fascial defect w/o bowel.    Today " Shane [name] " is seen to proceed with cystoprostatecotmy + hernia repair. He completed bowel prep to clear and had PO ABX as well as stomal marking.    Objective: Vital signs in last 24 hours: Temp:  [97.8 F (36.6 C)-98.4 F (36.9 C)] 98.4 F (36.9 C) (04/06 0437) Pulse Rate:  [62-65] 65 (04/06 0437) Resp:  [18] 18 (04/06 0437) BP: (130-137)/(72-91) 130/72 (04/06 0437) SpO2:  [98 %-99 %] 98 % (04/06 0437) Weight:  [92.1 kg (203 lb 0.7 oz)] 92.1 kg (203 lb 0.7 oz) (04/06 0706) Last BM Date: 10/16/16  Intake/Output from previous day: 04/05 0701 - 04/06 0700 In: 650.8 [P.O.:240; I.V.:410.8] Out: -  Intake/Output this shift: No intake/output data recorded.  General appearance: alert, cooperative and appears stated age Eyes: negative Nose: Nares normal. Septum midline. Mucosa normal. No drainage or sinus tenderness. Throat: lips, mucosa, and tongue normal; teeth and gums normal Neck: supple, symmetrical, trachea midline Back: symmetric, no curvature. ROM normal. No CVA tenderness. Resp: non-labored on room air. Cardio: regular rate and rhythm, S1, S2 normal, no murmur, click, rub or gallop GI: soft, non-tender; bowel sounds normal; no  masses,  no organomegaly Extremities: extremities normal, atraumatic, no cyanosis or edema Pulses: 2+ and symmetric Skin: Skin color, texture, turgor normal. No rashes or lesions Lymph nodes: Cervical, supraclavicular, and axillary nodes normal. Neurologic: Grossly normal Stable reducible umbilical hernia.   Lab Results:   Recent Labs  10/16/16 1710  WBC 5.1  HGB 13.1  HCT 37.6*  PLT 181   BMET  Recent Labs  10/16/16 1710  NA 137  K 3.7  CL 105  CO2 25  GLUCOSE 110*  BUN 22*  CREATININE 1.01  CALCIUM 9.2   PT/INR No results for input(s): LABPROT, INR in the last 72 hours. ABG No results for input(s): PHART, HCO3 in the last 72 hours.  Invalid input(s): PCO2, PO2  Studies/Results: No results found.  Anti-infectives: Anti-infectives    Start     Dose/Rate Route Frequency Ordered Stop   10/17/16 0800  [MAR Hold]  neomycin (MYCIFRADIN) tablet 1,000 mg     (MAR Hold since 10/17/16 0646)   1,000 mg Oral  Once 10/16/16 2114     10/17/16 0800  [MAR Hold]  metroNIDAZOLE (FLAGYL) tablet 500 mg     (MAR Hold since 10/17/16 0646)   500 mg Oral  Once 10/16/16 2114     10/17/16 0702  piperacillin-tazobactam (ZOSYN) 3.375 GM/50ML IVPB    Comments:  Danley Danker   : cabinet override      10/17/16 0702 10/17/16 1914   10/17/16 0600  piperacillin-tazobactam (ZOSYN) IVPB 3.375 g     3.375 g 100 mL/hr over 30 Minutes Intravenous 30 min pre-op 10/16/16 1618  Assessment/Plan:  Proceed as planned with cystoprostatecotmy today as planned. He has very good understanding of risks, benefits, alternatives, and expected peri-op course.   [facility], Nelli Swalley 10/17/2016

## 2016-10-17 NOTE — Anesthesia Procedure Notes (Signed)
Arterial Line Insertion Start/End4/12/2016 7:12 AM, 10/17/2016 7:15 AM Performed by: Suella Broad D, anesthesiologist  Patient location: Pre-op. Preanesthetic checklist: patient identified, IV checked, site marked, risks and benefits discussed, surgical consent, monitors and equipment checked, pre-op evaluation, timeout performed and anesthesia consent Lidocaine 1% used for infiltration radial was placed Catheter size: 20 Fr Hand hygiene performed  and maximum sterile barriers used   Attempts: 1 Procedure performed without using ultrasound guided technique. Following insertion, dressing applied and Biopatch. Post procedure assessment: normal and unchanged  Patient tolerated the procedure well with no immediate complications.

## 2016-10-17 NOTE — Discharge Summary (Addendum)
Physician Discharge Summary  Patient ID: Shane [name] MRN: [id number] DOB/AGE: [birth year] 81 y.o.  Admit date: 10/16/2016 Discharge date: 10/19/2016  Admission Diagnoses: Bladder cancer  Discharge Diagnoses: Bladder cancer  Discharged Condition: good  Hospital Course:  Shane [name] is a 81 y.o. male who is s/p RAL cystoprostatectomy, bilateral pelvic lymph node dissection and ileal conduit urinary diversion creation with repair of umbilical hernia on 11/11/08 with Dr. Tresa Moore for [name].  The patient tolerated the procedure well, was extubated in the OR and taken to the recovery unit for routine postoperative care. They were then transferred to the stepdown unit. He was transferred to the floor on POD1. He was kept NPO on IVF until POD3 where he was transitioned to clear liquid diet. He was kept on Zosyn for 3 days postop. He resumed complete bowel function on POD 4. JP Cr same as serum POD 5 and therefore removed.   By [place] he had met the usual goals for discharge including ambulating at a preoperative capacity, having pain controlled with PO PRN medications, and tolerating a regular diet. He and his wife have become facile with urostomy care and will continue home health for this.   The patient will follow up with [facility] on 11/10/2016. Pathology was reviewed prior to discharge, [id number] with NEGATIVE margins.    Consults: PT, WOCN  Significant Diagnostic Studies: labs: as per above.   Treatments: surgery: as noted above  Discharge Exam: Blood pressure (!) 143/70, pulse 67, temperature 98.2 F (36.8 C), temperature source Oral, resp. rate 16, height '5\' 8"'$  (1.727 m), weight 92.1 kg (203 lb 0.7 oz), SpO2 95 %.   General:  well-developed and well-nourished male in NAD, lying in bed, alert & oriented, pleasant HEENT: [place]/AT, EOMI, sclera anicteric, hearing grossly intact, no nasal discharge, MMM Respiratory: nonlabored respirations, satting well on RA, symmetrical chest  rise Cardiovascular: pulse regular rate & rhythm Abdominal: soft, NTTP, nondistended, surgical incisions c/d/i without signs of exudate/erythema GU: RLQ Urostomy pink / patent with bander stents in situ and clear yellow urien. Prior JP site c/d/I. All incisions c/d/I, resolved umbilcal hernia Extremities: warm, well-perfused, no c/c/e Neuro: no focal deficits   Disposition: 01-Home or Self Care    Follow-up Information    Alexis Frock, MD On 11/10/2016.   Specialty:  Urology Why:  at 10:15 AM for MD visit Contact information: [place] [address] [phone]           Signed: Burnice Logan 10/19/2016, 9:38 AM

## 2016-10-17 NOTE — Interval H&P Note (Signed)
History and Physical Interval Note:  10/17/2016 7:17 AM  Shane [name]  has presented today for surgery, with the diagnosis of BLADDER CANCER, ABDOMINAL HERNIA  The various methods of treatment have been discussed with the patient and family. After consideration of risks, benefits and other options for treatment, the patient has consented to  Procedure(s): ROBOT ASSISTED LAPAROSCOPIC RADICAL CYSTOPROSTATECTOMY BILATERAL PELVIC LYMPHADENECTOMY, ILAEAL CONDUIT AND ABDOMINAL HERNIA REPAIR (N/A) CYSTOSCOPY WITH INJECTION OF INDOCYANINE GREEN DYE (N/A) as a surgical intervention .  The patient's history has been reviewed, patient examined, no change in status, stable for surgery.  I have reviewed the patient's chart and labs.  Questions were answered to the patient's satisfaction.     [name]

## 2016-10-17 NOTE — Brief Op Note (Signed)
10/16/2016 - 10/17/2016  2:00 PM  PATIENT:  Shane [name]  81 y.o. male  PRE-OPERATIVE DIAGNOSIS:  BLADDER CANCER, ABDOMINAL HERNIA  POST-OPERATIVE DIAGNOSIS:  BLADDER CANCER, ABDOMINAL HERNIA  PROCEDURE:  Procedure(s): ROBOT ASSISTED LAPAROSCOPIC RADICAL CYSTOPROSTATECTOMY BILATERAL PELVIC LYMPHADENECTOMY, ILAEAL CONDUIT AND ABDOMINAL HERNIA REPAIR (N/A) CYSTOSCOPY WITH INJECTION OF INDOCYANINE GREEN DYE (N/A)  SURGEON:  Surgeon(s) and Role:    * Alexis Frock, MD - Primary  PHYSICIAN ASSISTANT:   ASSISTANTS: Cleotis Lema MD   ANESTHESIA:   local and general  EBL:  Total I/O In: 3000 [I.V.:3000] Out: 400 [Blood:400]  BLOOD ADMINISTERED:none  DRAINS: 1 - JP to bulb, 2 - Urostomy to gravity   LOCAL MEDICATIONS USED:  MARCAINE     SPECIMEN:  Source of Specimen:  1 - cystoprostatectomy, 2 - bilateral pelvic lymph nodes, 3- bilateral ureteral margins  DISPOSITION OF SPECIMEN:  PATHOLOGY  COUNTS:  YES  TOURNIQUET:  * No tourniquets in log *  DICTATION: .Other Dictation: Dictation Number [id number]  PLAN OF CARE: Admit to inpatient   PATIENT DISPOSITION:  PACU - hemodynamically stable.   Delay start of Pharmacological VTE agent (>24hrs) due to surgical blood loss or risk of bleeding: yes

## 2016-10-17 NOTE — Op Note (Signed)
[name], [name] NO.:  [id number]  MEDICAL RECORD NO.:  [id number]  LOCATION:  50                         FACILITY:  [facility]  PHYSICIAN:  Alexis Frock, MD     DATE OF BIRTH:  29-Aug-1935  DATE OF PROCEDURE: 10/17/2016                              OPERATIVE REPORT   PREOPERATIVE DIAGNOSES:  Muscle invasive bladder cancer.  PROCEDURE: 1. Cystoscopy with injection of indocyanine green dye. 2. Robotic-assisted laparoscopic radical cystectomy. 3. Robotic-assisted laparoscopic radical prostatectomy. 4. Bilateral pelvic lymphadenectomy. 5. Ileal conduit urinary diversion. 6. Open umbilical hernia repair.  ESTIMATED BLOOD LOSS:  250 mL.  ASSISTANT:  Cleotis Lema, M.D.  DRAINS: 1. Jackson-Pratt drain to bulb suction. 2. Urostomy to gravity drainage with Bander stents in situ.  Right is     red.  Left is blue.  FINDINGS: 1. Old resection site, bladder tumor site noted, left bladder neck     area as anticipated. 2. Sentinel lymph node noted in the left common, left external, left     obturator lymph node groups, as well as right common and right     internal iliac lymph node groups. 3. Fat containing umbilical hernia.  INDICATION:  Shane [name] is a pleasant 81 year old gentleman with recent history of muscle invasive bladder cancer, clinically localized.  He underwent neoadjuvant chemotherapy.  Restaging has shown no evidence of locally advanced or distant disease.  Options were further discussed, and he wished to proceed with maximum aggressive therapy with curative intent with cystoprostatectomy.  He also has a large umbilical hernia, just fat containing clinically and by preoperative imaging.  This was clearly warranted concomitantly given it is in the area of intended surgery.  DESCRIPTION OF PROCEDURE:  The patient being, Shane [name], was verified. Procedure being cystoprostatectomy with injection of indocyanine green dye, and umbilical hernia repair  was confirmed.  Procedure was carried out.  Time-out was performed.  Intravenous antibiotics were administered.  General endotracheal anesthesia was introduced.  The patient was placed into a low lithotomy position.  Sterile field was created by prepping and draping the patient's penis, perineum, proximal thighs using iodine, and his infra-xiphoid abdomen using chlorhexidine gluconate.  After clipper shaving.  He was further fashioned to the operating table using 3-inch tape over foam padding across the supraxiphoid chest, taking exquisite care not to injure the area with port site which was cannulated.  Arms were tucked to the side.  Foam padding was placed along the area of the common peroneal nerves bilaterally and LAVH type drape was applied.  Initial attention was directed at cystoscopy with dye injection.  Cystourethroscopy was performed using a 24-French rigid injection scope set.  The patient's anterior and posterior urethra was unremarkable.  Inspection of urinary bladder revealed old resection site without visibly recurrent tumor in the left lateral wall bladder neck area as anticipated.  Approximately 2 mL of indocyanine green dye was used in submucosal blebs circumferentially around this area of prior tumor for tumor tattooing and for sentinel lymphangiography.  The injection scope was then exchanged for a 16-French Foley catheter per urethra to straight drain. Next, high-flow, low-pressure pneumoperitoneum was obtained using Veress technique in the  supraumbilical midline, approximately 3 fingerbreadths above the area of superior fascial defect as umbilical hernia. Laparoscopic examination of the peritoneal cavity revealed no significant adhesions, but there was omental containing umbilical hernia as anticipated, again was not containing bowel.  Additional ports were placed as follows; right paramedian 15 mm assistant port at the previously marked stomal marking; just lateral  inferior to this, 4 fingerbreadths away, an 8 mm robotic port and also a far lateral right- sided 12 mm AirSeal assistant port; left paramedian 8 mm robotic port; left far lateral 8 mm robotic port.  Robot was docked and passed through the electronic checks.  Attention was directed at reduction of the umbilical hernia.  The hernia again was containing fat only of the omentum, it was reduced.  Hernia sac was resected such that it no longer lied in the field of vision.  Pelvis was unremarkable.  There were no obvious large abdominal adhesions or bowel adhesions.  Attention was directed at the left ureteral identification.  Incision was made lateral to the descending colon from the area of the iliac crossing distally just lateral to the left medial umbilical ligament towards the area of the internal ring and then superiorly for distance approximately 10 cm above the iliac crossing, and the descending colon was carefully swept medially.  The left ureter was encountered as it coursed over the iliac vessels.  It was mobilized, marked with vessel loop, dissected distally to the ureterovesical junction, was doubly clipped and ligated with the proximal end being marked with a dyed tag suture.  A frozen section was sent negative for carcinoma.  It was then dissected proximally for distance approximately 6 cm above the iliac crossing and set aside in the left hemi-abdomen away from the pelvic dissection.  Attention was directed at left-sided pelvic lymphadenectomy.  The pelvis was inspected under near-infrared fluorescence light and there were multiple lymphatic channels seen coursing from the area of left-sided tumor, towards the area of left pelvic lymph node fields.  There were sentinel nodes noted within the left common, left obturator, and left external iliac groups respectively.  Next, the left common iliac group was dissected with the confines being left common iliac artery and vein, aortic  bifurcation, and iliac bifurcation.  Lymphostasis was achieved with cold clips, set aside, labeled left common iliac lymph node, sentinel.  Next, fibrofatty tissue in the confines of the left external iliac artery, vein, pelvic sidewall, and iliac bifurcation was mobilized.  Lymphostasis achieved with cold clips, set aside, labeled as left external iliac lymph node, sentinel.  Next, all fibrofatty tissue in the confines of the left external iliac vein, pelvic sidewall, and obturator nerve were mobilized.  Lymphostasis was achieved with cold clips, set aside, labeled left obturator lymph node, sentinel.  Left obturator nerve was inspected following these maneuvers and found to be visibly intact. Next, the posterior peritoneal incision was made on the right side just lateral to the descending colon, coursing over the area of the common iliac on the right and just lateral to the right medial umbilical ligament towards the right internal ring.  This was carefully swept medially.  The right ureter was identified and dissected distally to the ureterovesical junction where it was doubly clipped and ligated with a white tag suture and then proximally for a distance approximately 3 cm above the iliac crossing.  This set aside in the right hemi-abdomen away from this area of pelvic dissection.  The right pelvic lymph node fields were then inspected  under near-infrared fluorescence light, and this revealed a dominant group of sentinel lymph nodes in the left common and left internal group respectively as such a template lymphadenectomy was performed using the exact same confines as on the left; the left common, left external, and left obturator groups respectively.  The left common group and left internal group did contain the fluorescent lymph nodes. These were set aside and labeled as sentinel.  Next, a posterior peritoneal incision was made between the 2 prior lateral incisions and the posterior  peritoneal flap was developed behind the area of the bladder, and this space was developed towards the area of the prostatic apex.  This exposed the vascular pedicles of the bladder and prostate on each side.  The lateral prostatic fascia was swept away from the prostate in a base-to-apex orientation and vascular pedicle control was performed using white load stapler 60 mm on each side for the prostate pedicle, taking exquisite care to avoid rectal injury which did not occur.  Anterior dissection was performed, dropping the bladder away from the anterior abdominal wall towards the area of the dorsal venous complex, which was controlled using a green load stapler, which resulted in excellent hemostatic control of the structure.  Final apical dissection was performed in the anterior plane.  The membranous urethra was coldly transected half of its circumference.  The in-situ Foley catheter was delivered in the operative field, doubly clipped, and used as a bucket-handle with the posterior aspect of the membranous urethra, which was then transected.  This completely freed up the cystoprostatectomy specimen, which was placed into an extra-large EndoCatch bag for later retrieval.  Digital rectal exam was then performed using indicator glove under laparoscopic vision and no evidence of rectal violation was noted.  Next, a retroperitoneal window was created just anterior to the aortic bifurcation behind the descending colon from the right side to the left side and the left ureter was passed in the retroperitoneal orientation toward the area of the right pelvis so that it lied in area next to the right ureter.  The area of the ileocecal junction was visualized, and distal ileum was mobilized.  A suitable loop approximately 20 cm proximal to the ileocecal junction was found to be suitably mobile through its anterior abdominal wall.  This was marked with a mesenteric tagged silk suture and the  mesenteric clip distal to this.  A closed suction drain was brought through the previous lateral most robotic port site in the area of the deep pelvis.  The specimen string was brought through the previous left robotic port site and the right ureteral tag and bowel tag sutures were grasped with a self-locking laparoscopic grasper through the stomal port site.  Robot was then undocked.  Extraction was performed by extending the previous camera port site inferiorly erring on the left side of the umbilicus.  This inherently went through purposely into his umbilical hernia defect which was estimated to be approximately 3 cm in length.  The cystoprostatectomy specimen was removed, set aside for permanent pathology.  An Alexis wound protector retractor was deployed, and the distal ureters and tagged marked area of the ileum were brought through this in the operative field.  Attention was then directed to the ileal conduit urinary diversion.  A segment of distal ileum 15 cm in length ending in the previously tagged site was taken out of the continuity using green load stapler proximally and distally.  The mesentery developed with 2 white loads distally, 1 white  load proximally, taking exquisite care to avoid devascularization of the conduit or anastomotic segments.  Conduit was then laid in the retroperitoneal orientation, and bowel-bowel anastomosis was performed in side-to-side fashion using 2 loads of the green load stapler, resulted in excellent palpable patency.  The free end was oversewn at the level of the bowel mucosa with a running silk and a separate and imbricating layer of running silk, followed by interrupted silk at the area of the acute angle, and interrupted silk x4 at the mesentery defect to prevent internal hernia.  The anastomotic segments redelivered into the abdomen.  The proximal staple line of the conduit was excluded using running Vicryl.  The ureteroileal anastomosis was  performed first with the right ureter by first incising a segment approximately 4 mm of serosa and mucosa of the proximal conduit and 4 everting sutures were applied to allow circumferential identification of the mucosa.  The right distal ureter was finally transected with a final margin sent for pathology.  The ureter was of quite small caliber, this was spatulated for distance of a centimeter.  A heel stitch was applied of 4-0 Vicryl and a red Bander stent was placed at 26 cm to the anastomosis which was then further developed in a mucosa-to-mucosa fashion using two separate running suture lines and a heal-to-toe orientation of running 4-0 Vicryl, resulted in excellent mucosal apposition and no tension on the anastomosis.  On the contralateral side, the left ureter was anastomosed in a similar fashion as per the right, again employing mucosal everting sutures and a blue-colored Bander stent 26 cm to anastomosis.  The previously marked stomal site was then developed by excising a quarter size diameter column of skin and subcutaneous fat to the level of the fascia which was then dilated to accommodate 2 surgeon's fingers, and the distal end of the conduit was brought through this.  This was inspected via the extraction site and no evidence of strangulation was noted of the ureters, conduit segment, or anastomotic segments.  Omentum was brought over the area of extraction fascia.  The fascia was identified circumferentially until the edges and reapproximated, thus fixing the umbilical hernia with figure-of-eight Ethibond x6.  Then, the subcutaneous tissue of the extraction site was closed using running Vicryl and closed the skin using subcuticular Monocryl.  Stoma was then matured by placing 4 fascial anchor sutures in a quadrant fashion. Interrupted Vicryl and then 4 mucosal everting sutures in interrupted fashion in four quadrant resulted in excellent rose budding of the stoma.   Intervening 3-0 Vicryl was placed between the quadrant sutures, and the stoma appeared to be suitably patent.  Ostomy appliance was applied.  Drain stitch was applied.  Procedure was terminated.  The patient tolerated the procedure well.  No immediate periprocedural complications.  The patient was taken to the postanesthesia care unit in stable condition.          ______________________________ Alexis Frock, MD     TM/MEDQ  D:  10/17/2016  T:  10/17/2016  Job:  [id number]

## 2016-10-17 NOTE — Anesthesia Procedure Notes (Signed)
Procedure Name: Intubation Performed by: Gean Maidens Pre-anesthesia Checklist: Patient identified, Emergency Drugs available, Suction available, Patient being monitored and Timeout performed Patient Re-evaluated:Patient Re-evaluated prior to inductionOxygen Delivery Method: Circle system utilized Preoxygenation: Pre-oxygenation with 100% oxygen Intubation Type: IV induction Ventilation: Mask ventilation without difficulty Laryngoscope Size: Mac and 4 Grade View: Grade I Tube type: Oral Tube size: 7.5 mm Number of attempts: 1 Airway Equipment and Method: Stylet Placement Confirmation: ETT inserted through vocal cords under direct vision,  positive ETCO2 and breath sounds checked- equal and bilateral Secured at: 23 cm Tube secured with: Tape Dental Injury: Teeth and Oropharynx as per pre-operative assessment

## 2016-10-17 NOTE — Transfer of Care (Signed)
Immediate Anesthesia Transfer of Care Note  Patient: Shane [name]  Procedure(s) Performed: Procedure(s): ROBOT ASSISTED LAPAROSCOPIC RADICAL CYSTOPROSTATECTOMY BILATERAL PELVIC LYMPHADENECTOMY, ILAEAL CONDUIT AND ABDOMINAL HERNIA REPAIR (N/A) CYSTOSCOPY WITH INJECTION OF INDOCYANINE GREEN DYE (N/A)  Patient Location: PACU  Anesthesia Type:General  Level of Consciousness: sedated, patient cooperative and responds to stimulation  Airway & Oxygen Therapy: Patient Spontanous Breathing and Patient connected to face mask oxygen  Post-op Assessment: Report given to RN and Post -op Vital signs reviewed and stable  Post vital signs: Reviewed and stable  Last Vitals:  Vitals:   10/16/16 2250 10/17/16 0437  BP: (!) 132/91 130/72  Pulse: 62 65  Resp: 18 18  Temp: 36.6 C 36.9 C    Last Pain:  Vitals:   10/17/16 0437  TempSrc: Oral      Patients Stated Pain Goal: 3 (01/74/94 4967)  Complications: No apparent anesthesia complications

## 2016-10-17 NOTE — Anesthesia Postprocedure Evaluation (Addendum)
Anesthesia Post Note  Patient: Shane [name]  Procedure(s) Performed: Procedure(s) (LRB): ROBOT ASSISTED LAPAROSCOPIC RADICAL CYSTOPROSTATECTOMY BILATERAL PELVIC LYMPHADENECTOMY, ILAEAL CONDUIT AND ABDOMINAL HERNIA REPAIR (N/A) CYSTOSCOPY WITH INJECTION OF INDOCYANINE GREEN DYE (N/A)  Patient location during evaluation: PACU Anesthesia Type: General Level of consciousness: awake and alert Pain management: pain level controlled Vital Signs Assessment: post-procedure vital signs reviewed and stable Respiratory status: spontaneous breathing, nonlabored ventilation, respiratory function stable and patient connected to nasal cannula oxygen Cardiovascular status: blood pressure returned to baseline and stable Postop Assessment: no signs of nausea or vomiting Anesthetic complications: no       Last Vitals:  Vitals:   10/17/16 1515 10/17/16 1530  BP: (!) 141/79 (!) 148/82  Pulse: 78 76  Resp: 15 16  Temp:  36.4 C    Last Pain:  Vitals:   10/17/16 1528  TempSrc:   PainSc: Asleep                 Effie Berkshire

## 2016-10-18 LAB — GLUCOSE, CAPILLARY
Glucose-Capillary: 113 mg/dL — ABNORMAL HIGH (ref 65–99)
Glucose-Capillary: 117 mg/dL — ABNORMAL HIGH (ref 65–99)
Glucose-Capillary: 117 mg/dL — ABNORMAL HIGH (ref 65–99)
Glucose-Capillary: 114 mg/dL — ABNORMAL HIGH (ref 65–99)

## 2016-10-18 LAB — CBC
HCT: 35.6 % — ABNORMAL LOW (ref 39.0–52.0)
Hemoglobin: 12.1 g/dL — ABNORMAL LOW (ref 13.0–17.0)
MCH: 31.1 pg (ref 26.0–34.0)
MCHC: 34 g/dL (ref 30.0–36.0)
MCV: 91.5 fL (ref 78.0–100.0)
Platelets: 139 10*3/uL — ABNORMAL LOW (ref 150–400)
RBC: 3.89 MIL/uL — ABNORMAL LOW (ref 4.22–5.81)
RDW: 12.3 % (ref 11.5–15.5)
WBC: 8.5 10*3/uL (ref 4.0–10.5)

## 2016-10-18 LAB — BASIC METABOLIC PANEL
Anion gap: 7 (ref 5–15)
BUN: 17 mg/dL (ref 6–20)
CO2: 24 mmol/L (ref 22–32)
Calcium: 8.2 mg/dL — ABNORMAL LOW (ref 8.9–10.3)
Chloride: 107 mmol/L (ref 101–111)
Creatinine, Ser: 1.14 mg/dL (ref 0.61–1.24)
GFR calc Af Amer: >60 mL/min (ref >60)
GFR calc non Af Amer: 59 mL/min — ABNORMAL LOW (ref >60)
Glucose, Bld: 126 mg/dL — ABNORMAL HIGH (ref 65–99)
Potassium: 4 mmol/L (ref 3.5–5.1)
Sodium: 138 mmol/L (ref 135–145)

## 2016-10-18 MED ORDER — SODIUM CHLORIDE 0.45 % IV SOLN: INTRAVENOUS | Status: DC

## 2016-10-18 MED ORDER — ACETAMINOPHEN 10 MG/ML IV SOLN
1000.0000 mg | Freq: Four times a day (QID) | INTRAVENOUS | Status: AC
  Administered 2016-10-18 – 2016-10-19 (×4): 1000 mg via INTRAVENOUS
  Filled 2016-10-18 (×4): qty 100

## 2016-10-18 MED ORDER — POTASSIUM CHLORIDE IN NACL 20-0.45 MEQ/L-% IV SOLN
INTRAVENOUS | Status: DC
  Administered 2016-10-18 – 2016-10-22 (×8): via INTRAVENOUS
  Filled 2016-10-18 (×9): qty 1000

## 2016-10-18 NOTE — Consult Note (Cosign Needed)
[facility] Nurse ostomy consult note Stoma type/location: RUQ ileal conduit with two stents intact. Red = right, blue = left Stomal assessment/size: 1 and 1/2 inches round, raised, moist with os at center Peristomal assessment: intact, clear, small gulley at 9 o'clock Treatment options for stomal/peristomal skin: skin barrier ring Output: blood tinged urine Ostomy pouching: 1pc.convex pouch with skin barrier ring Education provided: Patient and family (wife, daughter, son-in law) present for pouch change. They observe pouch removal, stoma sizing, tracing and cutting of pouch, skin cleansing, skin barrier ring application and pouch application.  Attachment and disconnection of pouch to bedside drainage bag discussed including cleaning of bedside drainage bag and use of a leg strap.  All express appreciation for lesson today.  Will follow. Enrolled patient in [facility] program: No WOC nursing team will follow, and will remain available to this patient, the nursing and medical teams. Thanks, Maudie Flakes, MSN, RN, [place], Arther Abbott  Pager# [phone]

## 2016-10-18 NOTE — Progress Notes (Signed)
UROLOGY PROGRESS NOTES  Assessment/Plan: Shane [name] is a 81 y.o. male with a history of bilateral renal cysts, CAD, calcified pancreatitis, DM, Bell's palsy, HLD, HTN, MIBC s/p NAC now s/p RAL radical cystoprostatectomy, ileal conduit urinary diversion, bilateral pelvic lymph node dissection on 10/17/16 with Dr. Tresa Moore.   Interval/Plan: AFVSS. 900cc UOP, 800 overnight. 515cc JP output. Cr 1.14. Hb stable at 12.1. Stable.   - NPO except ice chips - mIVF 125/hr - PT consult - Transfer to floor - Zosyn x 3d - Wean oxygen   Subjective: Doing well. Pain controlled. No n/v. No flatus. Not OOB yet.  Objective:  Vital signs in last 24 hours: Temp:  [97.4 F (36.3 C)-99 F (37.2 C)] 98.6 F (37 C) (04/07 1122) Pulse Rate:  [66-97] 66 (04/07 1122) Resp:  [10-20] 14 (04/07 1202) BP: (113-172)/(55-95) 140/63 (04/07 1122) SpO2:  [96 %-100 %] 96 % (04/07 1202) Arterial Line BP: (144-162)/(64-72) 162/72 (04/06 1515)  04/06 0701 - 04/07 0700 In: 5075 [P.O.:50; I.V.:4725; IV Piggyback:300] Out: 1865 [Urine:900; Drains:515; Blood:450]    Physical Exam:  General:  well-developed and well-nourished male in NAD, lying in bed, alert & oriented, pleasant HEENT: [place]/AT, EOMI, sclera anicteric, hearing grossly intact, no nasal discharge, MMM Respiratory: nonlabored respirations, satting well on [place], symmetrical chest rise Cardiovascular: pulse regular rate & rhythm Abdominal: soft, appropriately TTP, nondistended, surgical incisions c/d/i without signs of exudate/erythema GU: right urostomy draining red tinged urine with bilateral bander stents visible, pink/healthy stoma, JP drain with SS drainage Extremities: warm, well-perfused, no c/c/e Neuro: no focal deficits   Data Review: Results for orders placed or performed during the hospital encounter of 10/16/16 (from the past 24 hour(s))  Glucose, capillary     Status: Abnormal   Collection Time: 10/17/16  2:31 PM  Result Value Ref  Range   Glucose-Capillary 140 (H) 65 - 99 mg/dL   Comment 1 Notify RN    Comment 2 Document in Chart   Hemoglobin and hematocrit, blood     Status: Abnormal   Collection Time: 10/17/16  2:57 PM  Result Value Ref Range   Hemoglobin 12.4 (L) 13.0 - 17.0 g/dL   HCT 36.6 (L) 39.0 - 52.0 %  Glucose, capillary     Status: Abnormal   Collection Time: 10/17/16  5:04 PM  Result Value Ref Range   Glucose-Capillary 187 (H) 65 - 99 mg/dL  Glucose, capillary     Status: Abnormal   Collection Time: 10/17/16  9:31 PM  Result Value Ref Range   Glucose-Capillary 132 (H) 65 - 99 mg/dL   Comment 1 Notify RN    Comment 2 Document in Chart   Basic metabolic panel     Status: Abnormal   Collection Time: 10/18/16  5:26 AM  Result Value Ref Range   Sodium 138 135 - 145 mmol/L   Potassium 4.0 3.5 - 5.1 mmol/L   Chloride 107 101 - 111 mmol/L   CO2 24 22 - 32 mmol/L   Glucose, Bld 126 (H) 65 - 99 mg/dL   BUN 17 6 - 20 mg/dL   Creatinine, Ser 1.14 0.61 - 1.24 mg/dL   Calcium 8.2 (L) 8.9 - 10.3 mg/dL   GFR calc non Af Amer 59 (L) >60 mL/min   GFR calc Af Amer >60 >60 mL/min   Anion gap 7 5 - 15  CBC     Status: Abnormal   Collection Time: 10/18/16  5:26 AM  Result Value Ref Range   WBC  8.5 4.0 - 10.5 K/uL   RBC 3.89 (L) 4.22 - 5.81 MIL/uL   Hemoglobin 12.1 (L) 13.0 - 17.0 g/dL   HCT 35.6 (L) 39.0 - 52.0 %   MCV 91.5 78.0 - 100.0 fL   MCH 31.1 26.0 - 34.0 pg   MCHC 34.0 30.0 - 36.0 g/dL   RDW 12.3 11.5 - 15.5 %   Platelets 139 (L) 150 - 400 K/uL  Glucose, capillary     Status: Abnormal   Collection Time: 10/18/16  8:03 AM  Result Value Ref Range   Glucose-Capillary 113 (H) 65 - 99 mg/dL   Comment 1 Notify RN    Comment 2 Document in Chart   Glucose, capillary     Status: Abnormal   Collection Time: 10/18/16 11:37 AM  Result Value Ref Range   Glucose-Capillary 117 (H) 65 - 99 mg/dL    Imaging: None

## 2016-10-18 NOTE — Evaluation (Signed)
Physical Therapy Evaluation Patient Details Name: Shane [name] MRN: [id number] DOB: [birth year] Today's Date: 10/18/2016   History of Present Illness  81 y.o. male with a history of bilateral renal cysts, CAD, calcified pancreatitis, DM, Bell's palsy, HLD, HTN, MIBC s/p NAC now s/p RAL radical cystoprostatectomy, ileal conduit urinary diversion, bilateral pelvic lymph node dissection on 10/17/16   Clinical Impression  Pt admitted with above diagnosis. Pt currently with functional limitations due to the deficits listed below (see PT Problem List).  Pt will benefit from skilled PT to increase their independence and safety with mobility to allow discharge to the venue listed below.  Pt assisted OOB and ambulated in hallway.  Will likely progress to home with no needs.       Follow Up Recommendations No PT follow up (likely progress to no needs)    Equipment Recommendations  Rolling walker with 5" wheels (will continue assess for need)    Recommendations for Other Services       Precautions / Restrictions Precautions Precautions: Fall Precaution Comments: L JP drain      Mobility  Bed Mobility Overal bed mobility: Needs Assistance Bed Mobility: Supine to Sit;Sit to Supine     Supine to sit: Min assist Sit to supine: Min assist   General bed mobility comments: verbal cues for technique, assist for trunk upright, assist for LEs onto bed  Transfers Overall transfer level: Needs assistance Equipment used: Rolling walker (2 wheeled) Transfers: Sit to/from Stand Sit to Stand: From elevated surface;Min assist         General transfer comment: verbal cues for hand placement, assist to rise and steady, required elevated surface (just bought lift chair for home as well)  Ambulation/Gait Ambulation/Gait assistance: Min guard Ambulation Distance (Feet): 120 Feet Assistive device: Rolling walker (2 wheeled) Gait Pattern/deviations: Step-through pattern;Decreased stride length      General Gait Details: verbal cues for safe use of RW, distance to tolerance  Stairs            Wheelchair Mobility    Modified Rankin (Stroke Patients Only)       Balance                                             Pertinent Vitals/Pain Pain Assessment: 0-10 Pain Score: 6  Pain Location: dull stomach pain over surgical site Pain Descriptors / Indicators: Dull Pain Intervention(s): Limited activity within patient's tolerance;Monitored during session;Repositioned;PCA encouraged    Home Living Family/patient expects to be discharged to:: Private residence Living Arrangements: Children;Spouse/significant other   Type of Home: House Home Access: Stairs to enter Entrance Stairs-Rails: Right Entrance Stairs-Number of Steps: 6-7 Home Layout: Able to live on main level with bedroom/bathroom Home Equipment: None      Prior Function Level of Independence: Independent               Hand Dominance        Extremity/Trunk Assessment        Lower Extremity Assessment Lower Extremity Assessment: Generalized weakness       Communication   Communication: No difficulties  Cognition Arousal/Alertness: Awake/alert Behavior During Therapy: WFL for tasks assessed/performed Overall Cognitive Status: Within Functional Limits for tasks assessed  General Comments      Exercises     Assessment/Plan    PT Assessment Patient needs continued PT services  PT Problem List Decreased strength;Decreased mobility;Decreased activity tolerance;Decreased knowledge of use of DME;Pain       PT Treatment Interventions DME instruction;Gait training;Therapeutic exercise;Therapeutic activities;Patient/family education;Stair training    PT Goals (Current goals can be found in the Care Plan section)  Acute Rehab PT Goals PT Goal Formulation: With patient Time For Goal Achievement: 10/25/16 Potential  to Achieve Goals: Good    Frequency Min 3X/week   Barriers to discharge        Co-evaluation               End of Session Equipment Utilized During Treatment: Gait belt Activity Tolerance: Patient tolerated treatment well Patient left: in bed;with call bell/phone within reach;with bed alarm set Nurse Communication: Mobility status PT Visit Diagnosis: Difficulty in walking, not elsewhere classified (R26.2)    Time: 1610-9604 PT Time Calculation (min) (ACUTE ONLY): 21 min   Charges:   PT Evaluation $PT Eval Low Complexity: 1 Procedure     PT G CodesCarmelia Bake, PT, DPT 10/18/2016 Pager: [phone]   York Ram E 10/18/2016, 3:07 PM

## 2016-10-19 LAB — GLUCOSE, CAPILLARY
Glucose-Capillary: 103 mg/dL — ABNORMAL HIGH (ref 65–99)
Glucose-Capillary: 116 mg/dL — ABNORMAL HIGH (ref 65–99)
Glucose-Capillary: 104 mg/dL — ABNORMAL HIGH (ref 65–99)
Glucose-Capillary: 105 mg/dL — ABNORMAL HIGH (ref 65–99)

## 2016-10-19 LAB — CBC
HCT: 32.1 % — ABNORMAL LOW (ref 39.0–52.0)
Hemoglobin: 11 g/dL — ABNORMAL LOW (ref 13.0–17.0)
MCH: 30.4 pg (ref 26.0–34.0)
MCHC: 34.3 g/dL (ref 30.0–36.0)
MCV: 88.7 fL (ref 78.0–100.0)
Platelets: 120 10*3/uL — ABNORMAL LOW (ref 150–400)
RBC: 3.62 MIL/uL — ABNORMAL LOW (ref 4.22–5.81)
RDW: 12 % (ref 11.5–15.5)
WBC: 8.3 10*3/uL (ref 4.0–10.5)

## 2016-10-19 LAB — BASIC METABOLIC PANEL
Anion gap: 5 (ref 5–15)
BUN: 12 mg/dL (ref 6–20)
CO2: 24 mmol/L (ref 22–32)
Calcium: 8.2 mg/dL — ABNORMAL LOW (ref 8.9–10.3)
Chloride: 106 mmol/L (ref 101–111)
Creatinine, Ser: 0.94 mg/dL (ref 0.61–1.24)
GFR calc Af Amer: >60 mL/min (ref >60)
GFR calc non Af Amer: >60 mL/min (ref >60)
Glucose, Bld: 114 mg/dL — ABNORMAL HIGH (ref 65–99)
Potassium: 3.9 mmol/L (ref 3.5–5.1)
Sodium: 135 mmol/L (ref 135–145)

## 2016-10-19 MED ORDER — ACETAMINOPHEN 10 MG/ML IV SOLN
1000.0000 mg | Freq: Four times a day (QID) | INTRAVENOUS | Status: AC
  Administered 2016-10-19 (×2): 1000 mg via INTRAVENOUS
  Filled 2016-10-19 (×4): qty 100

## 2016-10-19 NOTE — Progress Notes (Signed)
qPhysical Therapy Treatment Patient Details Name: Shane [name] MRN: [id number] DOB: 09-16-1935 Today's Date: 10/19/2016    History of Present Illness 81 y.o. male with a history of bilateral renal cysts, CAD, calcified pancreatitis, DM, Bell's palsy, HLD, HTN, MIBC s/p NAC now s/p RAL radical cystoprostatectomy, ileal conduit urinary diversion, bilateral pelvic lymph node dissection on 10/17/16     PT Comments    Pt ambulated around unit today and tolerated distance well.  Pt encouraged to ambulate 2-3 more times today with nursing staff.  Follow Up Recommendations  No PT follow up     Equipment Recommendations  Rolling walker with 5" wheels    Recommendations for Other Services       Precautions / Restrictions Precautions Precautions: Fall Precaution Comments: L JP drain    Mobility  Bed Mobility               General bed mobility comments: pt up in recliner on arrival  Transfers Overall transfer level: Needs assistance Equipment used: Rolling walker (2 wheeled) Transfers: Sit to/from Stand Sit to Stand: Min guard         General transfer comment: verbal cues for hand placement, improved rise and steady today  Ambulation/Gait Ambulation/Gait assistance: Min guard Ambulation Distance (Feet): 400 Feet Assistive device: Rolling walker (2 wheeled) Gait Pattern/deviations: Step-through pattern;Decreased stride length     General Gait Details: verbal cues for safe use of RW, tolerated distance well   Stairs            Wheelchair Mobility    Modified Rankin (Stroke Patients Only)       Balance                                            Cognition Arousal/Alertness: Awake/alert Behavior During Therapy: WFL for tasks assessed/performed Overall Cognitive Status: Within Functional Limits for tasks assessed                                        Exercises      General Comments        Pertinent  Vitals/Pain Pain Assessment: 0-10 Pain Score: 7  Pain Location: dull stomach pain over surgical site Pain Descriptors / Indicators: Dull;Pressure Pain Intervention(s): Limited activity within patient's tolerance;Monitored during session;Repositioned;PCA encouraged    Home Living                      Prior Function            PT Goals (current goals can now be found in the care plan section) Progress towards PT goals: Progressing toward goals    Frequency    Min 3X/week      PT Plan Current plan remains appropriate    Co-evaluation             End of Session Equipment Utilized During Treatment: Gait belt Activity Tolerance: Patient tolerated treatment well Patient left: with call bell/phone within reach;in chair   PT Visit Diagnosis: Difficulty in walking, not elsewhere classified (R26.2)     Time: 1050-1105 PT Time Calculation (min) (ACUTE ONLY): 15 min  Charges:  $Gait Training: 8-22 mins                    G  Codes:       Carmelia Bake, PT, DPT 10/19/2016 Pager: [phone]    York Ram E 10/19/2016, 12:36 PM

## 2016-10-19 NOTE — Progress Notes (Signed)
UROLOGY PROGRESS NOTES  Assessment/Plan: Shane [name] is a 81 y.o. male with a history of bilateral renal cysts, CAD, calcified pancreatitis, DM, Bell's palsy, HLD, HTN, MIBC s/p NAC now s/p RAL radical cystoprostatectomy, ileal conduit urinary diversion, bilateral pelvic lymph node dissection on 10/17/16 with Dr. Tresa Moore.   Interval/Plan: AFVSS. 1.5L UOP. 40cc JP output. Cr 0.9. Hb 11 from 12. WBC 8.   - Continue NPO except ice chips, mIVF 125/hr, Zosyn - Renew IV tylenol x 24hr - Consider clear liquid tomorrow - PT/WOCN consult - Ambulate   Subjective: Expected postop abdominal pain. Mild nausea, no emesis. Ambulated x 1 yesterday. No flatus/BM.  Objective:  Vital signs in last 24 hours: Temp:  [98.2 F (36.8 C)-100.2 F (37.9 C)] 98.2 F (36.8 C) (04/08 0525) Pulse Rate:  [62-68] 67 (04/08 0525) Resp:  [12-18] 16 (04/08 0800) BP: (126-143)/(56-77) 143/70 (04/08 0525) SpO2:  [94 %-100 %] 95 % (04/08 0800)  04/07 0701 - 04/08 0700 In: 3179.6 [I.V.:2564.6; IV Piggyback:550] Out: 5009 [Urine:1550; Drains:40]    Physical Exam:  General:  well-developed and well-nourished male in NAD, lying in bed, alert & oriented, pleasant HEENT: [place]/AT, EOMI, sclera anicteric, hearing grossly intact, no nasal discharge, MMM Respiratory: nonlabored respirations, satting well on RA, symmetrical chest rise Cardiovascular: pulse regular rate & rhythm Abdominal: soft, appropriately TTP, nondistended, surgical incisions c/d/i without signs of exudate/erythema GU: right urostomy draining yellow urine with bilateral bander stents visible, pink/healthy stoma, JP drain with SS drainage Extremities: warm, well-perfused, no c/c/e Neuro: no focal deficits   Data Review: Results for orders placed or performed during the hospital encounter of 10/16/16 (from the past 24 hour(s))  Glucose, capillary     Status: Abnormal   Collection Time: 10/18/16 11:37 AM  Result Value Ref Range   Glucose-Capillary 117 (H) 65 - 99 mg/dL  Glucose, capillary     Status: Abnormal   Collection Time: 10/18/16  4:36 PM  Result Value Ref Range   Glucose-Capillary 117 (H) 65 - 99 mg/dL  Glucose, capillary     Status: Abnormal   Collection Time: 10/18/16  9:25 PM  Result Value Ref Range   Glucose-Capillary 114 (H) 65 - 99 mg/dL  Basic metabolic panel     Status: Abnormal   Collection Time: 10/19/16  4:07 AM  Result Value Ref Range   Sodium 135 135 - 145 mmol/L   Potassium 3.9 3.5 - 5.1 mmol/L   Chloride 106 101 - 111 mmol/L   CO2 24 22 - 32 mmol/L   Glucose, Bld 114 (H) 65 - 99 mg/dL   BUN 12 6 - 20 mg/dL   Creatinine, Ser 0.94 0.61 - 1.24 mg/dL   Calcium 8.2 (L) 8.9 - 10.3 mg/dL   GFR calc non Af Amer >60 >60 mL/min   GFR calc Af Amer >60 >60 mL/min   Anion gap 5 5 - 15  CBC     Status: Abnormal   Collection Time: 10/19/16  4:07 AM  Result Value Ref Range   WBC 8.3 4.0 - 10.5 K/uL   RBC 3.62 (L) 4.22 - 5.81 MIL/uL   Hemoglobin 11.0 (L) 13.0 - 17.0 g/dL   HCT 32.1 (L) 39.0 - 52.0 %   MCV 88.7 78.0 - 100.0 fL   MCH 30.4 26.0 - 34.0 pg   MCHC 34.3 30.0 - 36.0 g/dL   RDW 12.0 11.5 - 15.5 %   Platelets 120 (L) 150 - 400 K/uL  Glucose, capillary  Status: Abnormal   Collection Time: 10/19/16  7:53 AM  Result Value Ref Range   Glucose-Capillary 103 (H) 65 - 99 mg/dL    Imaging: None

## 2016-10-20 ENCOUNTER — Ambulatory Visit: Payer: Medicare Other | Admitting: Family Medicine

## 2016-10-20 ENCOUNTER — Encounter (HOSPITAL_COMMUNITY): Payer: Self-pay | Admitting: Urology

## 2016-10-20 LAB — TYPE AND SCREEN
ABO/RH(D): A POS
Antibody Screen: NEGATIVE
Unit division: 0
Unit division: 0

## 2016-10-20 LAB — GLUCOSE, CAPILLARY
Glucose-Capillary: 112 mg/dL — ABNORMAL HIGH (ref 65–99)
Glucose-Capillary: 139 mg/dL — ABNORMAL HIGH (ref 65–99)
Glucose-Capillary: 114 mg/dL — ABNORMAL HIGH (ref 65–99)
Glucose-Capillary: 109 mg/dL — ABNORMAL HIGH (ref 65–99)

## 2016-10-20 LAB — CBC
HCT: 34.6 % — ABNORMAL LOW (ref 39.0–52.0)
Hemoglobin: 11.8 g/dL — ABNORMAL LOW (ref 13.0–17.0)
MCH: 30 pg (ref 26.0–34.0)
MCHC: 34.1 g/dL (ref 30.0–36.0)
MCV: 88 fL (ref 78.0–100.0)
Platelets: 156 10*3/uL (ref 150–400)
RBC: 3.93 MIL/uL — ABNORMAL LOW (ref 4.22–5.81)
RDW: 11.9 % (ref 11.5–15.5)
WBC: 8.3 10*3/uL (ref 4.0–10.5)

## 2016-10-20 LAB — BASIC METABOLIC PANEL
Anion gap: 9 (ref 5–15)
BUN: 13 mg/dL (ref 6–20)
CO2: 25 mmol/L (ref 22–32)
Calcium: 8.2 mg/dL — ABNORMAL LOW (ref 8.9–10.3)
Chloride: 101 mmol/L (ref 101–111)
Creatinine, Ser: 0.91 mg/dL (ref 0.61–1.24)
GFR calc Af Amer: >60 mL/min (ref >60)
GFR calc non Af Amer: >60 mL/min (ref >60)
Glucose, Bld: 125 mg/dL — ABNORMAL HIGH (ref 65–99)
Potassium: 4.1 mmol/L (ref 3.5–5.1)
Sodium: 135 mmol/L (ref 135–145)

## 2016-10-20 LAB — BPAM RBC
Blood Product Expiration Date: 201804242359
Blood Product Expiration Date: 201804252359
Unit Type and Rh: 6200
Unit Type and Rh: 6200

## 2016-10-20 LAB — CREATININE, FLUID (PLEURAL, PERITONEAL, JP DRAINAGE): Creat, Fluid: 1 mg/dL

## 2016-10-20 MED ORDER — DOCUSATE SODIUM 100 MG PO CAPS
100.0000 mg | ORAL_CAPSULE | Freq: Two times a day (BID) | ORAL | Status: DC
  Administered 2016-10-20 – 2016-10-23 (×7): 100 mg via ORAL
  Filled 2016-10-20 (×7): qty 1

## 2016-10-20 MED ORDER — SENNA 8.6 MG PO TABS
1.0000 | ORAL_TABLET | Freq: Every day | ORAL | Status: DC
  Administered 2016-10-20 – 2016-10-22 (×3): 8.6 mg via ORAL
  Filled 2016-10-20 (×3): qty 1

## 2016-10-20 MED ORDER — MORPHINE SULFATE (PF) 4 MG/ML IV SOLN: 2.0000 mg | INTRAVENOUS | Status: DC | PRN

## 2016-10-20 MED ORDER — ONDANSETRON HCL 4 MG/2ML IJ SOLN
4.0000 mg | INTRAMUSCULAR | Status: DC | PRN
  Administered 2016-10-20 – 2016-10-23 (×5): 4 mg via INTRAVENOUS
  Filled 2016-10-20 (×5): qty 2

## 2016-10-20 MED ORDER — ACETAMINOPHEN 10 MG/ML IV SOLN
1000.0000 mg | Freq: Four times a day (QID) | INTRAVENOUS | Status: AC
  Administered 2016-10-20 – 2016-10-21 (×4): 1000 mg via INTRAVENOUS
  Filled 2016-10-20 (×4): qty 100

## 2016-10-20 MED ORDER — OXYCODONE HCL 5 MG PO TABS: 5.0000 mg | ORAL_TABLET | ORAL | Status: DC | PRN

## 2016-10-20 NOTE — Progress Notes (Signed)
3 Days Post-Op  Subjective:  1 - Bladder Cancer - s/p robotic cystoprostatecotmy with cysto / ICG injection + sentinal / template pelvic lymphadenectomy + open umbilical hernia repair + ileal conduit urinary diversion on 10/18/16. Admitted day prior for bowel prep and stomal marking. Path penidng. Observed stepdown POD 0 and transferred to med-surg floor POD 1.   2 - Umbilical Hernia - s/p concomitant primary repair at time of cystectomy above.  3 - Post-op Ileus - s/p bowel anastamoses at time of sugery. NPO initially with ice chips. Clears started POD3.  4 - Disposition / Rehab - pt independatn in all ADL's at baseline. Ostomy RN and PT following in house with final recs pending.  Today "Shane [name]" is stable. Pain controlled. Out of bed most of day yesterday and ambulated x 3. NO flatus yet, but no nausea / emesis.  Objective: Vital signs in last 24 hours: Temp:  [97.7 F (36.5 C)-99.1 F (37.3 C)] 98.4 F (36.9 C) (04/09 0519) Pulse Rate:  [65-75] 72 (04/09 0519) Resp:  [14-16] 16 (04/09 0519) BP: (130-143)/(73-89) 143/89 (04/09 0519) SpO2:  [95 %-97 %] 97 % (04/09 0519) Last BM Date: 10/16/16  Intake/Output from previous day: 04/08 0701 - 04/09 0700 In: 3385 [P.O.:120; I.V.:2875; IV Piggyback:350] Out: 1610 [Urine:2250; Drains:240] Intake/Output this shift: No intake/output data recorded.  General appearance: alert, cooperative and appears stated age Eyes: negative Nose: Nares normal. Septum midline. Mucosa normal. No drainage or sinus tenderness. Throat: lips, mucosa, and tongue normal; teeth and gums normal Neck: supple, symmetrical, trachea midline Back: symmetric, no curvature. ROM normal. No CVA tenderness. Resp: non-labored on room air Cardio: Nl rate GI: soft, non-tender; bowel sounds normal; no masses,  no organomegaly Male genitalia: normal Extremities: extremities normal, atraumatic, no cyanosis or edema Pulses: 2+ and symmetric Skin: Skin color, texture, turgor  normal. No rashes or lesions Neurologic: Grossly normal RLQ Urostomy pink / patent with distal Bander stents in situ. JP with serous fluid that is non-foul.   Lab Results:   Recent Labs  10/19/16 0407 10/20/16 0349  WBC 8.3 8.3  HGB 11.0* 11.8*  HCT 32.1* 34.6*  PLT 120* 156   BMET  Recent Labs  10/19/16 0407 10/20/16 0349  NA 135 135  K 3.9 4.1  CL 106 101  CO2 24 25  GLUCOSE 114* 125*  BUN 12 13  CREATININE 0.94 0.91  CALCIUM 8.2* 8.2*   PT/INR No results for input(s): LABPROT, INR in the last 72 hours. ABG No results for input(s): PHART, HCO3 in the last 72 hours.  Invalid input(s): PCO2, PO2  Studies/Results: No results found.  Anti-infectives: Anti-infectives    Start     Dose/Rate Route Frequency Ordered Stop   10/17/16 1700  piperacillin-tazobactam (ZOSYN) IVPB 3.375 g     3.375 g 12.5 mL/hr over 240 Minutes Intravenous Every 8 hours 10/17/16 1609 10/20/16 1659   10/17/16 0800  neomycin (MYCIFRADIN) tablet 1,000 mg  Status:  Discontinued     1,000 mg Oral  Once 10/16/16 2114 10/18/16 0445   10/17/16 0800  metroNIDAZOLE (FLAGYL) tablet 500 mg  Status:  Discontinued     500 mg Oral  Once 10/16/16 2114 10/18/16 0445   10/17/16 0702  piperacillin-tazobactam (ZOSYN) 3.375 GM/50ML IVPB    Comments:  Danley Danker   : cabinet override      10/17/16 0702 10/17/16 0743   10/17/16 0600  piperacillin-tazobactam (ZOSYN) IVPB 3.375 g     3.375 g 100 mL/hr over 30 Minutes  Intravenous 30 min pre-op 10/16/16 1618 10/17/16 0743      Assessment/Plan:  1 - Bladder Cancer - doing well POD 3. Path pending.   2 - Umbilical Hernia - no recurrence on exam s/p primary repair.   3 - Post-op Ileus - advance to clears and IVF to 1/2 maintenance today.   4 - Disposition / Rehab - appreciate PT and ostomy RN help. Pt will need at least [facility] for home for new ostomy teaching / supplies.   [facility], Latriece Anstine 10/20/2016

## 2016-10-20 NOTE — Progress Notes (Addendum)
Chaplain responding to consult for Advanced Directive.  Chaplain shared paperwork with the patient.  Chaplain shared with the patient that this is a service we provide and can sometimes do it while the patient is an inpatient in the hospital.  Patient states he would like to make these decisions with his spouse who will be here visiting sometime this morning.  Chaplain shared with the patient that she can stop back by around noon and see if it's something they would like to get completed.  Chaplain will work with patient and other staff to see if it can be completed today.      10/20/16 0951  Clinical Encounter Type  Visited With Patient  Visit Type Initial;Social support  Referral From Physician  Spiritual Encounters  Spiritual Needs Literature   Berneice Heinrich  12:13  Chaplain circled back around to speak with patient's spouse regarding Advanced Directive.  Patient feels like he will be here all week.  Spouse states they will likely complete AD one day before discharge because the patient has several copies of paperwork at home that he have not completed. She would like him to do it this time while in house.  Chaplain shared with the spouse to have nurse place consult for Chaplain.

## 2016-10-20 NOTE — Consult Note (Signed)
[place] Nurse ostomy follow up Stoma type/location: RUQ urostomy with two stents intact.  Pouch applied on Saturday is still intact. Stomal assessment/size: 1 and 1/2 inch pouch applied yesterday is intact. Peristomal assessment: Not seen today Treatment options for stomal/peristomal skin: Skin barrier ring in place Output: clearing yellow urine. Ostomy pouching: 1pc. convex with skin barrier ring Education provided: Elongated session with wife and patient to review steps to pouch change done on Saturday.  Asking appropriate questions. Taught to connect and disconnect from night time urinary drainage bag.  Will practice that today.  Tomorrow at 1pm I will have another practice session with patient and wife regarding pouch changes. Enrolled patient in [facility] Start Discharge program: Yes [place] nursing team will follow, and will remain available to this patient, the nursing and medical teams.  Thanks, Maudie Flakes, MSN, RN, [place], Arther Abbott  Pager# [phone]

## 2016-10-20 NOTE — Progress Notes (Signed)
8mg  PCA morphine WIS with [facility].

## 2016-10-20 NOTE — Progress Notes (Signed)
qPhysical Therapy Treatment Patient Details Name: Shane [name] MRN: [id number] DOB: Feb 10, 1936 Today's Date: 10/20/2016    History of Present Illness 81 y.o. male with a history of bilateral renal cysts, CAD, calcified pancreatitis, DM, Bell's palsy, HLD, HTN, MIBC s/p NAC now s/p RAL radical cystoprostatectomy, ileal conduit urinary diversion, bilateral pelvic lymph node dissection on 10/17/16     PT Comments    Pt ambulated 400' holding IV pole with no loss of balance, no physical assist needed for mobility. PT goals met, encouraged pt to ambulate in halls at least TID. DC PT.    Follow Up Recommendations  No PT follow up     Equipment Recommendations  None recommended by PT    Recommendations for Other Services       Precautions / Restrictions Precautions Precautions: Fall Precaution Comments: L JP drain    Mobility  Bed Mobility               General bed mobility comments: pt up in recliner on arrival  Transfers Overall transfer level: Needs assistance Equipment used: None Transfers: Sit to/from Stand Sit to Stand: Modified independent (Device/Increase time)         General transfer comment: steady, used armrests  Ambulation/Gait Ambulation/Gait assistance: Modified independent (Device/Increase time) Ambulation Distance (Feet): 400 Feet Assistive device:  (IV pole) Gait Pattern/deviations: Step-through pattern   Gait velocity interpretation: at or above normal speed for age/gender General Gait Details: steady, no LOB, tolerated distance well, pushed IV pole with LUE   Stairs            Wheelchair Mobility    Modified Rankin (Stroke Patients Only)       Balance Overall balance assessment: Modified Independent                                          Cognition Arousal/Alertness: Awake/alert Behavior During Therapy: WFL for tasks assessed/performed Overall Cognitive Status: Within Functional Limits for tasks  assessed                                        Exercises      General Comments        Pertinent Vitals/Pain Pain Score: 5  Pain Location: dull stomach pain over surgical site Pain Descriptors / Indicators: Dull;Pressure Pain Intervention(s): Limited activity within patient's tolerance;Monitored during session;Premedicated before session    Home Living                      Prior Function            PT Goals (current goals can now be found in the care plan section) Progress towards PT goals: Goals met/education completed, patient discharged from PT    Frequency    Min 3X/week      PT Plan Current plan remains appropriate    Co-evaluation             End of Session Equipment Utilized During Treatment: Gait belt Activity Tolerance: Patient tolerated treatment well Patient left: with call bell/phone within reach;in chair;with family/visitor present Nurse Communication: Mobility status PT Visit Diagnosis: Difficulty in walking, not elsewhere classified (R26.2)     Time: 9114-6545 PT Time Calculation (min) (ACUTE ONLY): 12 min  Charges:  $Gait Training: 8-22 mins  G Codes:         Blondell Reveal Kistler 10/20/2016, 1:30 PM [phone]

## 2016-10-20 NOTE — Progress Notes (Signed)
UROLOGY PROGRESS NOTES  Assessment/Plan: Shane [name] is a 81 y.o. male with a history of bilateral renal cysts, CAD, calcified pancreatitis, DM, Bell's palsy, HLD, HTN, MIBC s/p NAC now s/p RAL radical cystoprostatectomy, ileal conduit urinary diversion, bilateral pelvic lymph node dissection on 10/17/16 with Dr. Tresa Moore.   Interval/Plan: AFVSS. 2.25 UOP. [place]. Cr 0.9. Hb 11.8.   - Clear liquid diet - Discontinue PCA - Start PO and IV PRNs - Renew Tylenol - JP Cr - Start stool softeners - mIVF to 50cc/hr - PT/WOCN consult - Ambulate   Subjective: No flatus/BM yet. Did have a bloating sensation overnight but subsided. Pain is currently controlled, no more than 3/10. Did well with ambulation x3 and being OOB. No n/v. No hiccups/belching. Feels 'gas rumbles' in belly.   Objective:  Vital signs in last 24 hours: Temp:  [97.7 F (36.5 C)-99.1 F (37.3 C)] 98.4 F (36.9 C) (04/09 0519) Pulse Rate:  [65-75] 72 (04/09 0519) Resp:  [14-16] 16 (04/09 0519) BP: (130-143)/(73-89) 143/89 (04/09 0519) SpO2:  [95 %-97 %] 97 % (04/09 0519)  04/08 0701 - 04/09 0700 In: 1937 [P.O.:120; I.V.:2875; IV Piggyback:350] Out: 2490 [Urine:2250; Drains:240]    Physical Exam:  General:  well-developed and well-nourished male in NAD, lying in bed, alert & oriented, pleasant HEENT: [place]/AT, EOMI, sclera anicteric, hearing grossly intact, no nasal discharge, MMM Respiratory: nonlabored respirations, satting well on RA, symmetrical chest rise Cardiovascular: pulse regular rate & rhythm Abdominal: soft, appropriately TTP, nondistended, surgical incisions c/d/i without signs of exudate/erythema with moderate surrounding ecchymosis GU: right urostomy draining yellow urine with bilateral bander stents visible, pink/healthy stoma, JP drain with SS drainage Extremities: warm, well-perfused, no c/c/e Neuro: no focal deficits   Data Review: Results for orders placed or performed during the hospital  encounter of 10/16/16 (from the past 24 hour(s))  Glucose, capillary     Status: Abnormal   Collection Time: 10/19/16 11:41 AM  Result Value Ref Range   Glucose-Capillary 116 (H) 65 - 99 mg/dL   Comment 1 Notify RN    Comment 2 Document in Chart   Glucose, capillary     Status: Abnormal   Collection Time: 10/19/16  4:43 PM  Result Value Ref Range   Glucose-Capillary 104 (H) 65 - 99 mg/dL   Comment 1 Notify RN    Comment 2 Document in Chart   Glucose, capillary     Status: Abnormal   Collection Time: 10/19/16  9:20 PM  Result Value Ref Range   Glucose-Capillary 105 (H) 65 - 99 mg/dL  Basic metabolic panel     Status: Abnormal   Collection Time: 10/20/16  3:49 AM  Result Value Ref Range   Sodium 135 135 - 145 mmol/L   Potassium 4.1 3.5 - 5.1 mmol/L   Chloride 101 101 - 111 mmol/L   CO2 25 22 - 32 mmol/L   Glucose, Bld 125 (H) 65 - 99 mg/dL   BUN 13 6 - 20 mg/dL   Creatinine, Ser 0.91 0.61 - 1.24 mg/dL   Calcium 8.2 (L) 8.9 - 10.3 mg/dL   GFR calc non Af Amer >60 >60 mL/min   GFR calc Af Amer >60 >60 mL/min   Anion gap 9 5 - 15  CBC     Status: Abnormal   Collection Time: 10/20/16  3:49 AM  Result Value Ref Range   WBC 8.3 4.0 - 10.5 K/uL   RBC 3.93 (L) 4.22 - 5.81 MIL/uL   Hemoglobin 11.8 (L)  13.0 - 17.0 g/dL   HCT 34.6 (L) 39.0 - 52.0 %   MCV 88.0 78.0 - 100.0 fL   MCH 30.0 26.0 - 34.0 pg   MCHC 34.1 30.0 - 36.0 g/dL   RDW 11.9 11.5 - 15.5 %   Platelets 156 150 - 400 K/uL  Glucose, capillary     Status: Abnormal   Collection Time: 10/20/16  7:33 AM  Result Value Ref Range   Glucose-Capillary 112 (H) 65 - 99 mg/dL    Imaging: None  I have seen and examined the patient and agree with Dr. Isabel Caprice assesment and plan.  Briefly,  S: No compliants, pain controlled.   O: NAd, AOx3, wife at bedside RRR Non-labored breathing RLQ urostomy pink / patent with bander stents in situ SCD's in place  hgb and Cr stable  A/P: Adv to clears, continue ambulation.

## 2016-10-20 NOTE — Progress Notes (Addendum)
PCA pump d/c'd per order. 8 mg of Morphine wasted in sink witnessed by Lulu Riding, RN. Eulas Post, RN

## 2016-10-21 ENCOUNTER — Encounter (HOSPITAL_COMMUNITY): Payer: Self-pay

## 2016-10-21 LAB — GLUCOSE, CAPILLARY
Glucose-Capillary: 119 mg/dL — ABNORMAL HIGH (ref 65–99)
Glucose-Capillary: 154 mg/dL — ABNORMAL HIGH (ref 65–99)
Glucose-Capillary: 124 mg/dL — ABNORMAL HIGH (ref 65–99)
Glucose-Capillary: 117 mg/dL — ABNORMAL HIGH (ref 65–99)

## 2016-10-21 MED ORDER — ACETAMINOPHEN 500 MG PO TABS
1000.0000 mg | ORAL_TABLET | Freq: Four times a day (QID) | ORAL | Status: DC
  Administered 2016-10-21 – 2016-10-23 (×8): 1000 mg via ORAL
  Filled 2016-10-21 (×10): qty 2

## 2016-10-21 NOTE — Progress Notes (Signed)
Pharmacy: Re-Entereg  Patient did not receive pre-op entereg.  Failure to administer the pre-op dose disqualifies the patient from receiving post-doses.  Plan: - entereg d/ced per P&T policy  Dia Sitter, PharmD, BCPS 10/21/2016 1:48 PM

## 2016-10-21 NOTE — Care Management Important Message (Signed)
Important Message  Patient Details  Name: Shane [name] MRN: [id number] Date of Birth: Jul 05, 1936   Medicare Important Message Given:  Yes    Kerin Salen 10/21/2016, [place] Message  Patient Details  Name: Shane [name] MRN: [id number] Date of Birth: November 09, 1935   Medicare Important Message Given:  Yes    Kerin Salen 10/21/2016, 11:22 AM

## 2016-10-21 NOTE — Consult Note (Signed)
[facility] Nurse ostomy follow up Stoma type/location: RUQ urostomy with two stents intact.  Pouch applied on Saturday is still intact. Stomal assessment/size: 1 and 1/2 inch round, moist, edematous. Peristomal assessment: Intact, minor bruising circumferentially. Minor depression ([place]) from 12-7 o'clock) Treatment options for stomal/peristomal skin: skin barrier ring Output: clear yellow urine Ostomy pouching: 1pc. Convex pouch with skin barrier ring Education provided: Extended session with wife and patient for purpose of pouch removal, skin cleansing, stoma sizing, preparation of pouching system including the stretching of the skin barrier ring and placement on the back of the pouching system. Wife cuts out pouch today, but observes placement of the pouch over the two stents. He is independent in connecting and disconnecting from the bedside urinary drainage bag. Three pouches are provided at bedside, three skin barrier rings, two adapters, educational booklet, 1-page tip sheet and sizing guide. Patient is aware that after stoma is the same size for a few weeks that they are able to order their pouches pre-cut.  [place] services have been ordered, but no discharge date has been set as patient has not yet had a BM, he is still uncomfortable despite walking frequently. Enrolled patient in [place] Start Discharge program: Yes [facility] nursing team will follow, and will remain available to this patient, the nursing and medical teams.  Please re-consult if needed. Maudie Flakes, MSN, RN, [place], Arther Abbott  Pager# [phone]

## 2016-10-21 NOTE — Progress Notes (Signed)
UROLOGY PROGRESS NOTES  Assessment/Plan: Shane [name] is a 81 y.o. male with a history of bilateral renal cysts, CAD, calcified pancreatitis, DM, Bell's palsy, HLD, HTN, MIBC s/p NAC now s/p RAL radical cystoprostatectomy, ileal conduit urinary diversion, bilateral pelvic lymph node dissection on 10/17/16 with Dr. Tresa Moore.   Interval/Plan: AFVSS. 2.4L UOP. 340cc JP drain output. JP Cr 1.0 = serum.   - Zosyn expired - PO tylenol - Continue clears, IVF, entereg - Ambulate - PT/WOCN consult   Subjective: No flatus/BM. Some nausea, bloating. No emesis. More discomfort in abdomen than true pain. Ambulating well (6 times yesterday).    Objective:  Vital signs in last 24 hours: Temp:  [98.9 F (37.2 C)-99.3 F (37.4 C)] 98.9 F (37.2 C) (04/10 0444) Pulse Rate:  [61-70] 70 (04/10 0444) Resp:  [16] 16 (04/10 0444) BP: (137-159)/(74-88) 156/88 (04/10 0444) SpO2:  [96 %-97 %] 97 % (04/10 0444)  04/09 0701 - 04/10 0700 In: 1971.3 [I.V.:1571.3; IV Piggyback:400] Out: 2815 [EUMPN:3614; Drains:340]    Physical Exam:  General:  well-developed and well-nourished male in NAD, lying in bed, alert & oriented, pleasant HEENT: [place]/AT, EOMI, sclera anicteric, hearing grossly intact, no nasal discharge, MMM Respiratory: nonlabored respirations, satting well on RA, symmetrical chest rise Cardiovascular: pulse regular rate & rhythm Abdominal: soft, appropriately TTP, mildly distended, surgical incisions c/d/i without signs of exudate/erythema with moderate surrounding ecchymosis GU: right urostomy draining yellow urine with bilateral bander stents visible, pink/healthy stoma, JP drain with SS drainage Extremities: warm, well-perfused, no c/c/e Neuro: no focal deficits   Data Review: Results for orders placed or performed during the hospital encounter of 10/16/16 (from the past 24 hour(s))  Glucose, capillary     Status: Abnormal   Collection Time: 10/20/16 11:49 AM  Result Value Ref  Range   Glucose-Capillary 139 (H) 65 - 99 mg/dL  Glucose, capillary     Status: Abnormal   Collection Time: 10/20/16  4:38 PM  Result Value Ref Range   Glucose-Capillary 114 (H) 65 - 99 mg/dL  Glucose, capillary     Status: Abnormal   Collection Time: 10/20/16 10:30 PM  Result Value Ref Range   Glucose-Capillary 109 (H) 65 - 99 mg/dL   Comment 1 Notify RN   Glucose, capillary     Status: Abnormal   Collection Time: 10/21/16  8:18 AM  Result Value Ref Range   Glucose-Capillary 119 (H) 65 - 99 mg/dL    Imaging: None  I have seen and examined the patient and agree with Dr. Isabel Caprice assesment and plan.  Briefly,  S: pain controlled, tollerating PO  O: NAD, AOx3, family at bedside Non-labored breathing RLQ Urostomy pink / patent SCD's in place.  A/P: Stop ABX, continue ambulation, goals for DC discussed.

## 2016-10-22 ENCOUNTER — Encounter: Payer: Self-pay | Admitting: Family Medicine

## 2016-10-22 LAB — GLUCOSE, CAPILLARY
Glucose-Capillary: 155 mg/dL — ABNORMAL HIGH (ref 65–99)
Glucose-Capillary: 171 mg/dL — ABNORMAL HIGH (ref 65–99)
Glucose-Capillary: 167 mg/dL — ABNORMAL HIGH (ref 65–99)
Glucose-Capillary: 127 mg/dL — ABNORMAL HIGH (ref 65–99)

## 2016-10-22 NOTE — Progress Notes (Signed)
UROLOGY PROGRESS NOTES  Assessment/Plan: Shane [name] is a 81 y.o. male with a history of bilateral renal cysts, CAD, calcified pancreatitis, DM, Bell's palsy, HLD, HTN, MIBC s/p NAC now s/p RAL radical cystoprostatectomy, ileal conduit urinary diversion, bilateral pelvic lymph node dissection on 10/17/16 with Dr. Tresa Moore.   Interval/Plan: AFVSS. Adequate UOP. 460 JP output. + BMs.  - [place], soft diet - Ambulate - PT/WOCN consult   Subjective: 2 small volume BMs. Lots of abdominal intestinal gurgling, audible on visit without auscultation. No significant nausea or emesis. Pain controlled. Hungry this mroning.    Objective:  Vital signs in last 24 hours: Temp:  [98.2 F (36.8 C)-99.9 F (37.7 C)] 98.2 F (36.8 C) (04/11 0446) Pulse Rate:  [78-82] 78 (04/11 0446) Resp:  [16-18] 16 (04/11 0446) BP: (138-159)/(68-91) 159/68 (04/11 0446) SpO2:  [95 %-97 %] 95 % (04/11 0446)  04/10 0701 - 04/11 0700 In: 1200 [I.V.:1200] Out: 2060 [Urine:1600; Drains:460]    Physical Exam:  General:  well-developed and well-nourished male in NAD, lying in bed, alert & oriented, pleasant HEENT: [place]/AT, EOMI, sclera anicteric, hearing grossly intact, no nasal discharge, MMM Respiratory: nonlabored respirations, satting well on RA, symmetrical chest rise Cardiovascular: pulse regular rate & rhythm Abdominal: soft, appropriately TTP, mildly distended, surgical incisions c/d/i without signs of exudate/erythema with moderate surrounding ecchymosis, active BS GU: right urostomy draining yellow urine with bilateral bander stents visible, pink/healthy stoma, JP drain with SS drainage Extremities: warm, well-perfused, no c/c/e Neuro: no focal deficits   Data Review: Results for orders placed or performed during the hospital encounter of 10/16/16 (from the past 24 hour(s))  Glucose, capillary     Status: Abnormal   Collection Time: 10/21/16 11:27 AM  Result Value Ref Range   Glucose-Capillary 154  (H) 65 - 99 mg/dL  Glucose, capillary     Status: Abnormal   Collection Time: 10/21/16  4:28 PM  Result Value Ref Range   Glucose-Capillary 124 (H) 65 - 99 mg/dL  Glucose, capillary     Status: Abnormal   Collection Time: 10/21/16  9:41 PM  Result Value Ref Range   Glucose-Capillary 117 (H) 65 - 99 mg/dL    Imaging: None  I have seen and examined the patient and agree with Dr. Isabel Caprice assesment and plan.   S: Some resumed BM's yesterday, no nausea , emesis. Ambulating well w/o PT.  O: NAD, AOx3 Non-labored breathing on room air RRR SNTND RLQ Urostomy pink / patent Jp serosanguinous / non-foul. SCD's in place  A/P: Doing quite well POD 5, ADV diet, likely DC 4/12 or 4/13 with [facility] based on current progress.

## 2016-10-22 NOTE — Progress Notes (Signed)
Chaplain completed Advance Directive with pt.  Spouse at bedside.  Copy of Adv Dir in chart.  Original and copies with pt.

## 2016-10-22 NOTE — Progress Notes (Signed)
MD, need HHRN orders with face to face.

## 2016-10-22 NOTE — Consult Note (Addendum)
[facility] Nurse ostomy follow up Stoma type/location: RUQ urostomy with two stents intact. Stomal assessment/size: 1 and 1/2 inches round, moist edematous Peristomal assessment: not seen today.  Pouch applied yesterday is intact. Treatment options for stomal/peristomal skin: skin barrier ring Output: clear yellow urine Ostomy pouching: 1pc.piece convex pouch with skin barrier ring Education provided: Patient feels puny today, somewhat weak from now eating and with intermittent abdominal discomfort from gas and surgery.  As noted by others, is walking in hall with wife-holding onto IV Pole or onto her.  Wife and patient are asking about use of a cane, perhaps a three-pronged cane and I report to Bedside RN, Gregary Signs that a request for PT to revisit would be helpful as they sort out these questions and if a cane is recommended, that they receive instruction. Next pouch change is due Friday or before, if leakage occurs. Enrolled patient in [place] Start Discharge program: Yes [place] nursing team will follow while in house, and will remain available to this patient, the nursing and medical teams.   Thanks, Maudie Flakes, MSN, RN, [place], Arther Abbott  Pager# [phone]

## 2016-10-23 LAB — GLUCOSE, CAPILLARY
Glucose-Capillary: 127 mg/dL — ABNORMAL HIGH (ref 65–99)
Glucose-Capillary: 206 mg/dL — ABNORMAL HIGH (ref 65–99)

## 2016-10-23 MED ORDER — HEPARIN SOD (PORK) LOCK FLUSH 100 UNIT/ML IV SOLN
500.0000 [IU] | Freq: Once | INTRAVENOUS | Status: AC
  Administered 2016-10-23: 500 [IU] via INTRAVENOUS

## 2016-10-23 MED ORDER — TRAMADOL HCL 50 MG PO TABS: 50.0000 mg | ORAL_TABLET | Freq: Four times a day (QID) | ORAL | Status: DC | PRN

## 2016-10-23 MED ORDER — HEPARIN SOD (PORK) LOCK FLUSH 100 UNIT/ML IV SOLN: 500.0000 [IU] | Freq: Once | INTRAVENOUS | Status: DC

## 2016-10-23 MED ORDER — SENNOSIDES-DOCUSATE SODIUM 8.6-50 MG PO TABS: 1.0000 | ORAL_TABLET | Freq: Two times a day (BID) | ORAL | 0 refills | Status: DC

## 2016-10-23 NOTE — Consult Note (Signed)
[facility] Nurse ostomy follow up Patient is preparing for discharge, has had BMs and has decreasing discomfort.  PT evaluation/discussion regarding the use of a cane has not occurred and wife inquires about this again with me.  I have passed along to the bedside RN today. Wife asks me to reinforce that nighttime urinary drainage bag is for bed/night use-patient she fears will be using it more than that.  We have this discussion and patient admits to relying on this at this time, but assures me he will progress and rely on it less. Question answered about an extension tube for use at the bottom of the pouch to improve patient's "aim" while emptying; we discuss purpose of drip cap and how this will not be available if an extension tube is used.  I reiterate the importance of practice both in emptying pouch and learning what it feels like for pouch filling. Patient has supplies for discharge, is established with [facility] and the plan is for discharge later today. [facility] nursing team will follow while in house and will remain available to this patient, the nursing and medical teams after discharge. Thanks, Maudie Flakes, MSN, RN, [place], Arther Abbott  Pager# [phone]

## 2016-10-23 NOTE — Discharge Instructions (Signed)

## 2016-10-23 NOTE — Progress Notes (Signed)
Wife had no preference [facility]. Referral given to in house rep.

## 2016-10-23 NOTE — Progress Notes (Signed)
Pt discharged home with wife. Discharge instructions given and all questions answered. Ileostomy instructions discussed and pt and wife demonstrated proper technique.

## 2016-10-24 DIAGNOSIS — Z906 Acquired absence of other parts of urinary tract: Secondary | ICD-10-CM | POA: Diagnosis not present

## 2016-10-24 DIAGNOSIS — Z483 Aftercare following surgery for neoplasm: Secondary | ICD-10-CM | POA: Diagnosis not present

## 2016-10-24 DIAGNOSIS — Z436 Encounter for attention to other artificial openings of urinary tract: Secondary | ICD-10-CM | POA: Diagnosis not present

## 2016-10-24 DIAGNOSIS — Z87891 Personal history of nicotine dependence: Secondary | ICD-10-CM | POA: Diagnosis not present

## 2016-10-24 DIAGNOSIS — Z9079 Acquired absence of other genital organ(s): Secondary | ICD-10-CM | POA: Diagnosis not present

## 2016-10-24 DIAGNOSIS — N281 Cyst of kidney, acquired: Secondary | ICD-10-CM | POA: Diagnosis not present

## 2016-10-24 DIAGNOSIS — C679 Malignant neoplasm of bladder, unspecified: Secondary | ICD-10-CM | POA: Diagnosis not present

## 2016-10-24 DIAGNOSIS — I10 Essential (primary) hypertension: Secondary | ICD-10-CM | POA: Diagnosis not present

## 2016-10-24 DIAGNOSIS — E119 Type 2 diabetes mellitus without complications: Secondary | ICD-10-CM | POA: Diagnosis not present

## 2016-10-24 DIAGNOSIS — K861 Other chronic pancreatitis: Secondary | ICD-10-CM | POA: Diagnosis not present

## 2016-10-24 DIAGNOSIS — Z7984 Long term (current) use of oral hypoglycemic drugs: Secondary | ICD-10-CM | POA: Diagnosis not present

## 2016-10-24 DIAGNOSIS — I2510 Atherosclerotic heart disease of native coronary artery without angina pectoris: Secondary | ICD-10-CM | POA: Diagnosis not present

## 2016-10-28 DIAGNOSIS — E119 Type 2 diabetes mellitus without complications: Secondary | ICD-10-CM | POA: Diagnosis not present

## 2016-10-28 DIAGNOSIS — Z483 Aftercare following surgery for neoplasm: Secondary | ICD-10-CM | POA: Diagnosis not present

## 2016-10-28 DIAGNOSIS — I2510 Atherosclerotic heart disease of native coronary artery without angina pectoris: Secondary | ICD-10-CM | POA: Diagnosis not present

## 2016-10-28 DIAGNOSIS — Z436 Encounter for attention to other artificial openings of urinary tract: Secondary | ICD-10-CM | POA: Diagnosis not present

## 2016-10-28 DIAGNOSIS — I10 Essential (primary) hypertension: Secondary | ICD-10-CM | POA: Diagnosis not present

## 2016-10-28 DIAGNOSIS — C679 Malignant neoplasm of bladder, unspecified: Secondary | ICD-10-CM | POA: Diagnosis not present

## 2016-10-31 DIAGNOSIS — E119 Type 2 diabetes mellitus without complications: Secondary | ICD-10-CM | POA: Diagnosis not present

## 2016-10-31 DIAGNOSIS — I10 Essential (primary) hypertension: Secondary | ICD-10-CM | POA: Diagnosis not present

## 2016-10-31 DIAGNOSIS — Z483 Aftercare following surgery for neoplasm: Secondary | ICD-10-CM | POA: Diagnosis not present

## 2016-10-31 DIAGNOSIS — I2510 Atherosclerotic heart disease of native coronary artery without angina pectoris: Secondary | ICD-10-CM | POA: Diagnosis not present

## 2016-10-31 DIAGNOSIS — Z436 Encounter for attention to other artificial openings of urinary tract: Secondary | ICD-10-CM | POA: Diagnosis not present

## 2016-10-31 DIAGNOSIS — C679 Malignant neoplasm of bladder, unspecified: Secondary | ICD-10-CM | POA: Diagnosis not present

## 2016-11-04 DIAGNOSIS — C679 Malignant neoplasm of bladder, unspecified: Secondary | ICD-10-CM | POA: Diagnosis not present

## 2016-11-04 DIAGNOSIS — I10 Essential (primary) hypertension: Secondary | ICD-10-CM | POA: Diagnosis not present

## 2016-11-04 DIAGNOSIS — Z483 Aftercare following surgery for neoplasm: Secondary | ICD-10-CM | POA: Diagnosis not present

## 2016-11-04 DIAGNOSIS — I2510 Atherosclerotic heart disease of native coronary artery without angina pectoris: Secondary | ICD-10-CM | POA: Diagnosis not present

## 2016-11-04 DIAGNOSIS — E119 Type 2 diabetes mellitus without complications: Secondary | ICD-10-CM | POA: Diagnosis not present

## 2016-11-04 DIAGNOSIS — Z436 Encounter for attention to other artificial openings of urinary tract: Secondary | ICD-10-CM | POA: Diagnosis not present

## 2016-11-10 DIAGNOSIS — C672 Malignant neoplasm of lateral wall of bladder: Secondary | ICD-10-CM | POA: Diagnosis not present

## 2016-11-13 ENCOUNTER — Encounter: Payer: Self-pay | Admitting: *Deleted

## 2016-11-14 ENCOUNTER — Encounter: Payer: Self-pay | Admitting: Family Medicine

## 2016-11-26 ENCOUNTER — Encounter: Payer: Self-pay | Admitting: Family Medicine

## 2016-11-27 ENCOUNTER — Other Ambulatory Visit (HOSPITAL_BASED_OUTPATIENT_CLINIC_OR_DEPARTMENT_OTHER): Payer: Medicare Other

## 2016-11-27 ENCOUNTER — Ambulatory Visit (HOSPITAL_BASED_OUTPATIENT_CLINIC_OR_DEPARTMENT_OTHER): Payer: Medicare Other | Admitting: Oncology

## 2016-11-27 ENCOUNTER — Telehealth: Payer: Self-pay | Admitting: Oncology

## 2016-11-27 ENCOUNTER — Ambulatory Visit (HOSPITAL_BASED_OUTPATIENT_CLINIC_OR_DEPARTMENT_OTHER): Payer: Medicare Other

## 2016-11-27 VITALS — BP 112/65 | HR 69 | Temp 98.3°F | Resp 18 | Ht 68.0 in | Wt 193.2 lb

## 2016-11-27 DIAGNOSIS — C7989 Secondary malignant neoplasm of other specified sites: Secondary | ICD-10-CM

## 2016-11-27 DIAGNOSIS — C675 Malignant neoplasm of bladder neck: Secondary | ICD-10-CM

## 2016-11-27 DIAGNOSIS — Z95828 Presence of other vascular implants and grafts: Secondary | ICD-10-CM

## 2016-11-27 DIAGNOSIS — C679 Malignant neoplasm of bladder, unspecified: Secondary | ICD-10-CM

## 2016-11-27 LAB — CBC WITH DIFFERENTIAL/PLATELET
BASO%: 1.1 % (ref 0.0–2.0)
Basophils Absolute: 0.1 10*3/uL (ref 0.0–0.1)
EOS%: 2.1 % (ref 0.0–7.0)
Eosinophils Absolute: 0.1 10*3/uL (ref 0.0–0.5)
HCT: 36.2 % — ABNORMAL LOW (ref 38.4–49.9)
HGB: 12.1 g/dL — ABNORMAL LOW (ref 13.0–17.1)
LYMPH%: 34.6 % (ref 14.0–49.0)
MCH: 30.2 pg (ref 27.2–33.4)
MCHC: 33.4 g/dL (ref 32.0–36.0)
MCV: 90.4 fL (ref 79.3–98.0)
MONO#: 0.5 10*3/uL (ref 0.1–0.9)
MONO%: 9.1 % (ref 0.0–14.0)
NEUT#: 2.7 10*3/uL (ref 1.5–6.5)
NEUT%: 53.1 % (ref 39.0–75.0)
Platelets: 257 10*3/uL (ref 140–400)
RBC: 4.01 10*6/uL — ABNORMAL LOW (ref 4.20–5.82)
RDW: 13.8 % (ref 11.0–14.6)
WBC: 5.1 10*3/uL (ref 4.0–10.3)
lymph#: 1.8 10*3/uL (ref 0.9–3.3)

## 2016-11-27 LAB — COMPREHENSIVE METABOLIC PANEL
ALT: 23 U/L (ref 0–55)
AST: 19 U/L (ref 5–34)
Albumin: 3.2 g/dL — ABNORMAL LOW (ref 3.5–5.0)
Alkaline Phosphatase: 74 U/L (ref 40–150)
Anion Gap: 9 mEq/L (ref 3–11)
BUN: 38.4 mg/dL — ABNORMAL HIGH (ref 7.0–26.0)
CO2: 22 mEq/L (ref 22–29)
Calcium: 9.7 mg/dL (ref 8.4–10.4)
Chloride: 106 mEq/L (ref 98–109)
Creatinine: 1.1 mg/dL (ref 0.7–1.3)
EGFR: 66 mL/min/{1.73_m2} — ABNORMAL LOW (ref >90)
Glucose: 112 mg/dl (ref 70–140)
Potassium: 4.5 mEq/L (ref 3.5–5.1)
Sodium: 137 mEq/L (ref 136–145)
Total Bilirubin: 0.44 mg/dL (ref 0.20–1.20)
Total Protein: 7.3 g/dL (ref 6.4–8.3)

## 2016-11-27 MED ORDER — SODIUM CHLORIDE 0.9% FLUSH
10.0000 mL | INTRAVENOUS | Status: DC | PRN
  Administered 2016-11-27: 10 mL via INTRAVENOUS
  Filled 2016-11-27: qty 10

## 2016-11-27 MED ORDER — HEPARIN SOD (PORK) LOCK FLUSH 100 UNIT/ML IV SOLN
500.0000 [IU] | Freq: Once | INTRAVENOUS | Status: AC
  Administered 2016-11-27: 500 [IU] via INTRAVENOUS
  Filled 2016-11-27: qty 5

## 2016-11-27 NOTE — Progress Notes (Signed)
Hematology and Oncology Follow Up Visit  Shane [name] [id number] [birth year] 81 y.o. 11/27/2016 9:34 AM McGowen, [name], MDMcGowen, [name], MD   Principle Diagnosis: 81 year old gentleman with invasive high-grade urothelial carcinoma of the bladder diagnosed in September 2017. He has T2 N0 disease.   Prior Therapy:  He is status post TURBT on 04/02/2016 which confirmed the tumor invades into the muscularis propria. He has no evidence of metastatic disease based on his abdominal imaging as well as chest x-ray. This was completed on 04/02/2016.  Neoadjuvant chemotherapy utilizing gemcitabine and cisplatin day 1 of cycle 1 of therapy was on 05/16/2016. He is status post 3 cycles of therapy completed in December 2017.  He is status post robotic-assisted laparoscopic radical cystoprostatectomy and bilateral pelvic lymphadenectomy completed by Dr. Tresa Moore on 10/17/2016. Final pathology revealed no residual tumor.   Current therapy: Observation and surveillance.   Interim History: Shane [name] presents today for a follow-up visit. Since the last visit, he underwent radical cystoprostatectomy on April 6 and tolerated the procedure well. He has recovered reasonably well at this time although not quite back to his baseline function. He is ambulating without any major difficulties as well as driving. His appetite has been normal at this time and started to gain his weight back.  He did not reveal residual complications from chemotherapy including neuropathy or hearing deficits. He denied any abdominal pain or hematuria. His quality of life is back to normal.  He does not report any headaches, blurry vision, syncope or seizures. He does not report any fevers or chills or sweats. He does not report any cough, wheezing or hemoptysis. He does not report any nausea, vomiting or abdominal pain. He does not report any constipation, diarrhea or change in his bowel habits. He does not report any frequency,  urgency or hesitancy. He does not report any skeletal complaints of arthralgias or myalgias. The remaining review of systems unremarkable.    Medications: I have reviewed the patient's current medications.  Current Outpatient Prescriptions  Medication Sig Dispense Refill  . clotrimazole (LOTRIMIN) 1 % cream Apply 1 application topically daily as needed (fungus).    [name] lidocaine-prilocaine (EMLA) cream Apply 1 application topically as needed. Apply to port before chemotherapy. 30 g 0  . lisinopril (PRINIVIL,ZESTRIL) 20 MG tablet Take 20 mg by mouth 2 (two) times daily.    [name] omega-3 acid ethyl esters (LOVAZA) 1 g capsule Take 1 capsule (1 g total) by mouth daily. 90 capsule 3  . pravastatin (PRAVACHOL) 20 MG tablet Take 1 tablet (20 mg total) by mouth daily. 90 tablet 3  . prochlorperazine (COMPAZINE) 10 MG tablet Take 1 tablet (10 mg total) by mouth every 6 (six) hours as needed for nausea or vomiting. 30 tablet 0  . senna-docusate (SENOKOT-S) 8.6-50 MG tablet Take 1 tablet by mouth 2 (two) times daily. As needed to prevent constipation while taking strong pain meds. 30 tablet 0  . sitaGLIPtin-metformin (JANUMET) 50-1000 MG tablet Take 1 tablet by mouth 2 (two) times daily with a meal. 180 tablet 3  . traMADol (ULTRAM) 50 MG tablet Take 1-2 tablets (50-100 mg total) by mouth every 6 (six) hours as needed for moderate pain or severe pain. Post-operatively 20 tablet 00   No current facility-administered medications for this visit.      Allergies: No Known Allergies  Past Medical History, Surgical history, Social history, and Family History were reviewed and updated.   Physical Exam: Blood pressure 112/65, pulse 69, temperature 98.3  F (36.8 C), temperature source Oral, resp. rate 18, height 5\' 8"  (1.727 m), weight 193 lb 3.2 oz (87.6 kg), SpO2 99 %. ECOG: 1 General appearance: Alert, awake comfortable gentleman without distress. Head: Normocephalic, without obvious abnormality no oral  ulcers or thrush. Neck: no adenopathy or masses. Lymph nodes: Cervical, supraclavicular, and axillary nodes normal. Heart:regular rate and rhythm, S1, S2 normal, no murmur, click, rub or gallop Lung:chest clear, no wheezing, rales, normal symmetric air entry Abdomin: soft, non-tender, without masses or organomegaly no masses noted. Urostomy bag in place. EXT:no erythema, induration, or nodules   Lab Results: Lab Results  Component Value Date   WBC 5.1 11/27/2016   HGB 12.1 (L) 11/27/2016   HCT 36.2 (L) 11/27/2016   MCV 90.4 11/27/2016   PLT 257 11/27/2016     Chemistry      Component Value Date/Time   NA 135 10/20/2016 0349   NA 140 09/19/2016 1203   K 4.1 10/20/2016 0349   K 4.3 09/19/2016 1203   CL 101 10/20/2016 0349   CO2 25 10/20/2016 0349   CO2 22 09/19/2016 1203   BUN 13 10/20/2016 0349   BUN 19.6 09/19/2016 1203   CREATININE 0.91 10/20/2016 0349   CREATININE 0.9 09/19/2016 1203      Component Value Date/Time   CALCIUM 8.2 (L) 10/20/2016 0349   CALCIUM 9.5 09/19/2016 1203   ALKPHOS 45 10/16/2016 1710   ALKPHOS 58 09/19/2016 1203   AST 18 10/16/2016 1710   AST 13 09/19/2016 1203   ALT 12 (L) 10/16/2016 1710   ALT 9 09/19/2016 1203   BILITOT 0.9 10/16/2016 1710   BILITOT 0.35 09/19/2016 2963        81 year old gentleman with the following issues:  1. Invasive high-grade urothelial carcinoma of the bladder after presenting with hematuria and difficulty urination in September 2017. He is status post TURBT on 04/02/2016 which confirmed the tumor invades into the muscularis propria. He has no evidence of metastatic disease based on his abdominal imaging as well as chest x-ray.  He is status post 3 cycles of chemotherapy utilizing gemcitabine and cisplatin I was well-tolerated.  CT scan chest abdomen and pelvis done on 07/23/2016 showed no evidence of advanced disease with decrease in the size of his bladder tumor.  He is status post cystoprostatectomy and  lymphadenectomy in April 2018. No residual malignancy noted at the time.  The plan is to proceed with observation and surveillance and repeat imaging studies in 3 months. He does not require any additional therapy at this time.   2. IV access: Port-A-Cath will continue to be flushed every 6 weeks. We will consider Port-A-Cath removal after his next CT scan.  3. Follow-up: Will be in August 2018.  [facility], MD 5/17/20189:34 AM

## 2016-11-27 NOTE — Telephone Encounter (Signed)
Appointments scheduled per 11/27/16 los. Patient was given a copy of the AVS report and appointment schedule per 11/27/16 los. °

## 2016-11-27 NOTE — Patient Instructions (Signed)

## 2016-12-09 DIAGNOSIS — C672 Malignant neoplasm of lateral wall of bladder: Secondary | ICD-10-CM | POA: Diagnosis not present

## 2016-12-12 NOTE — Addendum Note (Signed)
Addendum  created 12/12/16 1325 by Deairra Halleck D, MD   Sign clinical note    

## 2016-12-16 ENCOUNTER — Encounter: Payer: Self-pay | Admitting: Family Medicine

## 2017-01-16 ENCOUNTER — Ambulatory Visit (HOSPITAL_BASED_OUTPATIENT_CLINIC_OR_DEPARTMENT_OTHER): Payer: Medicare Other

## 2017-01-16 DIAGNOSIS — Z452 Encounter for adjustment and management of vascular access device: Secondary | ICD-10-CM | POA: Diagnosis not present

## 2017-01-16 DIAGNOSIS — C675 Malignant neoplasm of bladder neck: Secondary | ICD-10-CM | POA: Diagnosis not present

## 2017-01-16 DIAGNOSIS — Z95828 Presence of other vascular implants and grafts: Secondary | ICD-10-CM

## 2017-01-16 MED ORDER — SODIUM CHLORIDE 0.9% FLUSH
10.0000 mL | INTRAVENOUS | Status: DC | PRN
  Administered 2017-01-16: 10 mL via INTRAVENOUS
  Filled 2017-01-16: qty 10

## 2017-01-16 MED ORDER — HEPARIN SOD (PORK) LOCK FLUSH 100 UNIT/ML IV SOLN
500.0000 [IU] | Freq: Once | INTRAVENOUS | Status: AC
  Administered 2017-01-16: 500 [IU] via INTRAVENOUS
  Filled 2017-01-16: qty 5

## 2017-01-16 NOTE — Patient Instructions (Signed)

## 2017-02-25 ENCOUNTER — Ambulatory Visit (HOSPITAL_BASED_OUTPATIENT_CLINIC_OR_DEPARTMENT_OTHER): Payer: Medicare Other

## 2017-02-25 ENCOUNTER — Other Ambulatory Visit (HOSPITAL_BASED_OUTPATIENT_CLINIC_OR_DEPARTMENT_OTHER): Payer: Medicare Other

## 2017-02-25 DIAGNOSIS — Z95828 Presence of other vascular implants and grafts: Secondary | ICD-10-CM | POA: Insufficient documentation

## 2017-02-25 DIAGNOSIS — C679 Malignant neoplasm of bladder, unspecified: Secondary | ICD-10-CM

## 2017-02-25 DIAGNOSIS — C675 Malignant neoplasm of bladder neck: Secondary | ICD-10-CM

## 2017-02-25 LAB — CBC WITH DIFFERENTIAL/PLATELET
BASO%: 0.1 % (ref 0.0–2.0)
Basophils Absolute: 0 10*3/uL (ref 0.0–0.1)
EOS%: 1.9 % (ref 0.0–7.0)
Eosinophils Absolute: 0.1 10*3/uL (ref 0.0–0.5)
HCT: 41.6 % (ref 38.4–49.9)
HGB: 13.9 g/dL (ref 13.0–17.1)
LYMPH%: 39.8 % (ref 14.0–49.0)
MCH: 30.8 pg (ref 27.2–33.4)
MCHC: 33.5 g/dL (ref 32.0–36.0)
MCV: 92 fL (ref 79.3–98.0)
MONO#: 0.5 10*3/uL (ref 0.1–0.9)
MONO%: 7.4 % (ref 0.0–14.0)
NEUT#: 3.2 10*3/uL (ref 1.5–6.5)
NEUT%: 50.8 % (ref 39.0–75.0)
Platelets: 181 10*3/uL (ref 140–400)
RBC: 4.53 10*6/uL (ref 4.20–5.82)
RDW: 14 % (ref 11.0–14.6)
WBC: 6.4 10*3/uL (ref 4.0–10.3)
lymph#: 2.5 10*3/uL (ref 0.9–3.3)

## 2017-02-25 LAB — COMPREHENSIVE METABOLIC PANEL
ALT: 12 U/L (ref 0–55)
AST: 14 U/L (ref 5–34)
Albumin: 3.8 g/dL (ref 3.5–5.0)
Alkaline Phosphatase: 64 U/L (ref 40–150)
Anion Gap: 10 mEq/L (ref 3–11)
BUN: 23.8 mg/dL (ref 7.0–26.0)
CO2: 25 mEq/L (ref 22–29)
Calcium: 9.8 mg/dL (ref 8.4–10.4)
Chloride: 107 mEq/L (ref 98–109)
Creatinine: 1.1 mg/dL (ref 0.7–1.3)
EGFR: 66 mL/min/{1.73_m2} — ABNORMAL LOW (ref >90)
Glucose: 116 mg/dl (ref 70–140)
Potassium: 4.2 mEq/L (ref 3.5–5.1)
Sodium: 141 mEq/L (ref 136–145)
Total Bilirubin: 0.54 mg/dL (ref 0.20–1.20)
Total Protein: 7.3 g/dL (ref 6.4–8.3)

## 2017-02-25 MED ORDER — SODIUM CHLORIDE 0.9% FLUSH
10.0000 mL | Freq: Once | INTRAVENOUS | Status: AC
  Administered 2017-02-25: 10 mL
  Filled 2017-02-25: qty 10

## 2017-02-25 MED ORDER — HEPARIN SOD (PORK) LOCK FLUSH 100 UNIT/ML IV SOLN
500.0000 [IU] | Freq: Once | INTRAVENOUS | Status: AC
  Administered 2017-02-25: 500 [IU]
  Filled 2017-02-25: qty 5

## 2017-02-27 ENCOUNTER — Telehealth: Payer: Self-pay | Admitting: Oncology

## 2017-02-27 ENCOUNTER — Ambulatory Visit (HOSPITAL_BASED_OUTPATIENT_CLINIC_OR_DEPARTMENT_OTHER): Payer: Medicare Other | Admitting: Oncology

## 2017-02-27 VITALS — BP 128/62 | HR 61 | Temp 98.6°F | Resp 18 | Ht 68.0 in | Wt 205.1 lb

## 2017-02-27 DIAGNOSIS — C679 Malignant neoplasm of bladder, unspecified: Secondary | ICD-10-CM

## 2017-02-27 DIAGNOSIS — C675 Malignant neoplasm of bladder neck: Secondary | ICD-10-CM

## 2017-02-27 NOTE — Progress Notes (Signed)
Hematology and Oncology Follow Up Visit  Shane [name] [id number] May 07, 1936 81 y.o. 02/27/2017 9:53 AM McGowen, Shane [name], MDMcGowen, Shane Blackwater, MD   Principle Diagnosis: 81 year old gentleman with invasive high-grade urothelial carcinoma of the bladder diagnosed in September 2017. He has T2 N0 disease.   Prior Therapy:  He is status post TURBT on 04/02/2016 which confirmed the tumor invades into the muscularis propria. He has no evidence of metastatic disease based on his abdominal imaging as well as chest x-ray. This was completed on 04/02/2016.  Neoadjuvant chemotherapy utilizing gemcitabine and cisplatin day 1 of cycle 1 of therapy was on 05/16/2016. He is status post 3 cycles of therapy completed in December 2017.  He is status post robotic-assisted laparoscopic radical cystoprostatectomy and bilateral pelvic lymphadenectomy completed by Dr. Tresa Moore on 10/17/2016. Final pathology revealed no residual tumor.   Current therapy: Observation and surveillance.   Interim History: Shane [name] presents today for a follow-up visit. Since the last visit, he reports no major changes in his health. He has fully recovered from his radical cystoprostatectomy on April 6. He is ambulating without any major difficulties as well as driving. His appetite has been normal at this time and started to gain his weight back. He does report some hearing deficits on the right side but no neuropathy noted. His hearing on the left side appears to be intact. He continues to enjoy a reasonable quality of life without any decline.  He does not report any headaches, blurry vision, syncope or seizures. He does not report any fevers or chills or sweats. He does not report any cough, wheezing or hemoptysis. He does not report any nausea, vomiting or abdominal pain. He does not report any constipation, diarrhea or change in his bowel habits. He does not report any frequency, urgency or hesitancy. He does not report any  skeletal complaints of arthralgias or myalgias. The remaining review of systems unremarkable.    Medications: I have reviewed the patient's current medications.  Current Outpatient Prescriptions  Medication Sig Dispense Refill  . clotrimazole (LOTRIMIN) 1 % cream Apply 1 application topically daily as needed (fungus).    [name] lidocaine-prilocaine (EMLA) cream Apply 1 application topically as needed. Apply to port before chemotherapy. 30 g 0  . lisinopril (PRINIVIL,ZESTRIL) 20 MG tablet Take 20 mg by mouth 2 (two) times daily.    [name] omega-3 acid ethyl esters (LOVAZA) 1 g capsule Take 1 capsule (1 g total) by mouth daily. 90 capsule 3  . pravastatin (PRAVACHOL) 20 MG tablet Take 1 tablet (20 mg total) by mouth daily. 90 tablet 3  . prochlorperazine (COMPAZINE) 10 MG tablet Take 1 tablet (10 mg total) by mouth every 6 (six) hours as needed for nausea or vomiting. 30 tablet 0  . senna-docusate (SENOKOT-S) 8.6-50 MG tablet Take 1 tablet by mouth 2 (two) times daily. As needed to prevent constipation while taking strong pain meds. 30 tablet 0  . sitaGLIPtin-metformin (JANUMET) 50-1000 MG tablet Take 1 tablet by mouth 2 (two) times daily with a meal. 180 tablet 3  . traMADol (ULTRAM) 50 MG tablet Take 1-2 tablets (50-100 mg total) by mouth every 6 (six) hours as needed for moderate pain or severe pain. Post-operatively 20 tablet 00   No current facility-administered medications for this visit.      Allergies: No Known Allergies  Past Medical History, Surgical history, Social history, and Family History were reviewed and updated.   Physical Exam: Blood pressure 128/62, pulse 61, temperature 98.6 F (37  C), temperature source Oral, resp. rate 18, height 5\' 8"  (1.727 m), weight 205 lb 1.6 oz (93 kg), SpO2 99 %. ECOG: 1 General appearance: Well-appearing gentleman without distress. Head: Normocephalic, without obvious abnormality no oral ulcers or thrush. Neck: no adenopathy or masses. Lymph nodes:  Cervical, supraclavicular, and axillary nodes normal. Heart:regular rate and rhythm, S1, S2 normal, no murmur, click, rub or gallop Lung:chest clear, no wheezing, rales, normal symmetric air entry Port-A-Cath site appear intact. Abdomin: soft, non-tender, without masses or organomegaly shifting dullness or ascites. EXT:no erythema, induration, or nodules   Lab Results: Lab Results  Component Value Date   WBC 6.4 02/25/2017   HGB 13.9 02/25/2017   HCT 41.6 02/25/2017   MCV 92.0 02/25/2017   PLT 181 02/25/2017     Chemistry      Component Value Date/Time   NA 141 02/25/2017 1143   K 4.2 02/25/2017 1143   CL 101 10/20/2016 0349   CO2 25 02/25/2017 1143   BUN 23.8 02/25/2017 1143   CREATININE 1.1 02/25/2017 1143      Component Value Date/Time   CALCIUM 9.8 02/25/2017 1143   ALKPHOS 64 02/25/2017 1143   AST 14 02/25/2017 1143   ALT 12 02/25/2017 1143   BILITOT 0.54 02/25/2017 62        81 year old gentleman with the following issues:  1. Invasive high-grade urothelial carcinoma of the bladder after presenting with hematuria and difficulty urination in September 2017. He is status post TURBT on 04/02/2016 which confirmed the tumor invades into the muscularis propria. He has no evidence of metastatic disease based on his abdominal imaging as well as chest x-ray.  He is status post 3 cycles of chemotherapy utilizing gemcitabine and cisplatin I was well-tolerated.  CT scan chest abdomen and pelvis done on 07/23/2016 showed no evidence of advanced disease with decrease in the size of his bladder tumor.  He is status post cystoprostatectomy and lymphadenectomy in April 2018. No residual malignancy noted at the time.  He is currently on observation and surveillance and scheduled to have repeat imaging studies in September 2018.   2. IV access: Port-A-Cath will be removed if his CT scan in September showed no evidence of disease.  3. Follow-up: Will be in December  2018.  [facility], MD 8/17/20189:53 AM

## 2017-02-27 NOTE — Telephone Encounter (Signed)
Added appt for Dec. Gave pt avs and calendar for appts.

## 2017-03-02 ENCOUNTER — Encounter: Payer: Self-pay | Admitting: Family Medicine

## 2017-03-02 ENCOUNTER — Ambulatory Visit (INDEPENDENT_AMBULATORY_CARE_PROVIDER_SITE_OTHER): Payer: Medicare Other | Admitting: Family Medicine

## 2017-03-02 ENCOUNTER — Telehealth: Payer: Self-pay

## 2017-03-02 VITALS — BP 126/73 | HR 56 | Temp 98.1°F | Resp 16 | Ht 68.0 in | Wt 203.8 lb

## 2017-03-02 DIAGNOSIS — H918X3 Other specified hearing loss, bilateral: Secondary | ICD-10-CM

## 2017-03-02 DIAGNOSIS — H6123 Impacted cerumen, bilateral: Secondary | ICD-10-CM

## 2017-03-02 MED ORDER — METFORMIN HCL 1000 MG PO TABS: 1000.0000 mg | ORAL_TABLET | Freq: Two times a day (BID) | ORAL | 6 refills | Status: DC

## 2017-03-02 NOTE — Telephone Encounter (Signed)
I did this today after he left the office.  Thx

## 2017-03-02 NOTE — Telephone Encounter (Signed)
Patient stated that he wanted to change [place] to Metformin due to cost.  He has been on Metformin in the past with no problems.  Patient stated that he has been out of his Janumet for the past 3 - 4 weeks and Blood sugar has been averaging around 116.

## 2017-03-02 NOTE — Addendum Note (Signed)
Addended by: Tammi Sou on: 03/02/2017 10:38 AM   Modules accepted: Orders

## 2017-03-02 NOTE — Progress Notes (Addendum)
OFFICE VISIT  03/02/2017   CC:  Chief Complaint  Patient presents with  . Hearing Problem    HPI:    Patient is a 81 y.o. Caucasian male who presents for left ear hearing difficulty. He says that since chemotherapy for his hearing has been impaired consistently. He says it feels lately like R ear feels full, can tap on mastoid area and the noise is pretty loud. No ear pain.  No ringing.  Past Medical History:  Diagnosis Date  . Abnormal EKG 2014   This led to stress test which was abnl, which led to cardiology referral: cath showed small vessel dz of 2nd diag branch with 70-80% stenosis--preserved LV function  . Bilateral renal cysts    Non-complex (CT 03/2016): no enhancement, coarse calcifications, mass effect does give false appearance of hydro on non-contract series (followed by urol).  . Bladder cancer ([place]) 03/2016   Invasive high grade urothelial carcinoma.  No metastatic spread at time of dx.  Cystoscopy + bx and partial resection of tumor in hospital (when admitted for gross hematuria and urinary retention).  Neoadjuvant chemo as of 06/2016, then got cystoprostatectomy. (Urostomy--ileal conduit urinary diversion with refluxing anastamoses)-gets routine surveillance by urol (Dr. Tresa Moore)  . CAD (coronary artery disease)    small vessel dz x 1 vessel on cath 2014: med mgmt  . Chronic calcific pancreatitis ([place]) 03/2016   CT: also small cyst in head of pancreas that needs 6 mo f/u imaging (09/2016)  . Diabetes mellitus without complication ([place])   . Heart murmur, systolic    Noted in prior PCP's records.  I recommended echocardiogram 07/2016 but pt declined.  [name] History of Bell's palsy   . Hyperlipidemia   . Hypertension   . Pancreas cyst 03/2016   Repeat imaging with MRI pancreas protocol 09/2016  . Umbilical hernia     Past Surgical History:  Procedure Laterality Date  . CARDIAC CATHETERIZATION  02/09/2013   small vessel dz of 2nd diag branch with 70-80%  stenosis--preserved LV function. Medical therapy rec'd. Agustin Cree, PA)  . CARDIOVASCULAR STRESS TEST  01/26/2013   No ischemia.  Wall motion abnormalities at inferior base suggesting small area of infarction.  EF 64%.  [name] CATARACT EXTRACTION    . cystoprostatectomy w/LN surgery  10/17/2016   Ileal conduit: this is an incontinent urine diversion that directs urine from the ureters through a segment of isolated bowel to the surface of the abdominal wall via a cutaneous stoma, and urine drains continuously into an external ostomy appliance.  . CYSTOSCOPY WITH INJECTION N/A 10/17/2016   Procedure: CYSTOSCOPY WITH INJECTION OF INDOCYANINE GREEN DYE;  Surgeon: Alexis Frock, MD;  Location: [facility];  Service: Urology;  Laterality: N/A;  . IR GENERIC HISTORICAL  05/12/2016   IR US GUIDE VASC ACCESS RIGHT 05/12/2016 Greggory Keen, MD [facility]  . IR GENERIC HISTORICAL  05/12/2016   IR FLUORO GUIDE PORT INSERTION RIGHT 05/12/2016 Greggory Keen, MD [facility]  . TONSILLECTOMY AND ADENOIDECTOMY  Remote  . TRANSURETHRAL RESECTION OF PROSTATE N/A 04/02/2016   Procedure: CYSTOSCOPY, TRANSURETHRAL RESECTION OF BLADDER TUMOR;  Surgeon: [name], MD;  Location: [facility];  Service: Urology;  Laterality: N/A;  . UMBILICAL HERNIA REPAIR  10/2016  . VASECTOMY      Outpatient Medications Prior to Visit  Medication Sig Dispense Refill  . clotrimazole (LOTRIMIN) 1 % cream Apply 1 application topically daily as needed (fungus).    [name] lidocaine-prilocaine (EMLA) cream Apply 1 application topically as needed.  Apply to port before chemotherapy. 30 g 0  . lisinopril (PRINIVIL,ZESTRIL) 20 MG tablet Take 20 mg by mouth 2 (two) times daily.    [name] omega-3 acid ethyl esters (LOVAZA) 1 g capsule Take 1 capsule (1 g total) by mouth daily. 90 capsule 3  . pravastatin (PRAVACHOL) 20 MG tablet Take 1 tablet (20 mg total) by mouth daily. 90 tablet 3  . prochlorperazine (COMPAZINE) 10 MG tablet Take 1 tablet (10 mg total)  by mouth every 6 (six) hours as needed for nausea or vomiting. 30 tablet 0  . senna-docusate (SENOKOT-S) 8.6-50 MG tablet Take 1 tablet by mouth 2 (two) times daily. As needed to prevent constipation while taking strong pain meds. 30 tablet 0  . traMADol (ULTRAM) 50 MG tablet Take 1-2 tablets (50-100 mg total) by mouth every 6 (six) hours as needed for moderate pain or severe pain. Post-operatively 20 tablet 00  . sitaGLIPtin-metformin (JANUMET) 50-1000 MG tablet Take 1 tablet by mouth 2 (two) times daily with a meal. (Patient not taking: Reported on 03/02/2017) 180 tablet 3   No facility-administered medications prior to visit.     No Known Allergies  ROS As per HPI  PE: Blood pressure 126/73, pulse (!) 56, temperature 98.1 F (36.7 C), temperature source Oral, resp. rate 16, height 5\' 8"  (1.727 m), weight 203 lb 12 oz (92.4 kg), SpO2 99 %. Gen: Alert, well appearing.  Patient is oriented to person, place, time, and situation. AFFECT: pleasant, lucid thought and speech. EARS: 100% cerumen impaction bilaterally. L ear irrigated 100% clear of cerumen; TM appeared normal. R EAC still 100% occluded after irrigation.  Area of abrasion/tiny area of BRP on canal wall after irrigation.  LABS:    Chemistry      Component Value Date/Time   NA 141 02/25/2017 1143   K 4.2 02/25/2017 1143   CL 101 10/20/2016 0349   CO2 25 02/25/2017 1143   BUN 23.8 02/25/2017 1143   CREATININE 1.1 02/25/2017 1143      Component Value Date/Time   CALCIUM 9.8 02/25/2017 1143   ALKPHOS 64 02/25/2017 1143   AST 14 02/25/2017 1143   ALT 12 02/25/2017 1143   BILITOT 0.54 02/25/2017 1143       IMPRESSION AND PLAN:  1) Hearing impairment bilat: question of chemo-induced hearing loss AU. Referral ordered to ENT so he can be tested and see if anything can be done to help him.  2) Cerumen impaction AU: cleared on L, still 100% impaction on R.  I am sure this is contributing to his asymmetric feeling of  hearing loss.  Will refer to ENT for this problem.  3) At end of visit today, pt asked if he could be changed back to metformin from janumet due to cost. Has been off janumet x 3 weeks, states recent gluc 116. I agreed to d/c janumet and change back to metformin 1000mg  bid.  An After Visit Summary was printed and given to the patient.  FOLLOW UP: Return if symptoms worsen or fail to improve.  Signed:  Crissie Sickles, MD           03/02/2017

## 2017-03-03 ENCOUNTER — Telehealth: Payer: Self-pay | Admitting: Oncology

## 2017-03-03 NOTE — Telephone Encounter (Signed)
Spoke to patients wife regarding appointment 12/6.

## 2017-03-30 DIAGNOSIS — C672 Malignant neoplasm of lateral wall of bladder: Secondary | ICD-10-CM | POA: Diagnosis not present

## 2017-03-31 ENCOUNTER — Ambulatory Visit (INDEPENDENT_AMBULATORY_CARE_PROVIDER_SITE_OTHER): Payer: Medicare Other

## 2017-03-31 ENCOUNTER — Other Ambulatory Visit: Payer: Self-pay | Admitting: Urology

## 2017-03-31 DIAGNOSIS — C672 Malignant neoplasm of lateral wall of bladder: Secondary | ICD-10-CM

## 2017-03-31 DIAGNOSIS — C679 Malignant neoplasm of bladder, unspecified: Secondary | ICD-10-CM | POA: Diagnosis not present

## 2017-04-02 DIAGNOSIS — Z77122 Contact with and (suspected) exposure to noise: Secondary | ICD-10-CM | POA: Diagnosis not present

## 2017-04-02 DIAGNOSIS — Z8551 Personal history of malignant neoplasm of bladder: Secondary | ICD-10-CM | POA: Diagnosis not present

## 2017-04-02 DIAGNOSIS — H6121 Impacted cerumen, right ear: Secondary | ICD-10-CM | POA: Diagnosis not present

## 2017-04-02 DIAGNOSIS — Z87891 Personal history of nicotine dependence: Secondary | ICD-10-CM | POA: Diagnosis not present

## 2017-04-02 DIAGNOSIS — H9193 Unspecified hearing loss, bilateral: Secondary | ICD-10-CM | POA: Diagnosis not present

## 2017-04-02 DIAGNOSIS — Z7289 Other problems related to lifestyle: Secondary | ICD-10-CM | POA: Diagnosis not present

## 2017-04-06 DIAGNOSIS — C672 Malignant neoplasm of lateral wall of bladder: Secondary | ICD-10-CM | POA: Diagnosis not present

## 2017-04-06 DIAGNOSIS — K8689 Other specified diseases of pancreas: Secondary | ICD-10-CM | POA: Diagnosis not present

## 2017-04-14 DIAGNOSIS — K429 Umbilical hernia without obstruction or gangrene: Secondary | ICD-10-CM | POA: Diagnosis not present

## 2017-04-14 DIAGNOSIS — Z936 Other artificial openings of urinary tract status: Secondary | ICD-10-CM | POA: Diagnosis not present

## 2017-04-14 DIAGNOSIS — C672 Malignant neoplasm of lateral wall of bladder: Secondary | ICD-10-CM | POA: Diagnosis not present

## 2017-04-14 DIAGNOSIS — N281 Cyst of kidney, acquired: Secondary | ICD-10-CM | POA: Diagnosis not present

## 2017-04-22 ENCOUNTER — Other Ambulatory Visit: Payer: Self-pay | Admitting: Family Medicine

## 2017-04-23 ENCOUNTER — Other Ambulatory Visit: Payer: Self-pay | Admitting: Family Medicine

## 2017-04-27 DIAGNOSIS — H903 Sensorineural hearing loss, bilateral: Secondary | ICD-10-CM | POA: Diagnosis not present

## 2017-05-01 ENCOUNTER — Encounter: Payer: Self-pay | Admitting: Family Medicine

## 2017-06-16 ENCOUNTER — Other Ambulatory Visit: Payer: Medicare Other

## 2017-06-16 ENCOUNTER — Ambulatory Visit: Payer: Medicare Other | Admitting: Oncology

## 2017-06-18 ENCOUNTER — Telehealth: Payer: Self-pay | Admitting: Oncology

## 2017-06-18 ENCOUNTER — Ambulatory Visit (HOSPITAL_BASED_OUTPATIENT_CLINIC_OR_DEPARTMENT_OTHER): Payer: Medicare Other | Admitting: Oncology

## 2017-06-18 ENCOUNTER — Other Ambulatory Visit (HOSPITAL_BASED_OUTPATIENT_CLINIC_OR_DEPARTMENT_OTHER): Payer: Medicare Other

## 2017-06-18 VITALS — BP 123/58 | HR 56 | Temp 97.9°F | Resp 18 | Ht 68.0 in | Wt 202.5 lb

## 2017-06-18 DIAGNOSIS — C675 Malignant neoplasm of bladder neck: Secondary | ICD-10-CM

## 2017-06-18 DIAGNOSIS — C679 Malignant neoplasm of bladder, unspecified: Secondary | ICD-10-CM

## 2017-06-18 LAB — CBC WITH DIFFERENTIAL/PLATELET
BASO%: 0.8 % (ref 0.0–2.0)
Basophils Absolute: 0.1 10*3/uL (ref 0.0–0.1)
EOS%: 1.7 % (ref 0.0–7.0)
Eosinophils Absolute: 0.1 10*3/uL (ref 0.0–0.5)
HCT: 39.7 % (ref 38.4–49.9)
HGB: 13.2 g/dL (ref 13.0–17.1)
LYMPH%: 35.9 % (ref 14.0–49.0)
MCH: 30.5 pg (ref 27.2–33.4)
MCHC: 33.3 g/dL (ref 32.0–36.0)
MCV: 91.7 fL (ref 79.3–98.0)
MONO#: 0.4 10*3/uL (ref 0.1–0.9)
MONO%: 5.9 % (ref 0.0–14.0)
NEUT#: 3.8 10*3/uL (ref 1.5–6.5)
NEUT%: 55.7 % (ref 39.0–75.0)
Platelets: 241 10*3/uL (ref 140–400)
RBC: 4.33 10*6/uL (ref 4.20–5.82)
RDW: 13.2 % (ref 11.0–14.6)
WBC: 6.8 10*3/uL (ref 4.0–10.3)
lymph#: 2.5 10*3/uL (ref 0.9–3.3)

## 2017-06-18 LAB — COMPREHENSIVE METABOLIC PANEL
ALT: 13 U/L (ref 0–55)
AST: 12 U/L (ref 5–34)
Albumin: 3.6 g/dL (ref 3.5–5.0)
Alkaline Phosphatase: 67 U/L (ref 40–150)
Anion Gap: 12 mEq/L — ABNORMAL HIGH (ref 3–11)
BUN: 26.5 mg/dL — ABNORMAL HIGH (ref 7.0–26.0)
CO2: 21 mEq/L — ABNORMAL LOW (ref 22–29)
Calcium: 9.3 mg/dL (ref 8.4–10.4)
Chloride: 107 mEq/L (ref 98–109)
Creatinine: 1.2 mg/dL (ref 0.7–1.3)
EGFR: 55 mL/min/{1.73_m2} — ABNORMAL LOW (ref >60)
Glucose: 193 mg/dl — ABNORMAL HIGH (ref 70–140)
Potassium: 4.5 mEq/L (ref 3.5–5.1)
Sodium: 139 mEq/L (ref 136–145)
Total Bilirubin: 0.4 mg/dL (ref 0.20–1.20)
Total Protein: 7.1 g/dL (ref 6.4–8.3)

## 2017-06-18 NOTE — Telephone Encounter (Signed)
Scheduled appt per 12/6 los - Gave patient aVS and calender per los. Lab and f/u in 6 months.

## 2017-06-18 NOTE — Progress Notes (Signed)
Hematology and Oncology Follow Up Visit  Matthias Bogus [id number] 02-May-1936 81 y.o. 06/18/2017 11:53 AM McGowen, [name], MDMcGowen, [name], MD   Principle Diagnosis: 81 year old gentleman with invasive high-grade urothelial carcinoma of the bladder diagnosed in September 2017. He has T2 N0 disease.   Prior Therapy:  He is status post TURBT on 04/02/2016 which confirmed the tumor invades into the muscularis propria. He has no evidence of metastatic disease based on his abdominal imaging as well as chest x-ray. This was completed on 04/02/2016.  Neoadjuvant chemotherapy utilizing gemcitabine and cisplatin day 1 of cycle 1 of therapy was on 05/16/2016. He is status post 3 cycles of therapy completed in December 2017.  He is status post robotic-assisted laparoscopic radical cystoprostatectomy and bilateral pelvic lymphadenectomy completed by Dr. Tresa Moore on 10/17/2016. Final pathology revealed no residual tumor.   Current therapy: Observation and surveillance.   Interim History: Mr. Deroy presents today for a follow-up visit. Since the last visit, he reports doing well and nearly recovered back to baseline function.  He did have one episode where he became lightheaded related to low blood pressure which has resolved.  He does not report any abdominal pain or hematuria.  He does not report any flank pain or decline in his performance status.  He does report some mild arthritic pain but otherwise no other complaints.  He is getting adjusted to his urostomy bag.  He reports he changes every 2 hours.  He does not report any headaches, blurry vision, syncope or seizures. He does not report any fevers or chills or sweats. He does not report any cough, wheezing or hemoptysis. He does not report any nausea, vomiting or abdominal pain. He does not report any constipation, diarrhea or change in his bowel habits. He does not report any frequency, urgency or hesitancy. He does not report any skeletal  complaints of arthralgias or myalgias. The remaining review of systems unremarkable.    Medications: I have reviewed the patient's current medications.  Current Outpatient Medications  Medication Sig Dispense Refill  . clotrimazole (LOTRIMIN) 1 % cream Apply 1 application topically daily as needed (fungus).    [name] lidocaine-prilocaine (EMLA) cream Apply 1 application topically as needed. Apply to port before chemotherapy. 30 g 0  . lisinopril (PRINIVIL,ZESTRIL) 20 MG tablet Take 20 mg by mouth 2 (two) times daily.    . metFORMIN (GLUCOPHAGE) 1000 MG tablet Take 1 tablet (1,000 mg total) by mouth 2 (two) times daily with a meal. 60 tablet 6  . omega-3 acid ethyl esters (LOVAZA) 1 g capsule TAKE 1 CAPSULE (1 G TOTAL) BY MOUTH DAILY. 90 capsule 1  . pravastatin (PRAVACHOL) 20 MG tablet TAKE 1 TABLET (20 MG TOTAL) BY MOUTH DAILY. 90 tablet 1  . prochlorperazine (COMPAZINE) 10 MG tablet Take 1 tablet (10 mg total) by mouth every 6 (six) hours as needed for nausea or vomiting. 30 tablet 0  . senna-docusate (SENOKOT-S) 8.6-50 MG tablet Take 1 tablet by mouth 2 (two) times daily. As needed to prevent constipation while taking strong pain meds. 30 tablet 0  . traMADol (ULTRAM) 50 MG tablet Take 1-2 tablets (50-100 mg total) by mouth every 6 (six) hours as needed for moderate pain or severe pain. Post-operatively 20 tablet 00   No current facility-administered medications for this visit.      Allergies: No Known Allergies  Past Medical History, Surgical history, Social history, and Family History were reviewed and updated.   Physical Exam: Blood pressure (!) 123/58,  pulse (!) 56, temperature 97.9 F (36.6 C), temperature source Oral, resp. rate 18, height 5\' 8"  (1.727 m), weight 202 lb 8 oz (91.9 kg), SpO2 100 %. ECOG: 1 General appearance: Alert, awake gentleman without distress. Head: Normocephalic, without obvious abnormality no oral ulcers or lesions. Neck: no adenopathy or masses. Lymph  nodes: Cervical, supraclavicular, and axillary nodes normal. Heart:regular rate and rhythm, S1, S2 normal, no murmur, click, rub or gallop Lung:chest clear, no wheezing, rales, normal symmetric air entry Port-A-Cath site appear intact. Abdomin: soft, non-tender, without masses or organomegaly no rebound or guarding. EXT:no erythema, induration, or nodules   Lab Results: Lab Results  Component Value Date   WBC 6.8 06/18/2017   HGB 13.2 06/18/2017   HCT 39.7 06/18/2017   MCV 91.7 06/18/2017   PLT 241 06/18/2017     Chemistry      Component Value Date/Time   NA 141 02/25/2017 1143   K 4.2 02/25/2017 1143   CL 101 10/20/2016 0349   CO2 25 02/25/2017 1143   BUN 23.8 02/25/2017 1143   CREATININE 1.1 02/25/2017 1143      Component Value Date/Time   CALCIUM 9.8 02/25/2017 1143   ALKPHOS 64 02/25/2017 1143   AST 14 02/25/2017 1143   ALT 12 02/25/2017 1143   BILITOT 0.54 02/25/2017 6829        81 year old gentleman with the following issues:  1. Invasive high-grade urothelial carcinoma of the bladder after presenting with hematuria and difficulty urination in September 2017. He is status post TURBT on 04/02/2016 which confirmed the tumor invades into the muscularis propria. He has no evidence of metastatic disease based on his abdominal imaging as well as chest x-ray.  He is status post 3 cycles of chemotherapy utilizing gemcitabine and cisplatin that was well-tolerated.  CT scan chest abdomen and pelvis done on 07/23/2016 showed no evidence of advanced disease with decrease in the size of his bladder tumor.  He is status post cystoprostatectomy and lymphadenectomy in April 2018. No residual malignancy noted at the time.  CT scan obtained in September 2018 showed no evidence of recurrent disease.  The plan is to continue with active surveillance at this time and he will continue to have repeat imaging studies performed by Dr. Tresa Moore.   2. IV access: Port-A-Cath will be  removed at this time given the fact he has no evidence to suggest recurrent disease.  This was discussed with him today and is agreeable.  3. Follow-up: Will be in 6 months.  Zola Button, MD 12/6/201811:53 AM

## 2017-06-24 ENCOUNTER — Encounter: Payer: Self-pay | Admitting: Family Medicine

## 2017-07-02 ENCOUNTER — Other Ambulatory Visit: Payer: Self-pay | Admitting: Radiology

## 2017-07-03 ENCOUNTER — Encounter (HOSPITAL_COMMUNITY): Payer: Self-pay

## 2017-07-03 ENCOUNTER — Ambulatory Visit (HOSPITAL_COMMUNITY)
Admission: RE | Admit: 2017-07-03 | Discharge: 2017-07-03 | Disposition: A | Discharge status: Home / Self Care | Payer: Medicare Other | Source: Ambulatory Visit | Attending: Oncology | Admitting: Oncology

## 2017-07-03 DIAGNOSIS — Z9221 Personal history of antineoplastic chemotherapy: Secondary | ICD-10-CM | POA: Diagnosis not present

## 2017-07-03 DIAGNOSIS — I2510 Atherosclerotic heart disease of native coronary artery without angina pectoris: Secondary | ICD-10-CM | POA: Diagnosis not present

## 2017-07-03 DIAGNOSIS — E119 Type 2 diabetes mellitus without complications: Secondary | ICD-10-CM | POA: Diagnosis not present

## 2017-07-03 DIAGNOSIS — E785 Hyperlipidemia, unspecified: Secondary | ICD-10-CM | POA: Insufficient documentation

## 2017-07-03 DIAGNOSIS — Z8551 Personal history of malignant neoplasm of bladder: Secondary | ICD-10-CM | POA: Diagnosis not present

## 2017-07-03 DIAGNOSIS — Z452 Encounter for adjustment and management of vascular access device: Secondary | ICD-10-CM | POA: Insufficient documentation

## 2017-07-03 DIAGNOSIS — Z7984 Long term (current) use of oral hypoglycemic drugs: Secondary | ICD-10-CM | POA: Diagnosis not present

## 2017-07-03 DIAGNOSIS — Z79899 Other long term (current) drug therapy: Secondary | ICD-10-CM | POA: Insufficient documentation

## 2017-07-03 DIAGNOSIS — I10 Essential (primary) hypertension: Secondary | ICD-10-CM | POA: Insufficient documentation

## 2017-07-03 DIAGNOSIS — C679 Malignant neoplasm of bladder, unspecified: Secondary | ICD-10-CM

## 2017-07-03 HISTORY — DX: Personal history of antineoplastic chemotherapy: Z92.21

## 2017-07-03 HISTORY — PX: IR REMOVAL TUN ACCESS W/ PORT W/O FL MOD SED: IMG2290

## 2017-07-03 LAB — BASIC METABOLIC PANEL
Anion gap: 12 (ref 5–15)
BUN: 41 mg/dL — ABNORMAL HIGH (ref 6–20)
CO2: 20 mmol/L — ABNORMAL LOW (ref 22–32)
Calcium: 8.9 mg/dL (ref 8.9–10.3)
Chloride: 102 mmol/L (ref 101–111)
Creatinine, Ser: 1.4 mg/dL — ABNORMAL HIGH (ref 0.61–1.24)
GFR calc Af Amer: 54 mL/min — ABNORMAL LOW (ref >60)
GFR calc non Af Amer: 46 mL/min — ABNORMAL LOW (ref >60)
Glucose, Bld: 176 mg/dL — ABNORMAL HIGH (ref 65–99)
Potassium: 4 mmol/L (ref 3.5–5.1)
Sodium: 134 mmol/L — ABNORMAL LOW (ref 135–145)

## 2017-07-03 LAB — CBC WITH DIFFERENTIAL/PLATELET
Basophils Absolute: 0 10*3/uL (ref 0.0–0.1)
Basophils Relative: 0 %
Eosinophils Absolute: 0 10*3/uL (ref 0.0–0.7)
Eosinophils Relative: 0 %
HCT: 37.8 % — ABNORMAL LOW (ref 39.0–52.0)
Hemoglobin: 13.3 g/dL (ref 13.0–17.0)
Lymphocytes Relative: 24 %
Lymphs Abs: 2 10*3/uL (ref 0.7–4.0)
MCH: 31.1 pg (ref 26.0–34.0)
MCHC: 35.2 g/dL (ref 30.0–36.0)
MCV: 88.3 fL (ref 78.0–100.0)
Monocytes Absolute: 0.8 10*3/uL (ref 0.1–1.0)
Monocytes Relative: 10 %
Neutro Abs: 5.6 10*3/uL (ref 1.7–7.7)
Neutrophils Relative %: 66 %
Platelets: 238 10*3/uL (ref 150–400)
RBC: 4.28 MIL/uL (ref 4.22–5.81)
RDW: 12.6 % (ref 11.5–15.5)
WBC: 8.5 10*3/uL (ref 4.0–10.5)

## 2017-07-03 LAB — GLUCOSE, CAPILLARY: Glucose-Capillary: 180 mg/dL — ABNORMAL HIGH (ref 65–99)

## 2017-07-03 LAB — PROTIME-INR
INR: 1.03
Prothrombin Time: 13.4 seconds (ref 11.4–15.2)

## 2017-07-03 MED ORDER — FENTANYL CITRATE (PF) 100 MCG/2ML IJ SOLN
INTRAMUSCULAR | Status: AC
  Filled 2017-07-03: qty 4

## 2017-07-03 MED ORDER — SODIUM CHLORIDE 0.9 % IV SOLN
INTRAVENOUS | Status: DC
  Administered 2017-07-03: 10:00:00 via INTRAVENOUS

## 2017-07-03 MED ORDER — FENTANYL CITRATE (PF) 100 MCG/2ML IJ SOLN
INTRAMUSCULAR | Status: AC | PRN
  Administered 2017-07-03: 50 ug via INTRAVENOUS

## 2017-07-03 MED ORDER — CEFAZOLIN SODIUM-DEXTROSE 2-4 GM/100ML-% IV SOLN
INTRAVENOUS | Status: AC
  Filled 2017-07-03: qty 100

## 2017-07-03 MED ORDER — MIDAZOLAM HCL 2 MG/2ML IJ SOLN
INTRAMUSCULAR | Status: AC
  Filled 2017-07-03: qty 4

## 2017-07-03 MED ORDER — FLUMAZENIL 0.5 MG/5ML IV SOLN
INTRAVENOUS | Status: DC
Start: 2017-07-03 — End: 2017-07-03
  Filled 2017-07-03: qty 5

## 2017-07-03 MED ORDER — CEFAZOLIN SODIUM-DEXTROSE 2-4 GM/100ML-% IV SOLN
2.0000 g | INTRAVENOUS | Status: AC
  Administered 2017-07-03: 2 g via INTRAVENOUS

## 2017-07-03 MED ORDER — NALOXONE HCL 0.4 MG/ML IJ SOLN
INTRAMUSCULAR | Status: AC
  Filled 2017-07-03: qty 1

## 2017-07-03 MED ORDER — LIDOCAINE HCL 1 % IJ SOLN
INTRAMUSCULAR | Status: AC
  Filled 2017-07-03: qty 20

## 2017-07-03 MED ORDER — MIDAZOLAM HCL 2 MG/2ML IJ SOLN
INTRAMUSCULAR | Status: AC | PRN
  Administered 2017-07-03: 1 mg via INTRAVENOUS

## 2017-07-03 MED ORDER — LIDOCAINE HCL 1 % IJ SOLN
INTRAMUSCULAR | Status: AC | PRN
  Administered 2017-07-03 (×2): 10 mL

## 2017-07-03 NOTE — Procedures (Signed)
Interventional Radiology Procedure Note  Procedure: Port removal  Complications: None  Estimated Blood Loss: < 10 mL  Findings: Right chest port removed in entirety. Wound closed.  Dionis Autry T. Adrie Picking, M.D Pager:  [phone]    

## 2017-07-03 NOTE — Sedation Documentation (Signed)
Patient denies pain and is resting comfortably.  

## 2017-07-03 NOTE — Discharge Instructions (Signed)
Implanted Port Removal, Care After °Refer to this sheet in the next few weeks. These instructions provide you with information about caring for yourself after your procedure. Your health care provider may also give you more specific instructions. Your treatment has been planned according to current medical practices, but problems sometimes occur. Call your health care provider if you have any problems or questions after your procedure. °What can I expect after the procedure? °After the procedure, it is common to have: °· Soreness or pain near your incision. °· Some swelling or bruising near your incision. ° °Follow these instructions at home: °Medicines °· Take over-the-counter and prescription medicines only as told by your health care provider. °· If you were prescribed an antibiotic medicine, take it as told by your health care provider. Do not stop taking the antibiotic even if you start to feel better. °Bathing °· Do not take baths, swim, or use a hot tub until your health care provider approves. Ask your health care provider if you can take showers. You may only be allowed to take sponge baths for bathing. °Incision care °· Follow instructions from your health care provider about how to take care of your incision. Make sure you: °? Wash your hands with soap and water before you change your bandage (dressing). If soap and water are not available, use hand sanitizer. °? Change your dressing as told by your health care provider. °? Keep your dressing dry. °? Leave stitches (sutures), skin glue, or adhesive strips in place. These skin closures may need to stay in place for 2 weeks or longer. If adhesive strip edges start to loosen and curl up, you may trim the loose edges. Do not remove adhesive strips completely unless your health care provider tells you to do that. °· Check your incision area every day for signs of infection. Check for: °? More redness, swelling, or pain. °? More fluid or  blood. °? Warmth. °? Pus or a bad smell. °Driving °· If you received a sedative, do not drive for 24 hours after the procedure. °· If you did not receive a sedative, ask your health care provider when it is safe to drive. °Activity °· Return to your normal activities as told by your health care provider. Ask your health care provider what activities are safe for you. °· Until your health care provider says it is safe: °? Do not lift anything that is heavier than 10 lb (4.5 kg). °? Do not do activities that involve lifting your arms over your head. °General instructions °· Do not use any tobacco products, such as cigarettes, chewing tobacco, and e-cigarettes. Tobacco can delay healing. If you need help quitting, ask your health care provider. °· Keep all follow-up visits as told by your health care provider. This is important. °Contact a health care provider if: °· You have more redness, swelling, or pain around your incision. °· You have more fluid or blood coming from your incision. °· Your incision feels warm to the touch. °· You have pus or a bad smell coming from your incision. °· You have a fever. °· You have pain that is not relieved by your pain medicine. °Get help right away if: °· You have chest pain. °· You have difficulty breathing. °This information is not intended to replace advice given to you by your health care provider. Make sure you discuss any questions you have with your health care provider. °Document Released: 06/11/2015 Document Revised: 12/06/2015 Document Reviewed: 04/04/2015 °Elsevier Interactive Patient   Education © 2018 [employer] °Moderate Conscious Sedation, Adult, Care After °These instructions provide you with information about caring for yourself after your procedure. Your health care provider may also give you more specific instructions. Your treatment has been planned according to current medical practices, but problems sometimes occur. Call your health care provider if you have  any problems or questions after your procedure. °What can I expect after the procedure? °After your procedure, it is common: °· To feel sleepy for several hours. °· To feel clumsy and have poor balance for several hours. °· To have poor judgment for several hours. °· To vomit if you eat too soon. ° °Follow these instructions at home: °For at least 24 hours after the procedure: ° °· Do not: °? Participate in activities where you could fall or become injured. °? Drive. °? Use heavy machinery. °? Drink alcohol. °? Take sleeping pills or medicines that cause drowsiness. °? Make important decisions or sign legal documents. °? Take care of children on your own. °· Rest. °Eating and drinking °· Follow the diet recommended by your health care provider. °· If you vomit: °? Drink water, juice, or soup when you can drink without vomiting. °? Make sure you have little or no nausea before eating solid foods. °General instructions °· Have a responsible adult stay with you until you are awake and alert. °· Take over-the-counter and prescription medicines only as told by your health care provider. °· If you smoke, do not smoke without supervision. °· Keep all follow-up visits as told by your health care provider. This is important. °Contact a health care provider if: °· You keep feeling nauseous or you keep vomiting. °· You feel light-headed. °· You develop a rash. °· You have a fever. °Get help right away if: °· You have trouble breathing. °This information is not intended to replace advice given to you by your health care provider. Make sure you discuss any questions you have with your health care provider. °Document Released: 04/20/2013 Document Revised: 12/03/2015 Document Reviewed: 10/20/2015 °Elsevier Interactive Patient Education © 2018 [employer] ° °

## 2017-07-03 NOTE — H&P (Signed)
Referring Physician(s): Wyatt Portela  Supervising Physician: Aletta Edouard  Patient Status:  [name]  Chief Complaint:  "I'm getting my port out"  Subjective: Patient familiar to IR service from prior Port-A-Cath placement on 05/12/16.  He has a history of bladder cancer, status post surgery and chemotherapy.  He has no recurrent disease on recent imaging and presents today for Port-A-Cath removal.  He currently denies fever, headache, chest pain, dyspnea, cough, abdominal/back pain, nausea, vomiting or bleeding.  Past Medical History:  Diagnosis Date  . Abnormal EKG 2014   This led to stress test which was abnl, which led to cardiology referral: cath showed small vessel dz of 2nd diag branch with 70-80% stenosis--preserved LV function  . Bilateral renal cysts    Non-complex (CT 03/2016): no enhancement, coarse calcifications, mass effect does give false appearance of hydro on non-contract series (followed by urol).  . Bladder cancer ([place]) 03/2016   Invasive high grade urothelial carcinoma.  No mets at dx.  Cystoscopy + bx and partial resection of tumor in hosp when admitted for gross hematuria and AUR.  Neoadj chemo 06/2016, then got cystoprostatectomy. (Urostomy--ileal conduit urinary diversion with refluxing anastamoses--has RLQ urostomy)-gets routine surveillance by urol (Dr. Tresa Moore), most recent 04/2017--no sign of recurrence--obs status  . CAD (coronary artery disease)    small vessel dz x 1 vessel on cath 2014: med mgmt  . Chronic calcific pancreatitis ([place]) 03/2016   CT: also small cyst in head of pancreas that needs 6 mo f/u imaging (09/2016)  . Diabetes mellitus without complication ([place])   . Heart murmur, systolic    Noted in prior PCP's records.  I recommended echocardiogram 07/2016 but pt declined.  [name] History of Bell's palsy   . History of chemotherapy   . Hyperlipidemia   . Hypertension   . Pancreas cyst 03/2016   Repeat imaging with MRI pancreas protocol 09/2016    . Umbilical hernia    Repaired when he got his bladder surgery.   Past Surgical History:  Procedure Laterality Date  . CARDIAC CATHETERIZATION  02/09/2013   small vessel dz of 2nd diag branch with 70-80% stenosis--preserved LV function. Medical therapy rec'd. Agustin Cree, PA)  . CARDIOVASCULAR STRESS TEST  01/26/2013   No ischemia.  Wall motion abnormalities at inferior base suggesting small area of infarction.  EF 64%.  [name] CATARACT EXTRACTION    . cystoprostatectomy w/LN surgery  10/17/2016   Ileal conduit: this is an incontinent urine diversion that directs urine from the ureters through a segment of isolated bowel to the surface of the abdominal wall via a cutaneous stoma, and urine drains continuously into an external ostomy appliance.  . CYSTOSCOPY WITH INJECTION N/A 10/17/2016   Procedure: CYSTOSCOPY WITH INJECTION OF INDOCYANINE GREEN DYE;  Surgeon: Alexis Frock, MD;  Location: [facility];  Service: Urology;  Laterality: N/A;  . EYE SURGERY     cataract surgery bilat   . IR GENERIC HISTORICAL  05/12/2016   IR US GUIDE VASC ACCESS RIGHT 05/12/2016 Greggory Keen, MD [facility]  . IR GENERIC HISTORICAL  05/12/2016   IR FLUORO GUIDE PORT INSERTION RIGHT 05/12/2016 Greggory Keen, MD [facility]  . OSTOMY     urostomy  . TONSILLECTOMY AND ADENOIDECTOMY  Remote  . TRANSURETHRAL RESECTION OF PROSTATE N/A 04/02/2016   Procedure: CYSTOSCOPY, TRANSURETHRAL RESECTION OF BLADDER TUMOR;  Surgeon: [name], MD;  Location: [facility];  Service: Urology;  Laterality: N/A;  . UMBILICAL HERNIA REPAIR  10/2016  . VASECTOMY  Allergies: Other  Medications: Prior to Admission medications   Medication Sig Start Date End Date Taking? Authorizing Provider  lisinopril (PRINIVIL,ZESTRIL) 20 MG tablet Take 20 mg by mouth 2 (two) times daily.   Yes [provider]  polyethylene glycol (MIRALAX / GLYCOLAX) packet Take 17 g by mouth daily.   Yes [provider]  pravastatin  (PRAVACHOL) 20 MG tablet TAKE 1 TABLET (20 MG TOTAL) BY MOUTH DAILY. 04/24/17  Yes McGowen, [name], MD  clotrimazole (LOTRIMIN) 1 % cream Apply 1 application topically daily as needed (fungus).    [provider]  lidocaine-prilocaine (EMLA) cream Apply 1 application topically as needed. Apply to port before chemotherapy. 05/01/16   Wyatt Portela, MD  metFORMIN (GLUCOPHAGE) 1000 MG tablet Take 1 tablet (1,000 mg total) by mouth 2 (two) times daily with a meal. 03/02/17   McGowen, [name], MD  omega-3 acid ethyl esters (LOVAZA) 1 g capsule TAKE 1 CAPSULE (1 G TOTAL) BY MOUTH DAILY. 04/22/17   McGowen, [name], MD  prochlorperazine (COMPAZINE) 10 MG tablet Take 1 tablet (10 mg total) by mouth every 6 (six) hours as needed for nausea or vomiting. 05/01/16   Wyatt Portela, MD  senna-docusate (SENOKOT-S) 8.6-50 MG tablet Take 1 tablet by mouth 2 (two) times daily. As needed to prevent constipation while taking strong pain meds. 10/23/16   Alexis Frock, MD  traMADol (ULTRAM) 50 MG tablet Take 1-2 tablets (50-100 mg total) by mouth every 6 (six) hours as needed for moderate pain or severe pain. Post-operatively 10/23/16   Alexis Frock, MD     Vital Signs: BP (!) 104/56 (BP Location: Left Arm)   Pulse 75   Temp 99 F (37.2 C) (Oral)   Resp 18   SpO2 99%   Physical Exam awake, alert.  Chest clear to auscultation bilaterally.  Clean, intact right chest wall Port-A-Cath.  Heart with regular rate and rhythm, positive murmur.  Abdomen soft, positive bowel sounds, intact right lower quadrant urostomy with yellow urine in bag, trace lower extremity edema bilaterally. Imaging: No results found.  Labs:  CBC: Recent Labs    11/27/16 0847 02/25/17 1143 06/18/17 1125 07/03/17 0948  WBC 5.1 6.4 6.8 8.5  HGB 12.1* 13.9 13.2 13.3  HCT 36.2* 41.6 39.7 37.8*  PLT 257 181 241 238    COAGS: No results for input(s): INR, APTT in the last 8760 hours.  BMP: Recent Labs     10/18/16 0526 10/19/16 0407 10/20/16 0349 11/27/16 0847 02/25/17 1143 06/18/17 1125 07/03/17 0948  NA 138 135 135 137 141 139 134*  K 4.0 3.9 4.1 4.5 4.2 4.5 4.0  CL 107 106 101  --   --   --  102  CO2 24 24 25 22 25  21* 20*  GLUCOSE 126* 114* 125* 112 116 193* 176*  BUN 17 12 13  38.4* 23.8 26.5* 41*  CALCIUM 8.2* 8.2* 8.2* 9.7 9.8 9.3 8.9  CREATININE 1.14 0.94 0.91 1.1 1.1 1.2 1.40*  GFRNONAA 59* >60 >60  --   --   --  46*  GFRAA >60 >60 >60  --   --   --  54*    LIVER FUNCTION TESTS: Recent Labs    10/16/16 1710 11/27/16 0847 02/25/17 1143 06/18/17 1125  BILITOT 0.9 0.44 0.54 0.40  AST 18 19 14 12   ALT 12* 23 12 13   ALKPHOS 45 74 64 67  PROT 7.2 7.3 7.3 7.1  ALBUMIN 4.0 3.2* 3.8 3.6  Assessment and Plan: Pt with history of bladder cancer, status post surgery(cystoprostatectomy) and chemotherapy.  He has no recurrent disease on recent imaging and presents today for Port-A-Cath removal.  Details/risks of procedure, including but not limited to, internal bleeding, infection, injury to adjacent structures discussed with patient and spouse with their understanding and consent.   Electronically Signed: D. Rowe Robert, PA-C 07/03/2017, 11:10 AM   I spent a total of  20 minutes at the the patient's bedside AND on the patient's hospital floor or unit, greater than 50% of which was counseling/coordinating care for Port-A-Cath removal

## 2017-07-14 DIAGNOSIS — N182 Chronic kidney disease, stage 2 (mild): Secondary | ICD-10-CM

## 2017-07-14 HISTORY — DX: Chronic kidney disease, stage 2 (mild): N18.2

## 2017-07-15 DIAGNOSIS — H903 Sensorineural hearing loss, bilateral: Secondary | ICD-10-CM | POA: Diagnosis not present

## 2017-07-28 ENCOUNTER — Other Ambulatory Visit: Payer: Self-pay | Admitting: Family Medicine

## 2017-07-28 NOTE — Telephone Encounter (Signed)
Needs office visit. LOV: 07/21/16  SW pt and he stated that he has enough Lisinopril for now and will call back to schedule apt when he is closer to being out.

## 2017-08-04 ENCOUNTER — Other Ambulatory Visit: Payer: Self-pay

## 2017-08-04 MED ORDER — LISINOPRIL 20 MG PO TABS: 20.0000 mg | ORAL_TABLET | Freq: Two times a day (BID) | ORAL | 0 refills | Status: DC

## 2017-08-04 NOTE — Telephone Encounter (Signed)
Message left informing patient of refill and to return call to schedule an appointment within the next month for further refills

## 2017-08-04 NOTE — Telephone Encounter (Signed)
Pt overdue for diabetes and high blood pressure office follow up. I'll rx 1 mo supply of this med, but he must have office f/u prior to any FURTHER RFs.-thx

## 2017-10-02 DIAGNOSIS — C672 Malignant neoplasm of lateral wall of bladder: Secondary | ICD-10-CM | POA: Diagnosis not present

## 2017-10-03 LAB — BASIC METABOLIC PANEL
BUN: 46 — AB (ref 4–21)
Creatinine: 1 (ref 0.6–1.3)
Glucose: 525
Potassium: 5 (ref 3.4–5.3)
Sodium: 133 — AB (ref 137–147)

## 2017-10-03 LAB — HEPATIC FUNCTION PANEL
ALT: 10 (ref 10–40)
AST: 9 — AB (ref 14–40)
Alkaline Phosphatase: 95 (ref 25–125)
Bilirubin, Total: 0.3

## 2017-10-03 LAB — LAB REPORT - SCANNED: CO2: 27

## 2017-10-07 ENCOUNTER — Encounter: Payer: Self-pay | Admitting: Family Medicine

## 2017-10-07 ENCOUNTER — Ambulatory Visit (INDEPENDENT_AMBULATORY_CARE_PROVIDER_SITE_OTHER): Payer: Medicare Other | Admitting: Family Medicine

## 2017-10-07 ENCOUNTER — Telehealth: Payer: Self-pay | Admitting: Family Medicine

## 2017-10-07 VITALS — BP 132/74 | HR 64 | Temp 97.5°F | Resp 16 | Ht 67.0 in | Wt 192.4 lb

## 2017-10-07 DIAGNOSIS — E119 Type 2 diabetes mellitus without complications: Secondary | ICD-10-CM

## 2017-10-07 DIAGNOSIS — I10 Essential (primary) hypertension: Secondary | ICD-10-CM

## 2017-10-07 DIAGNOSIS — R739 Hyperglycemia, unspecified: Secondary | ICD-10-CM

## 2017-10-07 LAB — GLUCOSE, POCT (MANUAL RESULT ENTRY): POC Glucose: 550 mg/dl — AB (ref 70–99)

## 2017-10-07 LAB — POCT GLYCOSYLATED HEMOGLOBIN (HGB A1C): Hemoglobin A1C: 12.7

## 2017-10-07 MED ORDER — PEN NEEDLES 32G X 4 MM MISC: 1.0000 | Freq: Every day | 11 refills | Status: DC

## 2017-10-07 MED ORDER — INSULIN GLARGINE 100 UNITS/ML SOLOSTAR PEN: PEN_INJECTOR | SUBCUTANEOUS | 11 refills | Status: DC

## 2017-10-07 NOTE — Telephone Encounter (Signed)
Pt has apt today with Dr. Anitra Lauth at 1:30pm (30 min). Family will be coming w/ pt.

## 2017-10-07 NOTE — Telephone Encounter (Signed)
Copied from [place]. Topic: Quick Communication - See Telephone Encounter >> Oct 07, 2017  8:19 AM Boyd Kerbs wrote: CRM for notification.  [name], daughter called wanting to speak to the doctor about some concerns going on.  Unstable, excessive thirst and tiredness.  Not take his medication the last 2 days lisinopril (PRINIVIL,ZESTRIL) 20 MG tablet and pravastatin (PRAVACHOL) 20 MG tablet.  He says these are what is causing him trouble.  There are questions she has about his health.    See Telephone encounter for: 10/07/17.

## 2017-10-07 NOTE — Telephone Encounter (Signed)
Noted  

## 2017-10-07 NOTE — Telephone Encounter (Signed)
FYI

## 2017-10-07 NOTE — Progress Notes (Signed)
OFFICE VISIT  10/12/2017   CC:  Chief Complaint  Patient presents with  . Follow-up    cancer? per pt   HPI:    Patient is a 82 y.o. Caucasian male who presents for f/u DM 2, HTN, HLD.  I last saw him for f/u of these problems 14 months ago. He is accompanied by his wife, who brings in labs from recent urologic f/u: normal CMET except glucose 525.  His BUN was mildly elevated at 46, Na was 133, and potassium was 5.0. sCr was 1.0, GFR 76 ml/min. She has a page full of concerns that I have read.    He describes gradual onset/worsening of generalized weakness, seemed to start after "back to back" colds during which time he was not able to get hardly any sleep.  Past 2 wks worse, sleeps all day long.  Denies pain, has stiffness in R hand due to chronic arthritis.   Increased thirst and polyuria for at least the last few weeks. He has stopped all meds except his statin.  He stopped his metformin over a year ago around the time of surgery. Says the metformin was making him sick, but at the same time he was on chemo treatment.   CT abd/pelv w/contrast 07/23/16: IMPRESSION: 1. Interval response to therapy. Mass along the left lateral wall of the urinary bladder has significantly decreased in the interval. Residual focal thickening along the left lateral wall of the bladder measures 8 mm. 2. There is a nonspecific subpleural nodule within the lateral right upper lobe with an mean diameter of 8 mm. Attention on follow-up imaging is advised. 3. Aortic atherosclerosis and coronary artery calcifications 4. Changes of chronic pancreatitis noted. 5. Lumbar spondylosis  Past Medical History:  Diagnosis Date  . Abnormal EKG 2014   This led to stress test which was abnl, which led to cardiology referral: cath showed small vessel dz of 2nd diag branch with 70-80% stenosis--preserved LV function  . Bilateral renal cysts    Non-complex (CT 03/2016): no enhancement, coarse calcifications, mass effect  does give false appearance of hydro on non-contract series (followed by urol).  . Bladder cancer ([place]) 03/2016   Invasive high grade urothelial carcinoma.  No mets at dx.  Cystoscopy + bx and partial resection of tumor in hosp when admitted for gross hematuria and AUR.  Neoadj chemo 06/2016, then got cystoprostatectomy. (Urostomy--ileal conduit urinary diversion with refluxing anastamoses--has RLQ urostomy)-gets routine surveillance by urol (Dr. Tresa Moore), most recent 04/2017--no sign of recurrence--obs status  . CAD (coronary artery disease)    small vessel dz x 1 vessel on cath 2014: med mgmt  . Chronic calcific pancreatitis ([place]) 03/2016   CT: also small cyst in head of pancreas that needs 6 mo f/u imaging (09/2016)  . Diabetes mellitus without complication ([place])   . Heart murmur, systolic    Noted in prior PCP's records.  I recommended echocardiogram 07/2016 but pt declined.  [name] History of Bell's palsy   . History of chemotherapy   . Hyperlipidemia   . Hypertension   . Pancreas cyst 03/2016   Repeat imaging with MRI pancreas protocol 09/2016  . Umbilical hernia    Repaired when he got his bladder surgery.    Past Surgical History:  Procedure Laterality Date  . CARDIAC CATHETERIZATION  02/09/2013   small vessel dz of 2nd diag branch with 70-80% stenosis--preserved LV function. Medical therapy rec'd. Agustin Cree, PA)  . CARDIOVASCULAR STRESS TEST  01/26/2013   No ischemia.  Wall motion abnormalities at inferior base suggesting small area of infarction.  EF 64%.  [name] CATARACT EXTRACTION    . cystoprostatectomy w/LN surgery  10/17/2016   Ileal conduit: this is an incontinent urine diversion that directs urine from the ureters through a segment of isolated bowel to the surface of the abdominal wall via a cutaneous stoma, and urine drains continuously into an external ostomy appliance.  . CYSTOSCOPY WITH INJECTION N/A 10/17/2016   Procedure: CYSTOSCOPY WITH INJECTION OF INDOCYANINE GREEN DYE;   Surgeon: Alexis Frock, MD;  Location: [facility];  Service: Urology;  Laterality: N/A;  . EYE SURGERY     cataract surgery bilat   . IR GENERIC HISTORICAL  05/12/2016   IR US GUIDE VASC ACCESS RIGHT 05/12/2016 Greggory Keen, MD [facility]  . IR GENERIC HISTORICAL  05/12/2016   IR FLUORO GUIDE PORT INSERTION RIGHT 05/12/2016 Greggory Keen, MD [facility]  . IR REMOVAL TUN ACCESS W/ PORT W/O FL MOD SED  07/03/2017  . OSTOMY     urostomy  . TONSILLECTOMY AND ADENOIDECTOMY  Remote  . TRANSURETHRAL RESECTION OF PROSTATE N/A 04/02/2016   Procedure: CYSTOSCOPY, TRANSURETHRAL RESECTION OF BLADDER TUMOR;  Surgeon: [name], MD;  Location: [facility];  Service: Urology;  Laterality: N/A;  . UMBILICAL HERNIA REPAIR  10/2016  . VASECTOMY      Outpatient Medications Prior to Visit  Medication Sig Dispense Refill  . pravastatin (PRAVACHOL) 20 MG tablet TAKE 1 TABLET (20 MG TOTAL) BY MOUTH DAILY. (Patient not taking: Reported on 10/12/2017) 90 tablet 1  . clotrimazole (LOTRIMIN) 1 % cream Apply 1 application topically daily as needed (fungus).    [name] lidocaine-prilocaine (EMLA) cream Apply 1 application topically as needed. Apply to port before chemotherapy. (Patient not taking: Reported on 10/07/2017) 30 g 0  . lisinopril (PRINIVIL,ZESTRIL) 20 MG tablet Take 1 tablet (20 mg total) by mouth 2 (two) times daily. (Patient not taking: Reported on 10/07/2017) 60 tablet 0  . metFORMIN (GLUCOPHAGE) 1000 MG tablet Take 1 tablet (1,000 mg total) by mouth 2 (two) times daily with a meal. (Patient not taking: Reported on 10/07/2017) 60 tablet 6  . omega-3 acid ethyl esters (LOVAZA) 1 g capsule TAKE 1 CAPSULE (1 G TOTAL) BY MOUTH DAILY. (Patient not taking: Reported on 10/07/2017) 90 capsule 1  . polyethylene glycol (MIRALAX / GLYCOLAX) packet Take 17 g by mouth daily.    . prochlorperazine (COMPAZINE) 10 MG tablet Take 1 tablet (10 mg total) by mouth every 6 (six) hours as needed for nausea or vomiting. (Patient not  taking: Reported on 10/07/2017) 30 tablet 0  . senna-docusate (SENOKOT-S) 8.6-50 MG tablet Take 1 tablet by mouth 2 (two) times daily. As needed to prevent constipation while taking strong pain meds. (Patient not taking: Reported on 10/07/2017) 30 tablet 0  . traMADol (ULTRAM) 50 MG tablet Take 1-2 tablets (50-100 mg total) by mouth every 6 (six) hours as needed for moderate pain or severe pain. Post-operatively (Patient not taking: Reported on 10/07/2017) 20 tablet 00   No facility-administered medications prior to visit.     Allergies  Allergen Reactions  . Other Swelling    Pt states had swelling of the eye when having eye surgery and the anesthetic placed in eye     ROS As per HPI  PE: Blood pressure 132/74, pulse 64, temperature (!) 97.5 F (36.4 C), temperature source Oral, resp. rate 16, height 5\' 7"  (1.702 m), weight 192 lb 6 oz (87.3 kg), SpO2 97 %.  Gen: Alert, well appearing.  Patient is oriented to person, place, time, and situation. ENT: Ears: EACs clear, normal epithelium.  TMs with good light reflex and landmarks bilaterally.  Eyes: no injection, icteris, swelling, or exudate.  EOMI, PERRLA. Nose: no drainage or turbinate edema/swelling.  No injection or focal lesion.  Mouth: lips without lesion/swelling.  Oral mucosa pink and moist.  Dentition intact and without obvious caries or gingival swelling.  Oropharynx without erythema, exudate, or swelling.  Neck - No masses or thyromegaly or limitation in range of motion CV: RRR, no m/r/g.   LUNGS: CTA bilat, nonlabored resps, good aeration in all lung fields. ABD: soft, NT/ND EXT: no clubbing, cyanosis, or edema.  Neuro: CN 2-12 intact bilaterally, strength 5/5 in proximal and distal upper extremities and lower extremities bilaterally.   No tremor.  FNF normal bilat.  No ataxia.  Upper extremity and lower extremity DTRs symmetric.  No pronator drift.   LABS:  No results found for: TSH Lab Results  Component Value Date   WBC  8.5 07/03/2017   HGB 13.3 07/03/2017   HCT 37.8 (L) 07/03/2017   MCV 88.3 07/03/2017   PLT 238 07/03/2017   Lab Results  Component Value Date   CREATININE 1.0 10/03/2017   BUN 46 (A) 10/03/2017   NA 133 (A) 10/03/2017   K 5.0 10/03/2017   CL 102 07/03/2017   CO2 20 (L) 07/03/2017   Lab Results  Component Value Date   ALT 10 10/03/2017   AST 9 (A) 10/03/2017   ALKPHOS 95 10/03/2017   BILITOT 0.40 06/18/2017   No results found for: CHOL No results found for: HDL No results found for: LDLCALC No results found for: TRIG No results found for: CHOLHDL No results found for: PSA Lab Results  Component Value Date   HGBA1C 12.7 10/07/2017  No results found for: TSH No results found for: VITAMINB12  POC glucose today is 550.  POC HbA1c= 12.7  IMPRESSION AND PLAN:  1) Severely uncontrolled DM, noncompliant with metformin over the last 1 yr. No sign of DKA today, reviewed labs from 10/03/17 and CO2 was 27. Start lantus insulin: will be conservative b/c he is insulin naive: 15 U qhs. Titration instructions given, monitor glucoses at home. Do not give any of his other meds until further notice. Check bp and HR 1-2 times per day and write them down.          Spent 40 min with pt today, with >50% of this time spent in counseling and care coordination regarding the above problems.  An After Visit Summary was printed and given to the patient.  FOLLOW UP: Return in about 5 days (around 10/12/2017).  Signed:  Crissie Sickles, MD           10/12/2017

## 2017-10-07 NOTE — Telephone Encounter (Signed)
Noted. thx 

## 2017-10-07 NOTE — Telephone Encounter (Signed)
Copied from [place]. Topic: General - Other >> Oct 07, 2017  8:39 AM Cecelia Byars, NT wrote: Reason for CRM: Patients daughter  called and said his urologist will fax over his labs from his visit on the 10/02/17

## 2017-10-07 NOTE — Patient Instructions (Signed)
Check glucose (fasting) every morning, check again 2 hours after mid-day meal, and again at bedtime. Write these number down.  Give 15 Units of lantus insulin at bedtime. Increase by 2 units per night until fasting glucose is in the range of 100-110. Once he gets to the 100-110 range then continue that current dose every night.  Do not give his other meds (pills) until further notice.  Check blood pressure and heart rate 1-2 times per day and write them down.

## 2017-10-09 ENCOUNTER — Ambulatory Visit (HOSPITAL_COMMUNITY)
Admission: RE | Admit: 2017-10-09 | Discharge: 2017-10-09 | Disposition: A | Discharge status: Home / Self Care | Payer: Medicare Other | Source: Ambulatory Visit | Attending: Urology | Admitting: Urology

## 2017-10-09 ENCOUNTER — Other Ambulatory Visit: Payer: Self-pay | Admitting: Urology

## 2017-10-09 DIAGNOSIS — C679 Malignant neoplasm of bladder, unspecified: Secondary | ICD-10-CM | POA: Diagnosis not present

## 2017-10-09 DIAGNOSIS — C672 Malignant neoplasm of lateral wall of bladder: Secondary | ICD-10-CM

## 2017-10-09 DIAGNOSIS — R7309 Other abnormal glucose: Secondary | ICD-10-CM | POA: Diagnosis not present

## 2017-10-09 DIAGNOSIS — R918 Other nonspecific abnormal finding of lung field: Secondary | ICD-10-CM

## 2017-10-09 DIAGNOSIS — N390 Urinary tract infection, site not specified: Secondary | ICD-10-CM | POA: Diagnosis not present

## 2017-10-09 DIAGNOSIS — I6381 Other cerebral infarction due to occlusion or stenosis of small artery: Secondary | ICD-10-CM | POA: Diagnosis not present

## 2017-10-09 DIAGNOSIS — R531 Weakness: Secondary | ICD-10-CM | POA: Diagnosis not present

## 2017-10-09 DIAGNOSIS — R410 Disorientation, unspecified: Secondary | ICD-10-CM | POA: Diagnosis not present

## 2017-10-09 DIAGNOSIS — R4182 Altered mental status, unspecified: Secondary | ICD-10-CM | POA: Diagnosis not present

## 2017-10-09 DIAGNOSIS — I6330 Cerebral infarction due to thrombosis of unspecified cerebral artery: Secondary | ICD-10-CM | POA: Diagnosis not present

## 2017-10-09 DIAGNOSIS — G92 Toxic encephalopathy: Secondary | ICD-10-CM | POA: Diagnosis not present

## 2017-10-09 DIAGNOSIS — K861 Other chronic pancreatitis: Secondary | ICD-10-CM | POA: Diagnosis not present

## 2017-10-09 DIAGNOSIS — I10 Essential (primary) hypertension: Secondary | ICD-10-CM | POA: Diagnosis not present

## 2017-10-09 DIAGNOSIS — E1110 Type 2 diabetes mellitus with ketoacidosis without coma: Secondary | ICD-10-CM | POA: Diagnosis not present

## 2017-10-09 DIAGNOSIS — N179 Acute kidney failure, unspecified: Secondary | ICD-10-CM | POA: Diagnosis not present

## 2017-10-12 ENCOUNTER — Inpatient Hospital Stay (HOSPITAL_COMMUNITY)
Admission: EM | Admit: 2017-10-12 | Discharge: 2017-10-16 | DRG: 637 | Disposition: A | Discharge status: Home Health | Payer: Medicare Other | Attending: Family Medicine | Admitting: Family Medicine

## 2017-10-12 ENCOUNTER — Emergency Department (HOSPITAL_COMMUNITY): Payer: Medicare Other

## 2017-10-12 ENCOUNTER — Encounter (HOSPITAL_COMMUNITY): Payer: Self-pay | Admitting: Emergency Medicine

## 2017-10-12 ENCOUNTER — Encounter: Payer: Self-pay | Admitting: Family Medicine

## 2017-10-12 ENCOUNTER — Ambulatory Visit (INDEPENDENT_AMBULATORY_CARE_PROVIDER_SITE_OTHER): Payer: Medicare Other | Admitting: Family Medicine

## 2017-10-12 VITALS — BP 132/84 | HR 95 | Temp 99.0°F | Resp 16 | Ht 67.0 in | Wt 186.0 lb

## 2017-10-12 DIAGNOSIS — K861 Other chronic pancreatitis: Secondary | ICD-10-CM | POA: Diagnosis present

## 2017-10-12 DIAGNOSIS — E860 Dehydration: Secondary | ICD-10-CM | POA: Diagnosis present

## 2017-10-12 DIAGNOSIS — G9341 Metabolic encephalopathy: Secondary | ICD-10-CM | POA: Diagnosis not present

## 2017-10-12 DIAGNOSIS — E1165 Type 2 diabetes mellitus with hyperglycemia: Secondary | ICD-10-CM

## 2017-10-12 DIAGNOSIS — I6330 Cerebral infarction due to thrombosis of unspecified cerebral artery: Secondary | ICD-10-CM

## 2017-10-12 DIAGNOSIS — Z9841 Cataract extraction status, right eye: Secondary | ICD-10-CM | POA: Diagnosis not present

## 2017-10-12 DIAGNOSIS — Z87891 Personal history of nicotine dependence: Secondary | ICD-10-CM

## 2017-10-12 DIAGNOSIS — Z794 Long term (current) use of insulin: Secondary | ICD-10-CM | POA: Diagnosis not present

## 2017-10-12 DIAGNOSIS — Z9842 Cataract extraction status, left eye: Secondary | ICD-10-CM

## 2017-10-12 DIAGNOSIS — I6381 Other cerebral infarction due to occlusion or stenosis of small artery: Secondary | ICD-10-CM | POA: Diagnosis not present

## 2017-10-12 DIAGNOSIS — Z9350 Unspecified cystostomy status: Secondary | ICD-10-CM

## 2017-10-12 DIAGNOSIS — N179 Acute kidney failure, unspecified: Secondary | ICD-10-CM | POA: Diagnosis present

## 2017-10-12 DIAGNOSIS — R410 Disorientation, unspecified: Secondary | ICD-10-CM | POA: Diagnosis not present

## 2017-10-12 DIAGNOSIS — I10 Essential (primary) hypertension: Secondary | ICD-10-CM

## 2017-10-12 DIAGNOSIS — E785 Hyperlipidemia, unspecified: Secondary | ICD-10-CM | POA: Diagnosis present

## 2017-10-12 DIAGNOSIS — R531 Weakness: Secondary | ICD-10-CM | POA: Diagnosis not present

## 2017-10-12 DIAGNOSIS — Z6831 Body mass index (BMI) 31.0-31.9, adult: Secondary | ICD-10-CM

## 2017-10-12 DIAGNOSIS — Z9114 Patient's other noncompliance with medication regimen: Secondary | ICD-10-CM | POA: Diagnosis not present

## 2017-10-12 DIAGNOSIS — I2510 Atherosclerotic heart disease of native coronary artery without angina pectoris: Secondary | ICD-10-CM | POA: Diagnosis present

## 2017-10-12 DIAGNOSIS — G92 Toxic encephalopathy: Secondary | ICD-10-CM | POA: Diagnosis not present

## 2017-10-12 DIAGNOSIS — R400 Somnolence: Secondary | ICD-10-CM | POA: Diagnosis not present

## 2017-10-12 DIAGNOSIS — Z9119 Patient's noncompliance with other medical treatment and regimen: Secondary | ICD-10-CM

## 2017-10-12 DIAGNOSIS — I639 Cerebral infarction, unspecified: Secondary | ICD-10-CM | POA: Diagnosis not present

## 2017-10-12 DIAGNOSIS — Z8673 Personal history of transient ischemic attack (TIA), and cerebral infarction without residual deficits: Secondary | ICD-10-CM

## 2017-10-12 DIAGNOSIS — F0390 Unspecified dementia without behavioral disturbance: Secondary | ICD-10-CM | POA: Diagnosis present

## 2017-10-12 DIAGNOSIS — N390 Urinary tract infection, site not specified: Secondary | ICD-10-CM | POA: Diagnosis not present

## 2017-10-12 DIAGNOSIS — Z9221 Personal history of antineoplastic chemotherapy: Secondary | ICD-10-CM

## 2017-10-12 DIAGNOSIS — E1110 Type 2 diabetes mellitus with ketoacidosis without coma: Secondary | ICD-10-CM | POA: Diagnosis not present

## 2017-10-12 DIAGNOSIS — E440 Moderate protein-calorie malnutrition: Secondary | ICD-10-CM | POA: Diagnosis not present

## 2017-10-12 DIAGNOSIS — I6300 Cerebral infarction due to thrombosis of unspecified precerebral artery: Secondary | ICD-10-CM | POA: Diagnosis not present

## 2017-10-12 DIAGNOSIS — Z8551 Personal history of malignant neoplasm of bladder: Secondary | ICD-10-CM | POA: Diagnosis not present

## 2017-10-12 DIAGNOSIS — I959 Hypotension, unspecified: Secondary | ICD-10-CM | POA: Diagnosis not present

## 2017-10-12 DIAGNOSIS — E0810 Diabetes mellitus due to underlying condition with ketoacidosis without coma: Secondary | ICD-10-CM | POA: Diagnosis not present

## 2017-10-12 DIAGNOSIS — R4182 Altered mental status, unspecified: Secondary | ICD-10-CM | POA: Diagnosis not present

## 2017-10-12 DIAGNOSIS — R7309 Other abnormal glucose: Secondary | ICD-10-CM | POA: Diagnosis not present

## 2017-10-12 DIAGNOSIS — Z833 Family history of diabetes mellitus: Secondary | ICD-10-CM | POA: Diagnosis not present

## 2017-10-12 HISTORY — DX: Personal history of transient ischemic attack (TIA), and cerebral infarction without residual deficits: Z86.73

## 2017-10-12 LAB — URINALYSIS, ROUTINE W REFLEX MICROSCOPIC
Bilirubin Urine: NEGATIVE
Glucose, UA: >=500 mg/dL — AB
Ketones, ur: 5 mg/dL — AB
Nitrite: NEGATIVE
Protein, ur: 30 mg/dL — AB
Specific Gravity, Urine: 1.012 (ref 1.005–1.030)
Squamous Epithelial / LPF: NONE SEEN
pH: 6 (ref 5.0–8.0)

## 2017-10-12 LAB — MRSA PCR SCREENING: MRSA by PCR: NEGATIVE

## 2017-10-12 LAB — TROPONIN I
Troponin I: 0.05 ng/mL (ref <0.03)
Troponin I: 0.05 ng/mL (ref <0.03)

## 2017-10-12 LAB — CBC
HCT: 45 % (ref 39.0–52.0)
Hemoglobin: 16.4 g/dL (ref 13.0–17.0)
MCH: 30.7 pg (ref 26.0–34.0)
MCHC: 36.4 g/dL — ABNORMAL HIGH (ref 30.0–36.0)
MCV: 84.1 fL (ref 78.0–100.0)
Platelets: 372 10*3/uL (ref 150–400)
RBC: 5.35 MIL/uL (ref 4.22–5.81)
RDW: 12.7 % (ref 11.5–15.5)
WBC: 13.6 10*3/uL — ABNORMAL HIGH (ref 4.0–10.5)

## 2017-10-12 LAB — BASIC METABOLIC PANEL
Anion gap: 15 (ref 5–15)
Anion gap: 11 (ref 5–15)
BUN: 74 mg/dL — ABNORMAL HIGH (ref 6–20)
BUN: 65 mg/dL — ABNORMAL HIGH (ref 6–20)
CO2: 16 mmol/L — ABNORMAL LOW (ref 22–32)
CO2: 19 mmol/L — ABNORMAL LOW (ref 22–32)
Calcium: 9.7 mg/dL (ref 8.9–10.3)
Calcium: 9.6 mg/dL (ref 8.9–10.3)
Chloride: 99 mmol/L — ABNORMAL LOW (ref 101–111)
Chloride: 107 mmol/L (ref 101–111)
Creatinine, Ser: 1.26 mg/dL — ABNORMAL HIGH (ref 0.61–1.24)
Creatinine, Ser: 1.07 mg/dL (ref 0.61–1.24)
GFR calc Af Amer: >60 mL/min (ref >60)
GFR calc Af Amer: >60 mL/min (ref >60)
GFR calc non Af Amer: 52 mL/min — ABNORMAL LOW (ref >60)
GFR calc non Af Amer: >60 mL/min (ref >60)
Glucose, Bld: 490 mg/dL — ABNORMAL HIGH (ref 65–99)
Glucose, Bld: 213 mg/dL — ABNORMAL HIGH (ref 65–99)
Potassium: 4.2 mmol/L (ref 3.5–5.1)
Potassium: 3.3 mmol/L — ABNORMAL LOW (ref 3.5–5.1)
Sodium: 130 mmol/L — ABNORMAL LOW (ref 135–145)
Sodium: 137 mmol/L (ref 135–145)

## 2017-10-12 LAB — GLUCOSE, CAPILLARY
Glucose-Capillary: 234 mg/dL — ABNORMAL HIGH (ref 65–99)
Glucose-Capillary: 183 mg/dL — ABNORMAL HIGH (ref 65–99)
Glucose-Capillary: 127 mg/dL — ABNORMAL HIGH (ref 65–99)
Glucose-Capillary: 117 mg/dL — ABNORMAL HIGH (ref 65–99)

## 2017-10-12 LAB — CBG MONITORING, ED
Glucose-Capillary: 452 mg/dL — ABNORMAL HIGH (ref 65–99)
Glucose-Capillary: 349 mg/dL — ABNORMAL HIGH (ref 65–99)
Glucose-Capillary: 342 mg/dL — ABNORMAL HIGH (ref 65–99)
Glucose-Capillary: 360 mg/dL — ABNORMAL HIGH (ref 65–99)

## 2017-10-12 LAB — MAGNESIUM: Magnesium: 2.4 mg/dL (ref 1.7–2.4)

## 2017-10-12 MED ORDER — ENOXAPARIN SODIUM 40 MG/0.4ML ~~LOC~~ SOLN
40.0000 mg | SUBCUTANEOUS | Status: DC
  Administered 2017-10-12 – 2017-10-15 (×4): 40 mg via SUBCUTANEOUS
  Filled 2017-10-12 (×4): qty 0.4

## 2017-10-12 MED ORDER — SODIUM CHLORIDE 0.9 % IV SOLN
1.0000 g | INTRAVENOUS | Status: DC
  Administered 2017-10-13 – 2017-10-15 (×3): 1 g via INTRAVENOUS
  Filled 2017-10-12: qty 10
  Filled 2017-10-12: qty 1
  Filled 2017-10-12: qty 10
  Filled 2017-10-12: qty 1

## 2017-10-12 MED ORDER — SODIUM CHLORIDE 0.9 % IV SOLN
1.0000 g | Freq: Once | INTRAVENOUS | Status: AC
  Administered 2017-10-12: 1 g via INTRAVENOUS
  Filled 2017-10-12: qty 10

## 2017-10-12 MED ORDER — DEXTROSE-NACL 5-0.45 % IV SOLN: INTRAVENOUS | Status: DC

## 2017-10-12 MED ORDER — DEXTROSE-NACL 5-0.45 % IV SOLN
INTRAVENOUS | Status: DC
  Administered 2017-10-12 – 2017-10-13 (×2): via INTRAVENOUS

## 2017-10-12 MED ORDER — SODIUM CHLORIDE 0.9 % IV SOLN
INTRAVENOUS | Status: DC
  Administered 2017-10-12: 19:00:00 via INTRAVENOUS

## 2017-10-12 MED ORDER — SODIUM CHLORIDE 0.9 % IV SOLN
INTRAVENOUS | Status: DC
  Administered 2017-10-12: 2.9 [IU]/h via INTRAVENOUS
  Filled 2017-10-12: qty 1

## 2017-10-12 MED ORDER — ENSURE ENLIVE PO LIQD
237.0000 mL | Freq: Two times a day (BID) | ORAL | Status: DC
  Administered 2017-10-13: 237 mL via ORAL

## 2017-10-12 MED ORDER — SODIUM CHLORIDE 0.9 % IV SOLN: INTRAVENOUS | Status: AC

## 2017-10-12 MED ORDER — ENOXAPARIN SODIUM 30 MG/0.3ML ~~LOC~~ SOLN: 30.0000 mg | SUBCUTANEOUS | Status: DC

## 2017-10-12 MED ORDER — SODIUM CHLORIDE 0.9 % IV BOLUS
500.0000 mL | Freq: Once | INTRAVENOUS | Status: AC
  Administered 2017-10-12: 500 mL via INTRAVENOUS

## 2017-10-12 MED ORDER — SODIUM CHLORIDE 0.9 % IV SOLN
INTRAVENOUS | Status: DC
  Administered 2017-10-12: 14:00:00 via INTRAVENOUS

## 2017-10-12 MED ORDER — POTASSIUM CHLORIDE 10 MEQ/100ML IV SOLN
10.0000 meq | INTRAVENOUS | Status: AC
  Administered 2017-10-12 (×2): 10 meq via INTRAVENOUS
  Filled 2017-10-12 (×2): qty 100

## 2017-10-12 MED ORDER — POTASSIUM CHLORIDE 10 MEQ/100ML IV SOLN
10.0000 meq | INTRAVENOUS | Status: DC
  Administered 2017-10-13 (×3): 10 meq via INTRAVENOUS
  Filled 2017-10-12: qty 100

## 2017-10-12 NOTE — ED Triage Notes (Addendum)
Patient BIB wife and daughter, reports patient has had elevated CBG with increased weakness and thirst since 3/15. Per family, patient refused to take metformin but has been taking insulin since 3/27. Sent by PCP today due to continued hyperglycemia. Family also adds PCP discontinued all other medications except for insulin. Hx bladder cancer.

## 2017-10-12 NOTE — H&P (Signed)
History and Physical    Shane [name] WPY:[id number] DOB: 01-Oct-1935 DOA: 10/12/2017  PCP: Tammi Sou, MD Patient coming from: Home  Chief Complaint: Generalized weakness  HPI: Shane [name] is a 82 y.o. male with medical history significant of diabetes, hypertension, urothelial carcinoma with ileal conduit admitted with generalized weakness multiple falls and increasing sleepiness and thirst.  Patient has a history of diabetes but he stopped taking diabetes meds for years ago.  Saw his primary care physician today he was restarted on 15 units of Lantus 10/07/2017 his blood sugar readings still remained high at 421-487.  Family increase the Lantus dose to 23 units.  Trace mostly obtained from the ER records and patient's wife.  Denies any nausea vomiting or diarrhea.  His appetite has been poor.  He has no fever or chills.  He denies any chest pain dyspnea on exertion or shortness of breath.  EKG showed normal sinus rhythm with multiple PVCs.    ED Course: In the ER he was given IV fluids and Rocephin.  His blood glucose on arrival was 349.  CT of the head no acute abnormality che is 130 potassium is 4.2 BUN 74 creatinine was 1.26 which is elevated compared to his baseline his baseline from 1.2.  White count 13.6 hemoglobin 16.4 platelet count 372.  Glucose was 490.  Hemoglobin A1c was 12.7 on 10/07/2017.  Chest x-ray no acute abnormality.  He was started on IV insulin drip in the ER.  Review of Systems: As per HPI otherwise all other systems reviewed and are negative Past Medical History:  Diagnosis Date  . Abnormal EKG 2014   This led to stress test which was abnl, which led to cardiology referral: cath showed small vessel dz of 2nd diag branch with 70-80% stenosis--preserved LV function  . Bilateral renal cysts    Non-complex (CT 03/2016): no enhancement, coarse calcifications, mass effect does give false appearance of hydro on non-contract series (followed by urol).  .  Bladder cancer ([place]) 03/2016   Invasive high grade urothelial carcinoma.  No mets at dx.  Cystoscopy + bx and partial resection of tumor in hosp when admitted for gross hematuria and AUR.  Neoadj chemo 06/2016, then got cystoprostatectomy. (Urostomy--ileal conduit urinary diversion with refluxing anastamoses--has RLQ urostomy)-gets routine surveillance by urol (Dr. Tresa Moore), most recent 04/2017--no sign of recurrence--obs status  . CAD (coronary artery disease)    small vessel dz x 1 vessel on cath 2014: med mgmt  . Chronic calcific pancreatitis ([place]) 03/2016   CT: also small cyst in head of pancreas that needs 6 mo f/u imaging (09/2016)  . Diabetes mellitus without complication ([place])   . Heart murmur, systolic    Noted in prior PCP's records.  I recommended echocardiogram 07/2016 but pt declined.  [name] History of Bell's palsy   . History of chemotherapy   . Hyperlipidemia   . Hypertension   . Pancreas cyst 03/2016   Repeat imaging with MRI pancreas protocol 09/2016  . Umbilical hernia    Repaired when he got his bladder surgery.    Past Surgical History:  Procedure Laterality Date  . CARDIAC CATHETERIZATION  02/09/2013   small vessel dz of 2nd diag branch with 70-80% stenosis--preserved LV function. Medical therapy rec'd. Agustin Cree, PA)  . CARDIOVASCULAR STRESS TEST  01/26/2013   No ischemia.  Wall motion abnormalities at inferior base suggesting small area of infarction.  EF 64%.  [name] CATARACT EXTRACTION    . cystoprostatectomy w/LN surgery  10/17/2016   Ileal conduit: this is an incontinent urine diversion that directs urine from the ureters through a segment of isolated bowel to the surface of the abdominal wall via a cutaneous stoma, and urine drains continuously into an external ostomy appliance.  . CYSTOSCOPY WITH INJECTION N/A 10/17/2016   Procedure: CYSTOSCOPY WITH INJECTION OF INDOCYANINE GREEN DYE;  Surgeon: Alexis Frock, MD;  Location: [facility];  Service: Urology;  Laterality: N/A;  .  EYE SURGERY     cataract surgery bilat   . IR GENERIC HISTORICAL  05/12/2016   IR US GUIDE VASC ACCESS RIGHT 05/12/2016 Greggory Keen, MD [facility]  . IR GENERIC HISTORICAL  05/12/2016   IR FLUORO GUIDE PORT INSERTION RIGHT 05/12/2016 Greggory Keen, MD [facility]  . IR REMOVAL TUN ACCESS W/ PORT W/O FL MOD SED  07/03/2017  . OSTOMY     urostomy  . TONSILLECTOMY AND ADENOIDECTOMY  Remote  . TRANSURETHRAL RESECTION OF PROSTATE N/A 04/02/2016   Procedure: CYSTOSCOPY, TRANSURETHRAL RESECTION OF BLADDER TUMOR;  Surgeon: [name], MD;  Location: [facility];  Service: Urology;  Laterality: N/A;  . UMBILICAL HERNIA REPAIR  10/2016  . VASECTOMY      Social History   Socioeconomic History  . Marital status: Married    Spouse name: Not on file  . Number of children: Not on file  . Years of education: Not on file  . Highest education level: Not on file  Occupational History  . Not on file  Social Needs  . Financial resource strain: Not on file  . Food insecurity:    Worry: Not on file    Inability: Not on file  . Transportation needs:    Medical: Not on file    Non-medical: Not on file  Tobacco Use  . Smoking status: Former Smoker    Years: 3.00  . Smokeless tobacco: Never Used  Substance and Sexual Activity  . Alcohol use: No    Alcohol/week: 0.6 oz    Types: 1 Cans of beer per week    Frequency: Never  . Drug use: No  . Sexual activity: Not on file  Lifestyle  . Physical activity:    Days per week: Not on file    Minutes per session: Not on file  . Stress: Not on file  Relationships  . Social connections:    Talks on phone: Not on file    Gets together: Not on file    Attends religious service: Not on file    Active member of club or organization: Not on file    Attends meetings of clubs or organizations: Not on file    Relationship status: Not on file  . Intimate partner violence:    Fear of current or ex partner: Not on file    Emotionally abused: Not on  file    Physically abused: Not on file    Forced sexual activity: Not on file  Other Topics Concern  . Not on file  Social History Narrative   Married, 3 children, 7 grandchildren.   Occupation: retired Engineer, site.   No tob, rare alc.    Allergies  Allergen Reactions  . Other Swelling    Pt states had swelling of the eye when having eye surgery and the anesthetic placed in eye     Family History  Problem Relation Age of Onset  . Diabetes Mellitus I Mother   . Diabetes Mellitus I Sister     Prior to Admission medications  Medication Sig Start Date End Date Taking? Authorizing Provider  insulin glargine (LANTUS) 100 unit/mL SOPN 15 U SQ qhs, will be titrating 10/07/17  Yes McGowen, [name], MD  lisinopril (PRINIVIL,ZESTRIL) 20 MG tablet Take 1 tablet (20 mg total) by mouth 2 (two) times daily. 08/04/17  Yes McGowen, [name], MD  polyethylene glycol (MIRALAX / GLYCOLAX) packet Take 17 g by mouth daily.   Yes [provider]  Insulin Pen Needle (PEN NEEDLES) 32G X 4 MM MISC 1 each by Does not apply route daily. 10/07/17   McGowen, [name], MD  lidocaine-prilocaine (EMLA) cream Apply 1 application topically as needed. Apply to port before chemotherapy. Patient not taking: Reported on 10/07/2017 05/01/16   Wyatt Portela, MD  metFORMIN (GLUCOPHAGE) 1000 MG tablet Take 1 tablet (1,000 mg total) by mouth 2 (two) times daily with a meal. Patient not taking: Reported on 10/07/2017 03/02/17   Tammi Sou, MD  omega-3 acid ethyl esters (LOVAZA) 1 g capsule TAKE 1 CAPSULE (1 G TOTAL) BY MOUTH DAILY. Patient not taking: Reported on 10/07/2017 04/22/17   Tammi Sou, MD  pravastatin (PRAVACHOL) 20 MG tablet TAKE 1 TABLET (20 MG TOTAL) BY MOUTH DAILY. Patient not taking: Reported on 10/12/2017 04/24/17   Tammi Sou, MD  prochlorperazine (COMPAZINE) 10 MG tablet Take 1 tablet (10 mg total) by mouth every 6 (six) hours as needed for nausea or vomiting. Patient not taking:  Reported on 10/07/2017 05/01/16   Wyatt Portela, MD  senna-docusate (SENOKOT-S) 8.6-50 MG tablet Take 1 tablet by mouth 2 (two) times daily. As needed to prevent constipation while taking strong pain meds. Patient not taking: Reported on 10/07/2017 10/23/16   Alexis Frock, MD  traMADol (ULTRAM) 50 MG tablet Take 1-2 tablets (50-100 mg total) by mouth every 6 (six) hours as needed for moderate pain or severe pain. Post-operatively Patient not taking: Reported on 10/07/2017 10/23/16   Alexis Frock, MD    Physical Exam: Vitals:   10/12/17 1216 10/12/17 1600  BP: (!) 152/99 (!) 149/97  Pulse: 95   Resp: 20 17  Temp: (!) 97.3 F (36.3 C)   TempSrc: Oral   SpO2: 100%      General:Appears calm and comfortable Eyes:  PERRL, EOMI, normal lids, iris ENT:  grossly normal hearing, lips & tongue, mmm Neck:  no LAD, masses or thyromegaly Cardiovascular:  RRR, no m/r/g. No LE edema.  Respiratory:  CTA bilaterally, no w/r/r. Normal respiratory effort. Abdomen:  soft, ntnd, NABS.ILEAL CONDUIT DRAINING URINE.Skin: no rash or induration seen on limited exam Musculoskeletal:  grossly normal tone BUE/BLE, good ROM, no bony abnormality Psychiatric:  grossly normal mood and affect, speech fluent and appropriate, AOx3 Neurologic: CN 2-12 grossly intact, moves all extremities in coordinated fashion, sensation intact  Labs on Admission: I have personally reviewed following labs and imaging studies  CBC: Recent Labs  Lab 10/12/17 1244  WBC 13.6*  HGB 16.4  HCT 45.0  MCV 84.1  PLT 976   Basic Metabolic Panel: Recent Labs  Lab 10/12/17 1244  NA 130*  K 4.2  CL 99*  CO2 16*  GLUCOSE 490*  BUN 74*  CREATININE 1.26*  CALCIUM 9.7   GFR: Estimated Creatinine Clearance: 48.5 mL/min (A) (by C-G formula based on SCr of 1.26 mg/dL (H)). Liver Function Tests: No results for input(s): AST, ALT, ALKPHOS, BILITOT, PROT, ALBUMIN in the last 168 hours. No results for input(s): LIPASE, AMYLASE in  the last 168 hours. No results  for input(s): AMMONIA in the last 168 hours. Coagulation Profile: No results for input(s): INR, PROTIME in the last 168 hours. Cardiac Enzymes: Recent Labs  Lab 10/12/17 1244  TROPONINI 0.05*   BNP (last 3 results) No results for input(s): PROBNP in the last 8760 hours. HbA1C: No results for input(s): HGBA1C in the last 72 hours. CBG: Recent Labs  Lab 10/12/17 1216 10/12/17 1544 10/12/17 1657  GLUCAP 452* 349* 342*   Lipid Profile: No results for input(s): CHOL, HDL, LDLCALC, TRIG, CHOLHDL, LDLDIRECT in the last 72 hours. Thyroid Function Tests: No results for input(s): TSH, T4TOTAL, FREET4, T3FREE, THYROIDAB in the last 72 hours. Anemia Panel: No results for input(s): VITAMINB12, FOLATE, FERRITIN, TIBC, IRON, RETICCTPCT in the last 72 hours. Urine analysis:    Component Value Date/Time   COLORURINE YELLOW 10/12/2017 1257   APPEARANCEUR TURBID (A) 10/12/2017 1257   LABSPEC 1.012 10/12/2017 1257   PHURINE 6.0 10/12/2017 1257   GLUCOSEU >=500 (A) 10/12/2017 1257   HGBUR MODERATE (A) 10/12/2017 1257   BILIRUBINUR NEGATIVE 10/12/2017 1257   BILIRUBINUR negative 04/14/2016 1456   KETONESUR 5 (A) 10/12/2017 1257   PROTEINUR 30 (A) 10/12/2017 1257   UROBILINOGEN 0.2 04/14/2016 1456   NITRITE NEGATIVE 10/12/2017 1257   LEUKOCYTESUR MODERATE (A) 10/12/2017 1257    Creatinine Clearance: Estimated Creatinine Clearance: 48.5 mL/min (A) (by C-G formula based on SCr of 1.26 mg/dL (H)).  Sepsis Labs: @LABRCNTIP (procalcitonin:4,lacticidven:4) )No results found for this or any previous visit (from the past 240 hour(s)).   Radiological Exams on Admission: Dg Chest 2 View  Result Date: 10/12/2017 CLINICAL DATA:  Elevated blood glucose EXAM: CHEST - 2 VIEW COMPARISON:  10/09/2017 FINDINGS: Heart size and vascularity normal. Negative for pneumonia. Lungs are clear. IMPRESSION: No active cardiopulmonary disease. Electronically Signed   By: Franchot Gallo M.D.   On: 10/12/2017 15:00   Ct Head Wo Contrast  Result Date: 10/12/2017 CLINICAL DATA:  82 year old male with recent increased weakness and thirst. Hyperglycemia. Altered mental status. EXAM: CT HEAD WITHOUT CONTRAST TECHNIQUE: Contiguous axial images were obtained from the base of the skull through the vertex without intravenous contrast. COMPARISON:  No prior brain imaging. FINDINGS: Brain: Mild dystrophic calcifications in the bilateral globus pallidus and deep cerebellar nuclei. Cerebral volume is within normal limits for age. Mild dural calcifications. No midline shift, ventriculomegaly, mass effect, evidence of mass lesion, intracranial hemorrhage or evidence of cortically based acute infarction. Gray-white matter differentiation is within normal limits throughout the brain. No cortical encephalomalacia. Vascular: Calcified atherosclerosis at the skull base. No suspicious intracranial vascular hyperdensity. There is intracranial artery tortuosity, including the dominant appearing distal left vertebral artery. Skull: No acute osseous abnormality identified. Sinuses/Orbits: Hyperplastic and well pneumatized. Other: No acute orbit or scalp soft tissue finding. IMPRESSION: No acute intracranial abnormality. Normal for age non contrast CT appearance of the brain. Electronically Signed   By: Genevie Ann M.D.   On: 10/12/2017 14:33    EKG: Independently reviewed.   Assessment/Plan Active Problems:   * No active hospital problems. *   1] mild DKA-patient presented with generalized weakness increased thirst decreased appetite was found to have a CBG of 452 upon arrival to the ER, gap of 15 CO2 of 16 potassium 4.2.  Patient has been started on insulin drip in the ER.  Will continue that for now.  I would think he would be able to come off the drip within 24 hours..Lantus once he is coming off the drip.  2] urinary  tract infection patient has moderate amount of leukocytes and very turbid urine with  increased WBC count he received Rocephin in the ER.  Continue that and follow-up urine culture.  Patient does have a ileal conduit secondary to urothelial cancer.  3] hyperlipidemia continue statin and lovaza.  4] hypertension restart lisinopril.  5] AKI secondary to dehydration creatinine up to 1.26 will continue IV fluids and recheck labs tomorrow.   DVT prophylaxis: LOVENOX Code Status: FULL Family Communication: DW WIFE Disposition Plan: TBD Consults called: NONE Admission status: IN PATIENT  [name] MD [facility]  If 7PM-7AM, please contact night-coverage [email or web address] Password [email or web address]  10/12/2017, 5:05 PM

## 2017-10-12 NOTE — ED Notes (Signed)
ED TO INPATIENT HANDOFF REPORT  Name/Age/Gender Shane [name] 82 y.o. male  Code Status    Code Status Orders  (From admission, onward)        Start     Ordered   10/12/17 1734  Full code  Continuous     10/12/17 1738    Code Status History    Date Active Date Inactive Code Status Order ID Comments User Context   10/12/2017 1726 10/12/2017 1738 Full Code [id number]  [name], MD ED   04/01/2016 0218 04/02/2016 2349 Full Code [id number]  Ivor Costa, MD Inpatient      Home/SNF/Other Home  Chief Complaint Blood Sugar Issues; Weakness; Confusion  Level of Care/Admitting Diagnosis ED Disposition    ED Disposition Condition [facility]: [place] [[id number]]  Level of Care: Stepdown [14]  Admit to SDU based on following criteria: Severe physiological/psychological symptoms:  Any diagnosis requiring assessment & intervention at least every 4 hours on an ongoing basis to obtain desired patient outcomes including stability and rehabilitation  Diagnosis: DKA (diabetic ketoacidoses) [facility]) [[id number]]  Admitting Physician: [name] [[id number]]  Attending Physician: [name] [[id number]]  Estimated length of stay: 3 - 4 days  Certification:: I certify this patient will need inpatient services for at least 2 midnights  PT Class (Do Not Modify): Inpatient [101]  PT Acc Code (Do Not Modify): Private [1]       Medical History Past Medical History:  Diagnosis Date  . Abnormal EKG 2014   This led to stress test which was abnl, which led to cardiology referral: cath showed small vessel dz of 2nd diag branch with 70-80% stenosis--preserved LV function  . Bilateral renal cysts    Non-complex (CT 03/2016): no enhancement, coarse calcifications, mass effect does give false appearance of hydro on non-contract series (followed by urol).  . Bladder cancer ([place]) 03/2016   Invasive high grade urothelial carcinoma.  No mets at  dx.  Cystoscopy + bx and partial resection of tumor in hosp when admitted for gross hematuria and AUR.  Neoadj chemo 06/2016, then got cystoprostatectomy. (Urostomy--ileal conduit urinary diversion with refluxing anastamoses--has RLQ urostomy)-gets routine surveillance by urol (Dr. Tresa Moore), most recent 04/2017--no sign of recurrence--obs status  . CAD (coronary artery disease)    small vessel dz x 1 vessel on cath 2014: med mgmt  . Chronic calcific pancreatitis ([place]) 03/2016   CT: also small cyst in head of pancreas that needs 6 mo f/u imaging (09/2016)  . Diabetes mellitus without complication ([place])   . Heart murmur, systolic    Noted in prior PCP's records.  I recommended echocardiogram 07/2016 but pt declined.  [name] History of Bell's palsy   . History of chemotherapy   . Hyperlipidemia   . Hypertension   . Pancreas cyst 03/2016   Repeat imaging with MRI pancreas protocol 09/2016  . Umbilical hernia    Repaired when he got his bladder surgery.    Allergies Allergies  Allergen Reactions  . Other Swelling    Pt states had swelling of the eye when having eye surgery and the anesthetic placed in eye     IV Location/Drains/Wounds Patient Lines/Drains/Airways Status   Active Line/Drains/Airways    Name:   Placement date:   Placement time:   Site:   Days:   Peripheral IV 10/12/17 Right Forearm   10/12/17    1243    Forearm   less than 1  Urostomy Ileal conduit RUQ   10/17/16    1231    RUQ   360   Ureteral Drain/Stent Left ureter 7 Fr.   10/17/16    1230    Left ureter   360   Ureteral Drain/Stent Right ureter 7 Fr.   10/17/16    1231    Right ureter   360   Incision - 3 Ports Abdomen 1: Left;Lateral 2: Right;Mid;Lateral 3: Right;Lateral   10/17/16    0815     360          Labs/Imaging Results for orders placed or performed during the hospital encounter of 10/12/17 (from the past 48 hour(s))  CBG monitoring, ED     Status: Abnormal   Collection Time: 10/12/17 12:16 PM  Result Value  Ref Range   Glucose-Capillary 452 (H) 65 - 99 mg/dL  Basic metabolic panel     Status: Abnormal   Collection Time: 10/12/17 12:44 PM  Result Value Ref Range   Sodium 130 (L) 135 - 145 mmol/L   Potassium 4.2 3.5 - 5.1 mmol/L   Chloride 99 (L) 101 - 111 mmol/L   CO2 16 (L) 22 - 32 mmol/L   Glucose, Bld 490 (H) 65 - 99 mg/dL   BUN 74 (H) 6 - 20 mg/dL   Creatinine, Ser 1.26 (H) 0.61 - 1.24 mg/dL   Calcium 9.7 8.9 - 10.3 mg/dL   GFR calc non Af Amer 52 (L) >60 mL/min   GFR calc Af Amer >60 >60 mL/min    Comment: (NOTE) The eGFR has been calculated using the CKD EPI equation. This calculation has not been validated in all clinical situations. eGFR's persistently <60 mL/min signify possible Chronic Kidney Disease.    Anion gap 15 5 - 15    Comment: Performed at [facility], [place] [address], [place], [place] [address]  CBC     Status: Abnormal   Collection Time: 10/12/17 12:44 PM  Result Value Ref Range   WBC 13.6 (H) 4.0 - 10.5 K/uL   RBC 5.35 4.22 - 5.81 MIL/uL   Hemoglobin 16.4 13.0 - 17.0 g/dL   HCT 45.0 39.0 - 52.0 %   MCV 84.1 78.0 - 100.0 fL   MCH 30.7 26.0 - 34.0 pg   MCHC 36.4 (H) 30.0 - 36.0 g/dL   RDW 12.7 11.5 - 15.5 %   Platelets 372 150 - 400 K/uL    Comment: Performed at [facility], [place] [address] [place], [place] [address]  Troponin I     Status: Abnormal   Collection Time: 10/12/17 12:44 PM  Result Value Ref Range   Troponin I 0.05 (HH) <0.03 ng/mL    Comment: CRITICAL RESULT CALLED TO, READ BACK BY AND VERIFIED WITH: S.LEONARD RN [id number] [phone] A.[email or web address] Performed at [facility], [place] [address], [place], [place] [address]   Urinalysis, Routine w reflex microscopic     Status: Abnormal   Collection Time: 10/12/17 12:57 PM  Result Value Ref Range   Color, Urine YELLOW YELLOW   APPearance TURBID (A) CLEAR   Specific Gravity, Urine 1.012 1.005 - 1.030   pH 6.0 5.0 - 8.0   Glucose, UA >=500 (A) NEGATIVE  mg/dL   Hgb urine dipstick MODERATE (A) NEGATIVE   Bilirubin Urine NEGATIVE NEGATIVE   Ketones, ur 5 (A) NEGATIVE mg/dL   Protein, ur 30 (A) NEGATIVE mg/dL   Nitrite NEGATIVE NEGATIVE   Leukocytes, UA MODERATE (A) NEGATIVE  RBC / HPF 6-30 0 - 5 RBC/hpf   WBC, UA TOO NUMEROUS TO COUNT 0 - 5 WBC/hpf   Bacteria, UA MANY (A) NONE SEEN   Squamous Epithelial / LPF NONE SEEN NONE SEEN   Mucus PRESENT     Comment: Performed at [facility], [place] [address], [place], [place] [address]  CBG monitoring, ED     Status: Abnormal   Collection Time: 10/12/17  3:44 PM  Result Value Ref Range   Glucose-Capillary 349 (H) 65 - 99 mg/dL  CBG monitoring, ED     Status: Abnormal   Collection Time: 10/12/17  4:57 PM  Result Value Ref Range   Glucose-Capillary 342 (H) 65 - 99 mg/dL  CBG monitoring, ED     Status: Abnormal   Collection Time: 10/12/17  5:58 PM  Result Value Ref Range   Glucose-Capillary 360 (H) 65 - 99 mg/dL   Dg Chest 2 View  Result Date: 10/12/2017 CLINICAL DATA:  Elevated blood glucose EXAM: CHEST - 2 VIEW COMPARISON:  10/09/2017 FINDINGS: Heart size and vascularity normal. Negative for pneumonia. Lungs are clear. IMPRESSION: No active cardiopulmonary disease. Electronically Signed   By: Franchot Gallo M.D.   On: 10/12/2017 15:00   Ct Head Wo Contrast  Result Date: 10/12/2017 CLINICAL DATA:  82 year old male with recent increased weakness and thirst. Hyperglycemia. Altered mental status. EXAM: CT HEAD WITHOUT CONTRAST TECHNIQUE: Contiguous axial images were obtained from the base of the skull through the vertex without intravenous contrast. COMPARISON:  No prior brain imaging. FINDINGS: Brain: Mild dystrophic calcifications in the bilateral globus pallidus and deep cerebellar nuclei. Cerebral volume is within normal limits for age. Mild dural calcifications. No midline shift, ventriculomegaly, mass effect, evidence of mass lesion, intracranial hemorrhage or evidence of  cortically based acute infarction. Gray-white matter differentiation is within normal limits throughout the brain. No cortical encephalomalacia. Vascular: Calcified atherosclerosis at the skull base. No suspicious intracranial vascular hyperdensity. There is intracranial artery tortuosity, including the dominant appearing distal left vertebral artery. Skull: No acute osseous abnormality identified. Sinuses/Orbits: Hyperplastic and well pneumatized. Other: No acute orbit or scalp soft tissue finding. IMPRESSION: No acute intracranial abnormality. Normal for age non contrast CT appearance of the brain. Electronically Signed   By: Genevie Ann M.D.   On: 10/12/2017 14:33    Pending Labs Unresulted Labs (From admission, onward)   Start     Ordered   10/13/17 8937  Basic metabolic panel  Tomorrow morning,   R     10/12/17 1725   10/13/17 0500  CBC  Tomorrow morning,   R     10/12/17 1725   10/12/17 3428  Basic metabolic panel  Now then every 6 hours,   STAT     10/12/17 1738   10/12/17 1734  Troponin I (q 6hr x 3)  Now then every 6 hours,   R     10/12/17 1733   10/12/17 1734  Magnesium  Once,   R     10/12/17 1733   10/12/17 1323  Urine culture  STAT,   STAT     10/12/17 1322      Vitals/Pain Today's Vitals   10/12/17 1216 10/12/17 1600 10/12/17 1700 10/12/17 1708  BP: (!) 152/99 (!) 149/97 (!) 168/96   Pulse: 95   84  Resp: _0 Temp: (!) 97.3 F (36.3 C)     TempSrc: Oral     SpO2: 100%   97%  Isolation Precautions No active isolations  Medications Medications  0.9 %  sodium chloride infusion ( Intravenous New Bag/Given 10/12/17 1357)  dextrose 5 %-0.45 % sodium chloride infusion ( Intravenous Hold 10/12/17 1436)  insulin regular (NOVOLIN R,HUMULIN R) 100 Units in sodium chloride 0.9 % 100 mL (1 Units/mL) infusion (9 Units/hr Intravenous Rate/Dose Change 10/12/17 1804)  cefTRIAXone (ROCEPHIN) 1 g in sodium chloride 0.9 % 100 mL IVPB (has no administration in time range)  0.9 %   sodium chloride infusion (has no administration in time range)  0.9 %  sodium chloride infusion (has no administration in time range)  dextrose 5 %-0.45 % sodium chloride infusion (has no administration in time range)  potassium chloride 10 mEq in 100 mL IVPB (has no administration in time range)  enoxaparin (LOVENOX) injection 30 mg (has no administration in time range)  sodium chloride 0.9 % bolus 500 mL (0 mLs Intravenous Stopped 10/12/17 1457)  cefTRIAXone (ROCEPHIN) 1 g in sodium chloride 0.9 % 100 mL IVPB (0 g Intravenous Stopped 10/12/17 1503)    Mobility walks

## 2017-10-12 NOTE — ED Provider Notes (Signed)
[facility] DEPT Provider Note   CSN: [id number] Arrival date & time: 10/12/17  1208     History   Chief Complaint Chief Complaint  Patient presents with  . Hyperglycemia    HPI Shane [name] is a 82 y.o. male.   Hyperglycemia  Pt was seen at 1310. Per pt and his family, c/o gradual onset and persistence of constant "high blood sugars" for the past several weeks. Pt's family states pt has been intermittently "confused" for "a while now." This, as well as generalized weakness, worsened approximately 3 weeks ago. Pt was evaluated by his PMD and dx elevated CBG, rx insulin. Pt states he was dx DM "years ago" but stopped taking his meds and checking his CBG's at home. Pt states DM meds "made me sick" but cannot further elaborate what his symptoms were. Pt's family states pt has been taking his SQ insulin as prescribed by his PMD, as well as titrating up his dose as instructed. Pt's CBG's at home remain in the 400's and 500's range. Denies abd pain, no N/V/D, no CP/SOB, no cough, no back pain, no fevers, no rash, no focal motor weakness.     Past Medical History:  Diagnosis Date  . Abnormal EKG 2014   This led to stress test which was abnl, which led to cardiology referral: cath showed small vessel dz of 2nd diag branch with 70-80% stenosis--preserved LV function  . Bilateral renal cysts    Non-complex (CT 03/2016): no enhancement, coarse calcifications, mass effect does give false appearance of hydro on non-contract series (followed by urol).  . Bladder cancer ([place]) 03/2016   Invasive high grade urothelial carcinoma.  No mets at dx.  Cystoscopy + bx and partial resection of tumor in hosp when admitted for gross hematuria and AUR.  Neoadj chemo 06/2016, then got cystoprostatectomy. (Urostomy--ileal conduit urinary diversion with refluxing anastamoses--has RLQ urostomy)-gets routine surveillance by urol (Dr. Tresa Moore), most recent 04/2017--no sign of  recurrence--obs status  . CAD (coronary artery disease)    small vessel dz x 1 vessel on cath 2014: med mgmt  . Chronic calcific pancreatitis ([place]) 03/2016   CT: also small cyst in head of pancreas that needs 6 mo f/u imaging (09/2016)  . Diabetes mellitus without complication ([place])   . Heart murmur, systolic    Noted in prior PCP's records.  I recommended echocardiogram 07/2016 but pt declined.  [name] History of Bell's palsy   . History of chemotherapy   . Hyperlipidemia   . Hypertension   . Pancreas cyst 03/2016   Repeat imaging with MRI pancreas protocol 09/2016  . Umbilical hernia    Repaired when he got his bladder surgery.    Patient Active Problem List   Diagnosis Date Noted  . Port catheter in place 02/25/2017  . S/P ileal conduit ([place]) 10/17/2016  . Malignant neoplasm of urinary bladder ([place]) 05/01/2016  . Diabetes mellitus without complication ([place]) 94/49/6759  . UTI (urinary tract infection) 04/01/2016  . Hypertension   . Hematoma of bladder wall   . Hematuria 03/31/2016    Past Surgical History:  Procedure Laterality Date  . CARDIAC CATHETERIZATION  02/09/2013   small vessel dz of 2nd diag branch with 70-80% stenosis--preserved LV function. Medical therapy rec'd. Agustin Cree, PA)  . CARDIOVASCULAR STRESS TEST  01/26/2013   No ischemia.  Wall motion abnormalities at inferior base suggesting small area of infarction.  EF 64%.  [name] CATARACT EXTRACTION    . cystoprostatectomy w/LN surgery  10/17/2016   Ileal conduit: this is an incontinent urine diversion that directs urine from the ureters through a segment of isolated bowel to the surface of the abdominal wall via a cutaneous stoma, and urine drains continuously into an external ostomy appliance.  . CYSTOSCOPY WITH INJECTION N/A 10/17/2016   Procedure: CYSTOSCOPY WITH INJECTION OF INDOCYANINE GREEN DYE;  Surgeon: Alexis Frock, MD;  Location: [facility];  Service: Urology;  Laterality: N/A;  . EYE SURGERY     cataract surgery  bilat   . IR GENERIC HISTORICAL  05/12/2016   IR US GUIDE VASC ACCESS RIGHT 05/12/2016 Greggory Keen, MD [facility]  . IR GENERIC HISTORICAL  05/12/2016   IR FLUORO GUIDE PORT INSERTION RIGHT 05/12/2016 Greggory Keen, MD [facility]  . IR REMOVAL TUN ACCESS W/ PORT W/O FL MOD SED  07/03/2017  . OSTOMY     urostomy  . TONSILLECTOMY AND ADENOIDECTOMY  Remote  . TRANSURETHRAL RESECTION OF PROSTATE N/A 04/02/2016   Procedure: CYSTOSCOPY, TRANSURETHRAL RESECTION OF BLADDER TUMOR;  Surgeon: [name], MD;  Location: [facility];  Service: Urology;  Laterality: N/A;  . UMBILICAL HERNIA REPAIR  10/2016  . VASECTOMY          Home Medications    Prior to Admission medications   Medication Sig Start Date End Date Taking? Authorizing Provider  insulin glargine (LANTUS) 100 unit/mL SOPN 15 U SQ qhs, will be titrating 10/07/17  Yes McGowen, [name], MD  lisinopril (PRINIVIL,ZESTRIL) 20 MG tablet Take 1 tablet (20 mg total) by mouth 2 (two) times daily. 08/04/17  Yes McGowen, [name], MD  polyethylene glycol (MIRALAX / GLYCOLAX) packet Take 17 g by mouth daily.   Yes [provider]  Insulin Pen Needle (PEN NEEDLES) 32G X 4 MM MISC 1 each by Does not apply route daily. 10/07/17   McGowen, [name], MD  lidocaine-prilocaine (EMLA) cream Apply 1 application topically as needed. Apply to port before chemotherapy. Patient not taking: Reported on 10/07/2017 05/01/16   Wyatt Portela, MD  metFORMIN (GLUCOPHAGE) 1000 MG tablet Take 1 tablet (1,000 mg total) by mouth 2 (two) times daily with a meal. Patient not taking: Reported on 10/07/2017 03/02/17   Tammi Sou, MD  omega-3 acid ethyl esters (LOVAZA) 1 g capsule TAKE 1 CAPSULE (1 G TOTAL) BY MOUTH DAILY. Patient not taking: Reported on 10/07/2017 04/22/17   Tammi Sou, MD  pravastatin (PRAVACHOL) 20 MG tablet TAKE 1 TABLET (20 MG TOTAL) BY MOUTH DAILY. Patient not taking: Reported on 10/12/2017 04/24/17   Tammi Sou, MD    prochlorperazine (COMPAZINE) 10 MG tablet Take 1 tablet (10 mg total) by mouth every 6 (six) hours as needed for nausea or vomiting. Patient not taking: Reported on 10/07/2017 05/01/16   Wyatt Portela, MD  senna-docusate (SENOKOT-S) 8.6-50 MG tablet Take 1 tablet by mouth 2 (two) times daily. As needed to prevent constipation while taking strong pain meds. Patient not taking: Reported on 10/07/2017 10/23/16   Alexis Frock, MD  traMADol (ULTRAM) 50 MG tablet Take 1-2 tablets (50-100 mg total) by mouth every 6 (six) hours as needed for moderate pain or severe pain. Post-operatively Patient not taking: Reported on 10/07/2017 10/23/16   Alexis Frock, MD    Family History Family History  Problem Relation Age of Onset  . Diabetes Mellitus I Mother   . Diabetes Mellitus I Sister     Social History Social History   Tobacco Use  . Smoking status: Former  Smoker    Years: 3.00  . Smokeless tobacco: Never Used  Substance Use Topics  . Alcohol use: No    Alcohol/week: 0.6 oz    Types: 1 Cans of beer per week    Frequency: Never  . Drug use: No     Allergies   Other   Review of Systems Review of Systems ROS: Statement: All systems negative except as marked or noted in the HPI; Constitutional: Negative for fever and chills. ; ; Eyes: Negative for eye pain, redness and discharge. ; ; ENMT: Negative for ear pain, hoarseness, nasal congestion, sinus pressure and sore throat. ; ; Cardiovascular: Negative for chest pain, palpitations, diaphoresis, dyspnea and peripheral edema. ; ; Respiratory: Negative for cough, wheezing and stridor. ; ; Gastrointestinal: Negative for nausea, vomiting, diarrhea, abdominal pain, blood in stool, hematemesis, jaundice and rectal bleeding. . ; ; Genitourinary: +polyuria. Negative for dysuria, flank pain and hematuria. ; ; Musculoskeletal: Negative for back pain and neck pain. Negative for swelling and trauma.; ; Skin: Negative for pruritus, rash, abrasions,  blisters, bruising and skin lesion.; ; Neuro: +confusion, generalized weakness. Negative for headache, lightheadedness and neck stiffness. Negative for altered level of consciousness, extremity weakness, paresthesias, involuntary movement, seizure and syncope.       Physical Exam Updated Vital Signs BP (!) 152/99 (BP Location: Right Arm)   Pulse 95   Temp (!) 97.3 F (36.3 C) (Oral)   Resp 20   SpO2 100%    Patient Vitals for the past 24 hrs:  BP Temp Temp src Pulse Resp SpO2  10/12/17 1216 (!) 152/99 (!) 97.3 F (36.3 C) Oral 95 20 100 %     Physical Exam 1315: Physical examination:  Nursing notes reviewed; Vital signs and O2 SAT reviewed;  Constitutional: Well developed, Well nourished, In no acute distress; Head:  Normocephalic, atraumatic; Eyes: EOMI, PERRL, No scleral icterus; ENMT: Mouth and pharynx normal, Mucous membranes dry; Neck: Supple, Full range of motion, No lymphadenopathy; Cardiovascular: Regular rate and rhythm, No gallop; Respiratory: Breath sounds clear & equal bilaterally, No rales, rhonchi, wheezes.  Speaking full sentences with ease, Normal respiratory effort/excursion; Chest: Nontender, Movement normal; Abdomen: Soft, Nontender, Nondistended, Normal bowel sounds; Genitourinary: No CVA tenderness. +foley with yellow urine in bag.; Extremities: Peripheral pulses normal, No tenderness, No edema, No calf edema or asymmetry.; Neuro: AA&Ox3, Major CN grossly intact. No facial droop. Speech clear. No gross focal motor or sensory deficits in extremities.; Skin: Color normal, Warm, Dry.   ED Treatments / Results  Labs (all labs ordered are listed, but only abnormal results are displayed)   EKG EKG Interpretation  Date/Time:  Monday October 12 2017 15:05:44 EDT Ventricular Rate:  86 PR Interval:    QRS Duration: 104 QT Interval:  366 QTC Calculation: 438 R Axis:   -43 Text Interpretation:  Sinus rhythm Multiple ventricular premature complexes Left axis deviation  Repol abnrm suggests ischemia, anterolateral When compared with ECG of 10/16/2016 No significant change was found Confirmed by Francine Graven [phone]) on 10/12/2017 3:28:56 PM   Radiology   Procedures Procedures (including critical care time)  Medications Ordered in ED Medications  0.9 %  sodium chloride infusion ( Intravenous New Bag/Given 10/12/17 1357)  dextrose 5 %-0.45 % sodium chloride infusion ( Intravenous Hold 10/12/17 1436)  insulin regular (NOVOLIN R,HUMULIN R) 100 Units in sodium chloride 0.9 % 100 mL (1 Units/mL) infusion (has no administration in time range)  sodium chloride 0.9 % bolus 500 mL (0 mLs Intravenous Stopped  10/12/17 1457)  cefTRIAXone (ROCEPHIN) 1 g in sodium chloride 0.9 % 100 mL IVPB (1 g Intravenous New Bag/Given 10/12/17 1433)     Initial Impression / Assessment and Plan / ED Course  I have reviewed the triage vital signs and the nursing notes.  Pertinent labs & imaging results that were available during my care of the patient were reviewed by me and considered in my medical decision making (see chart for details).  MDM Reviewed: previous chart, nursing note and vitals Reviewed previous: labs and ECG Interpretation: labs, x-ray, ECG and CT scan Total time providing critical care: 30-74 minutes. This excludes time spent performing separately reportable procedures and services. Consults: admitting MD   CRITICAL CARE Performed by: Alfonzo Feller Total critical care time: 35 minutes Critical care time was exclusive of separately billable procedures and treating other patients. Critical care was necessary to treat or prevent imminent or life-threatening deterioration. Critical care was time spent personally by me on the following activities: development of treatment plan with patient and/or surrogate as well as nursing, discussions with consultants, evaluation of patient's response to treatment, examination of patient, obtaining history from patient or  surrogate, ordering and performing treatments and interventions, ordering and review of laboratory studies, ordering and review of radiographic studies, pulse oximetry and re-evaluation of patient's condition.   Results for orders placed or performed during the hospital encounter of 16/01/09  Basic metabolic panel  Result Value Ref Range   Sodium 130 (L) 135 - 145 mmol/L   Potassium 4.2 3.5 - 5.1 mmol/L   Chloride 99 (L) 101 - 111 mmol/L   CO2 16 (L) 22 - 32 mmol/L   Glucose, Bld 490 (H) 65 - 99 mg/dL   BUN 74 (H) 6 - 20 mg/dL   Creatinine, Ser 1.26 (H) 0.61 - 1.24 mg/dL   Calcium 9.7 8.9 - 10.3 mg/dL   GFR calc non Af Amer 52 (L) >60 mL/min   GFR calc Af Amer >60 >60 mL/min   Anion gap 15 5 - 15  CBC  Result Value Ref Range   WBC 13.6 (H) 4.0 - 10.5 K/uL   RBC 5.35 4.22 - 5.81 MIL/uL   Hemoglobin 16.4 13.0 - 17.0 g/dL   HCT 45.0 39.0 - 52.0 %   MCV 84.1 78.0 - 100.0 fL   MCH 30.7 26.0 - 34.0 pg   MCHC 36.4 (H) 30.0 - 36.0 g/dL   RDW 12.7 11.5 - 15.5 %   Platelets 372 150 - 400 K/uL  Urinalysis, Routine w reflex microscopic  Result Value Ref Range   Color, Urine YELLOW YELLOW   APPearance TURBID (A) CLEAR   Specific Gravity, Urine 1.012 1.005 - 1.030   pH 6.0 5.0 - 8.0   Glucose, UA >=500 (A) NEGATIVE mg/dL   Hgb urine dipstick MODERATE (A) NEGATIVE   Bilirubin Urine NEGATIVE NEGATIVE   Ketones, ur 5 (A) NEGATIVE mg/dL   Protein, ur 30 (A) NEGATIVE mg/dL   Nitrite NEGATIVE NEGATIVE   Leukocytes, UA MODERATE (A) NEGATIVE   RBC / HPF 6-30 0 - 5 RBC/hpf   WBC, UA TOO NUMEROUS TO COUNT 0 - 5 WBC/hpf   Bacteria, UA MANY (A) NONE SEEN   Squamous Epithelial / LPF NONE SEEN NONE SEEN   Mucus PRESENT   Troponin I  Result Value Ref Range   Troponin I 0.05 (HH) <0.03 ng/mL  CBG monitoring, ED  Result Value Ref Range   Glucose-Capillary 452 (H) 65 - 99 mg/dL   Dg  Chest 2 View Result Date: 10/12/2017 CLINICAL DATA:  Elevated blood glucose EXAM: CHEST - 2 VIEW COMPARISON:   10/09/2017 FINDINGS: Heart size and vascularity normal. Negative for pneumonia. Lungs are clear. IMPRESSION: No active cardiopulmonary disease. Electronically Signed   By: Franchot Gallo M.D.   On: 10/12/2017 15:00   Ct Head Wo Contrast Result Date: 10/12/2017 CLINICAL DATA:  82 year old male with recent increased weakness and thirst. Hyperglycemia. Altered mental status. EXAM: CT HEAD WITHOUT CONTRAST TECHNIQUE: Contiguous axial images were obtained from the base of the skull through the vertex without intravenous contrast. COMPARISON:  No prior brain imaging. FINDINGS: Brain: Mild dystrophic calcifications in the bilateral globus pallidus and deep cerebellar nuclei. Cerebral volume is within normal limits for age. Mild dural calcifications. No midline shift, ventriculomegaly, mass effect, evidence of mass lesion, intracranial hemorrhage or evidence of cortically based acute infarction. Gray-white matter differentiation is within normal limits throughout the brain. No cortical encephalomalacia. Vascular: Calcified atherosclerosis at the skull base. No suspicious intracranial vascular hyperdensity. There is intracranial artery tortuosity, including the dominant appearing distal left vertebral artery. Skull: No acute osseous abnormality identified. Sinuses/Orbits: Hyperplastic and well pneumatized. Other: No acute orbit or scalp soft tissue finding. IMPRESSION: No acute intracranial abnormality. Normal for age non contrast CT appearance of the brain. Electronically Signed   By: Genevie Ann M.D.   On: 10/12/2017 14:33   Results for SPIROS, [place] (MRN [id number]) as of 10/12/2017 15:35  Ref. Range 02/25/2017 11:43 06/18/2017 11:25 07/03/2017 09:48 10/03/2017 00:00 10/12/2017 12:44  BUN Latest Ref Range: 6 - 20 mg/dL 23.8 26.5 (H) 41 (H) 46 (A) 74 (H)  Creatinine Latest Ref Range: 0.61 - 1.24 mg/dL 1.1 1.2 1.40 (H) 1.0 1.26 (H)    1515:  BUN/Cr mildly elevated and CBG elevated. +UTI, UC pending. Judicious IVF bolus  given, followed by dose of IV rocephin and IV insulin gtt. Troponin mildly elevated, but EKG unchanged from previous and pt denies CP. T/C returned from [facility] Dr. Rodena Piety, case discussed, including:  HPI, pertinent PM/SHx, VS/PE, dx testing, ED course and treatment:  Agreeable to admit.     Final Clinical Impressions(s) / ED Diagnoses   Final diagnoses:  Hyperglycemia without ketosis  Urinary tract infection without hematuria, site unspecified  Confusion    ED Discharge Orders    None       Francine Graven, DO 10/13/17 1407

## 2017-10-12 NOTE — Progress Notes (Addendum)
OFFICE VISIT  10/12/2017   CC:  Chief Complaint  Patient presents with  . Follow-up    diabetes   HPI:    Patient is a 82 y.o. Caucasian male who presents for 5 day f/u uncontrolled DM. Started lantus 15 U qhs last visit, stopped his metformin. Held bp meds last visit. Stopped statin last visit.  Glucoses: fastings 421-487. 2H PP afternoons: 493-587 Bedtime: 483-560. Family have titrated lantus up to 23 U qhs now. BP avg about 140/85, HR avg 80s.  Was a little more alert first couple of days, then back to mild confusion and slow cognition like at last visit. No eating much at all. Still with excessive thirst and urination.  Past Medical History:  Diagnosis Date  . Abnormal EKG 2014   This led to stress test which was abnl, which led to cardiology referral: cath showed small vessel dz of 2nd diag branch with 70-80% stenosis--preserved LV function  . Bilateral renal cysts    Non-complex (CT 03/2016): no enhancement, coarse calcifications, mass effect does give false appearance of hydro on non-contract series (followed by urol).  . Bladder cancer ([place]) 03/2016   Invasive high grade urothelial carcinoma.  No mets at dx.  Cystoscopy + bx and partial resection of tumor in hosp when admitted for gross hematuria and AUR.  Neoadj chemo 06/2016, then got cystoprostatectomy. (Urostomy--ileal conduit urinary diversion with refluxing anastamoses--has RLQ urostomy)-gets routine surveillance by urol (Dr. Tresa Moore), most recent 04/2017--no sign of recurrence--obs status  . CAD (coronary artery disease)    small vessel dz x 1 vessel on cath 2014: med mgmt  . Chronic calcific pancreatitis ([place]) 03/2016   CT: also small cyst in head of pancreas that needs 6 mo f/u imaging (09/2016)  . Diabetes mellitus without complication ([place])   . Heart murmur, systolic    Noted in prior PCP's records.  I recommended echocardiogram 07/2016 but pt declined.  [name] History of Bell's palsy   . History of chemotherapy    . Hyperlipidemia   . Hypertension   . Pancreas cyst 03/2016   Repeat imaging with MRI pancreas protocol 09/2016  . Umbilical hernia    Repaired when he got his bladder surgery.    Past Surgical History:  Procedure Laterality Date  . CARDIAC CATHETERIZATION  02/09/2013   small vessel dz of 2nd diag branch with 70-80% stenosis--preserved LV function. Medical therapy rec'd. Agustin Cree, PA)  . CARDIOVASCULAR STRESS TEST  01/26/2013   No ischemia.  Wall motion abnormalities at inferior base suggesting small area of infarction.  EF 64%.  [name] CATARACT EXTRACTION    . cystoprostatectomy w/LN surgery  10/17/2016   Ileal conduit: this is an incontinent urine diversion that directs urine from the ureters through a segment of isolated bowel to the surface of the abdominal wall via a cutaneous stoma, and urine drains continuously into an external ostomy appliance.  . CYSTOSCOPY WITH INJECTION N/A 10/17/2016   Procedure: CYSTOSCOPY WITH INJECTION OF INDOCYANINE GREEN DYE;  Surgeon: Alexis Frock, MD;  Location: [facility];  Service: Urology;  Laterality: N/A;  . EYE SURGERY     cataract surgery bilat   . IR GENERIC HISTORICAL  05/12/2016   IR US GUIDE VASC ACCESS RIGHT 05/12/2016 Greggory Keen, MD [facility]  . IR GENERIC HISTORICAL  05/12/2016   IR FLUORO GUIDE PORT INSERTION RIGHT 05/12/2016 Greggory Keen, MD [facility]  . IR REMOVAL TUN ACCESS W/ PORT W/O FL MOD SED  07/03/2017  . OSTOMY  urostomy  . TONSILLECTOMY AND ADENOIDECTOMY  Remote  . TRANSURETHRAL RESECTION OF PROSTATE N/A 04/02/2016   Procedure: CYSTOSCOPY, TRANSURETHRAL RESECTION OF BLADDER TUMOR;  Surgeon: [name], MD;  Location: [facility];  Service: Urology;  Laterality: N/A;  . UMBILICAL HERNIA REPAIR  10/2016  . VASECTOMY      Outpatient Medications Prior to Visit  Medication Sig Dispense Refill  . insulin glargine (LANTUS) 100 unit/mL SOPN 15 U SQ qhs, will be titrating 15 mL 11  . Insulin Pen Needle (PEN NEEDLES)  32G X 4 MM MISC 1 each by Does not apply route daily. 100 each 11  . polyethylene glycol (MIRALAX / GLYCOLAX) packet Take 17 g by mouth daily.    . clotrimazole (LOTRIMIN) 1 % cream Apply 1 application topically daily as needed (fungus).    [name] lidocaine-prilocaine (EMLA) cream Apply 1 application topically as needed. Apply to port before chemotherapy. (Patient not taking: Reported on 10/07/2017) 30 g 0  . lisinopril (PRINIVIL,ZESTRIL) 20 MG tablet Take 1 tablet (20 mg total) by mouth 2 (two) times daily. (Patient not taking: Reported on 10/07/2017) 60 tablet 0  . metFORMIN (GLUCOPHAGE) 1000 MG tablet Take 1 tablet (1,000 mg total) by mouth 2 (two) times daily with a meal. (Patient not taking: Reported on 10/07/2017) 60 tablet 6  . omega-3 acid ethyl esters (LOVAZA) 1 g capsule TAKE 1 CAPSULE (1 G TOTAL) BY MOUTH DAILY. (Patient not taking: Reported on 10/07/2017) 90 capsule 1  . pravastatin (PRAVACHOL) 20 MG tablet TAKE 1 TABLET (20 MG TOTAL) BY MOUTH DAILY. (Patient not taking: Reported on 10/12/2017) 90 tablet 1  . prochlorperazine (COMPAZINE) 10 MG tablet Take 1 tablet (10 mg total) by mouth every 6 (six) hours as needed for nausea or vomiting. (Patient not taking: Reported on 10/07/2017) 30 tablet 0  . senna-docusate (SENOKOT-S) 8.6-50 MG tablet Take 1 tablet by mouth 2 (two) times daily. As needed to prevent constipation while taking strong pain meds. (Patient not taking: Reported on 10/07/2017) 30 tablet 0  . traMADol (ULTRAM) 50 MG tablet Take 1-2 tablets (50-100 mg total) by mouth every 6 (six) hours as needed for moderate pain or severe pain. Post-operatively (Patient not taking: Reported on 10/07/2017) 20 tablet 00   No facility-administered medications prior to visit.     Allergies  Allergen Reactions  . Other Swelling    Pt states had swelling of the eye when having eye surgery and the anesthetic placed in eye     ROS As per HPI  PE: Blood pressure 132/84, pulse 95, temperature 99 F (37.2  C), temperature source Temporal, resp. rate 16, height 5\' 7"  (1.702 m), weight 186 lb (84.4 kg), SpO2 97 %. Gen: Alert, well appearing.  Patient is oriented to person and situation. Sitting up in [place].  NAD--attempts to answer questions but often can't find the right words. NO further exam today.   LABS:  Lab Results  Component Value Date   HGBA1C 12.7 10/07/2017     Chemistry      Component Value Date/Time   NA 133 (A) 10/03/2017   NA 139 06/18/2017 1125   K 5.0 10/03/2017   K 4.5 06/18/2017 1125   CL 102 07/03/2017 0948   CO2 20 (L) 07/03/2017 0948   CO2 21 (L) 06/18/2017 1125   BUN 46 (A) 10/03/2017   BUN 26.5 (H) 06/18/2017 1125   CREATININE 1.0 10/03/2017   CREATININE 1.40 (H) 07/03/2017 0948   CREATININE 1.2 06/18/2017 1125   GLU  525 10/03/2017      Component Value Date/Time   CALCIUM 8.9 07/03/2017 0948   CALCIUM 9.3 06/18/2017 1125   ALKPHOS 95 10/03/2017   ALKPHOS 67 06/18/2017 1125   AST 9 (A) 10/03/2017   AST 12 06/18/2017 1125   ALT 10 10/03/2017   ALT 13 06/18/2017 1125   BILITOT 0.40 06/18/2017 1125     Lab Results  Component Value Date   WBC 8.5 07/03/2017   HGB 13.3 07/03/2017   HCT 37.8 (L) 07/03/2017   MCV 88.3 07/03/2017   PLT 238 07/03/2017     IMPRESSION AND PLAN:  Diabetes, poorly controlled. Suspect he is type I now.  No improvement in glucoses or mental status since getting on fairly low doses of lantus.  Discussed plan with family today and we were all in agreement that he needs to go straight to the ED for evaluation and likely admission to get more aggressive treatment and monitoring.  I felt like he had HONK at most recent visit 10/07/17, but I failed to notice that a CO2 was not on the labs done on 10/03/17 done by his specialist.  He very well could be in DKA, unfortunately.  I did talk to the wife and daughter about this and profusely apologized for failing to notice the absence of this lab result.  An After Visit Summary was  printed and given to the patient.   FOLLOW UP: to be determined depending on  Evaluation at ED tdoay.  Signed:  Crissie Sickles, MD           10/12/2017  ADDENDUM 11: 55 AM on 10/12/17: Reviewed outside labs from 10/03/17 again and these showed CO2 of 27. AG=12. I called and reassured pt's wife that there was no sign of DKA at last visit combined with labs 10/03/17.  I had simply failed to note this in my progress note from 10/07/17. Additionally, when looking in the labs in our EMR that had been abstracted from the 10/03/17 outside labs, the CO2 was not entered.  This led to the significant confusion today in the office. However, I still strongly feel that he needs to be seen in the ED today.  Signed:  Crissie Sickles, MD           10/12/2017

## 2017-10-13 DIAGNOSIS — E440 Moderate protein-calorie malnutrition: Secondary | ICD-10-CM

## 2017-10-13 DIAGNOSIS — E1110 Type 2 diabetes mellitus with ketoacidosis without coma: Principal | ICD-10-CM

## 2017-10-13 LAB — GLUCOSE, CAPILLARY
Glucose-Capillary: 137 mg/dL — ABNORMAL HIGH (ref 65–99)
Glucose-Capillary: 147 mg/dL — ABNORMAL HIGH (ref 65–99)
Glucose-Capillary: 150 mg/dL — ABNORMAL HIGH (ref 65–99)
Glucose-Capillary: 156 mg/dL — ABNORMAL HIGH (ref 65–99)
Glucose-Capillary: 156 mg/dL — ABNORMAL HIGH (ref 65–99)
Glucose-Capillary: 166 mg/dL — ABNORMAL HIGH (ref 65–99)
Glucose-Capillary: 169 mg/dL — ABNORMAL HIGH (ref 65–99)
Glucose-Capillary: 177 mg/dL — ABNORMAL HIGH (ref 65–99)
Glucose-Capillary: 188 mg/dL — ABNORMAL HIGH (ref 65–99)
Glucose-Capillary: 191 mg/dL — ABNORMAL HIGH (ref 65–99)
Glucose-Capillary: 373 mg/dL — ABNORMAL HIGH (ref 65–99)

## 2017-10-13 LAB — BASIC METABOLIC PANEL
Anion gap: 11 (ref 5–15)
Anion gap: 9 (ref 5–15)
BUN: 57 mg/dL — ABNORMAL HIGH (ref 6–20)
BUN: 59 mg/dL — ABNORMAL HIGH (ref 6–20)
CO2: 18 mmol/L — ABNORMAL LOW (ref 22–32)
CO2: 22 mmol/L (ref 22–32)
Calcium: 8.8 mg/dL — ABNORMAL LOW (ref 8.9–10.3)
Calcium: 9.6 mg/dL (ref 8.9–10.3)
Chloride: 107 mmol/L (ref 101–111)
Chloride: 109 mmol/L (ref 101–111)
Creatinine, Ser: 1 mg/dL (ref 0.61–1.24)
Creatinine, Ser: 1.02 mg/dL (ref 0.61–1.24)
GFR calc Af Amer: >60 mL/min (ref >60)
GFR calc Af Amer: >60 mL/min (ref >60)
GFR calc non Af Amer: >60 mL/min (ref >60)
GFR calc non Af Amer: >60 mL/min (ref >60)
Glucose, Bld: 141 mg/dL — ABNORMAL HIGH (ref 65–99)
Glucose, Bld: 170 mg/dL — ABNORMAL HIGH (ref 65–99)
Potassium: 4 mmol/L (ref 3.5–5.1)
Potassium: 4.4 mmol/L (ref 3.5–5.1)
Sodium: 134 mmol/L — ABNORMAL LOW (ref 135–145)
Sodium: 142 mmol/L (ref 135–145)

## 2017-10-13 LAB — CBC
HCT: 48.5 % (ref 39.0–52.0)
Hemoglobin: 16.8 g/dL (ref 13.0–17.0)
MCH: 29.2 pg (ref 26.0–34.0)
MCHC: 34.6 g/dL (ref 30.0–36.0)
MCV: 84.2 fL (ref 78.0–100.0)
Platelets: 243 10*3/uL (ref 150–400)
RBC: 5.76 MIL/uL (ref 4.22–5.81)
RDW: 13 % (ref 11.5–15.5)
WBC: 10.5 10*3/uL (ref 4.0–10.5)

## 2017-10-13 LAB — URINE CULTURE

## 2017-10-13 LAB — TROPONIN I
Troponin I: 0.04 ng/mL (ref <0.03)
Troponin I: 0.06 ng/mL (ref <0.03)

## 2017-10-13 LAB — HEMOGLOBIN A1C
Hgb A1c MFr Bld: 13.4 % — ABNORMAL HIGH (ref 4.8–5.6)
Mean Plasma Glucose: 337.88 mg/dL

## 2017-10-13 MED ORDER — INSULIN GLARGINE 100 UNIT/ML ~~LOC~~ SOLN: 10.0000 [IU] | Freq: Every day | SUBCUTANEOUS | Status: DC

## 2017-10-13 MED ORDER — INSULIN ASPART 100 UNIT/ML ~~LOC~~ SOLN
0.0000 [IU] | Freq: Three times a day (TID) | SUBCUTANEOUS | Status: DC
  Administered 2017-10-13: 3 [IU] via SUBCUTANEOUS
  Administered 2017-10-13: 15 [IU] via SUBCUTANEOUS
  Administered 2017-10-13 – 2017-10-14 (×3): 3 [IU] via SUBCUTANEOUS
  Administered 2017-10-14: 5 [IU] via SUBCUTANEOUS
  Administered 2017-10-15: 3 [IU] via SUBCUTANEOUS
  Administered 2017-10-15: 5 [IU] via SUBCUTANEOUS
  Administered 2017-10-15: 3 [IU] via SUBCUTANEOUS
  Administered 2017-10-16 (×2): 2 [IU] via SUBCUTANEOUS
  Administered 2017-10-16: 3 [IU] via SUBCUTANEOUS

## 2017-10-13 MED ORDER — PRAVASTATIN SODIUM 20 MG PO TABS
20.0000 mg | ORAL_TABLET | Freq: Every day | ORAL | Status: DC
  Administered 2017-10-13 – 2017-10-16 (×4): 20 mg via ORAL
  Filled 2017-10-13 (×4): qty 1

## 2017-10-13 MED ORDER — GLUCERNA SHAKE PO LIQD
237.0000 mL | ORAL | Status: DC
  Administered 2017-10-13 – 2017-10-15 (×2): 237 mL via ORAL
  Filled 2017-10-13 (×3): qty 237

## 2017-10-13 MED ORDER — INSULIN ASPART 100 UNIT/ML ~~LOC~~ SOLN
0.0000 [IU] | Freq: Every day | SUBCUTANEOUS | Status: DC
  Administered 2017-10-15: 3 [IU] via SUBCUTANEOUS

## 2017-10-13 MED ORDER — PREMIER PROTEIN SHAKE
11.0000 [oz_av] | ORAL | Status: DC
  Administered 2017-10-14 – 2017-10-15 (×2): 11 [oz_av] via ORAL
  Filled 2017-10-13 (×5): qty 325.31

## 2017-10-13 MED ORDER — LISINOPRIL 20 MG PO TABS
20.0000 mg | ORAL_TABLET | Freq: Two times a day (BID) | ORAL | Status: DC
  Administered 2017-10-13: 20 mg via ORAL
  Filled 2017-10-13: qty 1

## 2017-10-13 MED ORDER — INSULIN GLARGINE 100 UNIT/ML ~~LOC~~ SOLN
10.0000 [IU] | Freq: Every day | SUBCUTANEOUS | Status: DC
  Administered 2017-10-13 – 2017-10-15 (×3): 10 [IU] via SUBCUTANEOUS
  Filled 2017-10-13 (×6): qty 0.1

## 2017-10-13 MED ORDER — INSULIN GLARGINE 100 UNIT/ML ~~LOC~~ SOLN
10.0000 [IU] | SUBCUTANEOUS | Status: AC
  Administered 2017-10-13: 10 [IU] via SUBCUTANEOUS
  Filled 2017-10-13: qty 0.1

## 2017-10-13 NOTE — Progress Notes (Signed)
At 0306 text page NP BMET results.

## 2017-10-13 NOTE — Progress Notes (Signed)
PROGRESS NOTE    Shane [name]  NIO:[id number] DOB: 05/12/1936 DOA: 10/12/2017 PCP: Tammi Sou, MD     Brief Narrative:  Shane [name] is a 82 y.o. male with medical history significant of diabetes, hypertension, urothelial carcinoma with ileal conduit admitted with generalized weakness multiple falls and increasing sleepiness and thirst.  Patient has a history of diabetes but he stopped taking diabetes meds for years ago.  Saw his primary care physician and he was restarted on 15 units of Lantus 10/07/2017 but his blood sugar readings still remained high at 421-487.  Family increase the Lantus dose to 23 units. In the ED, his blood sugar was 349 with elevated anion gap and ketones in urine. He was admitted for DKA and started on insulin drip.  Assessment & Plan:   Active Problems:   DKA (diabetic ketoacidoses) (HCC)   DKA -Treated with insulin drip, blood sugars improved with anion gap closed -Ha1c 13.4  -Started on Lantus, sliding scale insulin -DM coordinator consulted  Urinary tract infection, present on admission -Urine culture pending -Continue Rocephin  Acute metabolic encephalopathy -Wife reports that in the past 2 weeks, patient has been very confused -CT head negative for acute findings -Continue to treat urinary tract infection and DKA as above  AKI  -Resolved with IVF   Essential hypertension -Continue lisinopril  Hyperlipidemia -Continue statin   DVT prophylaxis: Lovenox Code Status: Full Family Communication: Wife over the phone Disposition Plan: Pending improvement in blood sugars, treatment UTI, PT eval   Consultants:   None  Procedures:   None   Antimicrobials:  Anti-infectives (From admission, onward)   Start     Dose/Rate Route Frequency Ordered Stop   10/13/17 1400  cefTRIAXone (ROCEPHIN) 1 g in sodium chloride 0.9 % 100 mL IVPB     1 g 200 mL/hr over 30 Minutes Intravenous Every 24 hours 10/12/17 1733     10/12/17 1430   cefTRIAXone (ROCEPHIN) 1 g in sodium chloride 0.9 % 100 mL IVPB     1 g 200 mL/hr over 30 Minutes Intravenous  Once 10/12/17 1427 10/12/17 1503       Subjective: Patient confused this morning, unable to tell me where he is, answer appropriate questions about why came to the hospital.  He has no physical complaints this morning, denies any pain.  Objective: Vitals:   10/13/17 0500 10/13/17 0600 10/13/17 0800 10/13/17 0900  BP: (!) 126/91 (!) 116/59  (!) 128/55  Pulse:      Resp: (!) 21 13  15   Temp:   (!) 96.9 F (36.1 C)   TempSrc:   Axillary   SpO2: 93% 97%  95%  Weight:        Intake/Output Summary (Last 24 hours) at 10/13/2017 1344 Last data filed at 10/13/2017 0800 Gross per 24 hour  Intake 1638.35 ml  Output 1550 ml  Net 88.35 ml   Filed Weights   10/13/17 0356  Weight: 84.9 kg (187 lb 2.7 oz)    Examination:  General exam: Appears calm and comfortable  Respiratory system: Clear to auscultation. Respiratory effort normal. Cardiovascular system: S1 & S2 heard, RRR. No JVD, murmurs, rubs, gallops or clicks. No pedal edema. Gastrointestinal system: Abdomen is nondistended, soft and nontender. No organomegaly or masses felt. Normal bowel sounds heard. Central nervous system: Alert but not oriented to place, year, situation. Nonfocal neuro exam  Extremities: Symmetric  Skin: No rashes, lesions or ulcers  Data Reviewed: I have personally reviewed following labs  and imaging studies  CBC: Recent Labs  Lab 10/12/17 1244 10/13/17 0517  WBC 13.6* 10.5  HGB 16.4 16.8  HCT 45.0 48.5  MCV 84.1 84.2  PLT 372 829   Basic Metabolic Panel: Recent Labs  Lab 10/12/17 1244 10/12/17 1910 10/12/17 2359 10/13/17 0517  NA 130* 137 142 134*  K 4.2 3.3* 4.4 4.0  CL 99* 107 109 107  CO2 16* 19* 22 18*  GLUCOSE 490* 213* 141* 170*  BUN 74* 65* 59* 57*  CREATININE 1.26* 1.07 1.00 1.02  CALCIUM 9.7 9.6 9.6 8.8*  MG  --  2.4  --   --    GFR: Estimated Creatinine  Clearance: 60.1 mL/min (by C-G formula based on SCr of 1.02 mg/dL). Liver Function Tests: No results for input(s): AST, ALT, ALKPHOS, BILITOT, PROT, ALBUMIN in the last 168 hours. No results for input(s): LIPASE, AMYLASE in the last 168 hours. No results for input(s): AMMONIA in the last 168 hours. Coagulation Profile: No results for input(s): INR, PROTIME in the last 168 hours. Cardiac Enzymes: Recent Labs  Lab 10/12/17 1244 10/12/17 1910 10/12/17 2359 10/13/17 0517  TROPONINI 0.05* 0.05* 0.06* 0.04*   BNP (last 3 results) No results for input(s): PROBNP in the last 8760 hours. HbA1C: Recent Labs    10/13/17 0729  HGBA1C 13.4*   CBG: Recent Labs  Lab 10/13/17 0420 10/13/17 0532 10/13/17 0638 10/13/17 0742 10/13/17 1136  GLUCAP 156* 177* 166* 191* 373*   Lipid Profile: No results for input(s): CHOL, HDL, LDLCALC, TRIG, CHOLHDL, LDLDIRECT in the last 72 hours. Thyroid Function Tests: No results for input(s): TSH, T4TOTAL, FREET4, T3FREE, THYROIDAB in the last 72 hours. Anemia Panel: No results for input(s): VITAMINB12, FOLATE, FERRITIN, TIBC, IRON, RETICCTPCT in the last 72 hours. Sepsis Labs: No results for input(s): PROCALCITON, LATICACIDVEN in the last 168 hours.  Recent Results (from the past 240 hour(s))  MRSA PCR Screening     Status: None   Collection Time: 10/12/17  6:45 PM  Result Value Ref Range Status   MRSA by PCR NEGATIVE NEGATIVE Final    Comment:        The GeneXpert MRSA Assay (FDA approved for NASAL specimens only), is one component of a comprehensive MRSA colonization surveillance program. It is not intended to diagnose MRSA infection nor to guide or monitor treatment for MRSA infections. Performed at [facility], [place] [address], [place], [place] [address]        Radiology Studies: Dg Chest 2 View  Result Date: 10/12/2017 CLINICAL DATA:  Elevated blood glucose EXAM: CHEST - 2 VIEW COMPARISON:  10/09/2017 FINDINGS:  Heart size and vascularity normal. Negative for pneumonia. Lungs are clear. IMPRESSION: No active cardiopulmonary disease. Electronically Signed   By: Franchot Gallo M.D.   On: 10/12/2017 15:00   Ct Head Wo Contrast  Result Date: 10/12/2017 CLINICAL DATA:  82 year old male with recent increased weakness and thirst. Hyperglycemia. Altered mental status. EXAM: CT HEAD WITHOUT CONTRAST TECHNIQUE: Contiguous axial images were obtained from the base of the skull through the vertex without intravenous contrast. COMPARISON:  No prior brain imaging. FINDINGS: Brain: Mild dystrophic calcifications in the bilateral globus pallidus and deep cerebellar nuclei. Cerebral volume is within normal limits for age. Mild dural calcifications. No midline shift, ventriculomegaly, mass effect, evidence of mass lesion, intracranial hemorrhage or evidence of cortically based acute infarction. Gray-white matter differentiation is within normal limits throughout the brain. No cortical encephalomalacia. Vascular: Calcified atherosclerosis at the skull base. No  suspicious intracranial vascular hyperdensity. There is intracranial artery tortuosity, including the dominant appearing distal left vertebral artery. Skull: No acute osseous abnormality identified. Sinuses/Orbits: Hyperplastic and well pneumatized. Other: No acute orbit or scalp soft tissue finding. IMPRESSION: No acute intracranial abnormality. Normal for age non contrast CT appearance of the brain. Electronically Signed   By: Genevie Ann M.D.   On: 10/12/2017 14:33      Scheduled Meds: . enoxaparin (LOVENOX) injection  40 mg Subcutaneous Q24H  . feeding supplement (GLUCERNA SHAKE)  237 mL Oral Q24H  . insulin aspart  0-15 Units Subcutaneous TID WC  . insulin aspart  0-5 Units Subcutaneous QHS  . insulin glargine  10 Units Subcutaneous QHS  . lisinopril  20 mg Oral BID  . pravastatin  20 mg Oral Daily  . protein supplement shake  11 oz Oral Q24H   Continuous Infusions: .  cefTRIAXone (ROCEPHIN)  IV       LOS: 1 day    Time spent: 30 minutes   Dessa Phi, DO [facility] [email or web address] Password [email or web address] 10/13/2017, 1:44 PM

## 2017-10-13 NOTE — Progress Notes (Signed)
Inpatient Diabetes Program Recommendations  AACE/ADA: New Consensus Statement on Inpatient Glycemic Control (2015)  Target Ranges:  Prepandial:   less than 140 mg/dL      Peak postprandial:   less than 180 mg/dL (1-2 hours)      Critically ill patients:  140 - 180 mg/dL   Lab Results  Component Value Date   GLUCAP 373 (H) 10/13/2017   HGBA1C 13.4 (H) 10/13/2017    Review of Glycemic Control  Diabetes history: DM2 Outpatient Diabetes medications: Lantus 23 units QHS, metformin 1000 mg bid (not taking) Current orders for Inpatient glycemic control: Novolog 0-15 units tidwc and hs, Lantus 10 units QHS  HgbA1C - 13.4% - uncontrolled  Inpatient Diabetes Program Recommendations:     Increase Lantus 15 units QHS Add Novolog 5 units tidwc for meal coverage insulin  Will need to go home on both basal and bolus insulin.  Continue to follow.   Thank you. Lorenda Peck, RD, LDN, CDE Inpatient Diabetes Coordinator [phone]

## 2017-10-13 NOTE — Evaluation (Signed)
Physical Therapy Evaluation Patient Details Name: Shane [name] MRN: [id number] DOB: 02/22/1936 Today's Date: 10/13/2017   History of Present Illness  82 yo male admitted with DKA, weakness, UTI, confusion. Hx of Dm, HTN, falls, urothelial cancer, CAD.   Clinical Impression  On eval, pt was Min guard assist for mobility. He walked ~135 feet with a RW. Pt participated well with therapy. He tolerated activity well. Discussed d/c plan with wife-pt will return home with her. She politely declines HHPT f/u. Recommend RW use for safe ambulation. Will follow and progress activity as tolerated.     Follow Up Recommendations No PT follow up(wife declines HH PT f/u)    Equipment Recommendations  Rolling walker with 5" wheels    Recommendations for Other Services       Precautions / Restrictions Precautions Precautions: Fall Precaution Comments: urostomy Restrictions Weight Bearing Restrictions: No      Mobility  Bed Mobility Overal bed mobility: Needs Assistance Bed Mobility: Supine to Sit     Supine to sit: Min guard;HOB elevated     General bed mobility comments: Increased time. Close guard for safety. Pt relied on bedrail   Transfers Overall transfer level: Needs assistance Equipment used: Rolling walker (2 wheeled) Transfers: Sit to/from Stand Sit to Stand: Min guard;From elevated surface         General transfer comment: Close guard for safety. VCs safety, hand placement. Increased time.   Ambulation/Gait Ambulation/Gait assistance: Min guard Ambulation Distance (Feet): 135 Feet Assistive device: Rolling walker (2 wheeled) Gait Pattern/deviations: Step-through pattern     General Gait Details: close guard for safety.   Stairs            Wheelchair Mobility    Modified Rankin (Stroke Patients Only)       Balance Overall balance assessment: History of Falls;Needs assistance         Standing balance support: Bilateral upper extremity  supported Standing balance-Leahy Scale: Poor                               Pertinent Vitals/Pain Pain Assessment: No/denies pain    Home Living Family/patient expects to be discharged to:: Private residence Living Arrangements: Spouse/significant other Available Help at Discharge: Family Type of Home: House Home Access: Stairs to enter Entrance Stairs-Rails: Right Entrance Stairs-Number of Steps: 3 Home Layout: One level Home Equipment: None      Prior Function Level of Independence: Independent               Hand Dominance        Extremity/Trunk Assessment   Upper Extremity Assessment Upper Extremity Assessment: Generalized weakness    Lower Extremity Assessment Lower Extremity Assessment: Generalized weakness    Cervical / Trunk Assessment Cervical / Trunk Assessment: Normal  Communication   Communication: No difficulties  Cognition Arousal/Alertness: Awake/alert Behavior During Therapy: WFL for tasks assessed/performed Overall Cognitive Status: Within Functional Limits for tasks assessed                                        General Comments      Exercises     Assessment/Plan    PT Assessment Patient needs continued PT services  PT Problem List Decreased strength;Decreased mobility;Decreased activity tolerance;Decreased balance;Decreased knowledge of use of DME       PT Treatment Interventions DME  instruction;Gait training;Therapeutic activities;Therapeutic exercise;Patient/family education;Balance training;Functional mobility training    PT Goals (Current goals can be found in the Care Plan section)  Acute Rehab PT Goals Patient Stated Goal: home PT Goal Formulation: With patient/family Time For Goal Achievement: 10/27/17 Potential to Achieve Goals: Good    Frequency Min 3X/week   Barriers to discharge        Co-evaluation               AM-PAC PT "6 Clicks" Daily Activity  Outcome Measure  Difficulty turning over in bed (including adjusting bedclothes, sheets and blankets)?: A Little Difficulty moving from lying on back to sitting on the side of the bed? : A Little Difficulty sitting down on and standing up from a chair with arms (e.g., wheelchair, bedside commode, etc,.)?: A Little Help needed moving to and from a bed to chair (including a wheelchair)?: A Little Help needed walking in hospital room?: A Little Help needed climbing 3-5 steps with a railing? : A Little 6 Click Score: 18    End of Session Equipment Utilized During Treatment: Gait belt Activity Tolerance: Patient tolerated treatment well Patient left: in chair;with call bell/phone within reach   PT Visit Diagnosis: Muscle weakness (generalized) (M62.81);History of falling (Z91.81)    Time: 2482-5003 PT Time Calculation (min) (ACUTE ONLY): 21 min   Charges:   PT Evaluation $PT Eval Moderate Complexity: 1 Mod     PT G Codes:          Weston Anna, MPT Pager: [phone]

## 2017-10-13 NOTE — Progress Notes (Signed)
Initial Nutrition Assessment  DOCUMENTATION CODES:   Non-severe (moderate) malnutrition in context of acute illness/injury  INTERVENTION:  - Will d/c Ensure Enlive. - Will order Glucerna Shake once/day, this supplement provides 220 kcal and 10 grams of protein. - Will order Premier Protein once/day, this supplement provides 160 kcal and 30 grams of protein.  - Continue to encourage PO intakes.  - Provide diet education at follow-up/prior to d/c.   NUTRITION DIAGNOSIS:   Moderate Malnutrition related to acute illness(uncontrolled DM/hyperglycemia) as evidenced by mild muscle depletion, mild fat depletion, moderate muscle depletion, percent weight loss.  GOAL:   Patient will meet greater than or equal to 90% of their needs  MONITOR:   PO intake, Supplement acceptance, Weight trends, Labs  REASON FOR ASSESSMENT:   Malnutrition Screening Tool  ASSESSMENT:   82 y.o. male with medical history significant of DM, HTN, urothelial carcinoma with ileal conduit admitted with generalized weakness, multiple falls, and increasing sleepiness and thirst.  Patient has a history of diabetes but he stopped taking diabetes medications 4 years ago. He saw his primary care physician and was restarted on 15 units of Lantus 10/07/2017. Family increased the Lantus dose to 23 units, but his blood sugar readings still remained high at 421-487 mg/dL on day of admission (4/1). His appetite has been poor.     BMI indicates overweight status, appropriate for age. No intakes documented since admission. Pt reports that he had an Ensure at breakfast and does not recall consuming anything else around that time. He denied abdominal pain or nausea since admission. He was very drowsy during RD visit and his daughter and wife, who were at bedside, provided the majority of information.  Pt began to decline around his birthday ([birth year]) but has had a more rapid/noticable decline over the past 5-7 days. He has been much more  drowsy, limited ambulation, decreased appetite with mainly only consuming homemade protein shakes. Pt had been more thirsty than hungry and only wanting sweet items rather than savory items. Explained the rationale for this to family.   He does not have lower dentures and needs softer foods. Discussed the importance of protein with all meals and snacks. Also discussed the importance of adequate carbohydrate to avoid hypoglycemia. Discussed protein shake options for inpatient and outpatient use in detail.   Notes indicate uncertainty of Type 1 vs Type 2 DM at this time; previous dx of Type 2. Once more information is known, RD will provide diet education. Informed family of this and they are very appreciative.   Physical assessment outlined below. Family confirms that they have noticed weight loss in the past few days to weeks. Per chart review, pt has lost 5 lbs (2.6% body weight) in the past 5 days. This is significant for time frame.  Medications reviewed; sliding scale Novolog, 10 units Lantus/day, 10 mEq IV KCl x2 runs yesterday.  Labs reviewed; CBGs: 137-191 mg/dL mainly since midnight with one outlier of 373 mg/dL, Na: 134 mmol/L, BUN: 57 mg/dL, Ca: 8.8 mg/dL.        NUTRITION - FOCUSED PHYSICAL EXAM:    Most Recent Value  Orbital Region  No depletion  Upper Arm Region  Mild depletion  Thoracic and Lumbar Region  Unable to assess  Buccal Region  No depletion  Temple Region  Mild depletion  Clavicle Bone Region  Moderate depletion  Clavicle and Acromion Bone Region  Mild depletion  Scapular Bone Region  No depletion  Dorsal Hand  No depletion  Patellar  Region  Unable to assess  Anterior Thigh Region  Unable to assess  Posterior Calf Region  Unable to assess  Edema (RD Assessment)  Unable to assess  Hair  Unable to assess  Eyes  Unable to assess  Mouth  Unable to assess  Skin  Unable to assess  Nails  Unable to assess       Diet Order:  Diet Carb Modified Fluid  consistency: Thin; Room service appropriate? Yes  EDUCATION NEEDS:   Not appropriate for education at this time  Skin:  Skin Assessment: Reviewed RN Assessment  Last BM:  PTA/unknown  Height:   Ht Readings from Last 1 Encounters:  10/12/17 5\' 7"  (1.702 m)    Weight:   Wt Readings from Last 1 Encounters:  10/13/17 187 lb 2.7 oz (84.9 kg)    Ideal Body Weight:  67.27 kg  BMI:  Body mass index is 29.32 kg/m.  Estimated Nutritional Needs:   Kcal:  1700-1950 (20-23 kcal/kg)  Protein:  85-100 grams (1-1.2 grams/kg)  Fluid:  >/= 2 L/day     Jarome Matin, MS, RD, LDN, [facility] In[facility] Clinical Dietitian Pager # [phone] After hours/weekend pager # [phone]

## 2017-10-13 NOTE — Progress Notes (Signed)
At [place] text page NP BMET results.

## 2017-10-14 ENCOUNTER — Inpatient Hospital Stay (HOSPITAL_COMMUNITY): Payer: Medicare Other

## 2017-10-14 DIAGNOSIS — G9341 Metabolic encephalopathy: Secondary | ICD-10-CM

## 2017-10-14 DIAGNOSIS — I6300 Cerebral infarction due to thrombosis of unspecified precerebral artery: Secondary | ICD-10-CM

## 2017-10-14 DIAGNOSIS — I959 Hypotension, unspecified: Secondary | ICD-10-CM

## 2017-10-14 DIAGNOSIS — E1165 Type 2 diabetes mellitus with hyperglycemia: Secondary | ICD-10-CM

## 2017-10-14 LAB — BASIC METABOLIC PANEL
Anion gap: 10 (ref 5–15)
BUN: 55 mg/dL — ABNORMAL HIGH (ref 6–20)
CO2: 17 mmol/L — ABNORMAL LOW (ref 22–32)
Calcium: 8.4 mg/dL — ABNORMAL LOW (ref 8.9–10.3)
Chloride: 106 mmol/L (ref 101–111)
Creatinine, Ser: 1.09 mg/dL (ref 0.61–1.24)
GFR calc Af Amer: >60 mL/min (ref >60)
GFR calc non Af Amer: >60 mL/min (ref >60)
Glucose, Bld: 164 mg/dL — ABNORMAL HIGH (ref 65–99)
Potassium: 4.1 mmol/L (ref 3.5–5.1)
Sodium: 133 mmol/L — ABNORMAL LOW (ref 135–145)

## 2017-10-14 LAB — GLUCOSE, CAPILLARY
Glucose-Capillary: 139 mg/dL — ABNORMAL HIGH (ref 65–99)
Glucose-Capillary: 168 mg/dL — ABNORMAL HIGH (ref 65–99)
Glucose-Capillary: 172 mg/dL — ABNORMAL HIGH (ref 65–99)
Glucose-Capillary: 204 mg/dL — ABNORMAL HIGH (ref 65–99)
Glucose-Capillary: 232 mg/dL — ABNORMAL HIGH (ref 65–99)

## 2017-10-14 LAB — CBC
HCT: 39.1 % (ref 39.0–52.0)
Hemoglobin: 13.6 g/dL (ref 13.0–17.0)
MCH: 29.6 pg (ref 26.0–34.0)
MCHC: 34.8 g/dL (ref 30.0–36.0)
MCV: 85.2 fL (ref 78.0–100.0)
Platelets: 281 10*3/uL (ref 150–400)
RBC: 4.59 MIL/uL (ref 4.22–5.81)
RDW: 13.1 % (ref 11.5–15.5)
WBC: 8.8 10*3/uL (ref 4.0–10.5)

## 2017-10-14 LAB — TSH: TSH: 1.084 u[IU]/mL (ref 0.350–4.500)

## 2017-10-14 LAB — VITAMIN B12: Vitamin B-12: 946 pg/mL — ABNORMAL HIGH (ref 180–914)

## 2017-10-14 MED ORDER — SODIUM CHLORIDE 0.9 % IV BOLUS
500.0000 mL | Freq: Once | INTRAVENOUS | Status: AC
  Administered 2017-10-14: 500 mL via INTRAVENOUS

## 2017-10-14 MED ORDER — ASPIRIN 300 MG RE SUPP
300.0000 mg | Freq: Every day | RECTAL | Status: DC
  Filled 2017-10-14: qty 1

## 2017-10-14 MED ORDER — SODIUM CHLORIDE 0.9 % IV BOLUS
500.0000 mL | Freq: Once | INTRAVENOUS | Status: AC
Start: 2017-10-14 — End: 2017-10-14
  Administered 2017-10-14: 500 mL via INTRAVENOUS

## 2017-10-14 MED ORDER — ASPIRIN 325 MG PO TABS
325.0000 mg | ORAL_TABLET | Freq: Every day | ORAL | Status: DC
  Administered 2017-10-14 – 2017-10-16 (×3): 325 mg via ORAL
  Filled 2017-10-14 (×3): qty 1

## 2017-10-14 MED ORDER — VITAMIN B-1 100 MG PO TABS
100.0000 mg | ORAL_TABLET | Freq: Every day | ORAL | Status: DC
  Administered 2017-10-14 – 2017-10-16 (×3): 100 mg via ORAL
  Filled 2017-10-14 (×3): qty 1

## 2017-10-14 MED ORDER — STROKE: EARLY STAGES OF RECOVERY BOOK
Freq: Once | Status: DC
  Filled 2017-10-14: qty 1

## 2017-10-14 MED ORDER — POLYETHYLENE GLYCOL 3350 17 G PO PACK
17.0000 g | PACK | Freq: Every day | ORAL | Status: DC
  Administered 2017-10-14 – 2017-10-15 (×2): 17 g via ORAL
  Filled 2017-10-14 (×3): qty 1

## 2017-10-14 NOTE — Progress Notes (Addendum)
[facility] PROGRESS NOTE  Shane [name] PXT:[id number] DOB: Aug 30, 1935 DOA: 10/12/2017  PCP: Tammi Sou, MD  Brief History/Interval Summary: 82 year old Caucasian male with past medical history of urothelial carcinoma with ileal conduit was admitted for further evaluation of generalized weakness, multiple falls, increased sleepiness and thirst.  Found to have diabetic ketoacidosis.  Was hospitalized for further management.  Apparently has not been taking his diabetes medications for many months.  Reason for Visit: Uncontrolled diabetes.  Acute metabolic encephalopathy.  Consultants: None  Procedures: None  Antibiotics: Ceftriaxone  Subjective/Interval History: Patient confused.  His wife is at the bedside.  Denies any pain issues.  No headaches.  ROS: Denies any nausea or vomiting  Objective:  Vital Signs  Vitals:   10/14/17 0224 10/14/17 0344 10/14/17 0400 10/14/17 1330  BP: (!) 84/54 (!) 90/56 (!) 88/52 (!) 85/50  Pulse:  64    Resp:  18    Temp:  97.8 F (36.6 C)    TempSrc:  Oral    SpO2:  96%    Weight:      Height:        Intake/Output Summary (Last 24 hours) at 10/14/2017 1453 Last data filed at 10/13/2017 2124 Gross per 24 hour  Intake 100 ml  Output 640 ml  Net -540 ml   Filed Weights   10/13/17 0356 10/13/17 1035  Weight: 84.9 kg (187 lb 2.7 oz) 85 kg (187 lb 6.3 oz)    General appearance: alert, cooperative, appears stated age, distracted and no distress Head: Normocephalic, without obvious abnormality, atraumatic Resp: clear to auscultation bilaterally Cardio: regular rate and rhythm, S1, S2 normal, no murmur, click, rub or gallop GI: soft, non-tender; bowel sounds normal; no masses,  no organomegaly Extremities: extremities normal, atraumatic, no cyanosis or edema Pulses: 2+ and symmetric Neurologic: He is awake alert.  Disoriented to place your month.  He knew his wife.  He knew the president.  No facial asymmetry.  Cranial  nerves II through XII intact.  Motor strength equal bilateral upper and lower extremities.  Poor recall.  Lab Results:  Data Reviewed: I have personally reviewed following labs and imaging studies  CBC: Recent Labs  Lab 10/12/17 1244 10/13/17 0517 10/14/17 0413  WBC 13.6* 10.5 8.8  HGB 16.4 16.8 13.6  HCT 45.0 48.5 39.1  MCV 84.1 84.2 85.2  PLT 372 243 627    Basic Metabolic Panel: Recent Labs  Lab 10/12/17 1244 10/12/17 1910 10/12/17 2359 10/13/17 0517 10/14/17 0413  NA 130* 137 142 134* 133*  K 4.2 3.3* 4.4 4.0 4.1  CL 99* 107 109 107 106  CO2 16* 19* 22 18* 17*  GLUCOSE 490* 213* 141* 170* 164*  BUN 74* 65* 59* 57* 55*  CREATININE 1.26* 1.07 1.00 1.02 1.09  CALCIUM 9.7 9.6 9.6 8.8* 8.4*  MG  --  2.4  --   --   --     GFR: Estimated Creatinine Clearance: 55.3 mL/min (by C-G formula based on SCr of 1.09 mg/dL).   Cardiac Enzymes: Recent Labs  Lab 10/12/17 1244 10/12/17 1910 10/12/17 2359 10/13/17 0517  TROPONINI 0.05* 0.05* 0.06* 0.04*    HbA1C: Recent Labs    10/13/17 0729  HGBA1C 13.4*    CBG: Recent Labs  Lab 10/13/17 1710 10/13/17 2119 10/14/17 0231 10/14/17 0749 10/14/17 1211  GLUCAP 169* 188* 168* 172* 232*     Recent Results (from the past 240 hour(s))  Urine culture     Status: Abnormal  Collection Time: 10/12/17 12:57 PM  Result Value Ref Range Status   Specimen Description   Final    URINE, RANDOM Performed at [place] [address], [place], [place] [address]    Special Requests   Final    NONE Performed at [facility], [place] [address] [place], [place] [address]    Culture MULTIPLE SPECIES PRESENT, SUGGEST RECOLLECTION (A)  Final   Report Status 10/13/2017 FINAL  Final  MRSA PCR Screening     Status: None   Collection Time: 10/12/17  6:45 PM  Result Value Ref Range Status   MRSA by PCR NEGATIVE NEGATIVE Final    Comment:        The GeneXpert MRSA Assay (FDA approved for  NASAL specimens only), is one component of a comprehensive MRSA colonization surveillance program. It is not intended to diagnose MRSA infection nor to guide or monitor treatment for MRSA infections. Performed at [facility], [place] [address], [place],  [address]       Radiology Studies: Dg Chest 2 View  Result Date: 10/12/2017 CLINICAL DATA:  Elevated blood glucose EXAM: CHEST - 2 VIEW COMPARISON:  10/09/2017 FINDINGS: Heart size and vascularity normal. Negative for pneumonia. Lungs are clear. IMPRESSION: No active cardiopulmonary disease. Electronically Signed   By: Franchot Gallo M.D.   On: 10/12/2017 15:00     Medications:  Scheduled: . enoxaparin (LOVENOX) injection  40 mg Subcutaneous Q24H  . feeding supplement (GLUCERNA SHAKE)  237 mL Oral Q24H  . insulin aspart  0-15 Units Subcutaneous TID WC  . insulin aspart  0-5 Units Subcutaneous QHS  . insulin glargine  10 Units Subcutaneous QHS  . polyethylene glycol  17 g Oral Daily  . pravastatin  20 mg Oral Daily  . protein supplement shake  11 oz Oral Q24H  . thiamine  100 mg Oral Daily   Continuous: . cefTRIAXone (ROCEPHIN)  IV 1 g (10/14/17 1235)  . sodium chloride     PRN:  Assessment/Plan:  Active Problems:   DKA (diabetic ketoacidoses) (HCC)   Malnutrition of moderate degree    Uncontrolled diabetes presenting with DKA DKA resolved.  Transition to subcutaneous insulin.  HbA1c 13.4.  Reason for patient's noncompliance is not entirely clear.  Patient could have underlying cognitive deficits.  Urinary tract infection Urine cultures without any growth.  He is on ceftriaxone.  Transition to oral.  Hypotension Blood pressure noted to be low.  He was noted to be on lisinopril.  His wife tells me that he usually takes his blood pressure medication only if blood pressure is high.  We will stop lisinopril.  Fluid bolus.  Patient does not appear to be symptomatic.  Acute metabolic  encephalopathy Unclear if patient has underlying cognitive impairment.  Patient does not exhibit any focal neurological deficits.  CT scan did not show any acute process.  Encephalopathy could be due to acute illness which is taking a while to recover.  Proceed with MRI brain to rule out other etiology.  Vitamin B12, TSH, RPR, folate levels.  Give thiamine.  Patient's wife denies any significant history of alcohol use.  Acute kidney injury Resolved with IV fluids  Hyperlipidemia Continue with statin  ADDENDUM MRI shows punctate infarct in the left cerebellum.  Discussed with neurology, Dr. Leonel Ramsay.  Recommends further stroke workup.  Will transfer to [facility].  Discussed with patient and his wife as well.  Blood pressure has not improved much.  Blood  pressure repeated and was noted to be 194 systolic.  Continue to monitor.  Fluid bolus.  DVT Prophylaxis: Lovenox    Code Status: Full code Family Communication: Discussed with patient and his wife Disposition Plan: Management as outlined above.  Await MRI brain.  Await blood pressure to improve.    LOS: 2 days   [facility] Pager [phone] 10/14/2017, 2:53 PM  If 7PM-7AM, please contact night-coverage at [email or web address], password [facility]

## 2017-10-14 NOTE — Significant Event (Signed)
Rapid Response Event Note  Overview: Time Called: 1655 Arrival Time: 1700 Event Type: Neurologic Notified by [facility] RN that patient had MRI and results revealed stroke. MD Maryland Pink wanted NIH scale done along with stroke swallow screen.  Initial Focused Assessment: Neuro: Pupils 1-2+, brisk, round, reactive to light bilaterally, follows commands, alert x3, could not correctly verbalize the current year. No apparent weakness from left and right upper extremities, or lower extremities. See NIH scale. Patient did not have any symptoms of pain. Cardiac: NSR, s1 and s2 heard upon auscultation, 2+ pulses palpated in radial and pedal locations, see vital signs for BP. Pulmonary: Breath sounds clear and diminished throughout lung fields. No changes in breath sounds after swallowing examination. O2 Sats 97% on RA.   Interventions: NIH scale Stroke Swallow screen   Plan of Care (if not transferred): Continue to follow orders per MD, call ICU Charge for NIH scale per shift, call rapid response if patient has changes with baseline neurological status or changes in vital signs.  Event Summary:   at      at          [place]

## 2017-10-14 NOTE — Progress Notes (Signed)
OT Cancellation Note  Patient Details Name: Shane [name] MRN: [id number] DOB: 08-29-1935   Cancelled Treatment:    Reason Eval/Treat Not Completed: Other (comment).  Pt's BP remains low 85/50 in supine.  Oriented to self and general place (not birthday).  Will check back, likely tomorrow.  [place] 10/14/2017, 1:34 PM

## 2017-10-14 NOTE — Consult Note (Addendum)
Neurology Consultation  Reason for Consult: Stroke Referring Physician: Dr. [place] Pink  CC: Altered mental status  History is obtained from: Patient, chart  HPI: Shane [name] is a 82 y.o. male who has a past medical history of coronary artery disease, diabetes with on clear compliance, bladder cancer with history of chemotherapy, who was brought in to the emergency room at [facility] on 10/12/2017 with complaints of generalized weakness.  He was complaining of generalized weakness, multiple falls, increasing sleepiness and increasing thirst.  He stopped taking his diabetic meds a year ago.  When he went to his primary care, the blood sugar levels were high, he was given some insulin and sent to the ER.  He was evaluated in the emergency room where he continued to have hyperglycemia, hyponatremia, possible acute kidney injury.  He was found to be in DKA.  He was treated for DKA. He continued to remain somewhat confused and encephalopathic.  An MRI was pursued as a part of encephalopathy workup that revealed punctate area of restricted diffusion in the left cerebellum and also concern for distal right vertebral artery occlusion, for which she was transferred to [facility] for further workup. He remains hypotensive and that is also being worked up by the primary team.  Possible etiologies are urinary tract infection.  LKW: At least 2 days ago tpa given?: no, outside the window Premorbid modified Rankin scale (mRS): *Patient is a poor historian but is probably modified Rankin 2.  No family at bedside at this time   ROS: ROS was performed and is negative except as noted in the HPI.   Past Medical History:  Diagnosis Date  . Abnormal EKG 2014   This led to stress test which was abnl, which led to cardiology referral: cath showed small vessel dz of 2nd diag branch with 70-80% stenosis--preserved LV function  . Bilateral renal cysts    Non-complex (CT 03/2016): no enhancement,  coarse calcifications, mass effect does give false appearance of hydro on non-contract series (followed by urol).  . Bladder cancer ([place]) 03/2016   Invasive high grade urothelial carcinoma.  No mets at dx.  Cystoscopy + bx and partial resection of tumor in hosp when admitted for gross hematuria and AUR.  Neoadj chemo 06/2016, then got cystoprostatectomy. (Urostomy--ileal conduit urinary diversion with refluxing anastamoses--has RLQ urostomy)-gets routine surveillance by urol (Dr. Tresa Moore), most recent 04/2017--no sign of recurrence--obs status  . CAD (coronary artery disease)    small vessel dz x 1 vessel on cath 2014: med mgmt  . Chronic calcific pancreatitis ([place]) 03/2016   CT: also small cyst in head of pancreas that needs 6 mo f/u imaging (09/2016)  . Diabetes mellitus without complication ([place])   . Heart murmur, systolic    Noted in prior PCP's records.  I recommended echocardiogram 07/2016 but pt declined.  [name] History of Bell's palsy   . History of chemotherapy   . Hyperlipidemia   . Hypertension   . Pancreas cyst 03/2016   Repeat imaging with MRI pancreas protocol 09/2016  . Umbilical hernia    Repaired when he got his bladder surgery.    Family History  Problem Relation Age of Onset  . Diabetes Mellitus I Mother   . Diabetes Mellitus I Sister    Social History:   reports that he has quit smoking. He quit after 3.00 years of use. He has never used smokeless tobacco. He reports that he does not drink alcohol or use drugs. He is  retired. Used to work at [employer] Does not smoke or drink. Does not use illicit drugs.   Medications  Current Facility-Administered Medications:  .   stroke: mapping our early stages of recovery book, , Does not apply, Once, Bonnielee Haff, MD .  aspirin suppository 300 mg, 300 mg, Rectal, Daily **OR** aspirin tablet 325 mg, 325 mg, Oral, Daily, Bonnielee Haff, MD, 325 mg at 10/14/17 1809 .  cefTRIAXone (ROCEPHIN) 1 g in sodium chloride 0.9 % 100 mL IVPB, 1  g, Intravenous, Q24H, Dessa Phi, DO, Stopped at 10/14/17 1530 .  enoxaparin (LOVENOX) injection 40 mg, 40 mg, Subcutaneous, Q24H, Dessa Phi, DO, 40 mg at 10/13/17 2216 .  feeding supplement (GLUCERNA SHAKE) (GLUCERNA SHAKE) liquid 237 mL, 237 mL, Oral, Q24H, Choi, Jennifer, DO, 237 mL at 10/13/17 1504 .  insulin aspart (novoLOG) injection 0-15 Units, 0-15 Units, Subcutaneous, TID WC, Dessa Phi, DO, 5 Units at 10/14/17 1810 .  insulin aspart (novoLOG) injection 0-5 Units, 0-5 Units, Subcutaneous, QHS, Choi, Jennifer, DO .  insulin glargine (LANTUS) injection 10 Units, 10 Units, Subcutaneous, QHS, Dessa Phi, DO, 10 Units at 10/13/17 2230 .  polyethylene glycol (MIRALAX / GLYCOLAX) packet 17 g, 17 g, Oral, Daily, Leana Gamer, MD, 17 g at 10/14/17 1139 .  pravastatin (PRAVACHOL) tablet 20 mg, 20 mg, Oral, Daily, Dessa Phi, DO, 20 mg at 10/14/17 1124 .  protein supplement (PREMIER PROTEIN) liquid, 11 oz, Oral, Q24H, Dessa Phi, DO, 11 oz at 10/14/17 2028 .  thiamine (VITAMIN B-1) tablet 100 mg, 100 mg, Oral, Daily, Bonnielee Haff, MD, 100 mg at 10/14/17 1235  Exam: Current vital signs: BP 116/64 (BP Location: Right Arm)   Pulse 77   Temp 97.8 F (36.6 C) (Oral)   Resp 15   Ht 5\' 6"  (1.676 m)   Wt 89.4 kg (197 lb 1.5 oz)   SpO2 97%   BMI 31.81 kg/m  Vital signs in last 24 hours: Temp:  [97.8 F (36.6 C)-98 F (36.7 C)] 97.8 F (36.6 C) (04/03 1925) Pulse Rate:  [63-77] 77 (04/03 2200) Resp:  [15-18] 15 (04/03 2200) BP: (74-119)/(50-69) 116/64 (04/03 2200) SpO2:  [95 %-97 %] 97 % (04/03 2200) Weight:  [89.4 kg (197 lb 1.5 oz)] 89.4 kg (197 lb 1.5 oz) (04/03 2200) General: Patient is awake alert in no acute distress H ENT: Normocephalic atraumatic dry mucous membranes Lungs clear to auscultation Cardiovascular: S1-S2 heard regular rate rhythm Extremities: Warm well perfused Neurological exam Patient is awake, alert, he is oriented to time place  and person but is slow to respond to questions. Poor attention concentration. Speech is clear. Naming, attention and repetition are intact albeit slow. Cranial nerves: Pupils equal round reactive to light, extra ocular movement intact, visual fields full, face symmetric, facial sensation intact, auditory acuity intact, palate elevates symmetrically, tongue midline. Motor exam: No vertical drift noted in all fours.  At least 4+/5 in all 4 extremities. Sensory: Intact light touch all over with no extinction Coordination: Intact finger nose finger bilaterally. Gait was not tested NIHSS-0   Labs I have reviewed labs in epic and the results pertinent to this consultation are:  CBC    Component Value Date/Time   WBC 8.8 10/14/2017 0413   RBC 4.59 10/14/2017 0413   HGB 13.6 10/14/2017 0413   HGB 13.2 06/18/2017 1125   HCT 39.1 10/14/2017 0413   HCT 39.7 06/18/2017 1125   PLT 281 10/14/2017 0413   PLT 241 06/18/2017 1125   MCV 85.2  10/14/2017 0413   MCV 91.7 06/18/2017 1125   MCH 29.6 10/14/2017 0413   MCHC 34.8 10/14/2017 0413   RDW 13.1 10/14/2017 0413   RDW 13.2 06/18/2017 1125   LYMPHSABS 2.0 07/03/2017 0948   LYMPHSABS 2.5 06/18/2017 1125   MONOABS 0.8 07/03/2017 0948   MONOABS 0.4 06/18/2017 1125   EOSABS 0.0 07/03/2017 0948   EOSABS 0.1 06/18/2017 1125   BASOSABS 0.0 07/03/2017 0948   BASOSABS 0.1 06/18/2017 1125    CMP     Component Value Date/Time   NA 133 (L) 10/14/2017 0413   NA 133 (A) 10/03/2017   NA 139 06/18/2017 1125   K 4.1 10/14/2017 0413   K 4.5 06/18/2017 1125   CL 106 10/14/2017 0413   CO2 17 (L) 10/14/2017 0413   CO2 27 10/03/2017   CO2 21 (L) 06/18/2017 1125   GLUCOSE 164 (H) 10/14/2017 0413   GLUCOSE 193 (H) 06/18/2017 1125   BUN 55 (H) 10/14/2017 0413   BUN 46 (A) 10/03/2017   BUN 26.5 (H) 06/18/2017 1125   CREATININE 1.09 10/14/2017 0413   CREATININE 1.2 06/18/2017 1125   CALCIUM 8.4 (L) 10/14/2017 0413   CALCIUM 9.3 06/18/2017 1125    PROT 7.1 06/18/2017 1125   ALBUMIN 3.6 06/18/2017 1125   AST 9 (A) 10/03/2017   AST 12 06/18/2017 1125   ALT 10 10/03/2017   ALT 13 06/18/2017 1125   ALKPHOS 95 10/03/2017   ALKPHOS 67 06/18/2017 1125   BILITOT 0.40 06/18/2017 1125   GFRNONAA >60 10/14/2017 0413   GFRAA >60 10/14/2017 0413   Recent A1c 13.4  Imaging I have reviewed the images obtained:  MRI examination of the brain -punctate area of restricted diffusion within the cerebellum on the right. MRI of the brain done later with no evidence of LVO.  Patent anterior and posterior circulation.  Assessment:  82 year old man with multiple cerebrovascular risk factors who was admitted for generalized weakness, falls and increasing somnolence, noted to have diabetic ketoacidosis, which is treated but he continued to be somewhat confused.  Lab workup and imaging for encephalopathy led to an MRI of the brain that showed a punctate cerebellar infarct, and initially a concern for vertebral artery occlusion. Following up on the MRI of the brain, the MRA was done which did not reveal any LVO. At this time, I believe his stroke in the cerebellum is likely incidental and has no bearing on his current clinical presentation. He has all the risk factors for strokes, for which reason I think completing a stroke risk factor workup would be beneficial at this time. As far as his mentation and his encephalopathy is concerned, I think it is multifactorial and is probably also related to some degree of underlying dementia in the acute UTI as well as hyperglycemia..  I had no family members at bedside to give me a good baseline. Laboratory workup for reversible causes of dementia has shown B12 level of 946. Would recommend TSH, RPR also to be checked.  Impression: Toxic metabolic encephalopathy Incidental stroke in the right cerebellum-likely small vessel etiology.  Evaluate for Cardiologic source. Possible underlying cognitive  impairment-exacerbated by the underlying toxic metabolic derangements.  Recommendations: Telemetry to evaluate for underlying paroxysmal atrial fibrillation 2D echo Frequent neurochecks Lipid panel No need to recheck A1c-it was done on 10/13/2017 Carotid Dopplers (has already had MRI of the brain and MRA of the head) Check TSH, RPR. PT, OT, speech therapy Aspirin 325 Atorvastatin 80 Medical management of toxic metabolic  derangements per primary team, as you are.  Please page stroke NP/PA/MD (listed on AMION)  from 8am-4 pm as this patient will be followed by the stroke team at this point.  -- Amie Portland, MD [facility] Pager: [phone] If 7pm to 7am, please call on call as listed on AMION.

## 2017-10-15 ENCOUNTER — Inpatient Hospital Stay (HOSPITAL_COMMUNITY): Payer: Medicare Other

## 2017-10-15 ENCOUNTER — Other Ambulatory Visit: Payer: Self-pay

## 2017-10-15 DIAGNOSIS — I6330 Cerebral infarction due to thrombosis of unspecified cerebral artery: Secondary | ICD-10-CM

## 2017-10-15 DIAGNOSIS — I6300 Cerebral infarction due to thrombosis of unspecified precerebral artery: Secondary | ICD-10-CM

## 2017-10-15 HISTORY — PX: OTHER SURGICAL HISTORY: SHX169

## 2017-10-15 LAB — GLUCOSE, CAPILLARY
Glucose-Capillary: 169 mg/dL — ABNORMAL HIGH (ref 65–99)
Glucose-Capillary: 159 mg/dL — ABNORMAL HIGH (ref 65–99)
Glucose-Capillary: 224 mg/dL — ABNORMAL HIGH (ref 65–99)
Glucose-Capillary: 274 mg/dL — ABNORMAL HIGH (ref 65–99)

## 2017-10-15 LAB — RPR: RPR Ser Ql: NONREACTIVE

## 2017-10-15 LAB — FOLATE RBC
Folate, Hemolysate: 381.3 ng/mL
Folate, RBC: 968 ng/mL (ref >498)
Hematocrit: 39.4 % (ref 37.5–51.0)

## 2017-10-15 LAB — HIV ANTIBODY (ROUTINE TESTING W REFLEX): HIV Screen 4th Generation wRfx: NONREACTIVE

## 2017-10-15 MED ORDER — INSULIN ASPART 100 UNIT/ML ~~LOC~~ SOLN
2.0000 [IU] | Freq: Three times a day (TID) | SUBCUTANEOUS | Status: DC
  Administered 2017-10-15 – 2017-10-16 (×4): 2 [IU] via SUBCUTANEOUS

## 2017-10-15 MED ORDER — CEPHALEXIN 500 MG PO CAPS
500.0000 mg | ORAL_CAPSULE | Freq: Two times a day (BID) | ORAL | Status: DC
  Administered 2017-10-15 – 2017-10-16 (×2): 500 mg via ORAL
  Filled 2017-10-15 (×2): qty 1

## 2017-10-15 NOTE — Progress Notes (Addendum)
STROKE TEAM PROGRESS NOTE   INTERVAL HISTORY His wife is at the bedside.  She states he is doing much better today with increased speech. He has had no new abnormalities overnight. He did work with physical therapy at [facility] and did well. She would like to take him home when he is able.   CBC:  CBC Latest Ref Rng & Units 10/14/2017 10/13/2017 10/12/2017  WBC 4.0 - 10.5 K/uL 8.8 10.5 13.6(H)  Hemoglobin 13.0 - 17.0 g/dL 13.6 16.8 16.4  Hematocrit 39.0 - 52.0 % 39.1 48.5 45.0  Platelets 150 - 400 K/uL 281 243 372     Comprehensive Metabolic Panel:   CMP Latest Ref Rng & Units 10/14/2017 10/13/2017 10/12/2017  Glucose 65 - 99 mg/dL 164(H) 170(H) 141(H)  BUN 6 - 20 mg/dL 55(H) 57(H) 59(H)  Creatinine 0.61 - 1.24 mg/dL 1.09 1.02 1.00  Sodium 135 - 145 mmol/L 133(L) 134(L) 142  Potassium 3.5 - 5.1 mmol/L 4.1 4.0 4.4  Chloride 101 - 111 mmol/L 106 107 109  CO2 22 - 32 mmol/L 17(L) 18(L) 22  Calcium 8.9 - 10.3 mg/dL 8.4(L) 8.8(L) 9.6  Total Protein 6.4 - 8.3 g/dL - - -  Total Bilirubin 0.20 - 1.20 mg/dL - - -  Alkaline Phos 25 - 125 - - -  AST 14 - 40 - - -  ALT 10 - 40 - - -   Lipid Panel: No results found for: CHOL, TRIG, HDL, CHOLHDL, VLDL, LDLCALC HgbA1c:  Lab Results  Component Value Date   HGBA1C 13.4 (H) 10/13/2017   Urine Drug Screen: No results found for: LABOPIA, COCAINSCRNUR, LABBENZ, AMPHETMU, THCU, LABBARB  Alcohol Level No results found for: [facility]  Mr Brain Wo Contrast  Result Date: 10/14/2017 CLINICAL DATA:  Generalized weakness with multiple falls. Somnolence. History of urothelial carcinoma. EXAM: MRI HEAD WITHOUT CONTRAST TECHNIQUE: Multiplanar, multiecho pulse sequences of the brain and surrounding structures were obtained without intravenous contrast. COMPARISON:  Head CT 10/12/2017 FINDINGS: Brain: There is a punctate focus of restricted diffusion within the inferior cerebellum on the right consistent with a small vessel infarction. No other acute infarction.  Elsewhere, brainstem and cerebellum appear normal. Cerebral hemispheres show mild to moderate chronic small-vessel ischemic changes of the deep and subcortical white matter. No cortical or large vessel territory infarction. No mass lesion, hemorrhage, hydrocephalus or extra-axial collection. Contrast was not utilized. Vascular: Major vessels at the base of the brain show flow. However, there may be abnormal flow in the distal right vertebral artery. Skull and upper cervical spine: Negative Sinuses/Orbits: Clear/normal Other: None IMPRESSION: Punctate acute infarction within the inferior cerebellum on the right. Possible abnormal flow in the distal right vertebral artery. Mild age related atrophy and cerebral hemispheric chronic white matter ischemic changes. No sign of metastatic disease. Electronically Signed   By: Shane [name] M.D.   On: 10/14/2017 16:39   Mr Jodene Nam Head Wo Contrast  Result Date: 10/14/2017 CLINICAL DATA:  82 y/o  M; stroke for follow-up. EXAM: MRA HEAD WITHOUT CONTRAST TECHNIQUE: Angiographic images of the Circle of Willis were obtained using MRA technique without intravenous contrast. COMPARISON:  10/14/2017 MRI of the head. FINDINGS: Internal carotid arteries:  Patent. Anterior cerebral arteries:  Patent. Middle cerebral arteries: Patent. Anatomic variant: Anterior and posterior communicating arteries not identified, likely hypoplastic or absent. Posterior cerebral arteries:  Patent. Basilar artery:  Patent. Vertebral arteries:  Patent. No evidence of high-grade stenosis, large vessel occlusion, or aneurysm identified. Mild motion artifact. IMPRESSION: Mild  motion artifact. Patent anterior and posterior intracranial circulation. No large vessel occlusion, aneurysm, or significant stenosis. Electronically Signed   By: Shane [name] M.D.   On: 10/14/2017 19:18     Vitals:   10/15/17 0100 10/15/17 0300 10/15/17 0500 10/15/17 0738  BP: 113/72 113/73 121/78   Pulse:      Resp:       Temp:    98.4 F (36.9 C)  TempSrc:    Oral  SpO2:      Weight:      Height:       General: Patient is awake alert in no acute distress H ENT: Normocephalic atraumatic dry mucous membranes Lungs clear to auscultation Cardiovascular: S1-S2 heard regular rate rhythm Extremities: Warm well perfused Neurological exam Patient is awake, alert, he is oriented to time place and person. He has decreased spontaneous speech output. He can answer questions, follow 1 and 2 step commands, name and repeat. Spontaneous speech is nonsensical. He has 5 min recall 2/3. Poor attention concentration. Speech is clear. Cranial nerves: Pupils equal round reactive to light, extra ocular movement intact, visual fields full, face symmetric, facial sensation intact, auditory acuity intact, palate elevates symmetrically, tongue midline. Motor exam: No vertical drift noted in all fours.  At least 4+/5 in all 4 extremities. Sensory: Intact light touch all over with no extinction Coordination: Intact finger nose finger bilaterally. Gait was not tested   ASSESSMENT/PLAN [name] with history of Coronary artery disease, diabetes (noncompliant with medications), and bladder cancer admitted with generalized weakness and falls, confused and encephalopathic. Found to be in DKA, have an UTI, hypotension, AKI and metabolic encephalopathy. MRI done as part of his encephalopathy. workup revealed an acute punctate right cerebellar infarct. He was transferred from [place] to [facility].  Stroke:  Incidental Punctate R cerebellar lacunar infarct. Altered mental status likely related to hyperglycemia and dehydration  MRI  Punctate right cerebellar infarct  MRA   Unremarkable  Carotid Doppler  pending   2D Echo  pending   LDL pending   Lovenox 40 mg sq daily for VTE prophylaxis  Fall precautions  Diet Carb Modified Fluid consistency: Thin; Room service appropriate? Yes  No  antithrombotic prior to admission, now on aspirin 325 mg daily. Given mild stroke , will place on aspirin 81 mg and plavix 75 mg daily x 3 weeks, then aspirin alone. Orders adjusted.   Therapy recommendations:  No therapy needs at [facility]. Rolling walker recommended  Disposition:  Anticipate home with wife  Hypertension  Stable . Okay to resume home blood pressure medicines with BP goal normotensive  Hyperlipidemia  Home meds:  Pravachol 10 mg and lovaza, resumed in hospital  LDL pending, goal < 70  Continue statin at discharge  Diabetes type II DKA  HgbA1c 13.4, goal < 7.0  Uncontrolled - noncompliant with medications 1 year  Has been on insulin drip, now on Lantus  Diabetes coordinator following with recommendations  Other Stroke Risk Factors  Advanced age  HIV negative  Obesity, Body mass index is 31.81 kg/m.  Coronary artery disease  Other Active Problems  UTI, present on admission  Acute metabolic encephalopathy. Question baseline dementia. B12 elevated at 946. TSH and RPR were normal.   Acute kidney injury resolved with IV fluids  Hospital day # [place], MSN, APRN, ANVP-BC, AGPCNP-BC Advanced Practice Stroke Nurse [place] for Schedule & Pager information 10/15/2017 1:29 PM  I have personally examined this patient, reviewed notes, independently viewed imaging studies, participated in medical decision making and plan of care.ROS completed by me personally and pertinent positives fully documented  I have made any additions or clarifications directly to the above note. Agree with note above.  He presented with confusion and encephalopathy secondary to diabetic ketoacidosis and dehydration. MRI scan shows a tiny cerebellar infarct which is likely incidental and elected to small vessel disease. Continue ongoing stroke workup. Discuss with Dr. Bonner Puna. Greater than 50% time during this 25 minute visit was spent on counseling and  coordination of care about his stroke workup.  Antony Contras, MD Medical Director [facility] Pager: [phone] 10/15/2017 3:49 PM  To contact Stroke Continuity provider, please refer to [email or web address] After hours, contact General Neurology

## 2017-10-15 NOTE — Progress Notes (Signed)
Inpatient Diabetes Program Recommendations  AACE/ADA: New Consensus Statement on Inpatient Glycemic Control (2015)  Target Ranges:  Prepandial:   less than 140 mg/dL      Peak postprandial:   less than 180 mg/dL (1-2 hours)      Critically ill patients:  140 - 180 mg/dL   Results for TYEE, VANDEVOORDE (MRN [id number]) as of 10/15/2017 08:38  Ref. Range 10/14/2017 07:49 10/14/2017 12:11 10/14/2017 17:59 10/14/2017 23:33 10/15/2017 06:09  Glucose-Capillary Latest Ref Range: 65 - 99 mg/dL 172 (H) 232 (H) 204 (H) 139 (H) 169 (H)   Review of Glycemic Control  Diabetes history: DM2 Outpatient Diabetes medications: Lantus 23 units QHS, Metformin 1000 mg BID (per home med list not taking Metformin) Current orders for Inpatient glycemic control: Lantus 10 units QHS, Novolog 0-15 units TID with meals, Novolog 0-5 units QHS  Inpatient Diabetes Program Recommendations: Insulin - Meal Coverage: Please consider ordering Novolog 3 units TID wtih meals for meal coverage if patient eats at least 50% of meals.  Thanks, Barnie Alderman, RN, MSN, CDE Diabetes Coordinator Inpatient Diabetes Program [phone] (Team Pager from 8am to 5pm)

## 2017-10-15 NOTE — Progress Notes (Signed)
SLP Cancellation Note  Patient Details Name: Shane [name] MRN: [id number] DOB: September 27, 1935   Cancelled treatment:       Reason Eval/Treat Not Completed: Patient at procedure or test/unavailable   Donnelle Olmeda, Katherene Ponto 10/15/2017, 1:58 PM

## 2017-10-15 NOTE — CV Procedure (Signed)
2D echo attempted but occupational therapy in room. Try echo later

## 2017-10-15 NOTE — Progress Notes (Signed)
VASCULAR LAB PRELIMINARY  PRELIMINARY  PRELIMINARY  PRELIMINARY  Carotid duplex completed.    Preliminary report:  1-39% ICA stenosis.  Vertebral artery flow is antegrade.   Shane [name], RVT 10/15/2017, 2:05 PM

## 2017-10-15 NOTE — Progress Notes (Signed)
Patient arrived from [facility] around 2230 alert but slow to respond wil continue Q2 neuro checks and vitals until 0500.

## 2017-10-15 NOTE — Progress Notes (Signed)
Occupational Therapy Evaluation Patient Details Name: Shane [name] MRN: [id number] DOB: [birth year] Today's Date: 10/15/2017    History of Present Illness 82 yo male admitted with DKA, weakness, UTI, confusion. Hx of Dm, HTN, falls, urothelial cancer, CAD.    Clinical Impression   Pt admitted from home. Pt has had multiple falls and has generalized weakness. Pt overall min A for mobility with RW and mod A with LB ADL. Pt is currently not at baseline cognitively and will need 24/7 S. Feel pt would benefit from HHOT/PT to facilitate independence and reduce risk of falls. Will follow acutely to facilitate safe DC home.    Follow Up Recommendations  Home health OT;Supervision/Assistance - 24 hour    Equipment Recommendations  3 in 1 bedside commode;Other (comment)(RW)    Recommendations for Other Services       Precautions / Restrictions Precautions Precautions: Fall Precaution Comments: urostomy Restrictions Weight Bearing Restrictions: No      Mobility Bed Mobility Overal bed mobility: Needs Assistance Bed Mobility: Supine to Sit     Supine to sit: Min guard;HOB elevated     General bed mobility comments: Increased time.Pt relied on bedrail   Transfers Overall transfer level: Needs assistance Equipment used: Rolling walker (2 wheeled) Transfers: Sit to/from Stand Sit to Stand: Min guard;From elevated surface         General transfer comment: Close guard for safety. VCs safety, hand placement. Increased time.     Balance Overall balance assessment: History of Falls;Needs assistance   Sitting balance-Leahy Scale: Fair     Standing balance support: Bilateral upper extremity supported Standing balance-Leahy Scale: Poor                             ADL either performed or assessed with clinical judgement   ADL Overall ADL's : Needs assistance/impaired     Grooming: Set up;Sitting   Upper Body Bathing: Set up;Sitting   Lower Body Bathing:  Moderate assistance;Sit to/from stand   Upper Body Dressing : Minimal assistance;Sitting   Lower Body Dressing: Moderate assistance;Sit to/from stand   Toilet Transfer: Minimal assistance;BSC;Stand-pivot   Toileting- Clothing Manipulation and Hygiene: Maximal assistance Toileting - Clothing Manipulation Details (indicate cue type and reason): incontinenet     Functional mobility during ADLs: Minimal assistance;Rolling walker;Cueing for safety       Vision         Perception     Praxis      Pertinent Vitals/Pain Pain Assessment: No/denies pain     Hand Dominance     Extremity/Trunk Assessment Upper Extremity Assessment Upper Extremity Assessment: Generalized weakness   Lower Extremity Assessment Lower Extremity Assessment: Generalized weakness   Cervical / Trunk Assessment Cervical / Trunk Assessment: Normal   Communication Communication Communication: No difficulties   Cognition Arousal/Alertness: Awake/alert Behavior During Therapy: Flat affect Overall Cognitive Status: Impaired/Different from baseline Area of Impairment: Attention;Memory;Safety/judgement;Awareness;Problem solving                   Current Attention Level: Selective     Safety/Judgement: Decreased awareness of safety Awareness: Emergent Problem Solving: Slow processing General Comments: multiple incontinenet episodes during session. Pt unaware   General Comments       Exercises     Shoulder Instructions      Home Living Family/patient expects to be discharged to:: Private residence Living Arrangements: Spouse/significant other Available Help at Discharge: Family Type of Home: House Home Access: Stairs to  enter Technical brewer of Steps: 3 Entrance Stairs-Rails: Right Home Layout: One level     Bathroom Shower/Tub: Astronomer Accessibility: Yes How Accessible: Accessible via walker Home Equipment: None          Prior  Functioning/Environment Level of Independence: Independent        Comments: since urostomy, wife has been assisting with ADL        OT Problem List: Decreased strength;Decreased activity tolerance;Impaired balance (sitting and/or standing);Decreased safety awareness;Decreased knowledge of use of DME or AE      OT Treatment/Interventions: Self-care/ADL training;Therapeutic exercise;DME and/or AE instruction;Therapeutic activities;Cognitive remediation/compensation;Patient/family education;Balance training    OT Goals(Current goals can be found in the care plan section) Acute Rehab OT Goals Patient Stated Goal: home OT Goal Formulation: With patient Time For Goal Achievement: 10/29/17 Potential to Achieve Goals: Good  OT Frequency: Min 2X/week   Barriers to D/C:            Co-evaluation              AM-PAC PT "6 Clicks" Daily Activity     Outcome Measure Help from another person eating meals?: None Help from another person taking care of personal grooming?: A Little Help from another person toileting, which includes using toliet, bedpan, or urinal?: A Lot Help from another person bathing (including washing, rinsing, drying)?: A Lot Help from another person to put on and taking off regular upper body clothing?: A Little Help from another person to put on and taking off regular lower body clothing?: A Lot 6 Click Score: 16   End of Session Equipment Utilized During Treatment: Gait belt;Rolling walker Nurse Communication: Mobility status  Activity Tolerance: Patient tolerated treatment well Patient left: in chair;with call bell/phone within reach;with chair alarm set  OT Visit Diagnosis: Muscle weakness (generalized) (M62.81);History of falling (Z91.81);Other symptoms and signs involving cognitive function;Unsteadiness on feet (R26.81)                Time: 6010-9323 OT Time Calculation (min): 32 min Charges:  OT General Charges $OT Visit: 1 Visit OT Evaluation $OT  Eval Moderate Complexity: 1 Mod OT Treatments $Self Care/Home Management : 8-22 mins G-Codes:     [facility] OT/L  [phone] 10/15/2017  Chani Ghanem,HILLARY 10/15/2017, 4:19 PM

## 2017-10-15 NOTE — Progress Notes (Signed)
Physical Therapy Treatment Patient Details Name: Shane [name] MRN: [id number] DOB: 05/11/1936 Today's Date: 10/15/2017    History of Present Illness 82 yo male admitted with DKA, weakness, UTI, confusion. Hx of Dm, HTN, falls, urothelial cancer, CAD. MRI which demonstrated punctate left cerebellar stroke.     PT Comments    PTA pt able to ambulate without AD, currently he is heavily reliant on RW for support. Pt also not at his baseline cognition and will require 24 hr supervision for safety. Although pt's wife previously declined HHPT level rehab at d/c PT recommends HHPT at this time to improve strength and balance for pt to safely mobilize in his home environment. PT will continue to follow acutely until d/c.      Follow Up Recommendations  Home health PT     Equipment Recommendations  Rolling walker with 5" wheels    Recommendations for Other Services       Precautions / Restrictions Precautions Precautions: Fall Precaution Comments: urostomy Restrictions Weight Bearing Restrictions: No    Mobility  Bed Mobility Overal bed mobility: Needs Assistance Bed Mobility: Supine to Sit     Supine to sit: Min guard;HOB elevated     General bed mobility comments: OOB in recliner on entry  Transfers Overall transfer level: Needs assistance Equipment used: Rolling walker (2 wheeled) Transfers: Sit to/from Stand Sit to Stand: Min assist         General transfer comment: minA for power up and steadying at RW.  Ambulation/Gait Ambulation/Gait assistance: Min guard Ambulation Distance (Feet): 250 Feet Assistive device: Rolling walker (2 wheeled) Gait Pattern/deviations: Step-through pattern Gait velocity: slowed Gait velocity interpretation: Below normal speed for age/gender General Gait Details: min guard for safety no overt LoB but requires heavy reliance on RW, vc for proximity to RW       Balance Overall balance assessment: History of Falls;Needs  assistance   Sitting balance-Leahy Scale: Fair     Standing balance support: Bilateral upper extremity supported Standing balance-Leahy Scale: Poor                              Cognition Arousal/Alertness: Awake/alert Behavior During Therapy: Flat affect Overall Cognitive Status: Impaired/Different from baseline Area of Impairment: Attention;Memory;Safety/judgement;Awareness;Problem solving                   Current Attention Level: Selective     Safety/Judgement: Decreased awareness of safety Awareness: Emergent Problem Solving: Slow processing General Comments: Pt very tangential with difficulty finishing thoughts             Pertinent Vitals/Pain Pain Assessment: No/denies pain    Home Living Family/patient expects to be discharged to:: Private residence Living Arrangements: Spouse/significant other Available Help at Discharge: Family Type of Home: House Home Access: Stairs to enter Entrance Stairs-Rails: Right Home Layout: One level Home Equipment: None      Prior Function Level of Independence: Independent      Comments: since urostomy, wife has been assisting with ADL   PT Goals (current goals can now be found in the care plan section) Acute Rehab PT Goals Patient Stated Goal: home PT Goal Formulation: With patient/family Time For Goal Achievement: 10/27/17 Potential to Achieve Goals: Good Progress towards PT goals: Progressing toward goals    Frequency    Min 3X/week      PT Plan Discharge plan needs to be updated       AM-PAC PT "6  Clicks" Daily Activity  Outcome Measure  Difficulty turning over in bed (including adjusting bedclothes, sheets and blankets)?: A Little Difficulty moving from lying on back to sitting on the side of the bed? : A Little Difficulty sitting down on and standing up from a chair with arms (e.g., wheelchair, bedside commode, etc,.)?: Unable Help needed moving to and from a bed to chair (including  a wheelchair)?: A Little Help needed walking in hospital room?: A Little Help needed climbing 3-5 steps with a railing? : A Lot 6 Click Score: 15    End of Session Equipment Utilized During Treatment: Gait belt Activity Tolerance: Patient tolerated treatment well Patient left: in chair;with call bell/phone within reach Nurse Communication: Mobility status       Time: 1610-9604 PT Time Calculation (min) (ACUTE ONLY): 22 min  Charges:  $Gait Training: 8-22 mins                    G Codes:       Ariane Ditullio B. Migdalia Dk PT, DPT Acute Rehabilitation  [phone] Pager [phone]     [place] 10/15/2017, 7:19 PM

## 2017-10-15 NOTE — Progress Notes (Signed)
PROGRESS NOTE  Shane [name]  XQJ:[id number] DOB: 11-04-1935 DOA: 10/12/2017 PCP: Tammi Sou, MD   Brief Narrative: Raad Clayson is an 82 y.o. male with a history of bladder cancer s/p resection with ileal conduit and diabetes not compliant with medications who presented to the ED 4/1 for weakness, confusion, multiple falls, polyuria, polydipsia found to have DKA. He was admitted with insulin infusion per protocol with improvement on glucose, resolution of DKA and transition to basal-bolus insulin. Despite correction of metabolic derangements, he remained encephalopathic which was worked up including MRI which demonstrated punctate left cerebellar stroke. He was transferred to [facility] for neurological evaluation and stroke work up.   Assessment & Plan: Active Problems:   DKA (diabetic ketoacidoses) (HCC)   Malnutrition of moderate degree   Cerebral thrombosis with cerebral infarction  Uncontrolled IDT2DM presenting with DKA: DKA resolved. - Will require basal-bolus insulin at discharge. Wife is engaged to facilitate this.  - HbA1c 13.4% - CBGs at inpatient goal for past 24 hours, without hypoglycemia. Anticipate improved po intake with improved level of alertness, so will add very low dose mealtime insulin.  - Appreciate diabetes coordinator efforts.  Acute metabolic encephalopathy: Appears to have been precipitated by DKA and also possibly by the precipitous improvement in glucose from an average well above 300mg /dl. Punctate cerebellar CVA may explain frequent falls but I don't believe this is primary contribution to confusion. B12, TSH, RPR, HIV levels wnl. Not consistent with withdrawal.  - Monitor closely with stabilization of metabolic derangements - CVA work up as below - Consider outpatient neuropsychiatry evaluation, suspect some preexisting cognitive deficit.  Left cerebellar CVA:  - DAPT x3 weeks, then ASA alone per neurology - On statin - Carotid doppler, echo,  and lipids pending.  UTI: Culture not clonal. - Continue empiric Tx with keflex  AKI: Resolved  Hyperlipidemia:  - Statin  Hypotension: Possibly related to dehydration and lisinopril, improved with fluids.  - Stable.  History of urothelial cancer s/p resection and ileal conduit.  - WOC consulted, provided supplies for urostomy. - No other issues germane to hospitalization  DVT prophylaxis: Lovenox Code Status: Full Family Communication: Wife at bedside Disposition Plan: Home pending improved/stable mental status  Consultants:   Neurology  Diabetes coordinator  Procedures:   Echo, carotid dopplers  Antimicrobials:  Ceftriaxone 4/1 - 4/4  Keflex 4/4 >>    Subjective: Reports no concerns/pain. Not able to give history of recent events. Wife reports improvement in confusion day over day for 2 days.  Objective: Vitals:   10/15/17 0300 10/15/17 0500 10/15/17 0738 10/15/17 1149  BP: 113/73 121/78    Pulse:      Resp:      Temp:   98.4 F (36.9 C) 98.3 F (36.8 C)  TempSrc:   Oral Oral  SpO2:      Weight:      Height:        Intake/Output Summary (Last 24 hours) at 10/15/2017 1555 Last data filed at 10/15/2017 1139 Gross per 24 hour  Intake 240 ml  Output 1800 ml  Net -1560 ml   Filed Weights   10/13/17 0356 10/13/17 1035 10/14/17 2200  Weight: 84.9 kg (187 lb 2.7 oz) 85 kg (187 lb 6.3 oz) 89.4 kg (197 lb 1.5 oz)    Gen: 82 y.o. male in no distress eating lunch Pulm: Non-labored breathing room air. Clear to auscultation bilaterally.  CV: Regular rate and rhythm. No murmur, rub, or gallop. No JVD, no pedal  edema. GI: Abdomen soft, non-tender, non-distended, with normoactive bowel sounds. No organomegaly or masses felt. Urostomy site red without tenderness or discharge.  Ext: Warm, no deformities Skin: No rashes, lesions no ulcers Neuro: Alert, oriented to person and hospital. 1 of 3 word short term recall. Subtle dysmetria in upper extremities. No focal  neurological deficits.  Psych: Judgement and insight appear impaired. Mood & affect appropriate.   Data Reviewed: I have personally reviewed following labs and imaging studies  CBC: Recent Labs  Lab 10/12/17 1244 10/13/17 0517 10/14/17 0413 10/14/17 1154  WBC 13.6* 10.5 8.8  --   HGB 16.4 16.8 13.6  --   HCT 45.0 48.5 39.1 39.4  MCV 84.1 84.2 85.2  --   PLT 372 243 281  --    Basic Metabolic Panel: Recent Labs  Lab 10/12/17 1244 10/12/17 1910 10/12/17 2359 10/13/17 0517 10/14/17 0413  NA 130* 137 142 134* 133*  K 4.2 3.3* 4.4 4.0 4.1  CL 99* 107 109 107 106  CO2 16* 19* 22 18* 17*  GLUCOSE 490* 213* 141* 170* 164*  BUN 74* 65* 59* 57* 55*  CREATININE 1.26* 1.07 1.00 1.02 1.09  CALCIUM 9.7 9.6 9.6 8.8* 8.4*  MG  --  2.4  --   --   --    GFR: Estimated Creatinine Clearance: 56.6 mL/min (by C-G formula based on SCr of 1.09 mg/dL). Liver Function Tests: No results for input(s): AST, ALT, ALKPHOS, BILITOT, PROT, ALBUMIN in the last 168 hours. No results for input(s): LIPASE, AMYLASE in the last 168 hours. No results for input(s): AMMONIA in the last 168 hours. Coagulation Profile: No results for input(s): INR, PROTIME in the last 168 hours. Cardiac Enzymes: Recent Labs  Lab 10/12/17 1244 10/12/17 1910 10/12/17 2359 10/13/17 0517  TROPONINI 0.05* 0.05* 0.06* 0.04*   BNP (last 3 results) No results for input(s): PROBNP in the last 8760 hours. HbA1C: Recent Labs    10/13/17 0729  HGBA1C 13.4*   CBG: Recent Labs  Lab 10/14/17 1211 10/14/17 1759 10/14/17 2333 10/15/17 0609 10/15/17 1121  GLUCAP 232* 204* 139* 169* 159*   Lipid Profile: No results for input(s): CHOL, HDL, LDLCALC, TRIG, CHOLHDL, LDLDIRECT in the last 72 hours. Thyroid Function Tests: Recent Labs    10/14/17 1154  TSH 1.084   Anemia Panel: Recent Labs    10/14/17 1154  VITAMINB12 946*   Urine analysis:    Component Value Date/Time   COLORURINE YELLOW 10/12/2017 1257    APPEARANCEUR TURBID (A) 10/12/2017 1257   LABSPEC 1.012 10/12/2017 1257   PHURINE 6.0 10/12/2017 1257   GLUCOSEU >=500 (A) 10/12/2017 1257   HGBUR MODERATE (A) 10/12/2017 1257   BILIRUBINUR NEGATIVE 10/12/2017 1257   BILIRUBINUR negative 04/14/2016 1456   KETONESUR 5 (A) 10/12/2017 1257   PROTEINUR 30 (A) 10/12/2017 1257   UROBILINOGEN 0.2 04/14/2016 1456   NITRITE NEGATIVE 10/12/2017 1257   LEUKOCYTESUR MODERATE (A) 10/12/2017 1257   Recent Results (from the past 240 hour(s))  Urine culture     Status: Abnormal   Collection Time: 10/12/17 12:57 PM  Result Value Ref Range Status   Specimen Description   Final    URINE, RANDOM Performed at [facility], [place] [address] [place], [place] [address]    Special Requests   Final    NONE Performed at [facility], [place] [address], [place], [place] [address]    Culture MULTIPLE SPECIES PRESENT, SUGGEST RECOLLECTION (A)  Final   Report  Status 10/13/2017 FINAL  Final  MRSA PCR Screening     Status: None   Collection Time: 10/12/17  6:45 PM  Result Value Ref Range Status   MRSA by PCR NEGATIVE NEGATIVE Final    Comment:        The GeneXpert MRSA Assay (FDA approved for NASAL specimens only), is one component of a comprehensive MRSA colonization surveillance program. It is not intended to diagnose MRSA infection nor to guide or monitor treatment for MRSA infections. Performed at [facility], [place] [address], [place], [place] [address]       Radiology Studies: Mr Brain Wo Contrast  Result Date: 10/14/2017 CLINICAL DATA:  Generalized weakness with multiple falls. Somnolence. History of urothelial carcinoma. EXAM: MRI HEAD WITHOUT CONTRAST TECHNIQUE: Multiplanar, multiecho pulse sequences of the brain and surrounding structures were obtained without intravenous contrast. COMPARISON:  Head CT 10/12/2017 FINDINGS: Brain: There is a punctate focus of restricted diffusion within the  inferior cerebellum on the right consistent with a small vessel infarction. No other acute infarction. Elsewhere, brainstem and cerebellum appear normal. Cerebral hemispheres show mild to moderate chronic small-vessel ischemic changes of the deep and subcortical white matter. No cortical or large vessel territory infarction. No mass lesion, hemorrhage, hydrocephalus or extra-axial collection. Contrast was not utilized. Vascular: Major vessels at the base of the brain show flow. However, there may be abnormal flow in the distal right vertebral artery. Skull and upper cervical spine: Negative Sinuses/Orbits: Clear/normal Other: None IMPRESSION: Punctate acute infarction within the inferior cerebellum on the right. Possible abnormal flow in the distal right vertebral artery. Mild age related atrophy and cerebral hemispheric chronic white matter ischemic changes. No sign of metastatic disease. Electronically Signed   By: Nelson Chimes M.D.   On: 10/14/2017 16:39   Mr Jodene Nam Head Wo Contrast  Result Date: 10/14/2017 CLINICAL DATA:  82 y/o  M; stroke for follow-up. EXAM: MRA HEAD WITHOUT CONTRAST TECHNIQUE: Angiographic images of the Circle of Willis were obtained using MRA technique without intravenous contrast. COMPARISON:  10/14/2017 MRI of the head. FINDINGS: Internal carotid arteries:  Patent. Anterior cerebral arteries:  Patent. Middle cerebral arteries: Patent. Anatomic variant: Anterior and posterior communicating arteries not identified, likely hypoplastic or absent. Posterior cerebral arteries:  Patent. Basilar artery:  Patent. Vertebral arteries:  Patent. No evidence of high-grade stenosis, large vessel occlusion, or aneurysm identified. Mild motion artifact. IMPRESSION: Mild motion artifact. Patent anterior and posterior intracranial circulation. No large vessel occlusion, aneurysm, or significant stenosis. Electronically Signed   By: Kristine Garbe M.D.   On: 10/14/2017 19:18    Scheduled Meds: .   stroke: mapping our early stages of recovery book   Does not apply Once  . aspirin  300 mg Rectal Daily   Or  . aspirin  325 mg Oral Daily  . enoxaparin (LOVENOX) injection  40 mg Subcutaneous Q24H  . feeding supplement (GLUCERNA SHAKE)  237 mL Oral Q24H  . insulin aspart  0-15 Units Subcutaneous TID WC  . insulin aspart  0-5 Units Subcutaneous QHS  . insulin aspart  2 Units Subcutaneous TID WC  . insulin glargine  10 Units Subcutaneous QHS  . polyethylene glycol  17 g Oral Daily  . pravastatin  20 mg Oral Daily  . protein supplement shake  11 oz Oral Q24H  . thiamine  100 mg Oral Daily   Continuous Infusions: . cefTRIAXone (ROCEPHIN)  IV Stopped (10/15/17 1552)     LOS: 3 days   Time  spent: 25 minutes.  Vance Gather, MD [facility] Pager [phone]  If 7PM-7AM, please contact night-coverage [email or web address] Password [email or web address] 10/15/2017, 3:55 PM

## 2017-10-15 NOTE — Consult Note (Addendum)
[place] Nurse ostomy consult note Consult requested for urostomy supplies.  Pt had surgery last year and wife states they are independent with pouch application and emptying prior to admission and deny any problems with pouch appliaction or ordering supplies.   Stoma type/location: Stoma red and viable when visualized through the pouch, which is intact with a good seal.  Pt's wife states she applied it 2 days ago.  Stomal assessment/size: She cuts the pouch opening to 1 inch and they are using a barrier ring and one piece urostomy pouch. 2 extra sets of supplies left at bedside and wife states she will change the pouch PRN.  Attached a new bedside drainage bag and adapter. Pt and wife deny further questions or need for assistance. Please re-consult if further assistance is needed.  Thank-you,  Julien Girt MSN, [place], [place], [place], [place]

## 2017-10-16 ENCOUNTER — Inpatient Hospital Stay (HOSPITAL_COMMUNITY): Payer: Medicare Other

## 2017-10-16 DIAGNOSIS — R531 Weakness: Secondary | ICD-10-CM

## 2017-10-16 DIAGNOSIS — E440 Moderate protein-calorie malnutrition: Secondary | ICD-10-CM

## 2017-10-16 DIAGNOSIS — R410 Disorientation, unspecified: Secondary | ICD-10-CM

## 2017-10-16 DIAGNOSIS — I10 Essential (primary) hypertension: Secondary | ICD-10-CM

## 2017-10-16 HISTORY — PX: TRANSTHORACIC ECHOCARDIOGRAM: SHX275

## 2017-10-16 LAB — ECHOCARDIOGRAM COMPLETE
Height: 66 in
Weight: 3153.46 oz

## 2017-10-16 LAB — GLUCOSE, CAPILLARY
Glucose-Capillary: 144 mg/dL — ABNORMAL HIGH (ref 65–99)
Glucose-Capillary: 180 mg/dL — ABNORMAL HIGH (ref 65–99)
Glucose-Capillary: 169 mg/dL — ABNORMAL HIGH (ref 65–99)

## 2017-10-16 LAB — LIPID PANEL
Cholesterol: 94 mg/dL (ref 0–200)
HDL: 26 mg/dL — ABNORMAL LOW (ref >40)
LDL Cholesterol: 50 mg/dL (ref 0–99)
Total CHOL/HDL Ratio: 3.6 RATIO
Triglycerides: 88 mg/dL (ref <150)
VLDL: 18 mg/dL (ref 0–40)

## 2017-10-16 MED ORDER — LIVING WELL WITH DIABETES BOOK
Freq: Once | Status: AC
  Administered 2017-10-16: 18:00:00
  Filled 2017-10-16: qty 1

## 2017-10-16 MED ORDER — INSULIN STARTER KIT- PEN NEEDLES (ENGLISH)
1.0000 | Freq: Once | Status: AC
  Administered 2017-10-16: 1
  Filled 2017-10-16: qty 1

## 2017-10-16 MED ORDER — CEPHALEXIN 500 MG PO CAPS: 500.0000 mg | ORAL_CAPSULE | Freq: Two times a day (BID) | ORAL | 0 refills | Status: DC

## 2017-10-16 MED ORDER — FREESTYLE LIBRE 14 DAY READER DEVI: 1.0000 | Freq: Once | 0 refills | Status: AC

## 2017-10-16 MED ORDER — INSULIN GLARGINE 100 UNITS/ML SOLOSTAR PEN: PEN_INJECTOR | SUBCUTANEOUS | 11 refills | Status: DC

## 2017-10-16 MED ORDER — PRAVASTATIN SODIUM 20 MG PO TABS: 20.0000 mg | ORAL_TABLET | Freq: Every day | ORAL | 0 refills | Status: DC

## 2017-10-16 MED ORDER — ASPIRIN 325 MG PO TABS: 325.0000 mg | ORAL_TABLET | Freq: Every day | ORAL | 0 refills | Status: DC

## 2017-10-16 MED ORDER — LISINOPRIL 5 MG PO TABS: 5.0000 mg | ORAL_TABLET | Freq: Every day | ORAL | 0 refills | Status: DC

## 2017-10-16 MED ORDER — CLOPIDOGREL BISULFATE 75 MG PO TABS: 75.0000 mg | ORAL_TABLET | Freq: Every day | ORAL | 0 refills | Status: DC

## 2017-10-16 MED ORDER — FREESTYLE LIBRE 14 DAY SENSOR MISC: 1.0000 | Freq: Once | 0 refills | Status: AC

## 2017-10-16 MED ORDER — METFORMIN HCL 1000 MG PO TABS: 1000.0000 mg | ORAL_TABLET | Freq: Two times a day (BID) | ORAL | 0 refills | Status: DC

## 2017-10-16 MED ORDER — PERFLUTREN LIPID MICROSPHERE
1.0000 mL | INTRAVENOUS | Status: AC | PRN
  Administered 2017-10-16: 2 mL via INTRAVENOUS
  Filled 2017-10-16: qty 10

## 2017-10-16 MED ORDER — BLOOD GLUCOSE METER KIT: PACK | 0 refills | Status: DC

## 2017-10-16 MED ORDER — INSULIN PEN NEEDLE 31G X 5 MM MISC: 0 refills | Status: DC

## 2017-10-16 MED ORDER — INSULIN ASPART 100 UNIT/ML FLEXPEN: PEN_INJECTOR | SUBCUTANEOUS | 0 refills | Status: DC

## 2017-10-16 NOTE — Care Management Important Message (Signed)
Important Message  Patient Details  Name: Shane [name] MRN: [id number] Date of Birth: 23-Jun-1936   Medicare Important Message Given:  Yes    [name] 10/16/2017, 1:04 PM

## 2017-10-16 NOTE — Discharge Instructions (Signed)

## 2017-10-16 NOTE — Progress Notes (Signed)
STROKE TEAM PROGRESS NOTE   INTERVAL HISTORY His Rn is at the bedside.   No changes. Condition is stable. Carotid ultrasound shows no significant stenosis. Hemoglobin A1c is elevated at 13.4. CBC:  CBC Latest Ref Rng & Units 10/14/2017 10/14/2017 10/13/2017  WBC 4.0 - 10.5 K/uL 8.8 - 10.5  Hemoglobin 13.0 - 17.0 g/dL 13.6 - 16.8  Hematocrit 37.5 - 51.0 % 39.4 39.1 48.5  Platelets 150 - 400 K/uL 281 - 243     Comprehensive Metabolic Panel:   CMP Latest Ref Rng & Units 10/14/2017 10/13/2017 10/12/2017  Glucose 65 - 99 mg/dL 164(H) 170(H) 141(H)  BUN 6 - 20 mg/dL 55(H) 57(H) 59(H)  Creatinine 0.61 - 1.24 mg/dL 1.09 1.02 1.00  Sodium 135 - 145 mmol/L 133(L) 134(L) 142  Potassium 3.5 - 5.1 mmol/L 4.1 4.0 4.4  Chloride 101 - 111 mmol/L 106 107 109  CO2 22 - 32 mmol/L 17(L) 18(L) 22  Calcium 8.9 - 10.3 mg/dL 8.4(L) 8.8(L) 9.6  Total Protein 6.4 - 8.3 g/dL - - -  Total Bilirubin 0.20 - 1.20 mg/dL - - -  Alkaline Phos 25 - 125 - - -  AST 14 - 40 - - -  ALT 10 - 40 - - -   Lipid Panel:     Component Value Date/Time   CHOL 94 10/16/2017 0504   TRIG 88 10/16/2017 0504   HDL 26 (L) 10/16/2017 0504   CHOLHDL 3.6 10/16/2017 0504   VLDL 18 10/16/2017 0504   LDLCALC 50 10/16/2017 0504   HgbA1c:  Lab Results  Component Value Date   HGBA1C 13.4 (H) 10/13/2017   Urine Drug Screen: No results found for: LABOPIA, COCAINSCRNUR, LABBENZ, AMPHETMU, THCU, LABBARB  Alcohol Level No results found for: [facility]  Shane Brain Wo Contrast  Result Date: 10/14/2017 CLINICAL DATA:  Generalized weakness with multiple falls. Somnolence. History of urothelial carcinoma. EXAM: MRI HEAD WITHOUT CONTRAST TECHNIQUE: Multiplanar, multiecho pulse sequences of the brain and surrounding structures were obtained without intravenous contrast. COMPARISON:  Head CT 10/12/2017 FINDINGS: Brain: There is a punctate focus of restricted diffusion within the inferior cerebellum on the right consistent with a small vessel infarction. No other  acute infarction. Elsewhere, brainstem and cerebellum appear normal. Cerebral hemispheres show mild to moderate chronic small-vessel ischemic changes of the deep and subcortical white matter. No cortical or large vessel territory infarction. No mass lesion, hemorrhage, hydrocephalus or extra-axial collection. Contrast was not utilized. Vascular: Major vessels at the base of the brain show flow. However, there may be abnormal flow in the distal right vertebral artery. Skull and upper cervical spine: Negative Sinuses/Orbits: Clear/normal Other: None IMPRESSION: Punctate acute infarction within the inferior cerebellum on the right. Possible abnormal flow in the distal right vertebral artery. Mild age related atrophy and cerebral hemispheric chronic white matter ischemic changes. No sign of metastatic disease. Electronically Signed   By: Nelson Chimes M.D.   On: 10/14/2017 16:39   Shane [name] Head Wo Contrast  Result Date: 10/14/2017 CLINICAL DATA:  82 y/o  M; stroke for follow-up. EXAM: MRA HEAD WITHOUT CONTRAST TECHNIQUE: Angiographic images of the Circle of Willis were obtained using MRA technique without intravenous contrast. COMPARISON:  10/14/2017 MRI of the head. FINDINGS: Internal carotid arteries:  Patent. Anterior cerebral arteries:  Patent. Middle cerebral arteries: Patent. Anatomic variant: Anterior and posterior communicating arteries not identified, likely hypoplastic or absent. Posterior cerebral arteries:  Patent. Basilar artery:  Patent. Vertebral arteries:  Patent. No evidence of high-grade stenosis, large  vessel occlusion, or aneurysm identified. Mild motion artifact. IMPRESSION: Mild motion artifact. Patent anterior and posterior intracranial circulation. No large vessel occlusion, aneurysm, or significant stenosis. Electronically Signed   By: Kristine Garbe M.D.   On: 10/14/2017 19:18     Vitals:   10/16/17 0906 10/16/17 1000 10/16/17 1100 10/16/17 1200  BP: 110/67 112/69 118/66 119/67   Pulse: 71 63 68 64  Resp: 16 11 17 16   Temp: 98.2 F (36.8 C)     TempSrc: Oral     SpO2: 98% 97% 99% 99%  Weight:      Height:       General: Patient is awake alert in no acute distress H ENT: Normocephalic atraumatic dry mucous membranes Lungs clear to auscultation Cardiovascular: S1-S2 heard regular rate rhythm Extremities: Warm well perfused Neurological exam Patient is awake, alert, he is oriented to time place and person. He has decreased spontaneous speech output. He can answer questions, follow 1 and 2 step commands, name and repeat. Spontaneous speech is nonsensical. He has 5 min recall 2/3. Poor attention concentration. Speech is clear. Cranial nerves: Pupils equal round reactive to light, extra ocular movement intact, visual fields full, face symmetric, facial sensation intact, auditory acuity intact, palate elevates symmetrically, tongue midline. Motor exam: No vertical drift noted in all fours.  At least 4+/5 in all 4 extremities. Sensory: Intact light touch all over with no extinction Coordination: Intact finger nose finger bilaterally. Gait was not tested   ASSESSMENT/PLAN Shane [name] is a 82 y.o. male with history of Coronary artery disease, diabetes (noncompliant with medications), and bladder cancer admitted with generalized weakness and falls, confused and encephalopathic. Found to be in DKA, have an UTI, hypotension, AKI and metabolic encephalopathy. MRI done as part of his encephalopathy. workup revealed an acute punctate right cerebellar infarct. He was transferred from [place] to [facility].  Stroke:  Incidental Punctate R cerebellar lacunar infarct. Altered mental status likely related to hyperglycemia and dehydration  MRI  Punctate right cerebellar infarct  MRA   Unremarkable  Carotid Doppler  No stenosis  2D Echo  pending   LDL 45   Lovenox 40 mg sq daily for VTE prophylaxis Fall precautions Diet Carb Modified Fluid consistency:  Thin; Room service appropriate? Yes Diet Carb Modified  No antithrombotic prior to admission, now on aspirin 325 mg daily. Given mild stroke , will place on aspirin 81 mg and plavix 75 mg daily x 3 weeks, then aspirin alone. Orders adjusted.   Therapy recommendations:  No therapy needs at [facility]. Rolling walker recommended  Disposition:  Anticipate home with wife  Hypertension  Stable . Okay to resume home blood pressure medicines with BP goal normotensive  Hyperlipidemia  Home meds:  Pravachol 10 mg and lovaza, resumed in hospital  LDL pending, goal < 70  Continue statin at discharge  Diabetes type II DKA  HgbA1c 13.4, goal < 7.0  Uncontrolled - noncompliant with medications 1 year  Has been on insulin drip, now on Lantus  Diabetes coordinator following with recommendations  Other Stroke Risk Factors  Advanced age  HIV negative  Obesity, Body mass index is 31.81 kg/m.  Coronary artery disease  Other Active Problems  UTI, present on admission  Acute metabolic encephalopathy. Question baseline dementia. B12 elevated at 946. TSH and RPR were normal.   Acute kidney injury resolved with IV fluids  Hospital day # 4     He presented with confusion and encephalopathy secondary to diabetic ketoacidosis  and dehydration. MRI scan shows a tiny cerebellar infarct which is likely incidental and elected to small vessel disease. . Discussed with Dr. Bonner Puna. Follow echo results.stroke team will sign off. Kindly call for questions if any.  Antony Contras, MD Medical Director [place] Pager: [phone] 10/16/2017 4:04 PM  To contact Stroke Continuity provider, please refer to [email or web address] After hours, contact General Neurology

## 2017-10-16 NOTE — Progress Notes (Signed)
Physical Therapy Treatment Patient Details Name: Shane [name] MRN: [id number] DOB: 07-07-1936 Today's Date: 10/16/2017    History of Present Illness 82 yo male admitted with DKA, weakness, UTI, confusion. Hx of Dm, HTN, falls, urothelial cancer, CAD. MRI which demonstrated punctate left cerebellar stroke.     PT Comments    Pt progressing towards his goals and is currently supervision for bed mobility, and min guard for transfers, ambulation and ascent/descent of 5 steps. PT continues to recommend HHPT to improve balance and strength for greater independence in navigating his home environment.      Follow Up Recommendations  Home health PT;Supervision/Assistance - 24 hour     Equipment Recommendations  Rolling walker with 5" wheels       Precautions / Restrictions Precautions Precautions: Fall Precaution Comments: urostomy Restrictions Weight Bearing Restrictions: No    Mobility  Bed Mobility Overal bed mobility: Needs Assistance Bed Mobility: Supine to Sit     Supine to sit: Supervision     General bed mobility comments: supervision for safety, vc for scooting forward to get feet on floor  Transfers Overall transfer level: Needs assistance Equipment used: Rolling walker (2 wheeled) Transfers: Sit to/from Stand Sit to Stand: Min guard         General transfer comment: min guard for safety, increased time and effort, vc for hand placement for powerup  Ambulation/Gait Ambulation/Gait assistance: Min guard Ambulation Distance (Feet): 150 Feet Assistive device: Rolling walker (2 wheeled) Gait Pattern/deviations: Step-through pattern Gait velocity: slowed Gait velocity interpretation: Below normal speed for age/gender General Gait Details: min guard for safety no overt LoB but requires heavy reliance on RW, vc for proximity to RW    Stairs Stairs: Yes   Stair Management: Two rails;Alternating pattern;Forwards Number of Stairs: 5 General stair  comments: slow, steady ascent/descent min guard for safety  Wheelchair Mobility    Modified Rankin (Stroke Patients Only) Modified Rankin (Stroke Patients Only) Pre-Morbid Rankin Score: Slight disability Modified Rankin: Moderately severe disability     Balance Overall balance assessment: History of Falls;Needs assistance   Sitting balance-Leahy Scale: Fair     Standing balance support: Bilateral upper extremity supported Standing balance-Leahy Scale: Poor                              Cognition Arousal/Alertness: Awake/alert Behavior During Therapy: Flat affect Overall Cognitive Status: Impaired/Different from baseline Area of Impairment: Attention;Memory;Safety/judgement;Awareness;Problem solving                   Current Attention Level: Selective     Safety/Judgement: Decreased awareness of safety Awareness: Emergent Problem Solving: Slow processing General Comments: Has difficulty remembering the names of his granddaughters             Pertinent Vitals/Pain Pain Assessment: No/denies pain           PT Goals (current goals can now be found in the care plan section) Acute Rehab PT Goals Patient Stated Goal: home PT Goal Formulation: With patient/family Time For Goal Achievement: 10/27/17 Potential to Achieve Goals: Good Progress towards PT goals: Progressing toward goals    Frequency    Min 3X/week      PT Plan Current plan remains appropriate       AM-PAC PT "6 Clicks" Daily Activity  Outcome Measure  Difficulty turning over in bed (including adjusting bedclothes, sheets and blankets)?: A Little Difficulty moving from lying on back to sitting on  the side of the bed? : A Little Difficulty sitting down on and standing up from a chair with arms (e.g., wheelchair, bedside commode, etc,.)?: Unable Help needed moving to and from a bed to chair (including a wheelchair)?: A Little Help needed walking in hospital room?: A  Little Help needed climbing 3-5 steps with a railing? : A Lot 6 Click Score: 15    End of Session Equipment Utilized During Treatment: Gait belt Activity Tolerance: Patient tolerated treatment well Patient left: in chair;with call bell/phone within reach Nurse Communication: Mobility status PT Visit Diagnosis: Muscle weakness (generalized) (M62.81);History of falling (Z91.81)     Time: 8592-7639 PT Time Calculation (min) (ACUTE ONLY): 20 min  Charges:  $Gait Training: 8-22 mins                    G Codes:       Shane [name] B. Shane [name] PT, DPT Acute Rehabilitation  [phone] Pager [phone]     [place] 10/16/2017, 1:30 PM

## 2017-10-16 NOTE — Plan of Care (Signed)
Nutrition Education Note  RD consulted for nutrition education regarding diabetes.   Lab Results  Component Value Date   HGBA1C 13.4 (H) 10/13/2017   RD spent considerable amount of time discussing the diabetic diet with the patient's spouse/daughter. THe patient himself was more withdrawn and is adamant that his diabetes "will go away" as soon as he leaves the hospital because apparently this has happened before.   His diet recall is notable for being high in milk, sweets (ice cream, shakes, smoothies, cookies) and bread. Family notes the patient has an affinity for sweets and does not eat as many vegetables or meats due to some difficulty chewing.  The majority of our conversation was focused on carb counting. The family is one that prepares a plethora of items themselves. They bake their own bread, can their own jelly/tomatoes, frequently make items in a blender etc. They were curious how to apply carb counting to heir homemade items. The DM coordinator had apparently given family recommendation for the patient to have 45-75g carb per meal. We went through various meals and foods and described how to calculate carb intake.   RD reviewed the "My Plate" Template. Ideally the patient's meals will be comprised of 25% protein, 50% veg and 25% carb. Reviewed the starchy vegetables.   Family notes the patient drinks a large quantity of milk. While this is certainly a better option than a soda or juice, it does contain sugars. RD recommended limiting intake to 1-2 glasses/day and trying to drink the milk as part of a meal, rather than just periodically throughout the day.   Specific food items we discussed in detail were pasta, rice, bread, smoothies, potatoes.   Expect Good compliance. Spouse and daughter were extremely hungry for information and had plethora of questions, more than RD had time to answer. They are quite motivated to improve the patients BGs. The patient on the other hand, was not  happy with our discussion and seems to be slightly in denial. He does not think he needs to change his habits. Family notes that because the patient is unable to walk on his own, he has minimal control over the items he eats. RD tried to help emphasize balance.   If additional nutrition issues arise, please re-consult RD.  Burtis Junes RD, LDN, CNSC Clinical Nutrition Available Tues-Sat via Pager: [phone] 10/16/2017 5:06 PM

## 2017-10-16 NOTE — Progress Notes (Signed)
Pt being discharged home via wheelchair with family. Pt alert and oriented x4. VSS. Pt c/o no pain at this time. No signs of respiratory distress. Education complete and care plans resolved. IV removed with catheter intact and pt tolerated well. No further issues at this time. Pt to follow up with PCP. Da Authement R, RN 

## 2017-10-16 NOTE — Evaluation (Signed)
Speech Language Pathology Evaluation Patient Details Name: Shane [name] MRN: [id number] DOB: May 01, 1936 Today's Date: 10/16/2017 Time: 7673-4193 SLP Time Calculation (min) (ACUTE ONLY): 25 min  Problem List:  Patient Active Problem List   Diagnosis Date Noted  . Cerebral thrombosis with cerebral infarction 10/15/2017  . Malnutrition of moderate degree 10/13/2017  . DKA (diabetic ketoacidoses) ([place]) 10/12/2017  . Port catheter in place 02/25/2017  . S/P ileal conduit ([place]) 10/17/2016  . Malignant neoplasm of urinary bladder ([place]) 05/01/2016  . Diabetes mellitus without complication ([place]) 79/08/4095  . UTI (urinary tract infection) 04/01/2016  . Hypertension   . Hematoma of bladder wall   . Hematuria 03/31/2016   Past Medical History:  Past Medical History:  Diagnosis Date  . Abnormal EKG 2014   This led to stress test which was abnl, which led to cardiology referral: cath showed small vessel dz of 2nd diag branch with 70-80% stenosis--preserved LV function  . Bilateral renal cysts    Non-complex (CT 03/2016): no enhancement, coarse calcifications, mass effect does give false appearance of hydro on non-contract series (followed by urol).  . Bladder cancer ([place]) 03/2016   Invasive high grade urothelial carcinoma.  No mets at dx.  Cystoscopy + bx and partial resection of tumor in hosp when admitted for gross hematuria and AUR.  Neoadj chemo 06/2016, then got cystoprostatectomy. (Urostomy--ileal conduit urinary diversion with refluxing anastamoses--has RLQ urostomy)-gets routine surveillance by urol (Dr. Tresa Moore), most recent 04/2017--no sign of recurrence--obs status  . CAD (coronary artery disease)    small vessel dz x 1 vessel on cath 2014: med mgmt  . Chronic calcific pancreatitis ([place]) 03/2016   CT: also small cyst in head of pancreas that needs 6 mo f/u imaging (09/2016)  . Diabetes mellitus without complication ([place])   . Heart murmur, systolic    Noted in prior PCP's records.   I recommended echocardiogram 07/2016 but pt declined.  [name] History of Bell's palsy   . History of chemotherapy   . Hyperlipidemia   . Hypertension   . Pancreas cyst 03/2016   Repeat imaging with MRI pancreas protocol 09/2016  . Umbilical hernia    Repaired when he got his bladder surgery.   Past Surgical History:  Past Surgical History:  Procedure Laterality Date  . CARDIAC CATHETERIZATION  02/09/2013   small vessel dz of 2nd diag branch with 70-80% stenosis--preserved LV function. Medical therapy rec'd. Agustin Cree, PA)  . CARDIOVASCULAR STRESS TEST  01/26/2013   No ischemia.  Wall motion abnormalities at inferior base suggesting small area of infarction.  EF 64%.  [name] CATARACT EXTRACTION    . cystoprostatectomy w/LN surgery  10/17/2016   Ileal conduit: this is an incontinent urine diversion that directs urine from the ureters through a segment of isolated bowel to the surface of the abdominal wall via a cutaneous stoma, and urine drains continuously into an external ostomy appliance.  . CYSTOSCOPY WITH INJECTION N/A 10/17/2016   Procedure: CYSTOSCOPY WITH INJECTION OF INDOCYANINE GREEN DYE;  Surgeon: Alexis Frock, MD;  Location: [facility];  Service: Urology;  Laterality: N/A;  . EYE SURGERY     cataract surgery bilat   . IR GENERIC HISTORICAL  05/12/2016   IR US GUIDE VASC ACCESS RIGHT 05/12/2016 Greggory Keen, MD [facility]  . IR GENERIC HISTORICAL  05/12/2016   IR FLUORO GUIDE PORT INSERTION RIGHT 05/12/2016 Greggory Keen, MD [facility]  . IR REMOVAL TUN ACCESS W/ PORT W/O FL MOD SED  07/03/2017  . OSTOMY  urostomy  . TONSILLECTOMY AND ADENOIDECTOMY  Remote  . TRANSURETHRAL RESECTION OF PROSTATE N/A 04/02/2016   Procedure: CYSTOSCOPY, TRANSURETHRAL RESECTION OF BLADDER TUMOR;  Surgeon: [name], MD;  Location: [facility];  Service: Urology;  Laterality: N/A;  . UMBILICAL HERNIA REPAIR  10/2016  . VASECTOMY     HPI:  82 yo male admitted with DKA, weakness, UTI, confusion.  Hx of Dm, HTN, falls, urothelial cancer, CAD. MRI which demonstrated punctate left cerebellar stroke.    Assessment / Plan / Recommendation Clinical Impression   Pt presents with moderately severe cognitive-linguistic deficits.  Pt is oriented to self only and exhibits decreased sustained attention to tasks and intellectual awareness of his deficits which impact all higher levels of cognitive processes.  Pt's verbal expression is fluent and tangential with language of confusion.  Pt was slow to respond and his answers were often irrelevant to the questions being asked.  SLP is hopeful that as underlying infection is treated pt's mentation will clear; however, as pt is not yet back to baseline and has findings of acute cerebellar infarct , recommend ST follow up at next level of care.  Pt's wife was present throughout evaluation and was provided with strategies to maximize pt's independence for cognitive-linguistic function.  Reviewed and reinforced recommendations that pt have 24/7 supervision at discharge as well as assistance for medication and financial management.  All questions were answered to pt's and wife's satisfaction at this time.      SLP Assessment  SLP Recommendation/Assessment: All further Speech Lanaguage Pathology  needs can be addressed in the next venue of care SLP Visit Diagnosis: Cognitive communication deficit (R41.841)    Follow Up Recommendations  Home health SLP;24 hour supervision/assistance    Frequency and Duration           SLP Evaluation Cognition  Overall Cognitive Status: Impaired/Different from baseline Arousal/Alertness: Awake/alert Orientation Level: Oriented to person;Disoriented to place;Disoriented to time Attention: Sustained Sustained Attention: Impaired Sustained Attention Impairment: Functional basic;Verbal basic Memory: Impaired Memory Impairment: Storage deficit Awareness: Impaired Awareness Impairment: Intellectual impairment Problem Solving:  Impaired Problem Solving Impairment: Functional basic;Verbal basic Executive Function: (all impaired due to lower level underlying deficits) Behaviors: Poor frustration tolerance Safety/Judgment: Impaired       Comprehension  Auditory Comprehension Overall Auditory Comprehension: Other (comment)(impacted by cognition) Yes/No Questions: Within Functional Limits Commands: Impaired One Step Basic Commands: 75-100% accurate Two Step Basic Commands: 50-74% accurate    Expression Expression Primary Mode of Expression: Verbal Verbal Expression Overall Verbal Expression: Impaired Initiation: No impairment Level of Generative/Spontaneous Verbalization: Sentence;Conversation Pragmatics: Impairment Impairments: Topic maintenance;Abnormal affect   Oral / Motor  Oral Motor/Sensory Function Overall Oral Motor/Sensory Function: Within functional limits Motor Speech Overall Motor Speech: Appears within functional limits for tasks assessed   GO                    PageSelinda Orion 10/16/2017, 3:43 PM

## 2017-10-16 NOTE — Discharge Summary (Signed)
Physician Discharge Summary  Shane [name] TDV:[id number] DOB: [birth year] DOA: 10/12/2017  PCP: Tammi Sou, MD  Admit date: 10/12/2017 Discharge date: 10/16/2017  Admitted From: Home Disposition: Home   Recommendations for Outpatient Follow-up:  1. Follow up with PCP in the next week for diabetes management 2. Consider neuropsych evaluation.  Home Health: PT, OT Equipment/Devices: Rolloing walker, 3 in 1 Discharge Condition: Stable CODE STATUS: Full Diet recommendation: Carbohydrate modified  Brief/Interim Summary: Shane [name] is an 82 y.o. male with a history of bladder cancer s/p resection with ileal conduit and diabetes not compliant with medications who presented to the ED 4/1 for weakness, confusion, multiple falls, polyuria, polydipsia found to have DKA. He was admitted with insulin infusion per protocol with improvement on glucose, resolution of DKA and transition to basal-bolus insulin. Despite correction of metabolic derangements, he remained encephalopathic which was worked up including MRI which demonstrated punctate left cerebellar stroke. He was transferred to [facility] for neurological evaluation and stroke work up which showed no actionable findings. Fortunately mentation returned to baseline slowly over the next several days. HbA1c was 13.4%, so it is suspected that encephalopathy was related to precipitous improvement in long term hyperglycemia and possibly an abnormal baseline.  Discharge Diagnoses:  Active Problems:   DKA (diabetic ketoacidoses) (HCC)   Malnutrition of moderate degree   Cerebral thrombosis with cerebral infarction  Uncontrolled IDT2DM presenting with DKA: DKA resolved. - Will require basal-bolus insulin at discharge. Wife and daughter is engaged to facilitate this. Family received intensive education and written materials provided.  - HbA1c 13.4% - Will discharge on lantus 10 units and mealtime insulin, will need titration. - Appreciate  diabetes coordinator efforts.  Acute metabolic encephalopathy: Appears to have been precipitated by DKA and also possibly by the precipitous improvement in glucose from an average well above 352m/dl. Punctate cerebellar CVA may explain frequent falls but I don't believe this is primary contribution to confusion. B12, TSH, RPR, HIV levels wnl. Not consistent with withdrawal. Improvement with time argues for metabolic cause. - Consider outpatient neuropsychiatry evaluation, suspect some preexisting cognitive deficit.  Left cerebellar CVA:  - DAPT x3 weeks, then ASA alone per neurology - On statin, LDL 50. - Carotid doppler without significant stenosis, echo showed no cardioembolic source.  UTI: Culture not clonal. - Continue empiric Tx with keflex for total of 7 days.   AKI: Resolved  Hyperlipidemia:  - Statin  Hypotension: Possibly related to dehydration and lisinopril, improved with fluids.  - Stable.  History of urothelial cancer s/p resection and ileal conduit.  - WOC consulted, provided supplies for urostomy. - No other issues germane to hospitalization  Discharge Instructions Discharge Instructions    Ambulatory referral to Neurology   Complete by:  As directed    An appointment is requested in approximately: 6 weeks   Diet Carb Modified   Complete by:  As directed    Discharge instructions   Complete by:  As directed    You were admitted for DKA, a dangerous condition from uncontrolled diabetes. You have improved but will need to continue insulin at discharge.  - Take lantus 10 units daily  - Take novolog (fast-acting) insulin based on the scale provided with the printed prescription (based on the blood sugar values at the time of each meal) - You should check blood sugar first thing in the morning, then at meals three times daily. WRITE THESE NUMBERS DOWN and take that record to Dr. MAnitra Lauthearly next week.  -  Follow a limited carbohydrate diet.  - Take lisinopril  '5mg'$  once daily (to help protect kidneys from diabetes) - Take metformin as you were initially directed to help your body be responsive to insulin - Take keflex twice daily for the next couple days (5 total doses) to complete treatment for UTI - Take aspirin and plavix each daily for 3 weeks, then aspirin alone to prevent future strokes - You will be contacted to follow up with Dr. Leonie Man, neurology in about 6 weeks.  - If symptoms return, seek medical attention right away.   Increase activity slowly   Complete by:  As directed      Allergies as of 10/16/2017      Reactions   Other Swelling   Pt states had swelling of the eye when having eye surgery and the anesthetic placed in eye       Medication List    STOP taking these medications   omega-3 acid ethyl esters 1 g capsule Commonly known as:  LOVAZA     TAKE these medications   aspirin 325 MG tablet Take 1 tablet (325 mg total) by mouth daily. Start taking on:  10/17/2017   blood glucose meter kit and supplies Dispense based on patient and insurance preference. Use up to four times daily as directed. (FOR ICD-10 E10.9, E11.9).   cephALEXin 500 MG capsule Commonly known as:  KEFLEX Take 1 capsule (500 mg total) by mouth every 12 (twelve) hours.   clopidogrel 75 MG tablet Commonly known as:  PLAVIX Take 1 tablet (75 mg total) by mouth daily.   insulin aspart 100 UNIT/ML FlexPen Commonly known as:  NOVOLOG FLEXPEN CBG < 70: implement hypoglycemia protocol CBG 70 - 120: 3 units CBG 121 - 150: 5 units CBG 151 - 200: 6 units CBG 201 - 250: 8 units CBG 251 - 300: 11 units CBG 301 - 350: 14 units CBG 351 - 400: 18 units CBG > 400: call MD   insulin glargine 100 unit/mL Sopn Commonly known as:  LANTUS 10 U SQ daily What changed:  additional instructions   lisinopril 5 MG tablet Commonly known as:  PRINIVIL,ZESTRIL Take 1 tablet (5 mg total) by mouth daily. What changed:    medication strength  how much to  take  when to take this   metFORMIN 1000 MG tablet Commonly known as:  GLUCOPHAGE Take 1 tablet (1,000 mg total) by mouth 2 (two) times daily with a meal.   Pen Needles 32G X 4 MM Misc 1 each by Does not apply route daily. What changed:  Another medication with the same name was added. Make sure you understand how and when to take each.   Insulin Pen Needle 31G X 5 MM Misc Inject insulin via insulin pen 3 x daily with meals What changed:  You were already taking a medication with the same name, and this prescription was added. Make sure you understand how and when to take each.   polyethylene glycol packet Commonly known as:  MIRALAX / GLYCOLAX Take 17 g by mouth daily.   pravastatin 20 MG tablet Commonly known as:  PRAVACHOL Take 1 tablet (20 mg total) by mouth daily.            Durable Medical Equipment  (From admission, onward)        Start     Ordered   10/16/17 1313  DME 3-in-1  Once     10/16/17 1318   10/16/17 1312  For home  use only DME Walker rolling  [facility])  Once    Question:  Patient needs a walker to treat with the following condition  Answer:  Gait instability   10/16/17 1318     Follow-up Information    McGowen, [name], MD. Schedule an appointment as soon as possible for a visit in 3 day(s).   Specialty:  Family Medicine Contact information: [address] [place] [address] [phone]          Allergies  Allergen Reactions  . Other Swelling    Pt states had swelling of the eye when having eye surgery and the anesthetic placed in eye     Consultations:  Neurology  Procedures/Studies: Dg Chest 2 View  Result Date: 10/12/2017 CLINICAL DATA:  Elevated blood glucose EXAM: CHEST - 2 VIEW COMPARISON:  10/09/2017 FINDINGS: Heart size and vascularity normal. Negative for pneumonia. Lungs are clear. IMPRESSION: No active cardiopulmonary disease. Electronically Signed   By: Franchot Gallo M.D.   On: 10/12/2017 15:00   Dg Chest 2  View  Result Date: 10/09/2017 CLINICAL DATA:  Hypertension.  Diabetes.  History of bladder cancer. EXAM: CHEST - 2 VIEW COMPARISON:  03/31/2017. FINDINGS: Interim removal of right PowerPort catheter. Mediastinum and hilar structures normal. Mild right mid lung and left base pleuroparenchymal thickening consistent scarring again noted. No acute infiltrate. Heart size stable. No acute bony abnormality. Degenerative changes thoracic spine and both shoulders. IMPRESSION: 1.  Interim removal right PowerPort catheter. 2. Mild right mid lung and left base pleuroparenchymal thickening consistent with scarring. No acute cardiopulmonary disease. Electronically Signed   By: Marcello Moores  Register   On: 10/09/2017 14:12   Ct Head Wo Contrast  Result Date: 10/12/2017 CLINICAL DATA:  82 year old male with recent increased weakness and thirst. Hyperglycemia. Altered mental status. EXAM: CT HEAD WITHOUT CONTRAST TECHNIQUE: Contiguous axial images were obtained from the base of the skull through the vertex without intravenous contrast. COMPARISON:  No prior brain imaging. FINDINGS: Brain: Mild dystrophic calcifications in the bilateral globus pallidus and deep cerebellar nuclei. Cerebral volume is within normal limits for age. Mild dural calcifications. No midline shift, ventriculomegaly, mass effect, evidence of mass lesion, intracranial hemorrhage or evidence of cortically based acute infarction. Gray-white matter differentiation is within normal limits throughout the brain. No cortical encephalomalacia. Vascular: Calcified atherosclerosis at the skull base. No suspicious intracranial vascular hyperdensity. There is intracranial artery tortuosity, including the dominant appearing distal left vertebral artery. Skull: No acute osseous abnormality identified. Sinuses/Orbits: Hyperplastic and well pneumatized. Other: No acute orbit or scalp soft tissue finding. IMPRESSION: No acute intracranial abnormality. Normal for age non contrast  CT appearance of the brain. Electronically Signed   By: Genevie Ann M.D.   On: 10/12/2017 14:33   Mr Brain Wo Contrast  Result Date: 10/14/2017 CLINICAL DATA:  Generalized weakness with multiple falls. Somnolence. History of urothelial carcinoma. EXAM: MRI HEAD WITHOUT CONTRAST TECHNIQUE: Multiplanar, multiecho pulse sequences of the brain and surrounding structures were obtained without intravenous contrast. COMPARISON:  Head CT 10/12/2017 FINDINGS: Brain: There is a punctate focus of restricted diffusion within the inferior cerebellum on the right consistent with a small vessel infarction. No other acute infarction. Elsewhere, brainstem and cerebellum appear normal. Cerebral hemispheres show mild to moderate chronic small-vessel ischemic changes of the deep and subcortical white matter. No cortical or large vessel territory infarction. No mass lesion, hemorrhage, hydrocephalus or extra-axial collection. Contrast was not utilized. Vascular: Major vessels at the base of the brain show  flow. However, there may be abnormal flow in the distal right vertebral artery. Skull and upper cervical spine: Negative Sinuses/Orbits: Clear/normal Other: None IMPRESSION: Punctate acute infarction within the inferior cerebellum on the right. Possible abnormal flow in the distal right vertebral artery. Mild age related atrophy and cerebral hemispheric chronic white matter ischemic changes. No sign of metastatic disease. Electronically Signed   By: Nelson Chimes M.D.   On: 10/14/2017 16:39   Mr Jodene Nam Head Wo Contrast  Result Date: 10/14/2017 CLINICAL DATA:  82 y/o  M; stroke for follow-up. EXAM: MRA HEAD WITHOUT CONTRAST TECHNIQUE: Angiographic images of the Circle of Willis were obtained using MRA technique without intravenous contrast. COMPARISON:  10/14/2017 MRI of the head. FINDINGS: Internal carotid arteries:  Patent. Anterior cerebral arteries:  Patent. Middle cerebral arteries: Patent. Anatomic variant: Anterior and posterior  communicating arteries not identified, likely hypoplastic or absent. Posterior cerebral arteries:  Patent. Basilar artery:  Patent. Vertebral arteries:  Patent. No evidence of high-grade stenosis, large vessel occlusion, or aneurysm identified. Mild motion artifact. IMPRESSION: Mild motion artifact. Patent anterior and posterior intracranial circulation. No large vessel occlusion, aneurysm, or significant stenosis. Electronically Signed   By: Kristine Garbe M.D.   On: 10/14/2017 19:18    Echocardiogram 10/16/2017: - Left ventricle: The cavity size was normal. There was mild focal   basal hypertrophy of the septum. Systolic function was vigorous.   The estimated ejection fraction was in the range of 65% to 70%.   Wall motion was normal; there were no regional wall motion   abnormalities. There was an increased relative contribution of   atrial contraction to ventricular filling. Doppler parameters are   consistent with abnormal left ventricular relaxation (grade 1   diastolic dysfunction). - Aortic valve: Valve mobility was mildly restricted. Valve area   (VTI): 2 cm^2. Valve area (Vmax): 1.74 cm^2. Valve area (Vmean):   1.74 cm^2. - Mitral valve: Calcified annulus. - Atrial septum: There was increased thickness of the septum,   consistent with lipomatous hypertrophy. - Pulmonic valve: There was trivial regurgitation.  Subjective: No new complaints, mental status is clearing up near baseline per wife and daughter at bedside. No new deficits, walked with PT. Wife feels comfortable taking him home.   Discharge Exam: Vitals:   10/16/17 1100 10/16/17 1200  BP: 118/66 119/67  Pulse: 68 64  Resp: 17 16  Temp:    SpO2: 99% 99%   General: Pt is alert, awake, not in acute distress Cardiovascular: RRR, S1/S2 +, no rubs, no gallops Respiratory: CTA bilaterally, no wheezing, no rhonchi Abdominal: Soft, NT, ND, bowel sounds + Neuro: Alert, oriented to person and hospital, not date. Some  understanding of situation. 2 of 3 word short term recall. Subtle dysmetria in upper extremities without asterixis. No focal neurological deficits.  Psych: Judgement and insight appear impaired. Mood & affect appropriate.     Labs: BNP (last 3 results) No results for input(s): BNP in the last 8760 hours. Basic Metabolic Panel: Recent Labs  Lab 10/12/17 1244 10/12/17 1910 10/12/17 2359 10/13/17 0517 10/14/17 0413  NA 130* 137 142 134* 133*  K 4.2 3.3* 4.4 4.0 4.1  CL 99* 107 109 107 106  CO2 16* 19* 22 18* 17*  GLUCOSE 490* 213* 141* 170* 164*  BUN 74* 65* 59* 57* 55*  CREATININE 1.26* 1.07 1.00 1.02 1.09  CALCIUM 9.7 9.6 9.6 8.8* 8.4*  MG  --  2.4  --   --   --  Liver Function Tests: No results for input(s): AST, ALT, ALKPHOS, BILITOT, PROT, ALBUMIN in the last 168 hours. No results for input(s): LIPASE, AMYLASE in the last 168 hours. No results for input(s): AMMONIA in the last 168 hours. CBC: Recent Labs  Lab 10/12/17 1244 10/13/17 0517 10/14/17 0413 10/14/17 1154  WBC 13.6* 10.5 8.8  --   HGB 16.4 16.8 13.6  --   HCT 45.0 48.5 39.1 39.4  MCV 84.1 84.2 85.2  --   PLT 372 243 281  --    Cardiac Enzymes: Recent Labs  Lab 10/12/17 1244 10/12/17 1910 10/12/17 2359 10/13/17 0517  TROPONINI 0.05* 0.05* 0.06* 0.04*   BNP: Invalid input(s): POCBNP CBG: Recent Labs  Lab 10/15/17 0609 10/15/17 1121 10/15/17 1653 10/15/17 2122 10/16/17 0624  GLUCAP 169* 159* 224* 274* 144*   D-Dimer No results for input(s): DDIMER in the last 72 hours. Hgb A1c No results for input(s): HGBA1C in the last 72 hours. Lipid Profile Recent Labs    10/16/17 0504  CHOL 94  HDL 26*  LDLCALC 50  TRIG 88  CHOLHDL 3.6   Thyroid function studies Recent Labs    10/14/17 1154  TSH 1.084   Anemia work up Recent Labs    10/14/17 1154  VITAMINB12 946*   Urinalysis    Component Value Date/Time   COLORURINE YELLOW 10/12/2017 1257   APPEARANCEUR TURBID (A) 10/12/2017  1257   LABSPEC 1.012 10/12/2017 1257   PHURINE 6.0 10/12/2017 1257   GLUCOSEU >=500 (A) 10/12/2017 1257   HGBUR MODERATE (A) 10/12/2017 1257   BILIRUBINUR NEGATIVE 10/12/2017 1257   BILIRUBINUR negative 04/14/2016 1456   KETONESUR 5 (A) 10/12/2017 1257   PROTEINUR 30 (A) 10/12/2017 1257   UROBILINOGEN 0.2 04/14/2016 1456   NITRITE NEGATIVE 10/12/2017 1257   LEUKOCYTESUR MODERATE (A) 10/12/2017 1257    Microbiology Recent Results (from the past 240 hour(s))  Urine culture     Status: Abnormal   Collection Time: 10/12/17 12:57 PM  Result Value Ref Range Status   Specimen Description   Final    URINE, RANDOM Performed at [facility], [place] [address] [place], [place] [address]    Special Requests   Final    NONE Performed at [facility], [place] [address] [place], [place] [address]    Culture MULTIPLE SPECIES PRESENT, SUGGEST RECOLLECTION (A)  Final   Report Status 10/13/2017 FINAL  Final  MRSA PCR Screening     Status: None   Collection Time: 10/12/17  6:45 PM  Result Value Ref Range Status   MRSA by PCR NEGATIVE NEGATIVE Final    Comment:        The GeneXpert MRSA Assay (FDA approved for NASAL specimens only), is one component of a comprehensive MRSA colonization surveillance program. It is not intended to diagnose MRSA infection nor to guide or monitor treatment for MRSA infections. Performed at [facility], [place] [address], [place], [place] [address]     Time coordinating discharge: Approximately 40 minutes  Vance Gather, MD  [facility] 10/16/2017, 1:21 PM Pager [phone]

## 2017-10-16 NOTE — Care Management Note (Signed)
Case Management Note  Patient Details  Name: Shane [name] MRN: [id number] Date of Birth: 04/02/1936  Subjective/Objective:                    Action/Plan: Pt discharging home with orders for [facility] services. CM provided the patients wife choice and she selected [facility]. Butch Penny with [facility] notified and accepted the referral.  Pt with orders for walker and 3 in 1. Jermaine with [facility] DME notified and will deliver the equipment to the room.  Wife to provide transportation home and supervision at home.   Expected Discharge Date:  10/16/17               Expected Discharge Plan:  [place]  In-House Referral:     Discharge planning Services  CM Consult  Post Acute Care Choice:  Durable Medical Equipment, Home Health Choice offered to:  Spouse  DME Arranged:  3-N-1, Walker rolling DME Agency:  [place] Arranged:  PT, OT [facility] Agency:  [place]  Status of Service:  Completed, signed off  If discussed at [place] of Stay Meetings, dates discussed:    Additional Comments:  Pollie Friar, RN 10/16/2017, 2:10 PM

## 2017-10-16 NOTE — Progress Notes (Addendum)
Inpatient Diabetes Program Recommendations  AACE/ADA: New Consensus Statement on Inpatient Glycemic Control (2015)  Target Ranges:  Prepandial:   less than 140 mg/dL      Peak postprandial:   less than 180 mg/dL (1-2 hours)      Critically ill patients:  140 - 180 mg/dL   Results for Shane [name], Shane [name] (MRN [id number]) as of 10/16/2017 08:35  Ref. Range 10/15/2017 06:09 10/15/2017 11:21 10/15/2017 16:53 10/15/2017 21:22 10/16/2017 06:24  Glucose-Capillary Latest Ref Range: 65 - 99 mg/dL 169 (H) 159 (H) 224 (H) 274 (H) 144 (H)  Results for Shane [name], Shane [name] (MRN [id number]) as of 10/16/2017 08:35  Ref. Range 10/07/2017 14:01 10/13/2017 07:29  Hemoglobin A1C Latest Ref Range: 4.8 - 5.6 % 12.7 13.4 (H)   Review of Glycemic Control  Diabetes history: DM2 Outpatient Diabetes medications: Lantus 23 units QHS, Metformin 1000 mg BID (per home med list not taking Metformin) Current orders for Inpatient glycemic control: Lantus 10 units QHS, Novolog 0-15 units TID with meals, Novolog 0-5 units QHS, Novolog 2 units TID with meals for meal coverage  Inpatient Diabetes Program Recommendations: Insulin - Meal Coverage: Please consider increasing meal coverage to Novolog 3 units TID wtih meals for meal coverage if patient eats at least 50% of meals. A1C: A1C 13.4% on 10/13/2017. Patient was seen by Gordy Councilman, RD, CDE, Inpatient Diabetes Coordinator on 10/13/17 regarding DM control.  Addendum 10/16/17'@12'$ :30-Contacted by Nira Conn, RN regarding lack of DM education by patient's family. Spoke with patient, his wife, and his daughter about diabetes and home regimen for diabetes control. Patient was recently started on Lantus about 5 days prior to admission and they were told to increase Lantus dose by 1 unit per day until fasting improved and they were up to 23 units.  They have been checking patient's glucose daily but note that it is hard to get enough blood for glucose to read on glucometer. Patient's daughter reports that she  has a friend that uses the [employer] and wanted to know if it could be prescribed at discharge. Discussed FreeStyle Libre and explained how it could provide more data for patient's doctor to make additional changes with DM medications. Informed patient that a request would be made for MD to prescribe FreeStyle Libre reader 14 day and FreeStyle Libre 14 day sensors at time of discharge.   Discussed A1C results (13.4% on 10/13/2017) and explained that his current A1C indicates an average glucose of 338 mg/dl over the past 2-3 months. Discussed glucose and A1C goals. Discussed importance of checking CBGs and maintaining good CBG control to prevent long-term and short-term complications. Explained how hyperglycemia leads to damage within blood vessels which lead to the common complications seen with uncontrolled diabetes. Stressed the importance of improving glycemic control to prevent further complications from uncontrolled diabetes. Discussed impact of nutrition, exercise, stress, sickness, and medications on diabetes control. Patient has trouble chewing foods and the family purees his food and tries to provide soft foods and snacks.  Discussed carbohydrates, carbohydrate goals per day and meal, along with portion sizes. Patient's family usually cooks meals at home and would like as much education as possible about Carb Modified diet. Order for RD consult already ordered. Patient's daughter is usually the person to administer patient's insulin at home and has been using insulin pen. Reviewed how to properly use an insulin pen and patient's daughter was not doing the 2 unit prime (to prime the needle so that patient receives all the insulin dose prescribed).  Discussed Lantus and Novolog insulin (onset, duration) and how they are currently ordered. Informed family that a Living Well with DM book, an insulin starter kit, RD consult, and outpatient DM education would be ordered. Encouraged them to read through the  information to gain more knowledge on DM and insulin. Family very appreciative of information discussed and verbalized understanding and state they have no further questions at this time related to diabetes. Updated Nira Conn, RN on conversation and asked that she be sure patient's family receives educational material ordered prior to being discharged.  Thanks, Barnie Alderman, RN, MSN, CDE Diabetes Coordinator Inpatient Diabetes Program [phone] (Team Pager from 8am to 5pm)

## 2017-10-18 DIAGNOSIS — Z87440 Personal history of urinary (tract) infections: Secondary | ICD-10-CM | POA: Diagnosis not present

## 2017-10-18 DIAGNOSIS — R2689 Other abnormalities of gait and mobility: Secondary | ICD-10-CM | POA: Diagnosis not present

## 2017-10-18 DIAGNOSIS — I2510 Atherosclerotic heart disease of native coronary artery without angina pectoris: Secondary | ICD-10-CM | POA: Diagnosis not present

## 2017-10-18 DIAGNOSIS — Z794 Long term (current) use of insulin: Secondary | ICD-10-CM | POA: Diagnosis not present

## 2017-10-18 DIAGNOSIS — Z8673 Personal history of transient ischemic attack (TIA), and cerebral infarction without residual deficits: Secondary | ICD-10-CM | POA: Diagnosis not present

## 2017-10-18 DIAGNOSIS — G9341 Metabolic encephalopathy: Secondary | ICD-10-CM | POA: Diagnosis not present

## 2017-10-18 DIAGNOSIS — Z87891 Personal history of nicotine dependence: Secondary | ICD-10-CM | POA: Diagnosis not present

## 2017-10-18 DIAGNOSIS — K861 Other chronic pancreatitis: Secondary | ICD-10-CM | POA: Diagnosis not present

## 2017-10-18 DIAGNOSIS — Z8551 Personal history of malignant neoplasm of bladder: Secondary | ICD-10-CM | POA: Diagnosis not present

## 2017-10-18 DIAGNOSIS — E440 Moderate protein-calorie malnutrition: Secondary | ICD-10-CM | POA: Diagnosis not present

## 2017-10-18 DIAGNOSIS — I10 Essential (primary) hypertension: Secondary | ICD-10-CM | POA: Diagnosis not present

## 2017-10-18 DIAGNOSIS — E1165 Type 2 diabetes mellitus with hyperglycemia: Secondary | ICD-10-CM | POA: Diagnosis not present

## 2017-10-18 DIAGNOSIS — Z9221 Personal history of antineoplastic chemotherapy: Secondary | ICD-10-CM | POA: Diagnosis not present

## 2017-10-18 DIAGNOSIS — L89152 Pressure ulcer of sacral region, stage 2: Secondary | ICD-10-CM | POA: Diagnosis not present

## 2017-10-18 DIAGNOSIS — Z9181 History of falling: Secondary | ICD-10-CM | POA: Diagnosis not present

## 2017-10-18 DIAGNOSIS — Z936 Other artificial openings of urinary tract status: Secondary | ICD-10-CM | POA: Diagnosis not present

## 2017-10-18 DIAGNOSIS — E785 Hyperlipidemia, unspecified: Secondary | ICD-10-CM | POA: Diagnosis not present

## 2017-10-19 ENCOUNTER — Telehealth: Payer: Self-pay | Admitting: Family Medicine

## 2017-10-19 ENCOUNTER — Telehealth: Payer: Self-pay

## 2017-10-19 ENCOUNTER — Encounter: Payer: Self-pay | Admitting: Family Medicine

## 2017-10-19 NOTE — Telephone Encounter (Signed)
Please advise. Thanks.  

## 2017-10-19 NOTE — Telephone Encounter (Addendum)
Phone number below [phone]) is not a working number.  Called [facility] to get correct contact number for [facility], correct number is [phone].  Larene Beach advised and voiced understanding.

## 2017-10-19 NOTE — Telephone Encounter (Signed)
Copied from [place]. Topic: Quick Communication - See Telephone Encounter >> Oct 19, 2017 10:59 AM Ether Griffins B wrote: CRM for notification. See Telephone encounter for: 10/19/17. Larene Beach RN with [facility] requesting verbal orders for nursing 1x 4 weeks, 1x every other week for 5 weeks for diabetes and CVA management. Call back number [phone].

## 2017-10-19 NOTE — Consult Note (Signed)
            [facility] CM Primary Care Navigator  10/19/2017  Shane [name] 29-Apr-1936 [id number]  Attempt to seepatient at the bedsideto identify possible discharge needs but he was alreadydischarged.  Patient was discharged home with home health services towards the weekend.  Per MD note,patient was seen and treated for diabetic ketoacidosis, malnutrition and cerebral thrombosis with infarction.  Primary care provider's office is listed as providing transition of care (TOC).  Primary care provider's office called Maudie Mercury B.) to notify of patient's discharge and need for post hospital follow-up and transition of care. Notified of health issues needing follow-up (mainly uncontrolled diabetes and stoke). Was informed that patient has follow-up appointment scheduled with Dr. Anitra Lauth (PCP) on Wednesday 4/10.  Made aware to refer patient to [facility] care management as deemed necessary or appropriate for any services.  Patient was followed-up by Inpatient diabetes coordinator during this admission.  Notedwithorder for EMMI Stroke calls to follow-up recovery at home already in place.   For additional questions please contact:  Edwena Felty A. Ashad Fawbush, BSN, RN-BC [facility] PRIMARY CARE Navigator Cell: [phone]

## 2017-10-19 NOTE — Telephone Encounter (Signed)
I authorize all these orders.-thx

## 2017-10-19 NOTE — Telephone Encounter (Signed)
LM requesting call back to complete TCM and confirm hosp f/u.

## 2017-10-20 ENCOUNTER — Telehealth: Payer: Self-pay | Admitting: Family Medicine

## 2017-10-20 DIAGNOSIS — G9341 Metabolic encephalopathy: Secondary | ICD-10-CM | POA: Diagnosis not present

## 2017-10-20 DIAGNOSIS — E1165 Type 2 diabetes mellitus with hyperglycemia: Secondary | ICD-10-CM | POA: Diagnosis not present

## 2017-10-20 DIAGNOSIS — Z8673 Personal history of transient ischemic attack (TIA), and cerebral infarction without residual deficits: Secondary | ICD-10-CM | POA: Diagnosis not present

## 2017-10-20 DIAGNOSIS — I2510 Atherosclerotic heart disease of native coronary artery without angina pectoris: Secondary | ICD-10-CM | POA: Diagnosis not present

## 2017-10-20 DIAGNOSIS — R2689 Other abnormalities of gait and mobility: Secondary | ICD-10-CM | POA: Diagnosis not present

## 2017-10-20 DIAGNOSIS — L89152 Pressure ulcer of sacral region, stage 2: Secondary | ICD-10-CM | POA: Diagnosis not present

## 2017-10-20 NOTE — Telephone Encounter (Signed)
Please advise. Thanks.  

## 2017-10-20 NOTE — Telephone Encounter (Signed)
LM requesting CB to complete TCM and confirm hospital f/u.

## 2017-10-20 NOTE — Telephone Encounter (Signed)
Copied from [place] [phone]. Topic: Quick Communication - See Telephone Encounter >> Oct 20, 2017  1:21 PM Ether Griffins B wrote: CRM for notification. See Telephone encounter for: 10/20/17. Elsie Lincoln PT with [facility] needing verbal order for PT 2x 3 weeks. CB# [phone]

## 2017-10-21 ENCOUNTER — Ambulatory Visit (INDEPENDENT_AMBULATORY_CARE_PROVIDER_SITE_OTHER): Payer: Medicare Other | Admitting: Family Medicine

## 2017-10-21 ENCOUNTER — Encounter: Payer: Self-pay | Admitting: Family Medicine

## 2017-10-21 VITALS — BP 95/64 | HR 90 | Temp 97.4°F | Resp 16 | Ht 67.0 in | Wt 190.5 lb

## 2017-10-21 DIAGNOSIS — R4701 Aphasia: Secondary | ICD-10-CM

## 2017-10-21 DIAGNOSIS — G9341 Metabolic encephalopathy: Secondary | ICD-10-CM | POA: Diagnosis not present

## 2017-10-21 DIAGNOSIS — E1110 Type 2 diabetes mellitus with ketoacidosis without coma: Secondary | ICD-10-CM

## 2017-10-21 DIAGNOSIS — I69320 Aphasia following cerebral infarction: Secondary | ICD-10-CM

## 2017-10-21 DIAGNOSIS — I639 Cerebral infarction, unspecified: Secondary | ICD-10-CM

## 2017-10-21 DIAGNOSIS — R2689 Other abnormalities of gait and mobility: Secondary | ICD-10-CM | POA: Diagnosis not present

## 2017-10-21 DIAGNOSIS — L89152 Pressure ulcer of sacral region, stage 2: Secondary | ICD-10-CM | POA: Diagnosis not present

## 2017-10-21 DIAGNOSIS — I2510 Atherosclerotic heart disease of native coronary artery without angina pectoris: Secondary | ICD-10-CM | POA: Diagnosis not present

## 2017-10-21 DIAGNOSIS — E1165 Type 2 diabetes mellitus with hyperglycemia: Secondary | ICD-10-CM | POA: Diagnosis not present

## 2017-10-21 DIAGNOSIS — Z8673 Personal history of transient ischemic attack (TIA), and cerebral infarction without residual deficits: Secondary | ICD-10-CM | POA: Diagnosis not present

## 2017-10-21 NOTE — Telephone Encounter (Signed)
Ronalee Belts advised and voiced understanding.

## 2017-10-21 NOTE — Progress Notes (Addendum)
10/21/2017  CC:  Chief Complaint  Patient presents with  . Hospitalization Follow-up    TCM    Patient is a 82 y.o. Caucasian male who presents accompanied by his wife and daughter for hospital follow up, specifically Transitional Care Services face-to-face visit. Dates hospitalized: 4/1-4/5, 2019. Days since d/c from hospital: 5d Patient was discharged from hospital to home. Reason for admission to hospital: DKA with metabolic encephalopathy. Date of interactive (phone) contact with patient and/or caregiver: Simonne Come, RN left messages on pt's VM on 4/8 and 4/9, 2019.  I have reviewed patient's discharge summary plus pertinent specific notes, labs, and imaging from the hospitalization.   Pt had a punctate left sided cerebellar acute CVA, neuro consulted, carotid doppler without significant stenosis, echo showed no cardioembolic source. DAPT recommended x 3 wks, then go to ASA alone. Outpt neuro consult ordered 10/16/17 by Dr. Bonner Puna, hospital MD. D/c neuro exam documents a bit of memory impairment, mild disorientation, and UE dysmetria. Neurologist's progress note 10/16/17 (day of d/c) documented that he could follow 1-2 step commands, name and repeat. He had decreased spontaneous speech, with spontaneous speech nonsensical.  5 min recall 2/3.  Poor attention/concentration. Speech clear. Neurologist's impression was that pt's acute encephalopathy was likely related to hyperglycemia and dehydration.  Incidental punctate R cerebellar infarct felt to be noncontributory.    Pt says he feels pretty good.  Says he is eating and drinking ok.  He is not standing up much, essentially confined to WC at times, then other times he can get up OOB w/out assistance and walk to bathroom, use the toilet, then return to bed..  When asked if he feels weak, he displays extreme difficulty in trying to answer---took 2-3 min of struggling to speak and still never answers question.  Seems to have expressive  aphasia.  Per daughter, when struggling to find a word he sometimes puts in a color in his sentence.  Wife says some days he is pretty lucid. Has no evidence of memory loss per daughter and wife.  Seems to fixate on everything that has happened recently is a result of "things that happened in [place]", his former home.  Apparently he is referring to his dx of bladder cancer. When making attempts to stand he seems to communicate that the message just won't get from his brain to his legs, but this is only intermittently a problem All these reported functional deficits were present to some extent while in hospital, but daughter and wife do say that he is a bit worse in this regard since going home from hospital.  [place], PT, OT, speech therapy all being arranged/are arranged.  DM: taking 10 U lantus  3 U qAM, 6 U qlunch, 5-10 U q supper. Trying to get 30g carbs each meal. Fastings 100-150 range, low 77.  Later in the day up to 150s-160s for the most part.   Medication reconciliation was done today and patient is taking meds as recommended by discharging hospitalist/specialist.    PMH:  Past Medical History:  Diagnosis Date  . Abnormal EKG 2014   This led to stress test which was abnl, which led to cardiology referral: cath showed small vessel dz of 2nd diag branch with 70-80% stenosis--preserved LV function  . Bilateral renal cysts    Non-complex (CT 03/2016): no enhancement, coarse calcifications, mass effect does give false appearance of hydro on non-contract series (followed by urol).  . Bladder cancer ([place]) 03/2016   Invasive high grade urothelial carcinoma.  No mets at dx.  Cystoscopy + bx and partial resection of tumor in hosp when admitted for gross hematuria and AUR.  Neoadj chemo 06/2016, then got cystoprostatectomy. (Urostomy--ileal conduit urinary diversion with refluxing anastamoses--has RLQ urostomy)-gets routine surveillance by urol (Dr. Tresa Moore), most recent 04/2017--no sign of  recurrence--obs status  . CAD (coronary artery disease)    small vessel dz x 1 vessel on cath 2014: med mgmt  . Cerebellar cerebrovascular accident (CVA) without late effect 10/2017   Left, punctate: DAPT therapy x 3 wks per Dr. Leonie Man, neuro, then ASA alone.  . Chronic calcific pancreatitis ([place]) 03/2016   CT: also small cyst in head of pancreas that needs 6 mo f/u imaging (09/2016)  . Diabetes mellitus without complication ([place])    Insulin dependent; DKA admission 10/2017.  [name] Heart murmur, systolic    Noted in prior PCP's records.  I recommended echocardiogram 07/2016 but pt declined.  [name] History of Bell's palsy   . History of chemotherapy   . Hyperlipidemia   . Hypertension   . Pancreas cyst 03/2016   Repeat imaging with MRI pancreas protocol 09/2016  . Umbilical hernia    Repaired when he got his bladder surgery.    PSH:  Past Surgical History:  Procedure Laterality Date  . CARDIAC CATHETERIZATION  02/09/2013   small vessel dz of 2nd diag branch with 70-80% stenosis--preserved LV function. Medical therapy rec'd. Agustin Cree, PA)  . CARDIOVASCULAR STRESS TEST  01/26/2013   No ischemia.  Wall motion abnormalities at inferior base suggesting small area of infarction.  EF 64%.  . CAROTID ARTERY DOPPLERS  10/15/2017   1-39% stenosis R ICA, no stenosis L ICA.  [name] CATARACT EXTRACTION    . cystoprostatectomy w/LN surgery  10/17/2016   Ileal conduit: this is an incontinent urine diversion that directs urine from the ureters through a segment of isolated bowel to the surface of the abdominal wall via a cutaneous stoma, and urine drains continuously into an external ostomy appliance.  . CYSTOSCOPY WITH INJECTION N/A 10/17/2016   Procedure: CYSTOSCOPY WITH INJECTION OF INDOCYANINE GREEN DYE;  Surgeon: Alexis Frock, MD;  Location: [facility] ORS;  Service: Urology;  Laterality: N/A;  . EYE SURGERY     cataract surgery bilat   . IR GENERIC HISTORICAL  05/12/2016   IR US GUIDE VASC ACCESS RIGHT  05/12/2016 Greggory Keen, MD [facility]-INTERV RAD  . IR GENERIC HISTORICAL  05/12/2016   IR FLUORO GUIDE PORT INSERTION RIGHT 05/12/2016 Greggory Keen, MD [facility]-INTERV RAD  . IR REMOVAL TUN ACCESS W/ PORT W/O FL MOD SED  07/03/2017  . OSTOMY     urostomy  . TONSILLECTOMY AND ADENOIDECTOMY  Remote  . TRANSTHORACIC ECHOCARDIOGRAM  10/16/2017   EF 65-70%, normal wall motion, grd I DD.  [name] TRANSURETHRAL RESECTION OF PROSTATE N/A 04/02/2016   Procedure: CYSTOSCOPY, TRANSURETHRAL RESECTION OF BLADDER TUMOR;  Surgeon: [name], MD;  Location: [facility] ORS;  Service: Urology;  Laterality: N/A;  . UMBILICAL HERNIA REPAIR  10/2016  . VASECTOMY      MEDS:  Outpatient Medications Prior to Visit  Medication Sig Dispense Refill  . aspirin 325 MG tablet Take 1 tablet (325 mg total) by mouth daily. 30 tablet 0  . blood glucose meter kit and supplies Dispense based on patient and insurance preference. Use up to four times daily as directed. (FOR ICD-10 E10.9, E11.9). 1 each 0  . clopidogrel (PLAVIX) 75 MG tablet Take 1 tablet (75 mg total) by mouth daily. 30 tablet 0  .  insulin aspart (NOVOLOG FLEXPEN) 100 UNIT/ML FlexPen CBG < 70: implement hypoglycemia protocol CBG 70 - 120: 3 units CBG 121 - 150: 5 units CBG 151 - 200: 6 units CBG 201 - 250: 8 units CBG 251 - 300: 11 units CBG 301 - 350: 14 units CBG 351 - 400: 18 units CBG > 400: call MD 15 mL 0  . insulin glargine (LANTUS) 100 unit/mL SOPN 10 U SQ daily 15 mL 11  . Insulin Pen Needle (PEN NEEDLES) 32G X 4 MM MISC 1 each by Does not apply route daily. 100 each 11  . Insulin Pen Needle 31G X 5 MM MISC Inject insulin via insulin pen 3 x daily with meals 100 each 0  . lisinopril (PRINIVIL,ZESTRIL) 5 MG tablet Take 1 tablet (5 mg total) by mouth daily. 30 tablet 0  . polyethylene glycol (MIRALAX / GLYCOLAX) packet Take 17 g by mouth daily.    . pravastatin (PRAVACHOL) 20 MG tablet Take 1 tablet (20 mg total) by mouth daily. 30 tablet 0  . cephALEXin  (KEFLEX) 500 MG capsule Take 1 capsule (500 mg total) by mouth every 12 (twelve) hours. (Patient not taking: Reported on 10/21/2017) 5 capsule 0  . metFORMIN (GLUCOPHAGE) 1000 MG tablet Take 1 tablet (1,000 mg total) by mouth 2 (two) times daily with a meal. (Patient not taking: Reported on 10/21/2017) 60 tablet 0   No facility-administered medications prior to visit.    EXAM: Gen: pt alert, sitting in wheelchair, seems to have very little spontaneous speech. He attends well, follows 2-3 step commands.  Significant expressive aphasia/nonsensical speech when asked a question. XNA:TFTD: no injection, icteris, swelling, or exudate.  EOMI, PERRLA. Mouth: lips without lesion/swelling.  Oral mucosa pink and moist. Oropharynx without erythema, exudate, or swelling.  CV: RRR, no m/r/g.   LUNGS: CTA bilat, nonlabored resps, good aeration in all lung fields. ABD: soft, NT/ND EXT: no clubbing, cyanosis, or edema.  Neuro: CN 2-12 intact bilaterally, strength 5/5 in proximal and distal upper extremities and lower extremities bilaterally.  No sensory deficits.  No tremor.  FNF and Heel-shin-ankle normal bilat.  Pt unable to get out of wheelchair and walk, so gait was not assessed.  Question of VERY subtle left pronator drift.   Pertinent labs/imaging Lab Results  Component Value Date   TSH 1.084 10/14/2017   Lab Results  Component Value Date   WBC 8.8 10/14/2017   HGB 13.6 10/14/2017   HCT 39.4 10/14/2017   MCV 85.2 10/14/2017   PLT 281 10/14/2017   Lab Results  Component Value Date   CREATININE 1.09 10/14/2017   BUN 55 (H) 10/14/2017   NA 133 (L) 10/14/2017   K 4.1 10/14/2017   CL 106 10/14/2017   CO2 17 (L) 10/14/2017   Lab Results  Component Value Date   ALT 10 10/03/2017   AST 9 (A) 10/03/2017   ALKPHOS 95 10/03/2017   BILITOT 0.40 06/18/2017   Lab Results  Component Value Date   CHOL 94 10/16/2017   Lab Results  Component Value Date   HDL 26 (L) 10/16/2017   Lab Results   Component Value Date   LDLCALC 50 10/16/2017   Lab Results  Component Value Date   TRIG 88 10/16/2017   Lab Results  Component Value Date   CHOLHDL 3.6 10/16/2017   Lab Results  Component Value Date   HGBA1C 13.4 (H) 10/13/2017   CT head w/out contrast 10/12/17: IMPRESSION: No acute intracranial abnormality. Normal for age  non contrast CT appearance of the brain  MRA head w/out contrast 10/14/17:  IMPRESSION: Mild motion artifact. Patent anterior and posterior intracranial circulation. No large vessel occlusion, aneurysm, or significant Stenosis  MR brain w/o contrast 10/14/17 IMPRESSION: Punctate acute infarction within the inferior cerebellum on the right. Possible abnormal flow in the distal right vertebral artery. Mild age related atrophy and cerebral hemispheric chronic white matter ischemic changes. No sign of metastatic disease.  ASSESSMENT/PLAN:  Hospital f/u:  1) DKA; treated with insulin drip and transitioned to lantus hs and novolog at mealtime at home. Glucoses are good, had only 1 low-normal gluc (77) fasting AM---I will be conservative and have them decrease lantus to 9 units qhs.  Continue current humalog sliding scale at mealtime.  Continue home gluc monitoring. Check BMET today.  2) Encephalopathy: ? Incompletely resolved.  Has some expressive aphasia that seems to be intermittent per wife. He'll continue with PT/OT/Speech therapies at home. Needs hospital bed due to immobility and wife's report of 2 early pressure ulcers on his bottom. Neuro referral ordered by hospitalist 10/16/17---apparently message left on VM yesterday per referral info in EPIC. Gave family # for [facility] so they can arrange appt with Dr. Leonie Man, the neurologist who saw him in hospital.  3) Acute punctate R cerebellar infarct: incidental per neuro.  ASA 81 qd + plavix 75 qd x 3 wks (end approx 4/25), then ASA 72m qd.  Lipid control with pravastatin.  BP is low  normal here today and I advised pt to stop lisinopril for now, monitor bp and HR at home---review at next o/v.  Medical decision making of high complexity was utilized today.  An After Visit Summary was printed and given to the patient.  FOLLOW UP:  2 weeks.  Signed:  PCrissie Sickles MD           10/21/2017   ADDENDUM 10/23/17: Regarding pt's need for hospital bed: his family has difficulty repositioning his body and changing position. Frequent and immediate need for change in body position needed to avoid pressure ulcers and relieve pain associated with position/immobility. Signed:  PCrissie Sickles MD           10/23/2017

## 2017-10-21 NOTE — Telephone Encounter (Signed)
Yes, this is fine.

## 2017-10-21 NOTE — Patient Instructions (Signed)
Stop lisinopril (BP med) Check bp and HR once daily and write numbers down for review with me at next office visit.  Decrease lantus to 9 units every night. Continue current sliding scaled insulin (novolog) at mealtimes.  Call [facility] at [phone] and inquire about setting up appt with Dr. Leonie Man.

## 2017-10-22 ENCOUNTER — Telehealth: Payer: Self-pay | Admitting: Family Medicine

## 2017-10-22 DIAGNOSIS — L89152 Pressure ulcer of sacral region, stage 2: Secondary | ICD-10-CM | POA: Diagnosis not present

## 2017-10-22 DIAGNOSIS — R2689 Other abnormalities of gait and mobility: Secondary | ICD-10-CM | POA: Diagnosis not present

## 2017-10-22 DIAGNOSIS — I2510 Atherosclerotic heart disease of native coronary artery without angina pectoris: Secondary | ICD-10-CM | POA: Diagnosis not present

## 2017-10-22 DIAGNOSIS — Z8673 Personal history of transient ischemic attack (TIA), and cerebral infarction without residual deficits: Secondary | ICD-10-CM | POA: Diagnosis not present

## 2017-10-22 DIAGNOSIS — G9341 Metabolic encephalopathy: Secondary | ICD-10-CM | POA: Diagnosis not present

## 2017-10-22 DIAGNOSIS — E1165 Type 2 diabetes mellitus with hyperglycemia: Secondary | ICD-10-CM | POA: Diagnosis not present

## 2017-10-22 LAB — BASIC METABOLIC PANEL
BUN: 42 mg/dL — ABNORMAL HIGH (ref 6–23)
CO2: 17 mEq/L — ABNORMAL LOW (ref 19–32)
Calcium: 8.4 mg/dL (ref 8.4–10.5)
Chloride: 103 mEq/L (ref 96–112)
Creatinine, Ser: 0.97 mg/dL (ref 0.40–1.50)
GFR: 79.14 mL/min (ref >60.00)
Glucose, Bld: 193 mg/dL — ABNORMAL HIGH (ref 70–99)
Potassium: 4.9 mEq/L (ref 3.5–5.1)
Sodium: 131 mEq/L — ABNORMAL LOW (ref 135–145)

## 2017-10-22 NOTE — Telephone Encounter (Signed)
This is definitely ok with me.

## 2017-10-22 NOTE — Telephone Encounter (Signed)
Please advise. Thanks.  

## 2017-10-22 NOTE — Telephone Encounter (Signed)
Tried calling, NA and unable to leave a message due to vm box being full.

## 2017-10-22 NOTE — Telephone Encounter (Signed)
Copied from [place] [phone]. Topic: Quick Communication - See Telephone Encounter >> Oct 22, 2017  3:19 PM Boyd Kerbs wrote: CRM for notification.   [place] [phone] verbal order Speech Therapy 2 x 3 weeks  See Telephone encounter for: 10/22/17.

## 2017-10-23 ENCOUNTER — Other Ambulatory Visit: Payer: Self-pay | Admitting: Family Medicine

## 2017-10-23 DIAGNOSIS — Z8673 Personal history of transient ischemic attack (TIA), and cerebral infarction without residual deficits: Secondary | ICD-10-CM | POA: Diagnosis not present

## 2017-10-23 DIAGNOSIS — G9341 Metabolic encephalopathy: Secondary | ICD-10-CM | POA: Diagnosis not present

## 2017-10-23 DIAGNOSIS — I2510 Atherosclerotic heart disease of native coronary artery without angina pectoris: Secondary | ICD-10-CM | POA: Diagnosis not present

## 2017-10-23 DIAGNOSIS — E1165 Type 2 diabetes mellitus with hyperglycemia: Secondary | ICD-10-CM | POA: Diagnosis not present

## 2017-10-23 DIAGNOSIS — L89152 Pressure ulcer of sacral region, stage 2: Secondary | ICD-10-CM | POA: Diagnosis not present

## 2017-10-23 DIAGNOSIS — R2689 Other abnormalities of gait and mobility: Secondary | ICD-10-CM | POA: Diagnosis not present

## 2017-10-23 NOTE — Telephone Encounter (Signed)
Please review and advise. Thanks.  

## 2017-10-26 DIAGNOSIS — L89152 Pressure ulcer of sacral region, stage 2: Secondary | ICD-10-CM | POA: Diagnosis not present

## 2017-10-26 DIAGNOSIS — G9341 Metabolic encephalopathy: Secondary | ICD-10-CM | POA: Diagnosis not present

## 2017-10-26 DIAGNOSIS — I2510 Atherosclerotic heart disease of native coronary artery without angina pectoris: Secondary | ICD-10-CM | POA: Diagnosis not present

## 2017-10-26 DIAGNOSIS — Z8673 Personal history of transient ischemic attack (TIA), and cerebral infarction without residual deficits: Secondary | ICD-10-CM | POA: Diagnosis not present

## 2017-10-26 DIAGNOSIS — E1165 Type 2 diabetes mellitus with hyperglycemia: Secondary | ICD-10-CM | POA: Diagnosis not present

## 2017-10-26 DIAGNOSIS — R2689 Other abnormalities of gait and mobility: Secondary | ICD-10-CM | POA: Diagnosis not present

## 2017-10-26 NOTE — Telephone Encounter (Signed)
Shane [name] advised and voiced understanding.

## 2017-10-27 DIAGNOSIS — G9341 Metabolic encephalopathy: Secondary | ICD-10-CM | POA: Diagnosis not present

## 2017-10-27 DIAGNOSIS — L89152 Pressure ulcer of sacral region, stage 2: Secondary | ICD-10-CM | POA: Diagnosis not present

## 2017-10-27 DIAGNOSIS — Z8673 Personal history of transient ischemic attack (TIA), and cerebral infarction without residual deficits: Secondary | ICD-10-CM | POA: Diagnosis not present

## 2017-10-27 DIAGNOSIS — I2510 Atherosclerotic heart disease of native coronary artery without angina pectoris: Secondary | ICD-10-CM | POA: Diagnosis not present

## 2017-10-27 DIAGNOSIS — E1165 Type 2 diabetes mellitus with hyperglycemia: Secondary | ICD-10-CM | POA: Diagnosis not present

## 2017-10-27 DIAGNOSIS — R2689 Other abnormalities of gait and mobility: Secondary | ICD-10-CM | POA: Diagnosis not present

## 2017-10-28 ENCOUNTER — Telehealth: Payer: Self-pay | Admitting: Family Medicine

## 2017-10-28 NOTE — Telephone Encounter (Signed)
Please advise. Thanks.  

## 2017-10-28 NOTE — Telephone Encounter (Signed)
Bruce aware of OT verbal orders.

## 2017-10-28 NOTE — Telephone Encounter (Unsigned)
Copied from [place] [phone]. Topic: Quick Communication - See Telephone Encounter >> Oct 28, 2017 11:33 AM Percell Belt A wrote: CRM for notification. See Telephone encounter for: 10/28/17. Bruce called in from [facility] [phone] [place] for OT  2 week 3

## 2017-10-28 NOTE — Telephone Encounter (Signed)
Yes I authorize this.

## 2017-10-29 DIAGNOSIS — Z8673 Personal history of transient ischemic attack (TIA), and cerebral infarction without residual deficits: Secondary | ICD-10-CM | POA: Diagnosis not present

## 2017-10-29 DIAGNOSIS — I2510 Atherosclerotic heart disease of native coronary artery without angina pectoris: Secondary | ICD-10-CM | POA: Diagnosis not present

## 2017-10-29 DIAGNOSIS — R2689 Other abnormalities of gait and mobility: Secondary | ICD-10-CM | POA: Diagnosis not present

## 2017-10-29 DIAGNOSIS — E1165 Type 2 diabetes mellitus with hyperglycemia: Secondary | ICD-10-CM | POA: Diagnosis not present

## 2017-10-29 DIAGNOSIS — G9341 Metabolic encephalopathy: Secondary | ICD-10-CM | POA: Diagnosis not present

## 2017-10-29 DIAGNOSIS — L89152 Pressure ulcer of sacral region, stage 2: Secondary | ICD-10-CM | POA: Diagnosis not present

## 2017-11-02 DIAGNOSIS — L89152 Pressure ulcer of sacral region, stage 2: Secondary | ICD-10-CM | POA: Diagnosis not present

## 2017-11-02 DIAGNOSIS — G9341 Metabolic encephalopathy: Secondary | ICD-10-CM | POA: Diagnosis not present

## 2017-11-02 DIAGNOSIS — I2510 Atherosclerotic heart disease of native coronary artery without angina pectoris: Secondary | ICD-10-CM | POA: Diagnosis not present

## 2017-11-02 DIAGNOSIS — Z8673 Personal history of transient ischemic attack (TIA), and cerebral infarction without residual deficits: Secondary | ICD-10-CM | POA: Diagnosis not present

## 2017-11-02 DIAGNOSIS — E1165 Type 2 diabetes mellitus with hyperglycemia: Secondary | ICD-10-CM | POA: Diagnosis not present

## 2017-11-02 DIAGNOSIS — R2689 Other abnormalities of gait and mobility: Secondary | ICD-10-CM | POA: Diagnosis not present

## 2017-11-03 DIAGNOSIS — E1165 Type 2 diabetes mellitus with hyperglycemia: Secondary | ICD-10-CM | POA: Diagnosis not present

## 2017-11-03 DIAGNOSIS — G9341 Metabolic encephalopathy: Secondary | ICD-10-CM | POA: Diagnosis not present

## 2017-11-03 DIAGNOSIS — L89152 Pressure ulcer of sacral region, stage 2: Secondary | ICD-10-CM | POA: Diagnosis not present

## 2017-11-03 DIAGNOSIS — I2510 Atherosclerotic heart disease of native coronary artery without angina pectoris: Secondary | ICD-10-CM | POA: Diagnosis not present

## 2017-11-03 DIAGNOSIS — R2689 Other abnormalities of gait and mobility: Secondary | ICD-10-CM | POA: Diagnosis not present

## 2017-11-03 DIAGNOSIS — Z8673 Personal history of transient ischemic attack (TIA), and cerebral infarction without residual deficits: Secondary | ICD-10-CM | POA: Diagnosis not present

## 2017-11-04 ENCOUNTER — Encounter: Payer: Self-pay | Admitting: Family Medicine

## 2017-11-04 ENCOUNTER — Ambulatory Visit (INDEPENDENT_AMBULATORY_CARE_PROVIDER_SITE_OTHER): Payer: Medicare Other | Admitting: Family Medicine

## 2017-11-04 VITALS — BP 126/50 | HR 88 | Temp 98.1°F | Resp 16 | Ht 67.0 in | Wt 199.0 lb

## 2017-11-04 DIAGNOSIS — E119 Type 2 diabetes mellitus without complications: Secondary | ICD-10-CM | POA: Diagnosis not present

## 2017-11-04 DIAGNOSIS — G9341 Metabolic encephalopathy: Secondary | ICD-10-CM

## 2017-11-04 DIAGNOSIS — R4701 Aphasia: Secondary | ICD-10-CM

## 2017-11-04 DIAGNOSIS — I10 Essential (primary) hypertension: Secondary | ICD-10-CM | POA: Diagnosis not present

## 2017-11-04 DIAGNOSIS — I6330 Cerebral infarction due to thrombosis of unspecified cerebral artery: Secondary | ICD-10-CM | POA: Diagnosis not present

## 2017-11-04 DIAGNOSIS — Z8673 Personal history of transient ischemic attack (TIA), and cerebral infarction without residual deficits: Secondary | ICD-10-CM

## 2017-11-04 MED ORDER — FREESTYLE LIBRE 14 DAY SENSOR MISC: 1.0000 | Freq: Every day | 5 refills | Status: DC

## 2017-11-04 MED ORDER — TERBINAFINE HCL 250 MG PO TABS: ORAL_TABLET | ORAL | 0 refills | Status: DC

## 2017-11-04 MED ORDER — INSULIN GLARGINE 100 UNITS/ML SOLOSTAR PEN: 9.0000 [IU] | PEN_INJECTOR | Freq: Every day | SUBCUTANEOUS | Status: DC

## 2017-11-04 NOTE — Progress Notes (Signed)
OFFICE VISIT  11/04/2017   CC:  Chief Complaint  Patient presents with  . Follow-up    RCI   HPI:    Patient is a 82 y.o. Caucasian male who presents for 2 wk  f/u uncontrolled DM 2. Instructions after last f/u visit: Stop lisinopril (low nl bp at last visit). Check bp and HR once daily and write numbers down for review with me at next office visit. Decrease lantus to 9 units every night. Continue current sliding scaled insulin (novolog) at mealtimes (was doing 3U qAM, 5U qlunch, and 5-10 U qsupper at the time of last f/u. Call [facility] at [phone] and inquire about setting up appt with Dr. Rocky Crafts f/u.  Has neuro appt 11/23/17 with Dr. Erlinda Hong at [facility] regarding f/u encephalopathy and punctate CVA while in hosp 4/1-4/5, 2019. He is due to d/x plavix and remain on ASA alone tomorrow.  He is doing [facility] BETTER! DM: seems to have lower glucs late evening and morning (highest morning glucose he has had is 135, otherwise 90-110.  Taking 9 basal insulin, getting 3-5-3 mealtime insulin.  Eating more/better, drinking fine---no signs of aspiration, no dysphagia.[name] He doesn't really consistently feel any periods of disorientation/confusion.  HTN: home bp's good/normal.  Mental status/speech:    Past Medical History:  Diagnosis Date  . Abnormal EKG 2014   This led to stress test which was abnl, which led to cardiology referral: cath showed small vessel dz of 2nd diag branch with 70-80% stenosis--preserved LV function  . Bilateral renal cysts    Non-complex (CT 03/2016): no enhancement, coarse calcifications, mass effect does give false appearance of hydro on non-contract series (followed by urol).  . Bladder cancer ([place]) 03/2016   Invasive high grade urothelial carcinoma.  No mets at dx.  Cystoscopy + bx and partial resection of tumor in hosp when admitted for gross hematuria and AUR.  Neoadj chemo 06/2016, then got cystoprostatectomy.  (Urostomy--ileal conduit urinary diversion with refluxing anastamoses--has RLQ urostomy)-gets routine surveillance by urol (Dr. Tresa Moore), most recent 04/2017--no sign of recurrence--obs status  . CAD (coronary artery disease)    small vessel dz x 1 vessel on cath 2014: med mgmt  . Cerebellar cerebrovascular accident (CVA) without late effect 10/2017   Left, punctate: DAPT therapy x 3 wks per Dr. Leonie Man, neuro, then ASA alone.  . Chronic calcific pancreatitis ([place]) 03/2016   CT: also small cyst in head of pancreas that needs 6 mo f/u imaging (09/2016)  . Diabetes mellitus without complication ([place])    Insulin dependent; DKA admission 10/2017.  [name] Heart murmur, systolic    Noted in prior PCP's records.  I recommended echocardiogram 07/2016 but pt declined.  [name] History of Bell's palsy   . History of chemotherapy   . Hyperlipidemia   . Hypertension   . Pancreas cyst 03/2016   Repeat imaging with MRI pancreas protocol 09/2016  . Umbilical hernia    Repaired when he got his bladder surgery.    Past Surgical History:  Procedure Laterality Date  . CARDIAC CATHETERIZATION  02/09/2013   small vessel dz of 2nd diag branch with 70-80% stenosis--preserved LV function. Medical therapy rec'd. Agustin Cree, PA)  . CARDIOVASCULAR STRESS TEST  01/26/2013   No ischemia.  Wall motion abnormalities at inferior base suggesting small area of infarction.  EF 64%.  . CAROTID ARTERY DOPPLERS  10/15/2017   1-39% stenosis R ICA, no stenosis L ICA.  [name] CATARACT EXTRACTION    . cystoprostatectomy w/LN  surgery  10/17/2016   Ileal conduit: this is an incontinent urine diversion that directs urine from the ureters through a segment of isolated bowel to the surface of the abdominal wall via a cutaneous stoma, and urine drains continuously into an external ostomy appliance.  . CYSTOSCOPY WITH INJECTION N/A 10/17/2016   Procedure: CYSTOSCOPY WITH INJECTION OF INDOCYANINE GREEN DYE;  Surgeon: Alexis Frock, MD;  Location: [facility];   Service: Urology;  Laterality: N/A;  . EYE SURGERY     cataract surgery bilat   . IR GENERIC HISTORICAL  05/12/2016   IR US GUIDE VASC ACCESS RIGHT 05/12/2016 Greggory Keen, MD [facility]  . IR GENERIC HISTORICAL  05/12/2016   IR FLUORO GUIDE PORT INSERTION RIGHT 05/12/2016 Greggory Keen, MD [facility]  . IR REMOVAL TUN ACCESS W/ PORT W/O FL MOD SED  07/03/2017  . OSTOMY     urostomy  . TONSILLECTOMY AND ADENOIDECTOMY  Remote  . TRANSTHORACIC ECHOCARDIOGRAM  10/16/2017   EF 65-70%, normal wall motion, grd I DD.  [name] TRANSURETHRAL RESECTION OF PROSTATE N/A 04/02/2016   Procedure: CYSTOSCOPY, TRANSURETHRAL RESECTION OF BLADDER TUMOR;  Surgeon: [name], MD;  Location: [facility];  Service: Urology;  Laterality: N/A;  . UMBILICAL HERNIA REPAIR  10/2016  . VASECTOMY      Outpatient Medications Prior to Visit  Medication Sig Dispense Refill  . aspirin 325 MG tablet Take 1 tablet (325 mg total) by mouth daily. 30 tablet 0  . clopidogrel (PLAVIX) 75 MG tablet Take 1 tablet (75 mg total) by mouth daily. 30 tablet 0  . insulin aspart (NOVOLOG FLEXPEN) 100 UNIT/ML FlexPen CBG < 70: implement hypoglycemia protocol CBG 70 - 120: 3 units CBG 121 - 150: 5 units CBG 151 - 200: 6 units CBG 201 - 250: 8 units CBG 251 - 300: 11 units CBG 301 - 350: 14 units CBG 351 - 400: 18 units CBG > 400: call MD 15 mL 0  . insulin glargine (LANTUS) 100 unit/mL SOPN 10 U SQ daily (Patient taking differently: Inject 9 Units into the skin daily. ) 15 mL 11  . Insulin Pen Needle (PEN NEEDLES) 32G X 4 MM MISC 1 each by Does not apply route daily. 100 each 11  . Insulin Pen Needle 31G X 5 MM MISC Inject insulin via insulin pen 3 x daily with meals 100 each 0  . metFORMIN (GLUCOPHAGE) 1000 MG tablet Take 1,000 mg by mouth 2 (two) times daily with a meal.    . polyethylene glycol (MIRALAX / GLYCOLAX) packet Take 17 g by mouth daily.    . pravastatin (PRAVACHOL) 20 MG tablet TAKE 1 TABLET BY MOUTH EVERY DAY 90  tablet 1  . blood glucose meter kit and supplies Dispense based on patient and insurance preference. Use up to four times daily as directed. (FOR ICD-10 E10.9, E11.9). (Patient not taking: Reported on 11/04/2017) 1 each 0  . lisinopril (PRINIVIL,ZESTRIL) 5 MG tablet Take 1 tablet (5 mg total) by mouth daily. (Patient not taking: Reported on 11/04/2017) 30 tablet 0  . omega-3 acid ethyl esters (LOVAZA) 1 g capsule TAKE 1 CAPSULE (1 G TOTAL) BY MOUTH DAILY. (Patient not taking: Reported on 11/04/2017) 90 capsule 1  . pravastatin (PRAVACHOL) 20 MG tablet Take 1 tablet (20 mg total) by mouth daily. (Patient not taking: Reported on 11/04/2017) 30 tablet 0   No facility-administered medications prior to visit.     Allergies  Allergen Reactions  . Other Swelling  Pt states had swelling of the eye when having eye surgery and the anesthetic placed in eye     ROS As per HPI  PE: Blood pressure (!) 126/50, pulse 88, temperature 98.1 F (36.7 C), temperature source Oral, resp. rate 16, height '5\' 7"'$  (1.702 m), weight 199 lb (90.3 kg), SpO2 100 %. Gen: Alert, well appearing.  Patient is oriented to person, place, time, and situation. AFFECT: pleasant, lucid thought and speech. CV: RRR, soft systolic murmur, no diastolic murmur.  No rub/gallop. EXT: no clubbing or cyanosis.  He has 3+ bilat LL pitting edema.  LABS:  Lab Results  Component Value Date   TSH 1.084 10/14/2017   Lab Results  Component Value Date   WBC 8.8 10/14/2017   HGB 13.6 10/14/2017   HCT 39.4 10/14/2017   MCV 85.2 10/14/2017   PLT 281 10/14/2017   Lab Results  Component Value Date   CREATININE 0.97 10/21/2017   BUN 42 (H) 10/21/2017   NA 131 (L) 10/21/2017   K 4.9 10/21/2017   CL 103 10/21/2017   CO2 17 (L) 10/21/2017   Lab Results  Component Value Date   ALT 10 10/03/2017   AST 9 (A) 10/03/2017   ALKPHOS 95 10/03/2017   BILITOT 0.40 06/18/2017   Lab Results  Component Value Date   CHOL 94 10/16/2017   Lab  Results  Component Value Date   HDL 26 (L) 10/16/2017   Lab Results  Component Value Date   LDLCALC 50 10/16/2017   Lab Results  Component Value Date   TRIG 88 10/16/2017   Lab Results  Component Value Date   CHOLHDL 3.6 10/16/2017   Lab Results  Component Value Date   HGBA1C 13.4 (H) 10/13/2017    IMPRESSION AND PLAN:  1) Hx of recent punctate acute CVA, w/out residual deficit.  Continue '325mg'$  ASA qd. Stop clopidogrel (plavix).  Keep neuro f/u. Continue to try to control RF's.  2) DM 2, his glucose control is improving significantly.  Stop metformin due to improved gluc control with basal-bolus insulin, plus pt with hx of DKA and has ongoing risk of this and risk of dehydration that would make metformin use too risky. Due for next A1c after 01/12/18.  Continue current glucose monitoring and insulin dosing as noted in HPI.  3) HTN: bp good OFF of lisinopril.  Continue to monitor bp at home. Stay off of lisinopril at this time.   BMET today.  4) Encephalopathy, with aphasia last visit: this lingered after d/c from hospital recently but has since cleared up completely. Signs/symptoms to call or return for were reviewed and pt expressed understanding.  An After Visit Summary was printed and given to the patient.  FOLLOW UP: 10 wks RCI  Signed:  Crissie Sickles, MD           11/04/2017

## 2017-11-04 NOTE — Patient Instructions (Signed)
Stop clopidogrel (plavix). Stop metformin. Stay off of lisinopril.  Continue to take one 325mg  aspirin once a day.

## 2017-11-05 DIAGNOSIS — I2510 Atherosclerotic heart disease of native coronary artery without angina pectoris: Secondary | ICD-10-CM | POA: Diagnosis not present

## 2017-11-05 DIAGNOSIS — G9341 Metabolic encephalopathy: Secondary | ICD-10-CM | POA: Diagnosis not present

## 2017-11-05 DIAGNOSIS — E1165 Type 2 diabetes mellitus with hyperglycemia: Secondary | ICD-10-CM | POA: Diagnosis not present

## 2017-11-05 DIAGNOSIS — L89152 Pressure ulcer of sacral region, stage 2: Secondary | ICD-10-CM | POA: Diagnosis not present

## 2017-11-05 DIAGNOSIS — Z8673 Personal history of transient ischemic attack (TIA), and cerebral infarction without residual deficits: Secondary | ICD-10-CM | POA: Diagnosis not present

## 2017-11-05 DIAGNOSIS — R2689 Other abnormalities of gait and mobility: Secondary | ICD-10-CM | POA: Diagnosis not present

## 2017-11-06 ENCOUNTER — Other Ambulatory Visit: Payer: Medicare Other

## 2017-11-06 DIAGNOSIS — I2510 Atherosclerotic heart disease of native coronary artery without angina pectoris: Secondary | ICD-10-CM | POA: Diagnosis not present

## 2017-11-06 DIAGNOSIS — Z8673 Personal history of transient ischemic attack (TIA), and cerebral infarction without residual deficits: Secondary | ICD-10-CM | POA: Diagnosis not present

## 2017-11-06 DIAGNOSIS — L89152 Pressure ulcer of sacral region, stage 2: Secondary | ICD-10-CM | POA: Diagnosis not present

## 2017-11-06 DIAGNOSIS — I10 Essential (primary) hypertension: Secondary | ICD-10-CM | POA: Diagnosis not present

## 2017-11-06 DIAGNOSIS — K861 Other chronic pancreatitis: Secondary | ICD-10-CM | POA: Diagnosis not present

## 2017-11-06 DIAGNOSIS — Z9181 History of falling: Secondary | ICD-10-CM

## 2017-11-06 DIAGNOSIS — Z936 Other artificial openings of urinary tract status: Secondary | ICD-10-CM

## 2017-11-06 DIAGNOSIS — Z87440 Personal history of urinary (tract) infections: Secondary | ICD-10-CM | POA: Diagnosis not present

## 2017-11-06 DIAGNOSIS — E1165 Type 2 diabetes mellitus with hyperglycemia: Secondary | ICD-10-CM | POA: Diagnosis not present

## 2017-11-06 DIAGNOSIS — Z794 Long term (current) use of insulin: Secondary | ICD-10-CM

## 2017-11-06 DIAGNOSIS — E785 Hyperlipidemia, unspecified: Secondary | ICD-10-CM

## 2017-11-06 DIAGNOSIS — E440 Moderate protein-calorie malnutrition: Secondary | ICD-10-CM | POA: Diagnosis not present

## 2017-11-06 DIAGNOSIS — Z9221 Personal history of antineoplastic chemotherapy: Secondary | ICD-10-CM

## 2017-11-06 DIAGNOSIS — R2689 Other abnormalities of gait and mobility: Secondary | ICD-10-CM | POA: Diagnosis not present

## 2017-11-06 DIAGNOSIS — G9341 Metabolic encephalopathy: Secondary | ICD-10-CM | POA: Diagnosis not present

## 2017-11-06 DIAGNOSIS — Z87891 Personal history of nicotine dependence: Secondary | ICD-10-CM

## 2017-11-09 DIAGNOSIS — E1165 Type 2 diabetes mellitus with hyperglycemia: Secondary | ICD-10-CM | POA: Diagnosis not present

## 2017-11-09 DIAGNOSIS — Z8673 Personal history of transient ischemic attack (TIA), and cerebral infarction without residual deficits: Secondary | ICD-10-CM | POA: Diagnosis not present

## 2017-11-09 DIAGNOSIS — I2510 Atherosclerotic heart disease of native coronary artery without angina pectoris: Secondary | ICD-10-CM | POA: Diagnosis not present

## 2017-11-09 DIAGNOSIS — L89152 Pressure ulcer of sacral region, stage 2: Secondary | ICD-10-CM | POA: Diagnosis not present

## 2017-11-09 DIAGNOSIS — G9341 Metabolic encephalopathy: Secondary | ICD-10-CM | POA: Diagnosis not present

## 2017-11-09 DIAGNOSIS — R2689 Other abnormalities of gait and mobility: Secondary | ICD-10-CM | POA: Diagnosis not present

## 2017-11-10 DIAGNOSIS — R2689 Other abnormalities of gait and mobility: Secondary | ICD-10-CM | POA: Diagnosis not present

## 2017-11-10 DIAGNOSIS — I2510 Atherosclerotic heart disease of native coronary artery without angina pectoris: Secondary | ICD-10-CM | POA: Diagnosis not present

## 2017-11-10 DIAGNOSIS — Z8673 Personal history of transient ischemic attack (TIA), and cerebral infarction without residual deficits: Secondary | ICD-10-CM | POA: Diagnosis not present

## 2017-11-10 DIAGNOSIS — E1165 Type 2 diabetes mellitus with hyperglycemia: Secondary | ICD-10-CM | POA: Diagnosis not present

## 2017-11-10 DIAGNOSIS — G9341 Metabolic encephalopathy: Secondary | ICD-10-CM | POA: Diagnosis not present

## 2017-11-10 DIAGNOSIS — L89152 Pressure ulcer of sacral region, stage 2: Secondary | ICD-10-CM | POA: Diagnosis not present

## 2017-11-13 ENCOUNTER — Other Ambulatory Visit: Payer: Self-pay

## 2017-11-13 ENCOUNTER — Other Ambulatory Visit: Payer: Self-pay | Admitting: Family Medicine

## 2017-11-13 MED ORDER — INSULIN PEN NEEDLE 31G X 5 MM MISC: 3 refills | Status: DC

## 2017-11-13 NOTE — Telephone Encounter (Signed)
Copied from [place] [id number]. Topic: Quick Communication - Rx Refill/Question >> Nov 13, 2017  2:41 PM Arletha Grippe wrote: Medication: Insulin Pen Needle 31G X 5 MM MISC and Continuous Blood Gluc Sensor (FREESTYLE LIBRE 14 DAY SENSOR) MISC  Has the patient contacted their pharmacy? Yes.   (Agent: If no, request that the patient contact the pharmacy for the refill.) Preferred Pharmacy (with phone number [facility] [place]: Please be advised that RX refills may take up to 3 business days. We ask that you follow-up with your pharmacy.

## 2017-11-16 DIAGNOSIS — I2510 Atherosclerotic heart disease of native coronary artery without angina pectoris: Secondary | ICD-10-CM | POA: Diagnosis not present

## 2017-11-16 DIAGNOSIS — L89152 Pressure ulcer of sacral region, stage 2: Secondary | ICD-10-CM | POA: Diagnosis not present

## 2017-11-16 DIAGNOSIS — R2689 Other abnormalities of gait and mobility: Secondary | ICD-10-CM | POA: Diagnosis not present

## 2017-11-16 DIAGNOSIS — E1165 Type 2 diabetes mellitus with hyperglycemia: Secondary | ICD-10-CM | POA: Diagnosis not present

## 2017-11-16 DIAGNOSIS — G9341 Metabolic encephalopathy: Secondary | ICD-10-CM | POA: Diagnosis not present

## 2017-11-16 DIAGNOSIS — Z8673 Personal history of transient ischemic attack (TIA), and cerebral infarction without residual deficits: Secondary | ICD-10-CM | POA: Diagnosis not present

## 2017-11-16 NOTE — Telephone Encounter (Signed)
Freestyle Libre 14 Day Sensor refill request and  Insulin Pen Needle 31G X 65mm refill request.  LOV 11/04/17 with Dr. Anitra Lauth.  [facility] [address] [place] [address] [facility] [place] [address].

## 2017-11-17 MED ORDER — FREESTYLE LIBRE 14 DAY SENSOR MISC: 1.0000 | Freq: Every day | 5 refills | Status: DC

## 2017-11-17 MED ORDER — INSULIN PEN NEEDLE 31G X 5 MM MISC: 3 refills | Status: DC

## 2017-11-18 ENCOUNTER — Telehealth: Payer: Self-pay | Admitting: *Deleted

## 2017-11-18 NOTE — Telephone Encounter (Signed)
I do not need to see him prior to his d/c from [facility]. Keep July appt as scheduled.-thx

## 2017-11-18 NOTE — Telephone Encounter (Signed)
Copied from [place] [id number]. Topic: Appointment Scheduling - Scheduling Inquiry for Clinic >> Nov 18, 2017 10:10 AM Scherrie Gerlach wrote: Reason for CRM: wife states pt is being discharged from [facility] next week.  Wife wants to know if the dr needs to see him prior to that? Does pt need to come in for appt or keep his July appt as scheduled? Wife would like a call back

## 2017-11-18 NOTE — Telephone Encounter (Signed)
Please advise. Thanks.  

## 2017-11-18 NOTE — Telephone Encounter (Signed)
Left detailed message on wife's cell vm, okay per DPR.

## 2017-11-23 ENCOUNTER — Ambulatory Visit (INDEPENDENT_AMBULATORY_CARE_PROVIDER_SITE_OTHER): Payer: Medicare Other | Admitting: Neurology

## 2017-11-23 ENCOUNTER — Encounter: Payer: Self-pay | Admitting: Neurology

## 2017-11-23 VITALS — BP 124/75 | HR 73 | Ht 68.0 in | Wt 206.8 lb

## 2017-11-23 DIAGNOSIS — E1110 Type 2 diabetes mellitus with ketoacidosis without coma: Secondary | ICD-10-CM | POA: Diagnosis not present

## 2017-11-23 DIAGNOSIS — E119 Type 2 diabetes mellitus without complications: Secondary | ICD-10-CM

## 2017-11-23 DIAGNOSIS — I6330 Cerebral infarction due to thrombosis of unspecified cerebral artery: Secondary | ICD-10-CM | POA: Diagnosis not present

## 2017-11-23 DIAGNOSIS — I10 Essential (primary) hypertension: Secondary | ICD-10-CM | POA: Diagnosis not present

## 2017-11-23 DIAGNOSIS — C679 Malignant neoplasm of bladder, unspecified: Secondary | ICD-10-CM

## 2017-11-23 MED ORDER — ASPIRIN EC 81 MG PO TBEC: 81.0000 mg | DELAYED_RELEASE_TABLET | Freq: Every day | ORAL | Status: DC

## 2017-11-23 NOTE — Patient Instructions (Addendum)
-   continue ASA 81mg  and pravastatin daily for stroke prevention  - check BP and glucose at home and record - Follow up with your primary care physician for stroke risk factor modification. Recommend maintain blood pressure goal <130/80, diabetes with hemoglobin A1c goal below 7.0% and lipids with LDL cholesterol goal below 70 mg/dL.  - diabetic diet and regular exercise - follow up as needed.

## 2017-11-23 NOTE — Progress Notes (Signed)
STROKE NEUROLOGY FOLLOW UP NOTE  NAME: Klark Vanderhoef DOB: 03/14/1936  REASON FOR VISIT: stroke follow up HISTORY FROM: pt and chart  Today we had the pleasure of seeing Kashawn Dirr in follow-up at our Neurology Clinic. Pt was accompanied by wife.   History Summary Mr. Braian Tijerina is a 82 y.o. male with history of CAD, DM (noncompliant with medications), and bladder cancer admitted on 10/12/17 for generalized weakness and falls, confused and encephalopathic. Found to be in DKA, have an UTI, hypotension, AKI and metabolic encephalopathy. MRI done as part of his encephalopathy workup revealed an acute punctate right cerebellar infarct. MRA and CUS unremarkable, EF 65-70%. LDL 50 and A1C 13.4. His condition was related to hyperglycemia and dehydration and UTI, and the cerebellar punctate infarct was likely incidental finding and small vessel source. He was put on ASA and plavix for 3 weeks. And continued pravastatin.  Interval History During the interval time, the patient has been doing well.  No strokelike symptoms.  Glucose control much better, and this morning 112.  Is on insulin and compliant with medication.  Had PCP follow-up last week.  Today BP 124/75, currently at end of home health therapy.  On aspirin and pravastatin.  REVIEW OF SYSTEMS: Full 14 system review of systems performed and notable only for those listed below and in HPI above, all others are negative:  Constitutional:   Cardiovascular:  Ear/Nose/Throat:   Skin:  Eyes:   Respiratory:   Gastroitestinal:   Genitourinary:  Hematology/Lymphatic:   Endocrine:  Musculoskeletal:   Allergy/Immunology:   Neurological:   Psychiatric:  Sleep:   The following represents the patient's updated allergies and side effects list: Allergies  Allergen Reactions  . Other Swelling    Pt states had swelling of the eye when having eye surgery and the anesthetic placed in eye     The neurologically relevant items  on the patient's problem list were reviewed on today's visit.  Neurologic Examination  A problem focused neurological exam (12 or more points of the single system neurologic examination, vital signs counts as 1 point, cranial nerves count for 8 points) was performed.  Blood pressure 124/75, pulse 73, height 5\' 8"  (1.727 m), weight 206 lb 12.8 oz (93.8 kg).  General - Well nourished, well developed, in no apparent distress.  Ophthalmologic - Fundi not visualized due to noncooperation.  Cardiovascular - Regular rate and rhythm.  Mental Status -  Level of arousal and orientation to year, place, and person were intact, but not orientated to months. Language including expression, naming, repetition, comprehension was assessed and found intact.  Cranial Nerves II - XII - II - Visual field intact OU. III, IV, VI - Extraocular movements intact. V - Facial sensation intact bilaterally. VII - Facial movement intact bilaterally. VIII - Hearing & vestibular intact bilaterally. X - Palate elevates symmetrically. XI - Chin turning & shoulder shrug intact bilaterally. XII - Tongue protrusion intact.  Motor Strength - The patient's strength was normal in all extremities and pronator drift was absent.  Bulk was normal and fasciculations were absent.   Motor Tone - Muscle tone was assessed at the neck and appendages and was normal.  Reflexes - The patient's reflexes were 1+ in all extremities and he had no pathological reflexes.  Sensory - Light touch, temperature/pinprick were assessed and were normal.    Coordination - The patient had normal movements in the hands with no ataxia or dysmetria.  Tremor was absent.  Gait and Station - slow and small stride, mild stooped posturing, steady   Functional score  mRS = 1   0 - No symptoms.   1 - No significant disability. Able to carry out all usual activities, despite some symptoms.   2 - Slight disability. Able to look after own affairs  without assistance, but unable to carry out all previous activities.   3 - Moderate disability. Requires some help, but able to walk unassisted.   4 - Moderately severe disability. Unable to attend to own bodily needs without assistance, and unable to walk unassisted.   5 - Severe disability. Requires constant nursing care and attention, bedridden, incontinent.   6 - Dead.   NIH Stroke Scale = 0   Data reviewed: I personally reviewed the images and agree with the radiology interpretations.  Mr Brain Wo Contrast  Result Date: 10/14/2017 CLINICAL DATA:  Generalized weakness with multiple falls. Somnolence. History of urothelial carcinoma. EXAM: MRI HEAD WITHOUT CONTRAST TECHNIQUE: Multiplanar, multiecho pulse sequences of the brain and surrounding structures were obtained without intravenous contrast. COMPARISON:  Head CT 10/12/2017 FINDINGS: Brain: There is a punctate focus of restricted diffusion within the inferior cerebellum on the right consistent with a small vessel infarction. No other acute infarction. Elsewhere, brainstem and cerebellum appear normal. Cerebral hemispheres show mild to moderate chronic small-vessel ischemic changes of the deep and subcortical white matter. No cortical or large vessel territory infarction. No mass lesion, hemorrhage, hydrocephalus or extra-axial collection. Contrast was not utilized. Vascular: Major vessels at the base of the brain show flow. However, there may be abnormal flow in the distal right vertebral artery. Skull and upper cervical spine: Negative Sinuses/Orbits: Clear/normal Other: None IMPRESSION: Punctate acute infarction within the inferior cerebellum on the right. Possible abnormal flow in the distal right vertebral artery. Mild age related atrophy and cerebral hemispheric chronic white matter ischemic changes. No sign of metastatic disease. Electronically Signed   By: Nelson Chimes M.D.   On: 10/14/2017 16:39   Mr Jodene Nam Head Wo  Contrast  Result Date: 10/14/2017 CLINICAL DATA:  82 y/o  M; stroke for follow-up. EXAM: MRA HEAD WITHOUT CONTRAST TECHNIQUE: Angiographic images of the Circle of Willis were obtained using MRA technique without intravenous contrast. COMPARISON:  10/14/2017 MRI of the head. FINDINGS: Internal carotid arteries:  Patent. Anterior cerebral arteries:  Patent. Middle cerebral arteries: Patent. Anatomic variant: Anterior and posterior communicating arteries not identified, likely hypoplastic or absent. Posterior cerebral arteries:  Patent. Basilar artery:  Patent. Vertebral arteries:  Patent. No evidence of high-grade stenosis, large vessel occlusion, or aneurysm identified. Mild motion artifact. IMPRESSION: Mild motion artifact. Patent anterior and posterior intracranial circulation. No large vessel occlusion, aneurysm, or significant stenosis. Electronically Signed   By: Kristine Garbe M.D.   On: 10/14/2017 19:18   CUS Right Carotid: Velocities in the right ICA are consistent with a 1-39% stenosis. Left Carotid: There is no evidence of stenosis in the left ICA. Vertebrals: Bilateral vertebral arteries demonstrate antegrade flow. Subclavians: Normal flow hemodynamics were seen in bilateral subclavian       arteries.  TTE - Left ventricle: The cavity size was normal. There was mild focal   basal hypertrophy of the septum. Systolic function was vigorous.   The estimated ejection fraction was in the range of 65% to 70%.   Wall motion was normal; there were no regional wall motion   abnormalities. There was an increased relative contribution of   atrial contraction to ventricular filling. Doppler  parameters are   consistent with abnormal left ventricular relaxation (grade 1   diastolic dysfunction). - Aortic valve: Valve mobility was mildly restricted. Valve area   (VTI): 2 cm^2. Valve area (Vmax): 1.74 cm^2. Valve area (Vmean):   1.74 cm^2. - Mitral valve: Calcified annulus. -  Atrial septum: There was increased thickness of the septum,   consistent with lipomatous hypertrophy. - Pulmonic valve: There was trivial regurgitation.  Component     Latest Ref Rng & Units 10/13/2017 10/14/2017 10/16/2017  Cholesterol     0 - 200 mg/dL   94  Triglycerides     <150 mg/dL   88  HDL Cholesterol     >40 mg/dL   26 (L)  Total CHOL/HDL Ratio     RATIO   3.6  VLDL     0 - 40 mg/dL   18  LDL (calc)     0 - 99 mg/dL   50  Hemoglobin A1C     4.8 - 5.6 % 13.4 (H)    Mean Plasma Glucose     mg/dL 337.88    TSH     0.350 - 4.500 uIU/mL  1.084   Vitamin B12     180 - 914 pg/mL  946 (H)   RPR     Non Reactive  Non Reactive   HIV Screen 4th Generation wRfx     Non Reactive  Non Reactive     Assessment: As you may recall, he is a 82 y.o. Caucasian male with PMH of CAD, DM (noncompliant with medications), and bladder cancer admitted on 10/12/17 for DKA, UTI, hypotension, AKI and metabolic encephalopathy. MRI done as part of his encephalopathy workup revealed an acute punctate right cerebellar infarct. MRA and CUS unremarkable, EF 65-70%. LDL 50 and A1C 13.4. His condition was related to hyperglycemia and dehydration and UTI, and the cerebellar punctate infarct was likely incidental finding and small vessel source. He was put on ASA and plavix for 3 weeks. And continued pravastatin. Doing well now, with better HTN and DM control. On aspirin and pravastatin now.  Plan:  - continue ASA 81mg  and pravastatin for stroke prevention  - check BP and glucose at home and record - Follow up with your primary care physician for stroke risk factor modification. Recommend maintain blood pressure goal <130/80, diabetes with hemoglobin A1c goal below 7.0% and lipids with LDL cholesterol goal below 70 mg/dL.  - diabetic diet and regular exercise - compliance with medication - follow up as needed.   No orders of the defined types were placed in this encounter.   Meds ordered this encounter   Medications  . aspirin EC 81 MG tablet    Sig: Take 1 tablet (81 mg total) by mouth daily.    Patient Instructions  - continue ASA 81mg  and pravastatin daily for stroke prevention  - check BP and glucose at home and record - Follow up with your primary care physician for stroke risk factor modification. Recommend maintain blood pressure goal <130/80, diabetes with hemoglobin A1c goal below 7.0% and lipids with LDL cholesterol goal below 70 mg/dL.  - diabetic diet and regular exercise - follow up as needed.    Rosalin Hawking, MD PhD [facility] [address] [place],  [id number] [phone]

## 2017-11-30 DIAGNOSIS — R2689 Other abnormalities of gait and mobility: Secondary | ICD-10-CM | POA: Diagnosis not present

## 2017-11-30 DIAGNOSIS — G9341 Metabolic encephalopathy: Secondary | ICD-10-CM | POA: Diagnosis not present

## 2017-11-30 DIAGNOSIS — L89152 Pressure ulcer of sacral region, stage 2: Secondary | ICD-10-CM | POA: Diagnosis not present

## 2017-11-30 DIAGNOSIS — Z8673 Personal history of transient ischemic attack (TIA), and cerebral infarction without residual deficits: Secondary | ICD-10-CM | POA: Diagnosis not present

## 2017-11-30 DIAGNOSIS — I2510 Atherosclerotic heart disease of native coronary artery without angina pectoris: Secondary | ICD-10-CM | POA: Diagnosis not present

## 2017-11-30 DIAGNOSIS — E1165 Type 2 diabetes mellitus with hyperglycemia: Secondary | ICD-10-CM | POA: Diagnosis not present

## 2017-12-01 DIAGNOSIS — E119 Type 2 diabetes mellitus without complications: Secondary | ICD-10-CM | POA: Diagnosis not present

## 2017-12-01 LAB — HM DIABETES EYE EXAM

## 2017-12-02 ENCOUNTER — Encounter: Payer: Self-pay | Admitting: Family Medicine

## 2017-12-05 DIAGNOSIS — N390 Urinary tract infection, site not specified: Secondary | ICD-10-CM | POA: Diagnosis not present

## 2017-12-12 DIAGNOSIS — K922 Gastrointestinal hemorrhage, unspecified: Secondary | ICD-10-CM

## 2017-12-12 DIAGNOSIS — D509 Iron deficiency anemia, unspecified: Secondary | ICD-10-CM

## 2017-12-12 HISTORY — DX: Iron deficiency anemia, unspecified: D50.9

## 2017-12-12 HISTORY — DX: Gastrointestinal hemorrhage, unspecified: K92.2

## 2017-12-14 DIAGNOSIS — R2689 Other abnormalities of gait and mobility: Secondary | ICD-10-CM | POA: Diagnosis not present

## 2017-12-14 DIAGNOSIS — G9341 Metabolic encephalopathy: Secondary | ICD-10-CM | POA: Diagnosis not present

## 2017-12-14 DIAGNOSIS — Z8673 Personal history of transient ischemic attack (TIA), and cerebral infarction without residual deficits: Secondary | ICD-10-CM | POA: Diagnosis not present

## 2017-12-14 DIAGNOSIS — I2510 Atherosclerotic heart disease of native coronary artery without angina pectoris: Secondary | ICD-10-CM | POA: Diagnosis not present

## 2017-12-14 DIAGNOSIS — E1165 Type 2 diabetes mellitus with hyperglycemia: Secondary | ICD-10-CM | POA: Diagnosis not present

## 2017-12-14 DIAGNOSIS — L89152 Pressure ulcer of sacral region, stage 2: Secondary | ICD-10-CM | POA: Diagnosis not present

## 2017-12-17 ENCOUNTER — Inpatient Hospital Stay: Payer: Medicare Other

## 2017-12-17 ENCOUNTER — Inpatient Hospital Stay (HOSPITAL_COMMUNITY)
Admission: EM | Admit: 2017-12-17 | Discharge: 2017-12-20 | DRG: 378 | Disposition: A | Discharge status: Home / Self Care | Payer: Medicare Other | Attending: Internal Medicine | Admitting: Internal Medicine

## 2017-12-17 ENCOUNTER — Other Ambulatory Visit: Payer: Self-pay

## 2017-12-17 ENCOUNTER — Encounter (HOSPITAL_COMMUNITY): Payer: Self-pay | Admitting: Emergency Medicine

## 2017-12-17 ENCOUNTER — Inpatient Hospital Stay: Payer: Medicare Other | Attending: Oncology | Admitting: Oncology

## 2017-12-17 VITALS — BP 87/36 | HR 80 | Temp 98.4°F | Resp 18 | Ht 68.0 in | Wt 197.4 lb

## 2017-12-17 DIAGNOSIS — K921 Melena: Secondary | ICD-10-CM | POA: Diagnosis not present

## 2017-12-17 DIAGNOSIS — Z79899 Other long term (current) drug therapy: Secondary | ICD-10-CM

## 2017-12-17 DIAGNOSIS — D62 Acute posthemorrhagic anemia: Secondary | ICD-10-CM

## 2017-12-17 DIAGNOSIS — I959 Hypotension, unspecified: Secondary | ICD-10-CM

## 2017-12-17 DIAGNOSIS — E119 Type 2 diabetes mellitus without complications: Secondary | ICD-10-CM

## 2017-12-17 DIAGNOSIS — Z8551 Personal history of malignant neoplasm of bladder: Secondary | ICD-10-CM

## 2017-12-17 DIAGNOSIS — E785 Hyperlipidemia, unspecified: Secondary | ICD-10-CM | POA: Diagnosis not present

## 2017-12-17 DIAGNOSIS — Z7982 Long term (current) use of aspirin: Secondary | ICD-10-CM

## 2017-12-17 DIAGNOSIS — R5383 Other fatigue: Secondary | ICD-10-CM

## 2017-12-17 DIAGNOSIS — C679 Malignant neoplasm of bladder, unspecified: Secondary | ICD-10-CM

## 2017-12-17 DIAGNOSIS — K2951 Unspecified chronic gastritis with bleeding: Secondary | ICD-10-CM | POA: Diagnosis not present

## 2017-12-17 DIAGNOSIS — Z8673 Personal history of transient ischemic attack (TIA), and cerebral infarction without residual deficits: Secondary | ICD-10-CM

## 2017-12-17 DIAGNOSIS — I2510 Atherosclerotic heart disease of native coronary artery without angina pectoris: Secondary | ICD-10-CM | POA: Diagnosis present

## 2017-12-17 DIAGNOSIS — K922 Gastrointestinal hemorrhage, unspecified: Secondary | ICD-10-CM

## 2017-12-17 DIAGNOSIS — K861 Other chronic pancreatitis: Secondary | ICD-10-CM | POA: Diagnosis not present

## 2017-12-17 DIAGNOSIS — Z936 Other artificial openings of urinary tract status: Secondary | ICD-10-CM | POA: Diagnosis not present

## 2017-12-17 DIAGNOSIS — I10 Essential (primary) hypertension: Secondary | ICD-10-CM | POA: Diagnosis not present

## 2017-12-17 DIAGNOSIS — Z884 Allergy status to anesthetic agent status: Secondary | ICD-10-CM | POA: Diagnosis not present

## 2017-12-17 DIAGNOSIS — D649 Anemia, unspecified: Secondary | ICD-10-CM | POA: Insufficient documentation

## 2017-12-17 DIAGNOSIS — Z9221 Personal history of antineoplastic chemotherapy: Secondary | ICD-10-CM

## 2017-12-17 DIAGNOSIS — R0609 Other forms of dyspnea: Secondary | ICD-10-CM

## 2017-12-17 DIAGNOSIS — Z87891 Personal history of nicotine dependence: Secondary | ICD-10-CM | POA: Diagnosis not present

## 2017-12-17 DIAGNOSIS — Z794 Long term (current) use of insulin: Secondary | ICD-10-CM | POA: Diagnosis not present

## 2017-12-17 DIAGNOSIS — R000 Tachycardia, unspecified: Secondary | ICD-10-CM

## 2017-12-17 DIAGNOSIS — R710 Precipitous drop in hematocrit: Secondary | ICD-10-CM

## 2017-12-17 DIAGNOSIS — N390 Urinary tract infection, site not specified: Secondary | ICD-10-CM | POA: Diagnosis present

## 2017-12-17 DIAGNOSIS — N132 Hydronephrosis with renal and ureteral calculous obstruction: Secondary | ICD-10-CM | POA: Diagnosis not present

## 2017-12-17 DIAGNOSIS — Q438 Other specified congenital malformations of intestine: Secondary | ICD-10-CM | POA: Diagnosis not present

## 2017-12-17 DIAGNOSIS — K2970 Gastritis, unspecified, without bleeding: Secondary | ICD-10-CM | POA: Diagnosis not present

## 2017-12-17 DIAGNOSIS — K269 Duodenal ulcer, unspecified as acute or chronic, without hemorrhage or perforation: Secondary | ICD-10-CM | POA: Diagnosis not present

## 2017-12-17 LAB — COMPREHENSIVE METABOLIC PANEL
ALT: 10 U/L (ref 0–55)
ALT: 12 U/L — ABNORMAL LOW (ref 17–63)
AST: 12 U/L (ref 5–34)
AST: 11 U/L — ABNORMAL LOW (ref 15–41)
Albumin: 2.7 g/dL — ABNORMAL LOW (ref 3.5–5.0)
Albumin: 2.6 g/dL — ABNORMAL LOW (ref 3.5–5.0)
Alkaline Phosphatase: 70 U/L (ref 40–150)
Alkaline Phosphatase: 57 U/L (ref 38–126)
Anion gap: 11 (ref 3–11)
Anion gap: 9 (ref 5–15)
BUN: 39 mg/dL — ABNORMAL HIGH (ref 7–26)
BUN: 42 mg/dL — ABNORMAL HIGH (ref 6–20)
CO2: 20 mmol/L — ABNORMAL LOW (ref 22–29)
CO2: 22 mmol/L (ref 22–32)
Calcium: 8.4 mg/dL (ref 8.4–10.4)
Calcium: 8.2 mg/dL — ABNORMAL LOW (ref 8.9–10.3)
Chloride: 108 mmol/L (ref 98–109)
Chloride: 110 mmol/L (ref 101–111)
Creatinine, Ser: 1.28 mg/dL (ref 0.70–1.30)
Creatinine, Ser: 1.26 mg/dL — ABNORMAL HIGH (ref 0.61–1.24)
GFR calc Af Amer: 59 mL/min — ABNORMAL LOW (ref >60)
GFR calc Af Amer: >60 mL/min (ref >60)
GFR calc non Af Amer: 51 mL/min — ABNORMAL LOW (ref >60)
GFR calc non Af Amer: 52 mL/min — ABNORMAL LOW (ref >60)
Glucose, Bld: 192 mg/dL — ABNORMAL HIGH (ref 70–140)
Glucose, Bld: 164 mg/dL — ABNORMAL HIGH (ref 65–99)
Potassium: 4.7 mmol/L (ref 3.5–5.1)
Potassium: 4.5 mmol/L (ref 3.5–5.1)
Sodium: 139 mmol/L (ref 136–145)
Sodium: 141 mmol/L (ref 135–145)
Total Bilirubin: <0.2 mg/dL — ABNORMAL LOW (ref 0.2–1.2)
Total Bilirubin: 0.2 mg/dL — ABNORMAL LOW (ref 0.3–1.2)
Total Protein: 6.2 g/dL — ABNORMAL LOW (ref 6.4–8.3)
Total Protein: 6.2 g/dL — ABNORMAL LOW (ref 6.5–8.1)

## 2017-12-17 LAB — CBC WITH DIFFERENTIAL/PLATELET
Basophils Absolute: 0.1 10*3/uL (ref 0.0–0.1)
Basophils Absolute: 0.1 10*3/uL (ref 0.0–0.1)
Basophils Relative: 2 %
Basophils Relative: 1 %
Eosinophils Absolute: 0 10*3/uL (ref 0.0–0.5)
Eosinophils Absolute: 0 10*3/uL (ref 0.0–0.7)
Eosinophils Relative: 0 %
Eosinophils Relative: 1 %
HCT: 17 % — ABNORMAL LOW (ref 38.4–49.9)
HCT: 15.7 % — ABNORMAL LOW (ref 39.0–52.0)
Hemoglobin: 5.2 g/dL — CL (ref 13.0–17.1)
Hemoglobin: 4.9 g/dL — CL (ref 13.0–17.0)
Lymphocytes Relative: 20 %
Lymphocytes Relative: 24 %
Lymphs Abs: 1.5 10*3/uL (ref 0.9–3.3)
Lymphs Abs: 1.6 10*3/uL (ref 0.7–4.0)
MCH: 25.7 pg — ABNORMAL LOW (ref 27.2–33.4)
MCH: 25.9 pg — ABNORMAL LOW (ref 26.0–34.0)
MCHC: 30.9 g/dL — ABNORMAL LOW (ref 32.0–36.0)
MCHC: 31.2 g/dL (ref 30.0–36.0)
MCV: 82.9 fL (ref 79.3–98.0)
MCV: 83.1 fL (ref 78.0–100.0)
Monocytes Absolute: 0.3 10*3/uL (ref 0.1–0.9)
Monocytes Absolute: 0.2 10*3/uL (ref 0.1–1.0)
Monocytes Relative: 4 %
Monocytes Relative: 2 %
Neutro Abs: 5.5 10*3/uL (ref 1.5–6.5)
Neutro Abs: 4.8 10*3/uL (ref 1.7–7.7)
Neutrophils Relative %: 74 %
Neutrophils Relative %: 72 %
Platelets: 537 10*3/uL — ABNORMAL HIGH (ref 140–400)
Platelets: 545 10*3/uL — ABNORMAL HIGH (ref 150–400)
RBC: 2.05 MIL/uL — ABNORMAL LOW (ref 4.20–5.82)
RBC: 1.89 MIL/uL — ABNORMAL LOW (ref 4.22–5.81)
RDW: 19.4 % — ABNORMAL HIGH (ref 11.0–14.6)
RDW: 18.6 % — ABNORMAL HIGH (ref 11.5–15.5)
WBC: 7.4 10*3/uL (ref 4.0–10.3)
WBC: 6.6 10*3/uL (ref 4.0–10.5)

## 2017-12-17 LAB — URINALYSIS, ROUTINE W REFLEX MICROSCOPIC
Bilirubin Urine: NEGATIVE
Glucose, UA: NEGATIVE mg/dL
Ketones, ur: NEGATIVE mg/dL
Nitrite: POSITIVE — AB
Protein, ur: NEGATIVE mg/dL
Specific Gravity, Urine: 1.011 (ref 1.005–1.030)
WBC, UA: >50 WBC/hpf — ABNORMAL HIGH (ref 0–5)
pH: 6 (ref 5.0–8.0)

## 2017-12-17 LAB — GLUCOSE, CAPILLARY
Glucose-Capillary: 117 mg/dL — ABNORMAL HIGH (ref 65–99)
Glucose-Capillary: 156 mg/dL — ABNORMAL HIGH (ref 65–99)

## 2017-12-17 LAB — PREPARE RBC (CROSSMATCH)

## 2017-12-17 LAB — POC OCCULT BLOOD, ED: Fecal Occult Bld: POSITIVE — AB

## 2017-12-17 LAB — PROTIME-INR
INR: 1.07
Prothrombin Time: 13.8 seconds (ref 11.4–15.2)

## 2017-12-17 MED ORDER — INSULIN ASPART 100 UNIT/ML ~~LOC~~ SOLN: 0.0000 [IU] | Freq: Every day | SUBCUTANEOUS | Status: DC

## 2017-12-17 MED ORDER — INSULIN ASPART 100 UNIT/ML ~~LOC~~ SOLN
0.0000 [IU] | Freq: Three times a day (TID) | SUBCUTANEOUS | Status: DC
  Administered 2017-12-18 (×2): 1 [IU] via SUBCUTANEOUS
  Administered 2017-12-19 (×2): 3 [IU] via SUBCUTANEOUS

## 2017-12-17 MED ORDER — SODIUM CHLORIDE 0.9 % IV SOLN
8.0000 mg/h | INTRAVENOUS | Status: DC
  Administered 2017-12-17 – 2017-12-18 (×3): 8 mg/h via INTRAVENOUS
  Filled 2017-12-17 (×4): qty 80

## 2017-12-17 MED ORDER — SODIUM CHLORIDE 0.9 % IV SOLN
INTRAVENOUS | Status: DC
  Administered 2017-12-18: 06:00:00 via INTRAVENOUS

## 2017-12-17 MED ORDER — SODIUM CHLORIDE 0.9 % IV SOLN
80.0000 mg | Freq: Once | INTRAVENOUS | Status: AC
  Administered 2017-12-17: 13:00:00 80 mg via INTRAVENOUS
  Filled 2017-12-17: qty 80

## 2017-12-17 MED ORDER — SODIUM CHLORIDE 0.9 % IV SOLN: 10.0000 mL/h | Freq: Once | INTRAVENOUS | Status: DC

## 2017-12-17 MED ORDER — SODIUM CHLORIDE 0.9 % IV SOLN
1.0000 g | INTRAVENOUS | Status: DC
  Administered 2017-12-17: 1 g via INTRAVENOUS
  Filled 2017-12-17: qty 1
  Filled 2017-12-17: qty 10

## 2017-12-17 MED ORDER — PRAVASTATIN SODIUM 20 MG PO TABS
20.0000 mg | ORAL_TABLET | Freq: Every day | ORAL | Status: DC
  Administered 2017-12-18 – 2017-12-19 (×2): 20 mg via ORAL
  Filled 2017-12-17 (×2): qty 1

## 2017-12-17 NOTE — H&P (Addendum)
History and Physical    Osvaldo Lamping VWU:[id number] DOB: 1936-02-21 DOA: 12/17/2017  PCP: Tammi Sou, MD   Patient coming from: Home    Chief Complaint: Weakness, dark stool  HPI: Jayke Caul is a 82 y.o. male with medical history significant of urothelial carcinoma of the bladder diagnosed in September 2017, status post TURBT on 04/02/2016 , diabetes mellitus, hyperlipidemia, stroke who presented to the emergency department with complaints of generalized weakness and dark stools since last few days. Patient lives with his family.  Family reports that he was recently diagnosed with UTI and he completed his antibiotics as an outpatient. He was in his oncologist's office today where he was found to have severe anemia.  Patient was then referred to the emergency department.    Patient has been noticing black tarry stools since last few days.  This morning he had a large dark bowel movement without evidence of fresh blood.  Patient takes baby aspirin once a day at home for history of stroke. Hemoglobin was 4.9 on presentation.  Patient found to be pale but hemodynamically stable.  Started on blood transfusion. Patient seen and examined the bedside in the emergency department.  He was found to be comfortable.  He denies any nausea, vomiting, abdominal pain, chest pain, palpitations, dysuria, fever, chills, headache.  Denies intake of NSAIDs or any other blood thinners besides aspirin.   ED Course: Patient was ordered for transfusion with 3 units of PRBC.  GI consulted.  Review of Systems: As per HPI otherwise 10 point review of systems negative.    Past Medical History:  Diagnosis Date  . Abnormal EKG 2014   This led to stress test which was abnl, which led to cardiology referral: cath showed small vessel dz of 2nd diag branch with 70-80% stenosis--preserved LV function  . Bilateral renal cysts    Non-complex (CT 03/2016): no enhancement, coarse calcifications, mass effect  does give false appearance of hydro on non-contract series (followed by urol).  . Bladder cancer ([place]) 03/2016   Invasive high grade urothelial carcinoma.  No mets at dx.  Cystoscopy + bx and partial resection of tumor in hosp when admitted for gross hematuria and AUR.  Neoadj chemo 06/2016, then got cystoprostatectomy. (Urostomy--ileal conduit urinary diversion with refluxing anastamoses--has RLQ urostomy)-gets routine surveillance by urol (Dr. Tresa Moore), most recent 04/2017--no sign of recurrence--obs status  . CAD (coronary artery disease)    small vessel dz x 1 vessel on cath 2014: med mgmt  . Cerebellar cerebrovascular accident (CVA) without late effect 10/2017   Left, punctate: DAPT therapy x 3 wks per Dr. Leonie Man, neuro, then ASA alone.  . Chronic calcific pancreatitis ([place]) 03/2016   CT: also small cyst in head of pancreas that needs 6 mo f/u imaging (09/2016)  . Diabetes mellitus without complication ([place])    Insulin dependent; DKA admission 10/2017.  [name] Heart murmur, systolic    Noted in prior PCP's records.  I recommended echocardiogram 07/2016 but pt declined.  [name] History of Bell's palsy   . History of chemotherapy   . Hyperlipidemia   . Hypertension   . Pancreas cyst 03/2016   Repeat imaging with MRI pancreas protocol 09/2016  . Umbilical hernia    Repaired when he got his bladder surgery.    Past Surgical History:  Procedure Laterality Date  . CARDIAC CATHETERIZATION  02/09/2013   small vessel dz of 2nd diag branch with 70-80% stenosis--preserved LV function. Medical therapy rec'd. Agustin Cree, PA)  .  CARDIOVASCULAR STRESS TEST  01/26/2013   No ischemia.  Wall motion abnormalities at inferior base suggesting small area of infarction.  EF 64%.  . CAROTID ARTERY DOPPLERS  10/15/2017   1-39% stenosis R ICA, no stenosis L ICA.  [name] CATARACT EXTRACTION    . cystoprostatectomy w/LN surgery  10/17/2016   Ileal conduit: this is an incontinent urine diversion that directs urine from the  ureters through a segment of isolated bowel to the surface of the abdominal wall via a cutaneous stoma, and urine drains continuously into an external ostomy appliance.  . CYSTOSCOPY WITH INJECTION N/A 10/17/2016   Procedure: CYSTOSCOPY WITH INJECTION OF INDOCYANINE GREEN DYE;  Surgeon: Alexis Frock, MD;  Location: [facility];  Service: Urology;  Laterality: N/A;  . EYE SURGERY     cataract surgery bilat   . IR GENERIC HISTORICAL  05/12/2016   IR US GUIDE VASC ACCESS RIGHT 05/12/2016 Greggory Keen, MD [facility]  . IR GENERIC HISTORICAL  05/12/2016   IR FLUORO GUIDE PORT INSERTION RIGHT 05/12/2016 Greggory Keen, MD [facility]  . IR REMOVAL TUN ACCESS W/ PORT W/O FL MOD SED  07/03/2017  . OSTOMY     urostomy  . TONSILLECTOMY AND ADENOIDECTOMY  Remote  . TRANSTHORACIC ECHOCARDIOGRAM  10/16/2017   EF 65-70%, normal wall motion, grd I DD.  [name] TRANSURETHRAL RESECTION OF PROSTATE N/A 04/02/2016   Procedure: CYSTOSCOPY, TRANSURETHRAL RESECTION OF BLADDER TUMOR;  Surgeon: [name], MD;  Location: [facility];  Service: Urology;  Laterality: N/A;  . UMBILICAL HERNIA REPAIR  10/2016  . VASECTOMY       reports that he has quit smoking. He quit after 3.00 years of use. He has never used smokeless tobacco. He reports that he does not drink alcohol or use drugs.  Allergies  Allergen Reactions  . Other Swelling    Pt states had swelling of the eye when having eye surgery and the anesthetic placed in eye     Family History  Problem Relation Age of Onset  . Diabetes Mellitus I Mother   . Diabetes Mellitus I Sister   . Stroke Paternal Grandfather      Prior to Admission medications   Medication Sig Start Date End Date Taking? Authorizing Provider  aspirin EC 81 MG tablet Take 1 tablet (81 mg total) by mouth daily. 11/23/17  Yes Rosalin Hawking, MD  insulin aspart (NOVOLOG FLEXPEN) 100 UNIT/ML FlexPen CBG < 70: implement hypoglycemia protocol CBG 70 - 120: 3 units CBG 121 - 150: 5 units CBG 151  - 200: 6 units CBG 201 - 250: 8 units CBG 251 - 300: 11 units CBG 301 - 350: 14 units CBG 351 - 400: 18 units CBG > 400: call MD 10/16/17  Yes Patrecia Pour, MD  insulin glargine (LANTUS) 100 unit/mL SOPN Inject 0.09 mLs (9 Units total) into the skin daily. 11/04/17  Yes McGowen, [name], MD  polyethylene glycol (MIRALAX / GLYCOLAX) packet Take 17 g by mouth daily.   Yes [provider]  pravastatin (PRAVACHOL) 20 MG tablet TAKE 1 TABLET BY MOUTH EVERY DAY 10/23/17  Yes McGowen, [name], MD  terbinafine (LAMISIL) 250 MG tablet 1 tab po qd x 90 days 11/04/17  Yes McGowen, [name], MD  Continuous Blood Gluc Sensor (FREESTYLE LIBRE 14 DAY SENSOR) MISC 1 each by Does not apply route daily. 11/17/17   McGowen, [name], MD  Insulin Pen Needle (PEN NEEDLES) 32G X 4 MM MISC 1 each by Does  not apply route daily. 10/07/17   McGowen, [name], MD  Insulin Pen Needle 31G X 5 MM MISC Inject insulin via insulin pen 3 x daily with meals 11/17/17   Tammi Sou, MD    Physical Exam: Vitals:   12/17/17 1049 12/17/17 1239 12/17/17 1245 12/17/17 1255  BP:  108/60 110/62 109/61  Pulse: 89 69 71 68  Resp: 16 14 12 16   Temp: 97.7 F (36.5 C) 98.2 F (36.8 C)  97.8 F (36.6 C)  TempSrc: Oral Oral  Oral  SpO2: 100%  100% 100%    Constitutional: NAD, calm, comfortable,Pale Vitals:   12/17/17 1049 12/17/17 1239 12/17/17 1245 12/17/17 1255  BP:  108/60 110/62 109/61  Pulse: 89 69 71 68  Resp: 16 14 12 16   Temp: 97.7 F (36.5 C) 98.2 F (36.8 C)  97.8 F (36.6 C)  TempSrc: Oral Oral  Oral  SpO2: 100%  100% 100%   Eyes: PERRL, lids and conjunctivae normal ENMT: Mucous membranes are moist. Posterior pharynx clear of any exudate or lesions.Normal dentition.  Neck: normal, supple, no masses, no thyromegaly Respiratory: clear to auscultation bilaterally, no wheezing, no crackles. Normal respiratory effort. No accessory muscle use.  Cardiovascular: Regular rate and rhythm, no murmurs / rubs /  gallops. No extremity edema. 2+ pedal pulses. No carotid bruits.  Abdomen: no tenderness, no masses palpated. No hepatosplenomegaly. Bowel sounds positive. Urostomy Musculoskeletal: no clubbing / cyanosis. No joint deformity upper and lower extremities. Good ROM, no contractures. Normal muscle tone.  Skin: no rashes, lesions, ulcers. No induration Neurologic: CN 2-12 grossly intact. Sensation intact, DTR normal. Strength 5/5 in all 4.  Psychiatric: Normal judgment and insight. Alert and oriented x 3. Normal mood.   Foley Catheter:None  Labs on Admission: I have personally reviewed following labs and imaging studies  CBC: Recent Labs  Lab 12/17/17 0953 12/17/17 1127  WBC 7.4 6.6  NEUTROABS 5.5 4.8  HGB 5.2* 4.9*  HCT 17.0* 15.7*  MCV 82.9 83.1  PLT 537* 161*   Basic Metabolic Panel: Recent Labs  Lab 12/17/17 0953 12/17/17 1127  NA 139 141  K 4.7 4.5  CL 108 110  CO2 20* 22  GLUCOSE 192* 164*  BUN 39* 42*  CREATININE 1.28 1.26*  CALCIUM 8.4 8.2*   GFR: Estimated Creatinine Clearance: 50.8 mL/min (A) (by C-G formula based on SCr of 1.26 mg/dL (H)). Liver Function Tests: Recent Labs  Lab 12/17/17 0953 12/17/17 1127  AST 12 11*  ALT 10 12*  ALKPHOS 70 57  BILITOT <0.2* 0.2*  PROT 6.2* 6.2*  ALBUMIN 2.7* 2.6*   No results for input(s): LIPASE, AMYLASE in the last 168 hours. No results for input(s): AMMONIA in the last 168 hours. Coagulation Profile: Recent Labs  Lab 12/17/17 1127  INR 1.07   Cardiac Enzymes: No results for input(s): CKTOTAL, CKMB, CKMBINDEX, TROPONINI in the last 168 hours. BNP (last 3 results) No results for input(s): PROBNP in the last 8760 hours. HbA1C: No results for input(s): HGBA1C in the last 72 hours. CBG: No results for input(s): GLUCAP in the last 168 hours. Lipid Profile: No results for input(s): CHOL, HDL, LDLCALC, TRIG, CHOLHDL, LDLDIRECT in the last 72 hours. Thyroid Function Tests: No results for input(s): TSH, T4TOTAL,  FREET4, T3FREE, THYROIDAB in the last 72 hours. Anemia Panel: No results for input(s): VITAMINB12, FOLATE, FERRITIN, TIBC, IRON, RETICCTPCT in the last 72 hours. Urine analysis:    Component Value Date/Time   COLORURINE YELLOW 12/17/2017 1219  APPEARANCEUR HAZY (A) 12/17/2017 1219   LABSPEC 1.011 12/17/2017 1219   PHURINE 6.0 12/17/2017 1219   GLUCOSEU NEGATIVE 12/17/2017 1219   HGBUR SMALL (A) 12/17/2017 1219   BILIRUBINUR NEGATIVE 12/17/2017 1219   BILIRUBINUR negative 04/14/2016 1456   KETONESUR NEGATIVE 12/17/2017 1219   PROTEINUR NEGATIVE 12/17/2017 1219   UROBILINOGEN 0.2 04/14/2016 1456   NITRITE POSITIVE (A) 12/17/2017 1219   LEUKOCYTESUR LARGE (A) 12/17/2017 1219    Radiological Exams on Admission: No results found.   Assessment/Plan Principal Problem:   GI bleed Active Problems:   Diabetes mellitus without complication (H[facility])   Malignant neoplasm of urinary bladder ([facility])   Acute blood loss anemia  GI bleed: Most likely upper GI bleed.  Patient was having black tarry stools at home with a large bowel movement this morning.  Denies any abdominal pain.  Never had endoscopy or colonoscopy in the past.  GI consulted by the emergency physician.  Started on Protonix drip.  We will keep him n.p.o.  Continue gentle IV fluids.  Acute blood loss anemia: Secondary to GI bleed.  He is being transfused with 3 units of PRBC.  We will continue to monitor his H&H.  Hemodynamically stable at present.  Malignant neoplasm of urinary bladder:H/O urothelial carcinoma of the bladder diagnosed in September 2017, status post TURBT on 04/02/2016.  Follows with Dr. Alen Blew.  Status post 3 cycles of chemotherapy completed in December 2017.  Status post robotic assisted laparoscopic radical cystoprostatectomy and bilateral pelvic lymphadenectomy on 10/17/16.S/P urostomy placement. Also follows with urology Dr. Tresa Moore.  UTI: UA done here suggestive of  UTI.  Family reports that he was recently  treated with antibiotics as an outpatient.  Will check urine culture.Started on ceftriaxone.  Denies fever or chills.  History of diabetes mellitus: On insulin at home.  Continue sliding-scale insulin here.  Hyperlipidemia :On statin  Recent history of ischemic stroke: No residual weakness.  On aspirin and statin at home.  Aspirin held due to GI bleed.    Severity of Illness: The appropriate patient status for this patient is INPATIENT.  DVT prophylaxis: SCD Code Status: Full Family Communication: Daughter present at the bedside Consults called: GI called by ED     Shelly Coss MD [facility] Pager [phone]  If 7PM-7AM, please contact night-coverage [email or web address] Password [email or web address]  12/17/2017, 1:11 PM

## 2017-12-17 NOTE — Consult Note (Addendum)
Referring Provider: [facility]   Primary Care Physician:  Tammi Sou, MD Primary Gastroenterologist:  None, unassigned  Reason for Consultation:  GI bleed    ASSESSMENT AND PLAN:    #1. 82 yo male with melena, profound anemia. Hgb down from mid 13 in April to 4.9. He takes a daily ASA. Rule out PUD, AVMs, neoplasm -patient will need EGD. Will arrange for this to be done tomorrow. The risks and benefits of EGD were discussed and the patient agrees to proceed. Daughter present for conversation. All questions answered.  -Continue IV PPI -Clears okay today, NPO after MIN -Getting 1 of 3 units of blood now.   #2. Hx of T2N0 bladder cancer, s/p neoadjuvant chemo and TURBT in 2017. No evidence for metastatic disease per Oncology.   #3. CVA in April  #4. Chronic calcific pancreatitis.    HPI: Shane [name] is a 82 y.o. male with hx of DM, stroke in April 2019, chronic calcific pancreatitis, CAD and T2N0 high grade bladder cancer, s/p neoadjuvant chemo and TURBT Sept 2017. No evidence for mets based on Sept 2017 imaging. He presented to ED today with weakness,  dark stools, and new profound anemia. His hgb early April was mid 13, down to 4.9 today  He has had increasing fatigue and DOE over last few days.Paitent doesn't know how long he has been having black stools but feels onset fairly recent. He had a black stool today. He can't provide details but has recently had some diffuse lower abdominal discomfort and lower back discomfort but attributes back pain to excessive walking at granddaughter's graduation. He denies constipation, color change is only bowel complaint. No urinary sx, has ostomy. Never had a colonoscopy. No hx of PUD. Takes asa 325 mg daily. Had fever around [facility] and took a few doses of Advil, otherwise , no  regular use of NSAIDs.     Past Medical History:  Diagnosis Date  . Abnormal EKG 2014   This led to stress test which was abnl, which led to  cardiology referral: cath showed small vessel dz of 2nd diag branch with 70-80% stenosis--preserved LV function  . Bilateral renal cysts    Non-complex (CT 03/2016): no enhancement, coarse calcifications, mass effect does give false appearance of hydro on non-contract series (followed by urol).  . Bladder cancer ([place]) 03/2016   Invasive high grade urothelial carcinoma.  No mets at dx.  Cystoscopy + bx and partial resection of tumor in hosp when admitted for gross hematuria and AUR.  Neoadj chemo 06/2016, then got cystoprostatectomy. (Urostomy--ileal conduit urinary diversion with refluxing anastamoses--has RLQ urostomy)-gets routine surveillance by urol (Dr. Tresa Moore), most recent 04/2017--no sign of recurrence--obs status  . CAD (coronary artery disease)    small vessel dz x 1 vessel on cath 2014: med mgmt  . Cerebellar cerebrovascular accident (CVA) without late effect 10/2017   Left, punctate: DAPT therapy x 3 wks per Dr. Leonie Man, neuro, then ASA alone.  . Chronic calcific pancreatitis ([place]) 03/2016   CT: also small cyst in head of pancreas that needs 6 mo f/u imaging (09/2016)  . Diabetes mellitus without complication ([place])    Insulin dependent; DKA admission 10/2017.  [name] Heart murmur, systolic    Noted in prior PCP's records.  I recommended echocardiogram 07/2016 but pt declined.  [name] History of Bell's palsy   . History of chemotherapy   . Hyperlipidemia   . Hypertension   . Pancreas cyst 03/2016   Repeat imaging  with MRI pancreas protocol 09/2016  . Umbilical hernia    Repaired when he got his bladder surgery.    Past Surgical History:  Procedure Laterality Date  . CARDIAC CATHETERIZATION  02/09/2013   small vessel dz of 2nd diag branch with 70-80% stenosis--preserved LV function. Medical therapy rec'd. Agustin Cree, PA)  . CARDIOVASCULAR STRESS TEST  01/26/2013   No ischemia.  Wall motion abnormalities at inferior base suggesting small area of infarction.  EF 64%.  . CAROTID ARTERY DOPPLERS   10/15/2017   1-39% stenosis R ICA, no stenosis L ICA.  [name] CATARACT EXTRACTION    . cystoprostatectomy w/LN surgery  10/17/2016   Ileal conduit: this is an incontinent urine diversion that directs urine from the ureters through a segment of isolated bowel to the surface of the abdominal wall via a cutaneous stoma, and urine drains continuously into an external ostomy appliance.  . CYSTOSCOPY WITH INJECTION N/A 10/17/2016   Procedure: CYSTOSCOPY WITH INJECTION OF INDOCYANINE GREEN DYE;  Surgeon: Alexis Frock, MD;  Location: [facility];  Service: Urology;  Laterality: N/A;  . EYE SURGERY     cataract surgery bilat   . IR GENERIC HISTORICAL  05/12/2016   IR US GUIDE VASC ACCESS RIGHT 05/12/2016 Greggory Keen, MD [facility]  . IR GENERIC HISTORICAL  05/12/2016   IR FLUORO GUIDE PORT INSERTION RIGHT 05/12/2016 Greggory Keen, MD [facility]  . IR REMOVAL TUN ACCESS W/ PORT W/O FL MOD SED  07/03/2017  . OSTOMY     urostomy  . TONSILLECTOMY AND ADENOIDECTOMY  Remote  . TRANSTHORACIC ECHOCARDIOGRAM  10/16/2017   EF 65-70%, normal wall motion, grd I DD.  [name] TRANSURETHRAL RESECTION OF PROSTATE N/A 04/02/2016   Procedure: CYSTOSCOPY, TRANSURETHRAL RESECTION OF BLADDER TUMOR;  Surgeon: [name], MD;  Location: [facility];  Service: Urology;  Laterality: N/A;  . UMBILICAL HERNIA REPAIR  10/2016  . VASECTOMY      Prior to Admission medications   Medication Sig Start Date End Date Taking? Authorizing Provider  aspirin EC 81 MG tablet Take 1 tablet (81 mg total) by mouth daily. 11/23/17  Yes Rosalin Hawking, MD  insulin aspart (NOVOLOG FLEXPEN) 100 UNIT/ML FlexPen CBG < 70: implement hypoglycemia protocol CBG 70 - 120: 3 units CBG 121 - 150: 5 units CBG 151 - 200: 6 units CBG 201 - 250: 8 units CBG 251 - 300: 11 units CBG 301 - 350: 14 units CBG 351 - 400: 18 units CBG > 400: call MD 10/16/17  Yes Patrecia Pour, MD  insulin glargine (LANTUS) 100 unit/mL SOPN Inject 0.09 mLs (9 Units total) into the  skin daily. 11/04/17  Yes McGowen, [name], MD  polyethylene glycol (MIRALAX / GLYCOLAX) packet Take 17 g by mouth daily.   Yes [provider]  pravastatin (PRAVACHOL) 20 MG tablet TAKE 1 TABLET BY MOUTH EVERY DAY 10/23/17  Yes McGowen, [name], MD  terbinafine (LAMISIL) 250 MG tablet 1 tab po qd x 90 days 11/04/17  Yes McGowen, [name], MD  Continuous Blood Gluc Sensor (FREESTYLE LIBRE 14 DAY SENSOR) MISC 1 each by Does not apply route daily. 11/17/17   McGowen, [name], MD  Insulin Pen Needle (PEN NEEDLES) 32G X 4 MM MISC 1 each by Does not apply route daily. 10/07/17   McGowen, [name], MD  Insulin Pen Needle 31G X 5 MM MISC Inject insulin via insulin pen 3 x daily with meals 11/17/17   McGowen, [name], MD  Current Facility-Administered Medications  Medication Dose Route Frequency Provider Last Rate Last Dose  . 0.9 %  sodium chloride infusion  10 mL/hr Intravenous Once Drenda Freeze, MD      . 0.9 %  sodium chloride infusion   Intravenous Continuous Adhikari, Amrit, MD      . cefTRIAXone (ROCEPHIN) 1 g in sodium chloride 0.9 % 100 mL IVPB  1 g Intravenous Q24H Adhikari, Amrit, MD      . insulin aspart (novoLOG) injection 0-5 Units  0-5 Units Subcutaneous QHS Adhikari, Amrit, MD      . insulin aspart (novoLOG) injection 0-9 Units  0-9 Units Subcutaneous TID WC Adhikari, Amrit, MD      . pantoprazole (PROTONIX) 80 mg in sodium chloride 0.9 % 250 mL (0.32 mg/mL) infusion  8 mg/hr Intravenous Continuous Drenda Freeze, MD 25 mL/hr at 12/17/17 1237 8 mg/hr at 12/17/17 1237  . [START ON 12/18/2017] pravastatin (PRAVACHOL) tablet 20 mg  20 mg Oral q1800 Shelly Coss, MD       Current Outpatient Medications  Medication Sig Dispense Refill  . aspirin EC 81 MG tablet Take 1 tablet (81 mg total) by mouth daily.    . insulin aspart (NOVOLOG FLEXPEN) 100 UNIT/ML FlexPen CBG < 70: implement hypoglycemia protocol CBG 70 - 120: 3 units CBG 121 - 150: 5 units CBG 151 - 200: 6  units CBG 201 - 250: 8 units CBG 251 - 300: 11 units CBG 301 - 350: 14 units CBG 351 - 400: 18 units CBG > 400: call MD 15 mL 0  . insulin glargine (LANTUS) 100 unit/mL SOPN Inject 0.09 mLs (9 Units total) into the skin daily.    . polyethylene glycol (MIRALAX / GLYCOLAX) packet Take 17 g by mouth daily.    . pravastatin (PRAVACHOL) 20 MG tablet TAKE 1 TABLET BY MOUTH EVERY DAY 90 tablet 1  . terbinafine (LAMISIL) 250 MG tablet 1 tab po qd x 90 days 90 tablet 0  . Continuous Blood Gluc Sensor (FREESTYLE LIBRE 14 DAY SENSOR) MISC 1 each by Does not apply route daily. 1 each 5  . Insulin Pen Needle (PEN NEEDLES) 32G X 4 MM MISC 1 each by Does not apply route daily. 100 each 11  . Insulin Pen Needle 31G X 5 MM MISC Inject insulin via insulin pen 3 x daily with meals 100 each 3    Allergies as of 12/17/2017 - Review Complete 12/17/2017  Allergen Reaction Noted  . Other Swelling 07/03/2017    Family History  Problem Relation Age of Onset  . Diabetes Mellitus I Mother   . Diabetes Mellitus I Sister   . Stroke Paternal Grandfather     Social History   Socioeconomic History  . Marital status: Married    Spouse name: Not on file  . Number of children: Not on file  . Years of education: Not on file  . Highest education level: Not on file  Occupational History  . Not on file  Social Needs  . Financial resource strain: Not on file  . Food insecurity:    Worry: Not on file    Inability: Not on file  . Transportation needs:    Medical: Not on file    Non-medical: Not on file  Tobacco Use  . Smoking status: Former Smoker    Years: 3.00  . Smokeless tobacco: Never Used  Substance and Sexual Activity  . Alcohol use: No    Frequency: Never  . Drug  use: No  . Sexual activity: Not on file  Lifestyle  . Physical activity:    Days per week: Not on file    Minutes per session: Not on file  . Stress: Not on file  Relationships  . Social connections:    Talks on phone: Not on file     Gets together: Not on file    Attends religious service: Not on file    Active member of club or organization: Not on file    Attends meetings of clubs or organizations: Not on file    Relationship status: Not on file  . Intimate partner violence:    Fear of current or ex partner: Not on file    Emotionally abused: Not on file    Physically abused: Not on file    Forced sexual activity: Not on file  Other Topics Concern  . Not on file  Social History Narrative   Married, 3 children, 7 grandchildren.   Occupation: retired Engineer, site.   No tob, rare alc.    Review of Systems: All systems reviewed and negative except where noted in HPI.  Physical Exam: Vital signs in last 24 hours: Temp:  [97.7 F (36.5 C)-98.4 F (36.9 C)] 97.8 F (36.6 C) (06/06 1255) Pulse Rate:  [68-89] 71 (06/06 1415) Resp:  [12-27] 16 (06/06 1415) BP: (87-121)/(28-67) 118/67 (06/06 1415) SpO2:  [98 %-100 %] 100 % (06/06 1415) Weight:  [197 lb 6.4 oz (89.5 kg)] 197 lb 6.4 oz (89.5 kg) (06/06 1017)   General:   Alert, well-developed white male in NAD Psych:  Pleasant, cooperative. Normal mood and affect. Eyes:  Pupils equal, sclera clear, no icterus.   Conjunctiva pale. Ears:  Normal auditory acuity. Nose:  No deformity, discharge,  or lesions. Neck:  Supple; no masses Lungs:  Clear throughout to auscultation.   No wheezes, crackles, or rhonchi.  Heart:  Regular rate and rhythm; no murmurs, no edema Abdomen:  Soft, non-distended, nontender, BS active, no palp mass    Rectal:  Deferred  Msk:  Symmetrical without gross deformities. . Neurologic:  Alert and  oriented x4;  grossly normal neurologically. Skin:  Intact without significant lesions or rashes..   Intake/Output from previous day: No intake/output data recorded. Intake/Output this shift: No intake/output data recorded.  Lab Results: Recent Labs    12/17/17 0953 12/17/17 1127  WBC 7.4 6.6  HGB 5.2* 4.9*  HCT 17.0* 15.7*  PLT 537* 545*    BMET Recent Labs    12/17/17 0953 12/17/17 1127  NA 139 141  K 4.7 4.5  CL 108 110  CO2 20* 22  GLUCOSE 192* 164*  BUN 39* 42*  CREATININE 1.28 1.26*  CALCIUM 8.4 8.2*   LFT Recent Labs    12/17/17 1127  PROT 6.2*  ALBUMIN 2.6*  AST 11*  ALT 12*  ALKPHOS 57  BILITOT 0.2*   PT/INR Recent Labs    12/17/17 1127  LABPROT 13.8  INR 1.07   Hepatitis Panel No results for input(s): HEPBSAG, HCVAB, HEPAIGM, HEPBIGM in the last 72 hours.   Studies/Results: No results found.   Tye Savoy, NP-C @  12/17/2017, 2:30 PM   I have reviewed the entire case in detail with the above APP and discussed the plan in detail.  Therefore, I agree with the diagnoses recorded above. In addition,  I have personally interviewed and examined the patient and have personally reviewed any abdominal/pelvic CT scan images.  My additional thoughts are as follows:  Stable upper GI bleed that has probably been going on at least several days.  He has severe acute blood loss anemia suspected to be from an upper GI source, probable ulcer or AVM, less likely neoplasia.  He looks well, has no abdominal pain, chest pain or dyspnea.  He is comfortable at rest and mainly just profoundly fatigued.  He and his wife state that he has not previously had GI bleeding.  He needs at least 3 units of PRBCs overnight, which looks like that may be slow to run since he has fair IV access.  I would like to do an upper endoscopy on him tomorrow after he has had adequate PRBC transfusion.  He is agreeable after thorough discussion of procedure and risks.  The benefits and risks of the planned procedure were described in detail with the patient or (when appropriate) their health care proxy.  Risks were outlined as including, but not limited to, bleeding, infection, perforation, adverse medication reaction leading to cardiac or pulmonary decompensation, or pancreatitis (if ERCP).  The limitation of incomplete mucosal  visualization was also discussed.  No guarantees or warranties were given.  Patient at increased risk for cardiopulmonary complications of procedure due to medical comorbidities.   He is on a Protonix drip, and will remain on that at least until after his upper endoscopy.  Further plans to follow.   [place]   '

## 2017-12-17 NOTE — ED Notes (Signed)
Attempt peripheral and US guided IV unsuccessful. Quick RN at bedside attempting US guided IV at present.

## 2017-12-17 NOTE — Progress Notes (Signed)
Pt unable to sign consent for EGD, please review procedure with pt again.  Kizzie Ide, RN

## 2017-12-17 NOTE — Progress Notes (Signed)
Hematology and Oncology Follow Up Visit  Shane [name] [id number] Mar 03, 1936 82 y.o. 12/17/2017 10:06 AM McGowen, [name], MDMcGowen, [name], MD   Principle Diagnosis: 82 year old man with T2N0 high-grade urothelial carcinoma of the bladder diagnosed in September 2017.   Prior Therapy:  He is status post TURBT on 04/02/2016 which confirmed the tumor invades into the muscularis propria. He has no evidence of metastatic disease based on his abdominal imaging as well as chest x-ray. This was completed on 04/02/2016.  Neoadjuvant chemotherapy utilizing gemcitabine and cisplatin day 1 of cycle 1 of therapy was on 05/16/2016. He is status post 3 cycles of therapy completed in December 2017.  He is status post robotic-assisted laparoscopic radical cystoprostatectomy and bilateral pelvic lymphadenectomy completed by Dr. Tresa Moore on 10/17/2016. Final pathology revealed no residual tumor.   Current therapy: Active surveillance.   Interim History: Shane [name] is here for a follow-up.  Since last visit, he was hospitalized in April 2019 for hyperglycemia and during his hospitalization he was noted to have normal hemoglobin of 13.6.  In the last few weeks he has been noted to have symptoms of fatigue and dyspnea on exertion and has been diagnosed with a urinary tract infection as well.  Imaging studies of the abdomen and pelvis obtained in the end of March 2019 did not show any evidence of malignancy.  He does report discoloration of his stool with dark stool at times.  His urine is clear today.  He is exhausted with dyspnea on exertion that has worsened in the last few days.  He denies any abdominal pain, flank pain or frank hematuria.  He denies any hematochezia or hematemesis.  His performance status has been limited because of excessive fatigue and shortness of breath.  He does not report any headaches, blurry vision, syncope or seizures. He does not report any fevers or chills or sweats. He does not  report any cough, wheezing or hemoptysis. He does not report any nausea, vomiting or abdominal pain. He does not report any constipation, diarrhea or change in his bowel habits. He does not report any frequency, urgency or hesitancy.  He denies any ecchymosis or petechiae.  He denies any arthralgias or myalgias.  The remaining review of systems is negative.   Medications: I have reviewed the patient's current medications.  Current Outpatient Medications  Medication Sig Dispense Refill  . aspirin EC 81 MG tablet Take 1 tablet (81 mg total) by mouth daily.    . Continuous Blood Gluc Sensor (FREESTYLE LIBRE 14 DAY SENSOR) MISC 1 each by Does not apply route daily. 1 each 5  . insulin aspart (NOVOLOG FLEXPEN) 100 UNIT/ML FlexPen CBG < 70: implement hypoglycemia protocol CBG 70 - 120: 3 units CBG 121 - 150: 5 units CBG 151 - 200: 6 units CBG 201 - 250: 8 units CBG 251 - 300: 11 units CBG 301 - 350: 14 units CBG 351 - 400: 18 units CBG > 400: call MD 15 mL 0  . insulin glargine (LANTUS) 100 unit/mL SOPN Inject 0.09 mLs (9 Units total) into the skin daily.    . Insulin Pen Needle (PEN NEEDLES) 32G X 4 MM MISC 1 each by Does not apply route daily. 100 each 11  . Insulin Pen Needle 31G X 5 MM MISC Inject insulin via insulin pen 3 x daily with meals 100 each 3  . polyethylene glycol (MIRALAX / GLYCOLAX) packet Take 17 g by mouth daily.    . pravastatin (PRAVACHOL) 20 MG  tablet TAKE 1 TABLET BY MOUTH EVERY DAY 90 tablet 1  . terbinafine (LAMISIL) 250 MG tablet 1 tab po qd x 90 days 90 tablet 0   No current facility-administered medications for this visit.      Allergies:  Allergies  Allergen Reactions  . Other Swelling    Pt states had swelling of the eye when having eye surgery and the anesthetic placed in eye     Past Medical History, Surgical history, Social history, and Family History were reviewed and updated.   Physical Exam: Blood pressure (!) 87/36, pulse 80, temperature 98.4 F  (36.9 C), temperature source Oral, resp. rate 18, height 5\' 8"  (1.727 m), weight 197 lb 6.4 oz (89.5 kg), SpO2 99 %.   ECOG: 1 General appearance: Well-appearing gentleman appeared in mild distress Head: Atraumatic without abnormalities. Oropharynx: Without any thrush or ulcers. Eyes: Sclera anicteric. Lymph nodes: No lymphadenopathy noted in the cervical, supraclavicular, and axillary nodes Heart: Tachycardic without any murmurs or gallops. Lung: Clear to auscultation without any rhonchi, wheezes or dullness to percussion. Stoma appeared clean, dry with clear urine accumulated. Abdomin: Soft, nontender without any rebound or guarding. Musculoskeletal: No joint deformity or effusion. Skin: No rashes or lesions. Neurological: No deficits.   Lab Results: Lab Results  Component Value Date   WBC 8.8 10/14/2017   HGB 13.6 10/14/2017   HCT 39.4 10/14/2017   MCV 85.2 10/14/2017   PLT 281 10/14/2017     Chemistry      Component Value Date/Time   NA 131 (L) 10/21/2017 1627   NA 133 (A) 10/03/2017   NA 139 06/18/2017 1125   K 4.9 10/21/2017 1627   K 4.5 06/18/2017 1125   CL 103 10/21/2017 1627   CO2 17 (L) 10/21/2017 1627   CO2 27 10/02/2017   CO2 21 (L) 06/18/2017 1125   BUN 42 (H) 10/21/2017 1627   BUN 46 (A) 10/03/2017   BUN 26.5 (H) 06/18/2017 1125   CREATININE 0.97 10/21/2017 1627   CREATININE 1.2 06/18/2017 1125   GLU 525 10/03/2017      Component Value Date/Time   CALCIUM 8.4 10/21/2017 1627   CALCIUM 9.3 06/18/2017 1125   ALKPHOS 95 10/03/2017   ALKPHOS 67 06/18/2017 1125   AST 9 (A) 10/03/2017   AST 12 06/18/2017 1125   ALT 10 10/03/2017   ALT 13 06/18/2017 1125   BILITOT 0.40 06/18/2017 2276        82 year old man with thes:  1.  T2N0 high-grade urothelial carcinoma of the bladder diagnosed in September 2017.  He is status post neoadjuvant chemotherapy followed by radical cystectomy completed in April 2018.  There is no residual cancer in his final  pathology.  He has been on active surveillance and last imaging studies obtained in March 2019 showed no convincing evidence of metastatic disease.  His risk of relapse remains low from this malignancy at this point.  Periodic surveillance will be needed with repeat imaging studies at least every 6 months.  Is currently receiving that under the care of Dr. Tresa Moore.  2.  Acute anemia: His hemoglobin today is 5.2 and he is symptomatic.  He is hypotensive and tachycardic and a hemoglobin that has dropped dramatically in the last 2 months.  The differential diagnosis was discussed today which include GI bleeding given his self-reported melena, retroperitoneal bleed versus hemolysis.  I see no evidence of hemolysis at least initially with clear urine and no jaundice.  I highly suspect GI bleeding as  the source for the time being.  I will refer him urgently to the emergency department for evaluation for GI bleeding and urgent hospitalization and transfusion.  Hemolysis work-up may be needed as well for completeness.  I doubt this anemia is related to his malignancy.  3. Follow-up: To be determined at this time pending his work-up of his anemia.  He will need follow-up from his malignancy in 6 months but sooner if other issues has been discovered.  15  minutes was spent with the patient face-to-face today.  More than 50% of time was dedicated to patient counseling, education and coordination of his care.   Zola Button, MD 6/6/201910:06 AM

## 2017-12-17 NOTE — ED Notes (Signed)
ED TO INPATIENT HANDOFF REPORT  Name/Age/Gender Shane [name] 82 y.o. male  Code Status    Code Status Orders  (From admission, onward)        Start     Ordered   12/17/17 1311  Full code  Continuous     12/17/17 1310    Code Status History    Date Active Date Inactive Code Status Order ID Comments User Context   10/12/2017 1738 10/16/2017 2148 Full Code [id number]  [name], MD ED   10/12/2017 1726 10/12/2017 1738 Full Code [id number]  [name], MD ED   04/01/2016 0218 04/02/2016 2349 Full Code [id number]  Ivor Costa, MD Inpatient    Advance Directive Documentation     Most Recent Value  Type of Advance Directive  Healthcare Power of Attorney, Living will  Pre-existing out of facility DNR order (yellow form or pink MOST form)  -  "MOST" Form in Place?  -      Home/SNF/Other Home  Chief Complaint Sore Throat  Level of Care/Admitting Diagnosis ED Disposition    ED Disposition Condition [name]: [facility] [[id number]]  Level of Care: Telemetry [5]  Admit to tele based on following criteria: Other see comments  Comments: GI bleed  Diagnosis: GI bleed [979892]  Admitting Physician: Shelly Coss [[id number]]  Attending Physician: Shelly Coss [[id number]]  Estimated length of stay: past midnight tomorrow  Certification:: I certify this patient will need inpatient services for at least 2 midnights  PT Class (Do Not Modify): Inpatient [101]  PT Acc Code (Do Not Modify): Private [1]       Medical History Past Medical History:  Diagnosis Date  . Abnormal EKG 2014   This led to stress test which was abnl, which led to cardiology referral: cath showed small vessel dz of 2nd diag branch with 70-80% stenosis--preserved LV function  . Bilateral renal cysts    Non-complex (CT 03/2016): no enhancement, coarse calcifications, mass effect does give false appearance of hydro on non-contract series (followed by urol).   . Bladder cancer ([place]) 03/2016   Invasive high grade urothelial carcinoma.  No mets at dx.  Cystoscopy + bx and partial resection of tumor in hosp when admitted for gross hematuria and AUR.  Neoadj chemo 06/2016, then got cystoprostatectomy. (Urostomy--ileal conduit urinary diversion with refluxing anastamoses--has RLQ urostomy)-gets routine surveillance by urol (Dr. Tresa Moore), most recent 04/2017--no sign of recurrence--obs status  . CAD (coronary artery disease)    small vessel dz x 1 vessel on cath 2014: med mgmt  . Cerebellar cerebrovascular accident (CVA) without late effect 10/2017   Left, punctate: DAPT therapy x 3 wks per Dr. Leonie Man, neuro, then ASA alone.  . Chronic calcific pancreatitis ([place]) 03/2016   CT: also small cyst in head of pancreas that needs 6 mo f/u imaging (09/2016)  . Diabetes mellitus without complication ([place])    Insulin dependent; DKA admission 10/2017.  [name] Heart murmur, systolic    Noted in prior PCP's records.  I recommended echocardiogram 07/2016 but pt declined.  [name] History of Bell's palsy   . History of chemotherapy   . Hyperlipidemia   . Hypertension   . Pancreas cyst 03/2016   Repeat imaging with MRI pancreas protocol 09/2016  . Umbilical hernia    Repaired when he got his bladder surgery.    Allergies Allergies  Allergen Reactions  . Other Swelling    Pt states had swelling of the eye  when having eye surgery and the anesthetic placed in eye     IV Location/Drains/Wounds Patient Lines/Drains/Airways Status   Active Line/Drains/Airways    Name:   Placement date:   Placement time:   Site:   Days:   Peripheral IV 12/17/17 Right Antecubital   12/17/17    1126    Antecubital   less than 1   Peripheral IV 12/17/17 Left Antecubital   12/17/17    1220    Antecubital   less than 1   Urostomy Ileal conduit RUQ   10/17/16    1231    RUQ   426   Ureteral Drain/Stent Left ureter 7 Fr.   10/17/16    1230    Left ureter   426   Ureteral Drain/Stent Right ureter 7 Fr.    10/17/16    1231    Right ureter   426   Incision - 3 Ports Abdomen 1: Left;Lateral 2: Right;Mid;Lateral 3: Right;Lateral   10/17/16    0815     426          Labs/Imaging Results for orders placed or performed during the hospital encounter of 12/17/17 (from the past 48 hour(s))  POC occult blood, ED     Status: Abnormal   Collection Time: 12/17/17 11:02 AM  Result Value Ref Range   Fecal Occult Bld POSITIVE (A) NEGATIVE  CBC with Differential/Platelet     Status: Abnormal   Collection Time: 12/17/17 11:27 AM  Result Value Ref Range   WBC 6.6 4.0 - 10.5 K/uL   RBC 1.89 (L) 4.22 - 5.81 MIL/uL   Hemoglobin 4.9 (LL) 13.0 - 17.0 g/dL    Comment: CRITICAL RESULT CALLED TO, READ BACK BY AND VERIFIED WITH: K.HALL RN AT 3086 ON 12/17/17 BY S.VANHOORNE    HCT 15.7 (L) 39.0 - 52.0 %   MCV 83.1 78.0 - 100.0 fL   MCH 25.9 (L) 26.0 - 34.0 pg   MCHC 31.2 30.0 - 36.0 g/dL   RDW 18.6 (H) 11.5 - 15.5 %   Platelets 545 (H) 150 - 400 K/uL   Neutrophils Relative % 72 %   Neutro Abs 4.8 1.7 - 7.7 K/uL   Lymphocytes Relative 24 %   Lymphs Abs 1.6 0.7 - 4.0 K/uL   Monocytes Relative 2 %   Monocytes Absolute 0.2 0.1 - 1.0 K/uL   Eosinophils Relative 1 %   Eosinophils Absolute 0.0 0.0 - 0.7 K/uL   Basophils Relative 1 %   Basophils Absolute 0.1 0.0 - 0.1 K/uL    Comment: Performed at [facility], [place] [address] [place], [place] [address]  Comprehensive metabolic panel     Status: Abnormal   Collection Time: 12/17/17 11:27 AM  Result Value Ref Range   Sodium 141 135 - 145 mmol/L   Potassium 4.5 3.5 - 5.1 mmol/L   Chloride 110 101 - 111 mmol/L   CO2 22 22 - 32 mmol/L   Glucose, Bld 164 (H) 65 - 99 mg/dL   BUN 42 (H) 6 - 20 mg/dL   Creatinine, Ser 1.26 (H) 0.61 - 1.24 mg/dL   Calcium 8.2 (L) 8.9 - 10.3 mg/dL   Total Protein 6.2 (L) 6.5 - 8.1 g/dL   Albumin 2.6 (L) 3.5 - 5.0 g/dL   AST 11 (L) 15 - 41 U/L   ALT 12 (L) 17 - 63 U/L   Alkaline Phosphatase 57 38 - 126 U/L    Total Bilirubin 0.2 (L) 0.3 -  1.2 mg/dL   GFR calc non Af Amer 52 (L) >60 mL/min   GFR calc Af Amer >60 >60 mL/min    Comment: (NOTE) The eGFR has been calculated using the CKD EPI equation. This calculation has not been validated in all clinical situations. eGFR's persistently <60 mL/min signify possible Chronic Kidney Disease.    Anion gap 9 5 - 15    Comment: Performed at [facility], [place] [address], [place], [place] [address]  Protime-INR     Status: None   Collection Time: 12/17/17 11:27 AM  Result Value Ref Range   Prothrombin Time 13.8 11.4 - 15.2 seconds   INR 1.07     Comment: Performed at [facility], [place] [address], [place], [place] [address]  Type and screen     Status: None (Preliminary result)   Collection Time: 12/17/17 11:30 AM  Result Value Ref Range   ABO/RH(D) A POS    Antibody Screen NEG    Sample Expiration 12/20/2017    Unit Number [id number]    Blood Component Type RBC, LR IRR    Unit division 00    Status of Unit ISSUED    Transfusion Status OK TO TRANSFUSE    Crossmatch Result      Compatible Performed at [employer] [address] [place], [place] [address]    Unit Number [id number]    Blood Component Type RED CELLS,LR    Unit division 00    Status of Unit ALLOCATED    Transfusion Status OK TO TRANSFUSE    Crossmatch Result Compatible    Unit Number [id number]    Blood Component Type RED CELLS,LR    Unit division 00    Status of Unit ALLOCATED    Transfusion Status OK TO TRANSFUSE    Crossmatch Result Compatible   Prepare RBC     Status: None   Collection Time: 12/17/17 11:30 AM  Result Value Ref Range   Order Confirmation      ORDER PROCESSED BY BLOOD BANK Performed at [facility], [place] [address] [place], [place] [address]   Urinalysis, Routine w reflex microscopic     Status: Abnormal   Collection Time: 12/17/17 12:19 PM  Result Value Ref Range    Color, Urine YELLOW YELLOW   APPearance HAZY (A) CLEAR   Specific Gravity, Urine 1.011 1.005 - 1.030   pH 6.0 5.0 - 8.0   Glucose, UA NEGATIVE NEGATIVE mg/dL   Hgb urine dipstick SMALL (A) NEGATIVE   Bilirubin Urine NEGATIVE NEGATIVE   Ketones, ur NEGATIVE NEGATIVE mg/dL   Protein, ur NEGATIVE NEGATIVE mg/dL   Nitrite POSITIVE (A) NEGATIVE   Leukocytes, UA LARGE (A) NEGATIVE   RBC / HPF 0-5 0 - 5 RBC/hpf   WBC, UA >50 (H) 0 - 5 WBC/hpf   Bacteria, UA MANY (A) NONE SEEN   Squamous Epithelial / LPF 0-5 0 - 5   Mucus PRESENT     Comment: Performed at [facility], [place] [address] [place], [place] [address]   No results found.  Pending Labs Unresulted Labs (From admission, onward)   Start     Ordered   12/18/17 1093  Basic metabolic panel  Tomorrow morning,   R     12/17/17 1310   12/18/17 0500  CBC  Tomorrow morning,   R     12/17/17 1310   12/17/17 1800  CBC with Differential/Platelet  Once,   R  12/17/17 1310   12/17/17 1326  Culture, blood (routine x 2)  BLOOD CULTURE X 2,   R     12/17/17 1325   12/17/17 1143  Urine culture  STAT,   STAT     12/17/17 1142      Vitals/Pain Today's Vitals   12/17/17 1400 12/17/17 1411 12/17/17 1415 12/17/17 1430  BP: (!) 113/56  118/67 112/67  Pulse: 68  71 70  Resp: (!) '24  16 13  '$ Temp:      TempSrc:      SpO2: 98%  100% 98%  PainSc:  0-No pain      Isolation Precautions No active isolations  Medications Medications  0.9 %  sodium chloride infusion (has no administration in time range)  pantoprazole (PROTONIX) 80 mg in sodium chloride 0.9 % 250 mL (0.32 mg/mL) infusion (8 mg/hr Intravenous New Bag/Given 12/17/17 1237)  0.9 %  sodium chloride infusion (has no administration in time range)  pravastatin (PRAVACHOL) tablet 20 mg (has no administration in time range)  insulin aspart (novoLOG) injection 0-9 Units (has no administration in time range)  insulin aspart (novoLOG) injection 0-5 Units (has no  administration in time range)  cefTRIAXone (ROCEPHIN) 1 g in sodium chloride 0.9 % 100 mL IVPB (has no administration in time range)  pantoprazole (PROTONIX) 80 mg in sodium chloride 0.9 % 100 mL IVPB (0 mg Intravenous Stopped 12/17/17 1259)    Mobility walks with person assist

## 2017-12-17 NOTE — ED Provider Notes (Signed)
[facility] DEPT Provider Note   CSN: [id number] Arrival date & time: 12/17/17  1036     History   Chief Complaint Chief Complaint  Patient presents with  . Abnormal Lab    HPI Shane [name] is a 82 y.o. male history of bladder cancer with urostomy, CAD, previous stroke, diabetes, here presenting with weakness, melena.  Patient states that he has been having black stools for the last week or so.  He also has progressive diffuse weakness.  Denies any abdominal pain or vomiting or fevers.  He recently finished a course of antibiotics for his urinary tract infection.  He went to see his doctor today and had a hemoglobin of 5.2 and sent here for transfusion.  Patient states that he is taking baby aspirin daily but is not on any blood thinners and denies taking any NSAIDs. No previous transfusions and no previous colonoscopy.   The history is provided by the patient.    Past Medical History:  Diagnosis Date  . Abnormal EKG 2014   This led to stress test which was abnl, which led to cardiology referral: cath showed small vessel dz of 2nd diag branch with 70-80% stenosis--preserved LV function  . Bilateral renal cysts    Non-complex (CT 03/2016): no enhancement, coarse calcifications, mass effect does give false appearance of hydro on non-contract series (followed by urol).  . Bladder cancer ([place]) 03/2016   Invasive high grade urothelial carcinoma.  No mets at dx.  Cystoscopy + bx and partial resection of tumor in hosp when admitted for gross hematuria and AUR.  Neoadj chemo 06/2016, then got cystoprostatectomy. (Urostomy--ileal conduit urinary diversion with refluxing anastamoses--has RLQ urostomy)-gets routine surveillance by urol (Dr. Tresa Moore), most recent 04/2017--no sign of recurrence--obs status  . CAD (coronary artery disease)    small vessel dz x 1 vessel on cath 2014: med mgmt  . Cerebellar cerebrovascular accident (CVA) without late effect 10/2017   Left, punctate: DAPT therapy x 3 wks per Dr. Leonie Man, neuro, then ASA alone.  . Chronic calcific pancreatitis ([place]) 03/2016   CT: also small cyst in head of pancreas that needs 6 mo f/u imaging (09/2016)  . Diabetes mellitus without complication ([place])    Insulin dependent; DKA admission 10/2017.  [name] Heart murmur, systolic    Noted in prior PCP's records.  I recommended echocardiogram 07/2016 but pt declined.  [name] History of Bell's palsy   . History of chemotherapy   . Hyperlipidemia   . Hypertension   . Pancreas cyst 03/2016   Repeat imaging with MRI pancreas protocol 09/2016  . Umbilical hernia    Repaired when he got his bladder surgery.    Patient Active Problem List   Diagnosis Date Noted  . Cerebral thrombosis with cerebral infarction 10/15/2017  . Malnutrition of moderate degree 10/13/2017  . DKA (diabetic ketoacidoses) ([place]) 10/12/2017  . Port catheter in place 02/25/2017  . S/P ileal conduit ([place]) 10/17/2016  . Malignant neoplasm of urinary bladder ([place]) 05/01/2016  . Diabetes mellitus without complication ([place]) 63/07/6008  . UTI (urinary tract infection) 04/01/2016  . Hypertension   . Hematoma of bladder wall   . Hematuria 03/31/2016    Past Surgical History:  Procedure Laterality Date  . CARDIAC CATHETERIZATION  02/09/2013   small vessel dz of 2nd diag branch with 70-80% stenosis--preserved LV function. Medical therapy rec'd. Agustin Cree, PA)  . CARDIOVASCULAR STRESS TEST  01/26/2013   No ischemia.  Wall motion abnormalities at inferior base suggesting small  area of infarction.  EF 64%.  . CAROTID ARTERY DOPPLERS  10/15/2017   1-39% stenosis R ICA, no stenosis L ICA.  [name] CATARACT EXTRACTION    . cystoprostatectomy w/LN surgery  10/17/2016   Ileal conduit: this is an incontinent urine diversion that directs urine from the ureters through a segment of isolated bowel to the surface of the abdominal wall via a cutaneous stoma, and urine drains continuously into an external  ostomy appliance.  . CYSTOSCOPY WITH INJECTION N/A 10/17/2016   Procedure: CYSTOSCOPY WITH INJECTION OF INDOCYANINE GREEN DYE;  Surgeon: Alexis Frock, MD;  Location: [facility] ORS;  Service: Urology;  Laterality: N/A;  . EYE SURGERY     cataract surgery bilat   . IR GENERIC HISTORICAL  05/12/2016   IR US GUIDE VASC ACCESS RIGHT 05/12/2016 Greggory Keen, MD [facility]INTER[facility]  . IR GENERIC HISTORICAL  05/12/2016   IR FLUORO GUIDE PORT INSERTION RIGHT 05/12/2016 Greggory Keen, MD [facility]  . IR REMOVAL TUN ACCESS W/ PORT W/O FL MOD SED  07/03/2017  . OSTOMY     urostomy  . TONSILLECTOMY AND ADENOIDECTOMY  Remote  . TRANSTHORACIC ECHOCARDIOGRAM  10/16/2017   EF 65-70%, normal wall motion, grd I DD.  [name] TRANSURETHRAL RESECTION OF PROSTATE N/A 04/02/2016   Procedure: CYSTOSCOPY, TRANSURETHRAL RESECTION OF BLADDER TUMOR;  Surgeon: [name], MD;  Location: [facility];  Service: Urology;  Laterality: N/A;  . UMBILICAL HERNIA REPAIR  10/2016  . VASECTOMY          Home Medications    Prior to Admission medications   Medication Sig Start Date End Date Taking? Authorizing Provider  aspirin EC 81 MG tablet Take 1 tablet (81 mg total) by mouth daily. 11/23/17  Yes Rosalin Hawking, MD  insulin aspart (NOVOLOG FLEXPEN) 100 UNIT/ML FlexPen CBG < 70: implement hypoglycemia protocol CBG 70 - 120: 3 units CBG 121 - 150: 5 units CBG 151 - 200: 6 units CBG 201 - 250: 8 units CBG 251 - 300: 11 units CBG 301 - 350: 14 units CBG 351 - 400: 18 units CBG > 400: call MD 10/16/17  Yes Patrecia Pour, MD  insulin glargine (LANTUS) 100 unit/mL SOPN Inject 0.09 mLs (9 Units total) into the skin daily. 11/04/17  Yes McGowen, [name], MD  polyethylene glycol (MIRALAX / GLYCOLAX) packet Take 17 g by mouth daily.   Yes [provider]  pravastatin (PRAVACHOL) 20 MG tablet TAKE 1 TABLET BY MOUTH EVERY DAY 10/23/17  Yes McGowen, [name], MD  terbinafine (LAMISIL) 250 MG tablet 1 tab po qd x 90 days 11/04/17  Yes  McGowen, [name], MD  Continuous Blood Gluc Sensor (FREESTYLE LIBRE 14 DAY SENSOR) MISC 1 each by Does not apply route daily. 11/17/17   McGowen, [name], MD  Insulin Pen Needle (PEN NEEDLES) 32G X 4 MM MISC 1 each by Does not apply route daily. 10/07/17   McGowen, [name], MD  Insulin Pen Needle 31G X 5 MM MISC Inject insulin via insulin pen 3 x daily with meals 11/17/17   McGowen, [name], MD    Family History Family History  Problem Relation Age of Onset  . Diabetes Mellitus I Mother   . Diabetes Mellitus I Sister   . Stroke Paternal Grandfather     Social History Social History   Tobacco Use  . Smoking status: Former Smoker    Years: 3.00  . Smokeless tobacco: Never Used  Substance Use Topics  . Alcohol  use: No    Frequency: Never  . Drug use: No     Allergies   Other   Review of Systems Review of Systems  Neurological: Positive for weakness.  All other systems reviewed and are negative.    Physical Exam Updated Vital Signs BP (!) 119/47 (BP Location: Left Arm)   Pulse 89   Temp 97.7 F (36.5 C) (Oral)   Resp 16   SpO2 100%   Physical Exam  Constitutional: He is oriented to person, place, and time.  Pale, chronically ill   HENT:  Head: Normocephalic.  Mouth/Throat: Oropharynx is clear and moist.  Eyes:  Conjunctiva pale   Neck: Normal range of motion. Neck supple.  Cardiovascular: Normal rate, regular rhythm and normal heart sounds.  Pulmonary/Chest: Effort normal and breath sounds normal. No stridor. No respiratory distress. He has no wheezes.  Abdominal: Soft. Bowel sounds are normal. He exhibits no distension. There is no tenderness. There is no guarding.  Urostomy in place with clear urine   Musculoskeletal: Normal range of motion.  Neurological: He is alert and oriented to person, place, and time.  Skin: Skin is warm.  Psychiatric: He has a normal mood and affect.  Nursing note and vitals reviewed.    ED Treatments / Results  Labs (all  labs ordered are listed, but only abnormal results are displayed) Labs Reviewed  COMPREHENSIVE METABOLIC PANEL - Abnormal; Notable for the following components:      Result Value   Glucose, Bld 164 (*)    BUN 42 (*)    Creatinine, Ser 1.26 (*)    Calcium 8.2 (*)    Total Protein 6.2 (*)    Albumin 2.6 (*)    AST 11 (*)    ALT 12 (*)    Total Bilirubin 0.2 (*)    GFR calc non Af Amer 52 (*)    All other components within normal limits  POC OCCULT BLOOD, ED - Abnormal; Notable for the following components:   Fecal Occult Bld POSITIVE (*)    All other components within normal limits  URINE CULTURE  PROTIME-INR  CBC WITH DIFFERENTIAL/PLATELET  URINALYSIS, ROUTINE W REFLEX MICROSCOPIC  TYPE AND SCREEN  PREPARE RBC (CROSSMATCH)    EKG None  Radiology No results found.  Procedures Procedures (including critical care time)   CRITICAL CARE Performed by: Wandra Arthurs   Total critical care time: 30 minutes  Critical care time was exclusive of separately billable procedures and treating other patients.  Critical care was necessary to treat or prevent imminent or life-threatening deterioration.  Critical care was time spent personally by me on the following activities: development of treatment plan with patient and/or surrogate as well as nursing, discussions with consultants, evaluation of patient's response to treatment, examination of patient, obtaining history from patient or surrogate, ordering and performing treatments and interventions, ordering and review of laboratory studies, ordering and review of radiographic studies, pulse oximetry and re-evaluation of patient's condition.   Medications Ordered in ED Medications  0.9 %  sodium chloride infusion (has no administration in time range)  pantoprazole (PROTONIX) 80 mg in sodium chloride 0.9 % 100 mL IVPB (has no administration in time range)  pantoprazole (PROTONIX) 80 mg in sodium chloride 0.9 % 250 mL (0.32 mg/mL)  infusion (has no administration in time range)     Initial Impression / Assessment and Plan / ED Course  I have reviewed the triage vital signs and the nursing notes.  Pertinent labs &  imaging results that were available during my care of the patient were reviewed by me and considered in my medical decision making (see chart for details).     Shane [name] is a 82 y.o. male here with weakness, likely symptomatic anemia. Patient is on ASA 81 mg daily and had melena so I am concerned for possible upper GI bleed. Will repeat labs, start protonix drip, transfuse 3 U PRBC. Will consult GI.   12:33 PM Hg 5.2. Cr 1.2. I talked to Ardoch from [facility] to see patient. Ordered protonix drip, blood transfusion. Will admit for upper GI bleed with melena.   Final Clinical Impressions(s) / ED Diagnoses   Final diagnoses:  None    ED Discharge Orders    None       Drenda Freeze, MD 12/17/17 1234

## 2017-12-17 NOTE — ED Triage Notes (Signed)
Pt from [facility] with abnormal lab; hemoglobin of 5.2. Denies pain, reports "tiredness."

## 2017-12-17 NOTE — ED Notes (Signed)
Date and time results received: 12/17/17 12:31 PM   (use smartphrase ".now" to insert current time)  Test: hgb 4.9 Critical Value: 4.9  Name of Provider Notified: Leafy Ro RN   Orders Received? Or Actions Taken?:

## 2017-12-17 NOTE — ED Notes (Signed)
Bed: WA09 Expected date:  Expected time:  Means of arrival:  Comments: [place] abnormal lab

## 2017-12-17 NOTE — Progress Notes (Signed)
EGD consent obtained at 1753

## 2017-12-18 ENCOUNTER — Encounter (HOSPITAL_COMMUNITY): Payer: Self-pay | Admitting: Anesthesiology

## 2017-12-18 ENCOUNTER — Inpatient Hospital Stay (HOSPITAL_COMMUNITY): Payer: Medicare Other | Admitting: Anesthesiology

## 2017-12-18 ENCOUNTER — Encounter (HOSPITAL_COMMUNITY)
Admission: EM | Disposition: A | Discharge status: Home / Self Care | Payer: Self-pay | Source: Home / Self Care | Attending: Internal Medicine

## 2017-12-18 ENCOUNTER — Telehealth: Payer: Self-pay

## 2017-12-18 DIAGNOSIS — K921 Melena: Secondary | ICD-10-CM

## 2017-12-18 DIAGNOSIS — D62 Acute posthemorrhagic anemia: Secondary | ICD-10-CM

## 2017-12-18 DIAGNOSIS — C679 Malignant neoplasm of bladder, unspecified: Secondary | ICD-10-CM

## 2017-12-18 DIAGNOSIS — E119 Type 2 diabetes mellitus without complications: Secondary | ICD-10-CM

## 2017-12-18 DIAGNOSIS — K2970 Gastritis, unspecified, without bleeding: Secondary | ICD-10-CM

## 2017-12-18 DIAGNOSIS — K269 Duodenal ulcer, unspecified as acute or chronic, without hemorrhage or perforation: Secondary | ICD-10-CM

## 2017-12-18 HISTORY — PX: BIOPSY: SHX5522

## 2017-12-18 HISTORY — PX: ESOPHAGOGASTRODUODENOSCOPY: SHX5428

## 2017-12-18 LAB — GLUCOSE, CAPILLARY
Glucose-Capillary: 141 mg/dL — ABNORMAL HIGH (ref 65–99)
Glucose-Capillary: 135 mg/dL — ABNORMAL HIGH (ref 65–99)
Glucose-Capillary: 97 mg/dL (ref 65–99)
Glucose-Capillary: 97 mg/dL (ref 65–99)
Glucose-Capillary: 130 mg/dL — ABNORMAL HIGH (ref 65–99)

## 2017-12-18 LAB — BASIC METABOLIC PANEL
Anion gap: 8 (ref 5–15)
BUN: 44 mg/dL — ABNORMAL HIGH (ref 6–20)
CO2: 22 mmol/L (ref 22–32)
Calcium: 7.8 mg/dL — ABNORMAL LOW (ref 8.9–10.3)
Chloride: 111 mmol/L (ref 101–111)
Creatinine, Ser: 1.28 mg/dL — ABNORMAL HIGH (ref 0.61–1.24)
GFR calc Af Amer: 59 mL/min — ABNORMAL LOW (ref >60)
GFR calc non Af Amer: 51 mL/min — ABNORMAL LOW (ref >60)
Glucose, Bld: 146 mg/dL — ABNORMAL HIGH (ref 65–99)
Potassium: 4.3 mmol/L (ref 3.5–5.1)
Sodium: 141 mmol/L (ref 135–145)

## 2017-12-18 LAB — CBC WITH DIFFERENTIAL/PLATELET
Basophils Absolute: 0.1 10*3/uL (ref 0.0–0.1)
Basophils Relative: 1 %
Eosinophils Absolute: 0.1 10*3/uL (ref 0.0–0.7)
Eosinophils Relative: 1 %
HCT: 20.9 % — ABNORMAL LOW (ref 39.0–52.0)
Hemoglobin: 7 g/dL — ABNORMAL LOW (ref 13.0–17.0)
Lymphocytes Relative: 35 %
Lymphs Abs: 2.6 10*3/uL (ref 0.7–4.0)
MCH: 27.9 pg (ref 26.0–34.0)
MCHC: 33.5 g/dL (ref 30.0–36.0)
MCV: 83.3 fL (ref 78.0–100.0)
Monocytes Absolute: 0.3 10*3/uL (ref 0.1–1.0)
Monocytes Relative: 4 %
Neutro Abs: 4.3 10*3/uL (ref 1.7–7.7)
Neutrophils Relative %: 59 %
Platelets: 398 10*3/uL (ref 150–400)
RBC: 2.51 MIL/uL — ABNORMAL LOW (ref 4.22–5.81)
RDW: 16.9 % — ABNORMAL HIGH (ref 11.5–15.5)
WBC: 7.4 10*3/uL (ref 4.0–10.5)

## 2017-12-18 LAB — HEMOGLOBIN AND HEMATOCRIT, BLOOD
HCT: 28.3 % — ABNORMAL LOW (ref 39.0–52.0)
HCT: 30.5 % — ABNORMAL LOW (ref 39.0–52.0)
Hemoglobin: 9.6 g/dL — ABNORMAL LOW (ref 13.0–17.0)
Hemoglobin: 10.3 g/dL — ABNORMAL LOW (ref 13.0–17.0)

## 2017-12-18 LAB — URINE CULTURE

## 2017-12-18 LAB — PREPARE RBC (CROSSMATCH)

## 2017-12-18 SURGERY — EGD (ESOPHAGOGASTRODUODENOSCOPY)
Anesthesia: Monitor Anesthesia Care

## 2017-12-18 MED ORDER — PEG-KCL-NACL-NASULF-NA ASC-C 100 G PO SOLR
0.5000 | Freq: Once | ORAL | Status: AC
  Administered 2017-12-18: 100 g via ORAL
  Filled 2017-12-18: qty 1

## 2017-12-18 MED ORDER — SODIUM CHLORIDE 0.9 % IV SOLN
Freq: Once | INTRAVENOUS | Status: AC
  Administered 2017-12-18: 08:00:00 via INTRAVENOUS

## 2017-12-18 MED ORDER — PANTOPRAZOLE SODIUM 40 MG PO TBEC
40.0000 mg | DELAYED_RELEASE_TABLET | Freq: Two times a day (BID) | ORAL | Status: DC
  Administered 2017-12-18 – 2017-12-20 (×3): 40 mg via ORAL
  Filled 2017-12-18 (×3): qty 1

## 2017-12-18 MED ORDER — PEG-KCL-NACL-NASULF-NA ASC-C 100 G PO SOLR
0.5000 | Freq: Once | ORAL | Status: AC
  Administered 2017-12-19: 100 g via ORAL
  Filled 2017-12-18: qty 1

## 2017-12-18 MED ORDER — PROPOFOL 500 MG/50ML IV EMUL
INTRAVENOUS | Status: DC | PRN
  Administered 2017-12-18: 120 ug/kg/min via INTRAVENOUS

## 2017-12-18 MED ORDER — LIDOCAINE 2% (20 MG/ML) 5 ML SYRINGE
INTRAMUSCULAR | Status: DC | PRN
  Administered 2017-12-18: 100 mg via INTRAVENOUS

## 2017-12-18 MED ORDER — PEG-KCL-NACL-NASULF-NA ASC-C 100 G PO SOLR: 1.0000 | Freq: Once | ORAL | Status: DC

## 2017-12-18 MED ORDER — PROPOFOL 10 MG/ML IV BOLUS
INTRAVENOUS | Status: AC
  Filled 2017-12-18: qty 40

## 2017-12-18 MED ORDER — PROPOFOL 10 MG/ML IV BOLUS
INTRAVENOUS | Status: DC | PRN
  Administered 2017-12-18: 20 mg via INTRAVENOUS

## 2017-12-18 MED ORDER — PROPOFOL 10 MG/ML IV BOLUS
INTRAVENOUS | Status: AC
  Filled 2017-12-18: qty 80

## 2017-12-18 MED ORDER — LACTATED RINGERS IV SOLN
INTRAVENOUS | Status: DC
  Administered 2017-12-18: 16:00:00 via INTRAVENOUS

## 2017-12-18 NOTE — Interval H&P Note (Signed)
History and Physical Interval Note:  12/18/2017 4:20 PM  Shane [name]  has presented today for surgery, with the diagnosis of upper gastrointestinal bleeding  The various methods of treatment have been discussed with the patient and family. After consideration of risks, benefits and other options for treatment, the patient has consented to  Procedure(s): ESOPHAGOGASTRODUODENOSCOPY (EGD) (N/A) as a surgical intervention .  The patient's history has been reviewed, patient examined, no change in status, stable for surgery.  I have reviewed the patient's chart and labs.  Questions were answered to the patient's satisfaction.     Nelida Meuse III

## 2017-12-18 NOTE — Anesthesia Preprocedure Evaluation (Addendum)
Anesthesia Evaluation  Patient identified by MRN, date of birth, ID band Patient awake    Reviewed: Allergy & Precautions, NPO status , Patient's Chart, lab work & pertinent test results  Airway Mallampati: I  TM Distance: >3 FB Neck ROM: Full    Dental   Pulmonary former smoker,    Pulmonary exam normal        Cardiovascular hypertension, Pt. on medications + CAD and + Peripheral Vascular Disease  Normal cardiovascular exam     Neuro/Psych CVA    GI/Hepatic negative GI ROS, Neg liver ROS, PUD,   Endo/Other  diabetes, Type 1, Insulin Dependent  Renal/GU Renal disease     Musculoskeletal negative musculoskeletal ROS (+)   Abdominal   Peds  Hematology  (+) anemia ,   Anesthesia Other Findings - HLD  Reproductive/Obstetrics                            Lab Results  Component Value Date   WBC 7.4 12/18/2017   HGB 7.0 (L) 12/18/2017   HCT 20.9 (L) 12/18/2017   MCV 83.3 12/18/2017   PLT 398 12/18/2017   EKG: normal sinus rhythm.  Echo: - Left ventricle: The cavity size was normal. There was mild focal   basal hypertrophy of the septum. Systolic function was vigorous.   The estimated ejection fraction was in the range of 65% to 70%.   Wall motion was normal; there were no regional wall motion   abnormalities. There was an increased relative contribution of   atrial contraction to ventricular filling. Doppler parameters are   consistent with abnormal left ventricular relaxation (grade 1   diastolic dysfunction). - Aortic valve: Valve mobility was mildly restricted. Valve area   (VTI): 2 cm^2. Valve area (Vmax): 1.74 cm^2. Valve area (Vmean):   1.74 cm^2. - Mitral valve: Calcified annulus. - Atrial septum: There was increased thickness of the septum,   consistent with lipomatous hypertrophy. - Pulmonic valve: There was trivial regurgitation.  Anesthesia Physical Anesthesia Plan  ASA:  IV  Anesthesia Plan: MAC   Post-op Pain Management:    Induction: Intravenous  PONV Risk Score and Plan: 0 and Propofol infusion and Treatment may vary due to age or medical condition  Airway Management Planned: Nasal Cannula  Additional Equipment: None  Intra-op Plan:   Post-operative Plan:   Informed Consent:   Plan Discussed with: CRNA and Surgeon  Anesthesia Plan Comments: (Cancelled due to low Hgb in AM.  Hb now9.6 at 3pm)      Anesthesia Quick Evaluation

## 2017-12-18 NOTE — Anesthesia Postprocedure Evaluation (Signed)
Anesthesia Post Note  Patient: Shane [name]  Procedure(s) Performed: ESOPHAGOGASTRODUODENOSCOPY (EGD) (N/A ) BIOPSY     Patient location during evaluation: PACU Anesthesia Type: MAC Level of consciousness: awake and alert Pain management: pain level controlled Vital Signs Assessment: post-procedure vital signs reviewed and stable Respiratory status: spontaneous breathing, nonlabored ventilation, respiratory function stable and patient connected to nasal cannula oxygen Cardiovascular status: stable and blood pressure returned to baseline Postop Assessment: no apparent nausea or vomiting Anesthetic complications: no    Last Vitals:  Vitals:   12/18/17 1642 12/18/17 1650  BP: (!) 112/53 130/66  Pulse: 70 62  Resp: 16 (!) 27  Temp: 36.7 C   SpO2: 100% 94%    Last Pain:  Vitals:   12/18/17 1650  TempSrc:   PainSc: 0-No pain                 Khang Hannum DAVID

## 2017-12-18 NOTE — Progress Notes (Addendum)
Saw patient and spoke to nurse. Melena about 2 hrs ago, AM Hgb 7.0 after 3 units PRBCs  No chest pain or dyspnea.  Anxious-appearing but no respiratory distress. Abdomen soft, nontender.  Plan is for 2 more units PRBC transfusion and EGD later today.  Nursing tells me PRBCs already ordered by [facility] doc.

## 2017-12-18 NOTE — Telephone Encounter (Signed)
Per 6/6 no los 

## 2017-12-18 NOTE — Transfer of Care (Signed)
Immediate Anesthesia Transfer of Care Note  Patient: Shane [name]  Procedure(s) Performed: ESOPHAGOGASTRODUODENOSCOPY (EGD) (N/A ) BIOPSY  Patient Location: Endoscopy Unit  Anesthesia Type:MAC  Level of Consciousness: awake  Airway & Oxygen Therapy: Patient Spontanous Breathing and Patient connected to nasal cannula oxygen  Post-op Assessment: Report given to RN and Post -op Vital signs reviewed and stable  Post vital signs: Reviewed and stable  Last Vitals:  Vitals Value Taken Time  BP    Temp    Pulse    Resp 12 12/18/2017  4:40 PM  SpO2    Vitals shown include unvalidated device data.  Last Pain:  Vitals:   12/18/17 1549  TempSrc: Oral  PainSc: 0-No pain         Complications: No apparent anesthesia complications

## 2017-12-18 NOTE — Progress Notes (Signed)
Patient ID: Shane [name], male   DOB: 06-25-1936, 82 y.o.   MRN: [id number]  PROGRESS NOTE    Shane [name]  SEG:[id number] DOB: Apr 27, 1936 DOA: 12/17/2017 PCP: Tammi Sou, MD   Brief Narrative:  82 year old male with history of bladder cancer diagnosed in September 2017 status post TURBT on 04/02/2016, diabetes mitis, hyperlipidemia, stroke presented on 12/17/2017 with generalized weakness and dark stools.  Hemoglobin on presentation was 4.9.  He was started on IV Protonix, GI was consulted and blood transfusion was ordered.  Assessment & Plan:   Principal Problem:   GI bleed Active Problems:   Diabetes mellitus without complication (HCC)   UTI (urinary tract infection)   Malignant neoplasm of urinary bladder (HCC)   Acute blood loss anemia   Probable upper GI bleeding presenting with melena -Continue Protonix drip.  GI following.  For probable upper GI endoscopy today -Had melena overnight -Aspirin on hold  Acute blood loss anemia -Probably from GI bleeding -Status post 3 units transfusion and currently is getting fourth unit transfusion.  Hemoglobin this morning is 7.  Monitor H&H.  Bladder cancer status post TURBT on 04/02/2016 and chemotherapy completion in December 2017 and cystoprostatectomy with bilateral pelvic lymphadenectomy on 10/17/2016 -Outpatient follow-up with oncology/Dr. Alen Blew and urology/Dr. Tresa Moore  Doubt that patient has UTI -Patient does not have any urinary symptoms with no flank pain, no fever or leukocytosis.  Will discontinue Rocephin  Diabetes mellitus type II -Blood sugars controlled.  Continue Accu-Cheks with coverage  Hyperlipidemia -Continue statin  Recent history of ischemic stroke -No residual weakness -Aspirin on hold -Continue statin     DVT prophylaxis: SCDs Code Status: Full Family Communication: Daughter at bedside Disposition Plan: Home once cleared by GI  Consultants: GI  Procedures: None  Antimicrobials:  Rocephin from 12/17/2017 onwards   Subjective: Patient seen and examined at bedside.  He denies any overnight fever, nausea, vomiting or abdominal pain.  He had one episode of melena overnight.  Objective: Vitals:   12/18/17 0730 12/18/17 0802 12/18/17 1000 12/18/17 1021  BP: 120/74 116/68 126/77 129/72  Pulse: 69 72 70 68  Resp: 18 18 20 20   Temp: 98.5 F (36.9 C) 98.7 F (37.1 C) 99 F (37.2 C) 99.1 F (37.3 C)  TempSrc: Oral Oral Oral Oral  SpO2: 100% 97% 95% 97%  Weight:      Height:        Intake/Output Summary (Last 24 hours) at 12/18/2017 1049 Last data filed at 12/18/2017 1000 Gross per 24 hour  Intake 2580.5 ml  Output 800 ml  Net 1780.5 ml   Filed Weights   12/17/17 1515  Weight: 89.1 kg (196 lb 6.9 oz)    Examination:  General exam: Appears calm and comfortable  Respiratory system: Bilateral decreased breath sound at bases Cardiovascular system: S1 & S2 heard, rate controlled  gastrointestinal system: Abdomen is nondistended, soft and nontender. Normal bowel sounds heard. Extremities: No cyanosis, clubbing, edema   Data Reviewed: I have personally reviewed following labs and imaging studies  CBC: Recent Labs  Lab 12/17/17 0953 12/17/17 1127 12/18/17 0545  WBC 7.4 6.6 7.4  NEUTROABS 5.5 4.8 4.3  HGB 5.2* 4.9* 7.0*  HCT 17.0* 15.7* 20.9*  MCV 82.9 83.1 83.3  PLT 537* 545* 737   Basic Metabolic Panel: Recent Labs  Lab 12/17/17 0953 12/17/17 1127 12/18/17 0545  NA 139 141 141  K 4.7 4.5 4.3  CL 108 110 111  CO2 20* 22 22  GLUCOSE 192*  164* 146*  BUN 39* 42* 44*  CREATININE 1.28 1.26* 1.28*  CALCIUM 8.4 8.2* 7.8*   GFR: Estimated Creatinine Clearance: 49.9 mL/min (A) (by C-G formula based on SCr of 1.28 mg/dL (H)). Liver Function Tests: Recent Labs  Lab 12/17/17 0953 12/17/17 1127  AST 12 11*  ALT 10 12*  ALKPHOS 70 57  BILITOT <0.2* 0.2*  PROT 6.2* 6.2*  ALBUMIN 2.7* 2.6*   No results for input(s): LIPASE, AMYLASE in the last 168  hours. No results for input(s): AMMONIA in the last 168 hours. Coagulation Profile: Recent Labs  Lab 12/17/17 1127  INR 1.07   Cardiac Enzymes: No results for input(s): CKTOTAL, CKMB, CKMBINDEX, TROPONINI in the last 168 hours. BNP (last 3 results) No results for input(s): PROBNP in the last 8760 hours. HbA1C: No results for input(s): HGBA1C in the last 72 hours. CBG: Recent Labs  Lab 12/17/17 1647 12/17/17 2051 12/18/17 0729  GLUCAP 117* 156* 141*   Lipid Profile: No results for input(s): CHOL, HDL, LDLCALC, TRIG, CHOLHDL, LDLDIRECT in the last 72 hours. Thyroid Function Tests: No results for input(s): TSH, T4TOTAL, FREET4, T3FREE, THYROIDAB in the last 72 hours. Anemia Panel: No results for input(s): VITAMINB12, FOLATE, FERRITIN, TIBC, IRON, RETICCTPCT in the last 72 hours. Sepsis Labs: No results for input(s): PROCALCITON, LATICACIDVEN in the last 168 hours.  No results found for this or any previous visit (from the past 240 hour(s)).       Radiology Studies: No results found.      Scheduled Meds: . insulin aspart  0-5 Units Subcutaneous QHS  . insulin aspart  0-9 Units Subcutaneous TID WC  . pravastatin  20 mg Oral q1800   Continuous Infusions: . sodium chloride    . sodium chloride 75 mL/hr at 12/18/17 0552  . pantoprozole (PROTONIX) infusion 8 mg/hr (12/18/17 0104)     LOS: 1 day        Aline August, MD [facility] Pager [phone]  If 7PM-7AM, please contact night-coverage [email or web address] Password [facility] 12/18/2017, 10:49 AM

## 2017-12-18 NOTE — Anesthesia Procedure Notes (Signed)
Date/Time: 12/18/2017 4:23 PM Performed by: Sharlette Dense, CRNA Oxygen Delivery Method: Nasal cannula

## 2017-12-18 NOTE — H&P (View-Only) (Signed)
Saw patient and spoke to nurse. Melena about 2 hrs ago, AM Hgb 7.0 after 3 units PRBCs  No chest pain or dyspnea.  Anxious-appearing but no respiratory distress. Abdomen soft, nontender.  Plan is for 2 more units PRBC transfusion and EGD later today.  Nursing tells me PRBCs already ordered by [facility] doc.

## 2017-12-18 NOTE — Op Note (Signed)
[facility] Patient Name: Shane [name] Procedure Date: 12/18/2017 MRN: [id number] Attending MD: Estill Cotta. Loletha Carrow , MD Date of Birth: 11-Oct-1935 CSN: [id number] Age: 82 Admit Type: Inpatient Procedure:                Upper GI endoscopy Indications:              Acute post hemorrhagic anemia, Melena Providers:                Mallie Mussel L. Loletha Carrow, MD, Presley Raddle, RN, Charolette Child, Technician Referring MD:              Medicines:                Monitored Anesthesia Care Complications:            No immediate complications. Estimated Blood Loss:     Estimated blood loss was minimal. Procedure:                Pre-Anesthesia Assessment:                           - Prior to the procedure, a History and Physical                            was performed, and patient medications and                            allergies were reviewed. The patient's tolerance of                            previous anesthesia was also reviewed. The risks                            and benefits of the procedure and the sedation                            options and risks were discussed with the patient.                            All questions were answered, and informed consent                            was obtained. Prior Anticoagulants: The patient has                            taken aspirin, last dose was 1 day prior to                            procedure. ASA Grade Assessment: III - A patient                            with severe systemic disease. After reviewing the  risks and benefits, the patient was deemed in                            satisfactory condition to undergo the procedure.                           After obtaining informed consent, the endoscope was                            passed under direct vision. Throughout the                            procedure, the patient's blood pressure, pulse, and   oxygen saturations were monitored continuously. The                            [id number]) scope was introduced through the                            mouth, and advanced to the second part of duodenum.                            The upper GI endoscopy was accomplished without                            difficulty. The patient tolerated the procedure                            well. Scope In: Scope Out: Findings:      The esophagus was normal.      The stomach was normal. Biopsies were taken with a cold forceps for       histology. (Sidney protocol).      The cardia and gastric fundus were normal on retroflexion.      One non-bleeding superficial duodenal ulcer with no stigmata of bleeding       was found in the second portion of the duodenum. The lesion was 6 mm in       largest dimension. There was an adjacent shallow linear ulcer and a       diminutive linear ulcer in the duodenal sweep. There was clear bile and       no fresh or old blood in the UGI tract. Impression:               - Normal esophagus.                           - Normal stomach. Biopsied.                           - One non-bleeding duodenal ulcer with no stigmata                            of bleeding. Two additional smaller duodenal ,                            clean-based ulcers.  It is not clear that these ulcers are the source of                            brisk GI bleeding causing severe acute blood loss                            anemia.                           The patient is now passing burgundy stool,                            suggesting a lower GI source. Moderate Sedation:      MAC sedation used Recommendation:           - Return patient to hospital ward for ongoing care.                           - Perform a colonoscopy tomorrow. Procedure Code(s):        --- Professional ---                           [phone], Esophagogastroduodenoscopy, flexible,                             transoral; with biopsy, single or multiple Diagnosis Code(s):        --- Professional ---                           K26.9, Duodenal ulcer, unspecified as acute or                            chronic, without hemorrhage or perforation                           D62, Acute posthemorrhagic anemia                           K92.1, Melena (includes Hematochezia) CPT copyright 2017 [employer]. All rights reserved. The codes documented in this report are preliminary and upon coder review may  be revised to meet current compliance requirements. Esteven Overfelt L. Loletha Carrow, MD 12/18/2017 4:52:13 PM This report has been signed electronically. Number of Addenda: 0

## 2017-12-19 ENCOUNTER — Encounter (HOSPITAL_COMMUNITY)
Admission: EM | Disposition: A | Discharge status: Home / Self Care | Payer: Self-pay | Source: Home / Self Care | Attending: Internal Medicine

## 2017-12-19 ENCOUNTER — Encounter (HOSPITAL_COMMUNITY): Payer: Self-pay

## 2017-12-19 ENCOUNTER — Inpatient Hospital Stay (HOSPITAL_COMMUNITY): Payer: Medicare Other

## 2017-12-19 DIAGNOSIS — K922 Gastrointestinal hemorrhage, unspecified: Secondary | ICD-10-CM

## 2017-12-19 DIAGNOSIS — Q438 Other specified congenital malformations of intestine: Secondary | ICD-10-CM

## 2017-12-19 HISTORY — PX: COLONOSCOPY: SHX5424

## 2017-12-19 LAB — BASIC METABOLIC PANEL
Anion gap: 10 (ref 5–15)
BUN: 32 mg/dL — ABNORMAL HIGH (ref 6–20)
CO2: 21 mmol/L — ABNORMAL LOW (ref 22–32)
Calcium: 8.1 mg/dL — ABNORMAL LOW (ref 8.9–10.3)
Chloride: 114 mmol/L — ABNORMAL HIGH (ref 101–111)
Creatinine, Ser: 1.25 mg/dL — ABNORMAL HIGH (ref 0.61–1.24)
GFR calc Af Amer: >60 mL/min (ref >60)
GFR calc non Af Amer: 53 mL/min — ABNORMAL LOW (ref >60)
Glucose, Bld: 136 mg/dL — ABNORMAL HIGH (ref 65–99)
Potassium: 3.8 mmol/L (ref 3.5–5.1)
Sodium: 145 mmol/L (ref 135–145)

## 2017-12-19 LAB — BPAM RBC
Blood Product Expiration Date: 201906252359
Blood Product Expiration Date: 201906262359
Blood Product Expiration Date: 201906292359
Blood Product Expiration Date: 201906292359
Blood Product Expiration Date: 201906292359
ISSUE DATE / TIME: 201906061232
ISSUE DATE / TIME: 201906061547
ISSUE DATE / TIME: 201906061923
ISSUE DATE / TIME: 201906070742
ISSUE DATE / TIME: 201906071003
Unit Type and Rh: 6200
Unit Type and Rh: 6200
Unit Type and Rh: 6200
Unit Type and Rh: 6200
Unit Type and Rh: 6200

## 2017-12-19 LAB — TYPE AND SCREEN
ABO/RH(D): A POS
Antibody Screen: NEGATIVE
Unit division: 0
Unit division: 0
Unit division: 0
Unit division: 0
Unit division: 0

## 2017-12-19 LAB — GLUCOSE, CAPILLARY
Glucose-Capillary: 122 mg/dL — ABNORMAL HIGH (ref 65–99)
Glucose-Capillary: 122 mg/dL — ABNORMAL HIGH (ref 65–99)
Glucose-Capillary: 219 mg/dL — ABNORMAL HIGH (ref 65–99)
Glucose-Capillary: 212 mg/dL — ABNORMAL HIGH (ref 65–99)
Glucose-Capillary: 132 mg/dL — ABNORMAL HIGH (ref 65–99)

## 2017-12-19 LAB — MAGNESIUM: Magnesium: 1.8 mg/dL (ref 1.7–2.4)

## 2017-12-19 LAB — HEMOGLOBIN AND HEMATOCRIT, BLOOD
HCT: 28.5 % — ABNORMAL LOW (ref 39.0–52.0)
Hemoglobin: 9.4 g/dL — ABNORMAL LOW (ref 13.0–17.0)

## 2017-12-19 SURGERY — COLONOSCOPY
Anesthesia: Moderate Sedation

## 2017-12-19 MED ORDER — IOPAMIDOL (ISOVUE-300) INJECTION 61%
100.0000 mL | Freq: Once | INTRAVENOUS | Status: AC | PRN
  Administered 2017-12-19: 100 mL via INTRAVENOUS

## 2017-12-19 MED ORDER — IOPAMIDOL (ISOVUE-300) INJECTION 61%
INTRAVENOUS | Status: AC
  Filled 2017-12-19: qty 100

## 2017-12-19 MED ORDER — FENTANYL CITRATE (PF) 100 MCG/2ML IJ SOLN
INTRAMUSCULAR | Status: DC | PRN
  Administered 2017-12-19 (×2): 25 ug via INTRAVENOUS

## 2017-12-19 MED ORDER — MIDAZOLAM HCL 5 MG/5ML IJ SOLN
INTRAMUSCULAR | Status: DC | PRN
  Administered 2017-12-19: 2 mg via INTRAVENOUS
  Administered 2017-12-19 (×2): 1 mg via INTRAVENOUS

## 2017-12-19 MED ORDER — SODIUM CHLORIDE 0.9 % IV SOLN
INTRAVENOUS | Status: DC
  Administered 2017-12-19: 08:00:00 via INTRAVENOUS

## 2017-12-19 MED ORDER — FENTANYL CITRATE (PF) 100 MCG/2ML IJ SOLN
INTRAMUSCULAR | Status: AC
  Filled 2017-12-19: qty 2

## 2017-12-19 MED ORDER — MIDAZOLAM HCL 5 MG/ML IJ SOLN
INTRAMUSCULAR | Status: AC
  Filled 2017-12-19: qty 2

## 2017-12-19 NOTE — Progress Notes (Signed)
Patient ID: Shane [name], male   DOB: 1935/11/27, 82 y.o.   MRN: [id number]  PROGRESS NOTE    Shane [name]  JXB:[id number] DOB: [birth year] DOA: 12/17/2017 PCP: Tammi Sou, MD   Brief Narrative:  82 year old male with history of bladder cancer diagnosed in September 2017 status post TURBT on 04/02/2016, diabetes mitis, hyperlipidemia, stroke presented on 12/17/2017 with generalized weakness and dark stools.  Hemoglobin on presentation was 4.9.  He was started on IV Protonix, GI was consulted and blood transfusion was ordered.  Assessment & Plan:   Principal Problem:   GI bleed Active Problems:   Diabetes mellitus without complication (HCC)   UTI (urinary tract infection)   Malignant neoplasm of urinary bladder (HCC)   Acute blood loss anemia   Melena   Duodenal ulcer   GI bleeding  -GI following.  Protonix drip has been switched to oral Protonix twice daily by GI.  Status post upper GI endoscopy on 12/18/2017 which showed  nonbleeding duodenal ulcers -Status post colonoscopy today which showed blood in cecum with unclear source.  GI recommends CT scan of the abdomen and pelvis which has been ordered. -Aspirin on hold  Acute blood loss anemia -Probably from GI bleeding -Status post 5 units transfusion since admission -Hemoglobin is 9.4 today.  No further indication for transfusion today.  Monitor H&H  Bladder cancer status post TURBT on 04/02/2016 and chemotherapy completion in December 2017 and cystoprostatectomy with bilateral pelvic lymphadenectomy on 10/17/2016 -Outpatient follow-up with oncology/Dr. Alen Blew and urology/Dr. Tresa Moore  Doubt that patient has UTI -Rocephin has already been discontinued  Diabetes mellitus type II -Blood sugars controlled.  Continue Accu-Cheks with coverage  Hyperlipidemia -Continue statin  Recent history of ischemic stroke -No residual weakness -Aspirin on hold -Continue statin     DVT prophylaxis: SCDs Code Status:  Full Family Communication: Daughter at bedside Disposition Plan: Home once cleared by GI  Consultants: GI  Procedures:  EGD on 12/18/2017 Findings:      The esophagus was normal.      The stomach was normal. Biopsies were taken with a cold forceps for       histology. (Sidney protocol).      The cardia and gastric fundus were normal on retroflexion.      One non-bleeding superficial duodenal ulcer with no stigmata of bleeding       was found in the second portion of the duodenum. The lesion was 6 mm in       largest dimension. There was an adjacent shallow linear ulcer and a       diminutive linear ulcer in the duodenal sweep. There was clear bile and       no fresh or old blood in the UGI tract. Impression:               - Normal esophagus.                           - Normal stomach. Biopsied.                           - One non-bleeding duodenal ulcer with no stigmata                            of bleeding. Two additional smaller duodenal ,  clean-based ulcers.                           It is not clear that these ulcers are the source of                            brisk GI bleeding causing severe acute blood loss                            anemia.                           The patient is now passing burgundy stool,                            suggesting a lower GI source. Moderate Sedation:      MAC sedation used Recommendation:           - Return patient to hospital ward for ongoing care.                           - Perform a colonoscopy tomorrow.   Colonoscopy on 12/19/2017 Findings:      The perianal and digital rectal examinations were normal.      The terminal ileum appeared normal.      The sigmoid colon was significantly tortuous, most likely due to prior       surgery.      A small amount of clotted blood was found in the cecum. It was cleared       away, revealing no underlying lesions.      Normal mucosa was found in the entire colon.       There is no endoscopic evidence of bleeding, diverticula, inflammation,       mass or ulcerations in the entire colon.      The exam was otherwise without abnormality on direct and retroflexion       views. No active bleeding or old blood was found in the TI or the colon,       except for the small clotted blood in cecum noted above. Impression:               - Preparation of the colon was fair. Extensive                            lavage performed.                           - The examined portion of the ileum was normal.                           - Tortuous colon.                           - Blood in the cecum.                           - Normal mucosa in the entire examined colon.                           -  The examination was otherwise normal on direct                            and retroflexion views.                           - No specimens collected.                           Source of bleeding unclear. Duodenal ulcers seen                            6/7 were not clearly the source, and the initial                            melena changed to hematochezia. Moderate Sedation:      Moderate (conscious) sedation was administered by the endoscopy nurse       and supervised by the endoscopist. The following parameters were       monitored: oxygen saturation, heart rate, blood pressure, respiratory       rate, EKG, adequacy of pulmonary ventilation, and response to care.       Total physician intraservice time was 42 minutes. Recommendation:           - Return patient to hospital ward for ongoing care.                           - Resume regular diet.                           - Continue present medications.                           - Perform a CT scan (computed tomography) of                            abdomen with contrast and pelvis. Patient had a                            radical cystoprostatectomy with ileal conduit,                            possibly precluding small bowel video  capsule study.                           CBC tomorrow AM, sooner if recurrent bleeding.    Antimicrobials: Rocephin from 12/17/2017-12/18/2017   Subjective: Patient seen and examined at bedside.  Overnight fever, nausea, vomiting or bloody stools. Objective: Vitals:   12/19/17 0902 12/19/17 0905 12/19/17 0910 12/19/17 0920  BP: 136/74 136/74 124/74 135/69  Pulse: 67 68 64 71  Resp: _0 Temp: 98 F (36.7 C)     TempSrc: Oral     SpO2: 100% 100% 100% 96%  Weight:      Height:        Intake/Output Summary (Last 24 hours) at 12/19/2017 1120 Last data filed at 12/19/2017 0921 Gross per 24 hour  Intake  1404.17 ml  Output 3602 ml  Net -2197.83 ml   Filed Weights   12/17/17 1515 12/18/17 1549  Weight: 89.1 kg (196 lb 6.9 oz) 89.1 kg (196 lb 6.9 oz)    Examination:  General exam: Appears calm and comfortable.  No distress Respiratory system: Bilateral decreased breath sound at bases, no crackles Cardiovascular system: S1 & S2 heard, rate controlled  gastrointestinal system: Abdomen is nondistended, soft and nontender. Normal bowel sounds heard. Extremities: No cyanosis, edema  Data Reviewed: I have personally reviewed following labs and imaging studies  CBC: Recent Labs  Lab 12/17/17 0953 12/17/17 1127 12/18/17 0545 12/18/17 1526 12/18/17 2008 12/19/17 0609  WBC 7.4 6.6 7.4  --   --   --   NEUTROABS 5.5 4.8 4.3  --   --   --   HGB 5.2* 4.9* 7.0* 9.6* 10.3* 9.4*  HCT 17.0* 15.7* 20.9* 28.3* 30.5* 28.5*  MCV 82.9 83.1 83.3  --   --   --   PLT 537* 545* 398  --   --   --    Basic Metabolic Panel: Recent Labs  Lab 12/17/17 0953 12/17/17 1127 12/18/17 0545 12/19/17 0609  NA 139 141 141 145  K 4.7 4.5 4.3 3.8  CL 108 110 111 114*  CO2 20* 22 22 21*  GLUCOSE 192* 164* 146* 136*  BUN 39* 42* 44* 32*  CREATININE 1.28 1.26* 1.28* 1.25*  CALCIUM 8.4 8.2* 7.8* 8.1*  MG  --   --   --  1.8   GFR: Estimated Creatinine Clearance: 51.1 mL/min (A) (by C-G formula  based on SCr of 1.25 mg/dL (H)). Liver Function Tests: Recent Labs  Lab 12/17/17 0953 12/17/17 1127  AST 12 11*  ALT 10 12*  ALKPHOS 70 57  BILITOT <0.2* 0.2*  PROT 6.2* 6.2*  ALBUMIN 2.7* 2.6*   No results for input(s): LIPASE, AMYLASE in the last 168 hours. No results for input(s): AMMONIA in the last 168 hours. Coagulation Profile: Recent Labs  Lab 12/17/17 1127  INR 1.07   Cardiac Enzymes: No results for input(s): CKTOTAL, CKMB, CKMBINDEX, TROPONINI in the last 168 hours. BNP (last 3 results) No results for input(s): PROBNP in the last 8760 hours. HbA1C: No results for input(s): HGBA1C in the last 72 hours. CBG: Recent Labs  Lab 12/18/17 1557 12/18/17 1718 12/18/17 2142 12/19/17 0756 12/19/17 0941  GLUCAP 97 97 130* 122* 122*   Lipid Profile: No results for input(s): CHOL, HDL, LDLCALC, TRIG, CHOLHDL, LDLDIRECT in the last 72 hours. Thyroid Function Tests: No results for input(s): TSH, T4TOTAL, FREET4, T3FREE, THYROIDAB in the last 72 hours. Anemia Panel: No results for input(s): VITAMINB12, FOLATE, FERRITIN, TIBC, IRON, RETICCTPCT in the last 72 hours. Sepsis Labs: No results for input(s): PROCALCITON, LATICACIDVEN in the last 168 hours.  Recent Results (from the past 240 hour(s))  Urine culture     Status: Abnormal   Collection Time: 12/17/17 12:19 PM  Result Value Ref Range Status   Specimen Description   Final    URINE, RANDOM Performed at [facility] [address] [place], [place] [address]    Special Requests   Final    NONE Performed at [facility], [place] [address], [place], [place] [address]    Culture MULTIPLE SPECIES PRESENT, SUGGEST RECOLLECTION (A)  Final   Report Status 12/18/2017 FINAL  Final         Radiology Studies: No results found.      Scheduled  Meds: . insulin aspart  0-5 Units Subcutaneous QHS  . insulin aspart  0-9 Units Subcutaneous TID WC  . iopamidol      . pantoprazole   40 mg Oral BID AC  . pravastatin  20 mg Oral q1800   Continuous Infusions:    LOS: 2 days        Aline August, MD [facility] Pager [phone]  If 7PM-7AM, please contact night-coverage [email or web address] Password [email or web address] 12/19/2017, 11:20 AM

## 2017-12-19 NOTE — Interval H&P Note (Signed)
History and Physical Interval Note:  12/19/2017 8:16 AM  Shane [name]  has presented today for surgery, with the diagnosis of Gastrointestinal bleeding  The various methods of treatment have been discussed with the patient and family. After consideration of risks, benefits and other options for treatment, the patient has consented to  Procedure(s): COLONOSCOPY (N/A) as a surgical intervention .  The patient's history has been reviewed, patient examined, no change in status, stable for surgery.  I have reviewed the patient's chart and labs.  Questions were answered to the patient's satisfaction.     Nelida Meuse III

## 2017-12-19 NOTE — Op Note (Signed)
[facility] Patient Name: Shane [name] Procedure Date: 12/19/2017 MRN: [id number] Attending MD: Estill Cotta. Loletha Carrow , MD Date of Birth: 31-Dec-1935 CSN: [id number] Age: 82 Admit Type: Inpatient Procedure:                Colonoscopy Indications:              Hematochezia, Acute post hemorrhagic anemia Providers:                Mallie Mussel L. Loletha Carrow, MD, Burtis Junes, RN, Laurena Spies, Technician Referring MD:              Medicines:                Fentanyl 50 micrograms IV, Midazolam 4 mg IV Complications:            No immediate complications. Estimated Blood Loss:     Estimated blood loss: none. Procedure:                Pre-Anesthesia Assessment:                           - Prior to the procedure, a History and Physical                            was performed, and patient medications and                            allergies were reviewed. The patient's tolerance of                            previous anesthesia was also reviewed. The risks                            and benefits of the procedure and the sedation                            options and risks were discussed with the patient.                            All questions were answered, and informed consent                            was obtained. Prior Anticoagulants: The patient has                            taken aspirin, last dose was 3 days prior to                            procedure. ASA Grade Assessment: III - A patient                            with severe systemic disease. After reviewing the  risks and benefits, the patient was deemed in                            satisfactory condition to undergo the procedure.                           After obtaining informed consent, the colonoscope                            was passed under direct vision. Throughout the                            procedure, the patient's blood pressure, pulse, and          oxygen saturations were monitored continuously. The                            EC-[id number] ([id number]) scope was introduced through                            the anus and advanced to the the terminal ileum,                            with identification of the appendiceal orifice and                            IC valve. The colonoscopy was performed with                            difficulty due to a tortuous colon. Successful                            completion of the procedure was aided by using                            manual pressure. The patient tolerated the                            procedure well. The quality of the bowel                            preparation was initially fair, then improved with                            extensive lavage. The terminal ileum, ileocecal                            valve, appendiceal orifice, and rectum were                            photographed. The bowel preparation used was                            MoviPrep. Scope In: 8:23:04 AM Scope Out: 9:00:27 AM Scope Withdrawal  Time: 0 hours 20 minutes 48 seconds  Total Procedure Duration: 0 hours 37 minutes 23 seconds  Findings:      The perianal and digital rectal examinations were normal.      The terminal ileum appeared normal.      The sigmoid colon was significantly tortuous, most likely due to prior       surgery.      A small amount of clotted blood was found in the cecum. It was cleared       away, revealing no underlying lesions.      Normal mucosa was found in the entire colon.      There is no endoscopic evidence of bleeding, diverticula, inflammation,       mass or ulcerations in the entire colon.      The exam was otherwise without abnormality on direct and retroflexion       views. No active bleeding or old blood was found in the TI or the colon,       except for the small clotted blood in cecum noted above. Impression:               - Preparation of the colon was fair.  Extensive                            lavage performed.                           - The examined portion of the ileum was normal.                           - Tortuous colon.                           - Blood in the cecum.                           - Normal mucosa in the entire examined colon.                           - The examination was otherwise normal on direct                            and retroflexion views.                           - No specimens collected.                           Source of bleeding unclear. Duodenal ulcers seen                            6/7 were not clearly the source, and the initial                            melena changed to hematochezia. Moderate Sedation:      Moderate (conscious) sedation was administered by the endoscopy nurse       and supervised by the endoscopist. The following parameters were  monitored: oxygen saturation, heart rate, blood pressure, respiratory       rate, EKG, adequacy of pulmonary ventilation, and response to care.       Total physician intraservice time was 42 minutes. Recommendation:           - Return patient to hospital ward for ongoing care.                           - Resume regular diet.                           - Continue present medications.                           - Perform a CT scan (computed tomography) of                            abdomen with contrast and pelvis. Patient had a                            radical cystoprostatectomy with ileal conduit,                            possibly precluding small bowel video capsule study.                           CBC tomorrow AM, sooner if recurrent bleeding. Procedure Code(s):        --- Professional ---                           [phone], Colonoscopy, flexible; diagnostic, including                            collection of specimen(s) by brushing or washing,                            when performed (separate procedure)                           99152, 59,  Moderate sedation services provided by                            the same physician or other qualified health care                            professional performing the diagnostic or                            therapeutic service that the sedation supports,                            requiring the presence of an independent trained                            observer to assist in the monitoring of the  patient's level of consciousness and physiological                            status; initial 15 minutes of intraservice time,                            patient age 78 years or older                           [phone], Moderate sedation services provided by the                            same physician or other qualified health care                            professional performing the diagnostic or                            therapeutic service that the sedation supports,                            requiring the presence of an independent trained                            observer to assist in the monitoring of the                            patient's level of consciousness and physiological                            status; each additional 15 minutes intraservice                            time (List separately in addition to code for                            primary service)                           [phone], Moderate sedation services provided by the                            same physician or other qualified health care                            professional performing the diagnostic or                            therapeutic service that the sedation supports,                            requiring the presence of an independent trained                            observer to assist  in the monitoring of the                            patient's level of consciousness and physiological                            status; each additional 15 minutes intraservice                             time (List separately in addition to code for                            primary service) Diagnosis Code(s):        --- Professional ---                           K92.2, Gastrointestinal hemorrhage, unspecified                           K92.1, Melena (includes Hematochezia)                           D62, Acute posthemorrhagic anemia                           Q43.8, Other specified congenital malformations of                            intestine CPT copyright 2017 [employer]. All rights reserved. The codes documented in this report are preliminary and upon coder review may  be revised to meet current compliance requirements. Henry L. Loletha Carrow, MD 12/19/2017 9:32:28 AM This report has been signed electronically. Number of Addenda: 0

## 2017-12-20 LAB — CBC
HCT: 26.6 % — ABNORMAL LOW (ref 39.0–52.0)
Hemoglobin: 8.7 g/dL — ABNORMAL LOW (ref 13.0–17.0)
MCH: 28.2 pg (ref 26.0–34.0)
MCHC: 32.7 g/dL (ref 30.0–36.0)
MCV: 86.1 fL (ref 78.0–100.0)
Platelets: 368 10*3/uL (ref 150–400)
RBC: 3.09 MIL/uL — ABNORMAL LOW (ref 4.22–5.81)
RDW: 16.8 % — ABNORMAL HIGH (ref 11.5–15.5)
WBC: 6.6 10*3/uL (ref 4.0–10.5)

## 2017-12-20 LAB — MAGNESIUM: Magnesium: 1.8 mg/dL (ref 1.7–2.4)

## 2017-12-20 LAB — GLUCOSE, CAPILLARY
Glucose-Capillary: 114 mg/dL — ABNORMAL HIGH (ref 65–99)
Glucose-Capillary: 178 mg/dL — ABNORMAL HIGH (ref 65–99)

## 2017-12-20 LAB — BASIC METABOLIC PANEL
Anion gap: 4 — ABNORMAL LOW (ref 5–15)
BUN: 19 mg/dL (ref 6–20)
CO2: 27 mmol/L (ref 22–32)
Calcium: 8.2 mg/dL — ABNORMAL LOW (ref 8.9–10.3)
Chloride: 110 mmol/L (ref 101–111)
Creatinine, Ser: 1.2 mg/dL (ref 0.61–1.24)
GFR calc Af Amer: >60 mL/min (ref >60)
GFR calc non Af Amer: 55 mL/min — ABNORMAL LOW (ref >60)
Glucose, Bld: 121 mg/dL — ABNORMAL HIGH (ref 65–99)
Potassium: 4.4 mmol/L (ref 3.5–5.1)
Sodium: 141 mmol/L (ref 135–145)

## 2017-12-20 MED ORDER — PANTOPRAZOLE SODIUM 40 MG PO TBEC: 40.0000 mg | DELAYED_RELEASE_TABLET | Freq: Two times a day (BID) | ORAL | 0 refills | Status: DC

## 2017-12-20 NOTE — Progress Notes (Signed)
Discharge instructions and medications discussed with patient and wife. AVS given to patient.  All questions answered.

## 2017-12-20 NOTE — Progress Notes (Signed)
Reviewed CT result. Shane [name] is reportedly stable with no more overt bleeding. OK for discharge from GI perspective, no aspirin, twice daily PPI, and I will arrange follow up. [employer].

## 2017-12-20 NOTE — Plan of Care (Signed)
  Problem: Elimination: Goal: Will not experience complications related to urinary retention Outcome: Progressing   Problem: Elimination: Goal: Will not experience complications related to bowel motility Outcome: Progressing   

## 2017-12-20 NOTE — Discharge Summary (Signed)
Physician Discharge Summary  Jearld Hemp AXE:[id number] DOB: [birth year] DOA: 12/17/2017  PCP: Tammi Sou, MD  Admit date: 12/17/2017 Discharge date: 12/20/2017  Admitted From: Home Disposition: Home  Recommendations for Outpatient Follow-up:  1. Follow up with PCP in 1 week with repeat CBC/BMP 2. Follow-up with gastroenterology as an outpatient 3. Outpatient follow-up with urology/Dr. Tresa Moore and oncology/Dr. Hanover: No Equipment/Devices: None  Discharge Condition: Stable CODE STATUS: Full Diet recommendation: Heart Healthy / Carb Modified   Brief/Interim Summary: 82 year old male with history of bladder cancer diagnosed in September 2017 status post TURBT on 04/02/2016, diabetes mitis, hyperlipidemia, stroke presented on 12/17/2017 with generalized weakness and dark stools.  Hemoglobin on presentation was 4.9.  He was started on IV Protonix, GI was consulted and blood transfusion was ordered.  Patient received a total of 5 units of packed red cells during hospitalization.  He had upper GI endoscopy and colonoscopy.  His hemoglobin is stable this morning.  He is tolerating diet.  If cleared by GI, patient will be discharged home with outpatient follow-up with GI.  Aspirin will remain on hold until evaluation by primary care provider.    Discharge Diagnoses:  Principal Problem:   GI bleed Active Problems:   Diabetes mellitus without complication (HCC)   UTI (urinary tract infection)   Malignant neoplasm of urinary bladder (HCC)   Acute blood loss anemia   Melena   Duodenal ulcer  GI bleeding  -Initially started on Protonix drip which was changed to oral Protonix twice daily by GI.   Continue Protonix on discharge.  Status post upper GI endoscopy on 12/18/2017 which showed  nonbleeding duodenal ulcers -Status post colonoscopy on 12/19/2017 which showed blood in cecum with unclear source.    CT of the abdomen and pelvis recommended by GI did not show any acute  abnormality -Aspirin on hold, follow-up with primary care provider in a week with repeat hemoglobin.  If cleared by GI, patient will be discharged home.  No further episodes of black or bloody stools.  Tolerating diet.  Outpatient follow-up with GI  Acute blood loss anemia -Probably from GI bleeding -Status post 5 units transfusion since admission -Hemoglobin is 8.7 today.  No further indication for transfusion today.    Patient follow-up of BMP  Bladder cancer status post TURBT on 04/02/2016 and chemotherapy completion in December 2017 and cystoprostatectomy with bilateral pelvic lymphadenectomy on 10/17/2016 -Outpatient follow-up with oncology/Dr. Alen Blew and urology/Dr. Tresa Moore  Doubt that patient has UTI -Rocephin has already been discontinued  Diabetes mellitus type II -Blood sugars controlled.  Outpatient follow-up  Hyperlipidemia -Continue statin  Recent history of ischemic stroke -No residual weakness -Aspirin on hold till evaluation with primary care provider -Continue statin     Discharge Instructions  Discharge Instructions    Ambulatory referral to Gastroenterology   Complete by:  As directed    Follow up for GI bleed   What is the reason for referral?:  Other   Call MD for:  difficulty breathing, headache or visual disturbances   Complete by:  As directed    Call MD for:  extreme fatigue   Complete by:  As directed    Call MD for:  hives   Complete by:  As directed    Call MD for:  persistant dizziness or light-headedness   Complete by:  As directed    Call MD for:  persistant nausea and vomiting   Complete by:  As directed  Call MD for:  severe uncontrolled pain   Complete by:  As directed    Call MD for:  temperature >100.4   Complete by:  As directed    Diet - low sodium heart healthy   Complete by:  As directed    Diet Carb Modified   Complete by:  As directed    Increase activity slowly   Complete by:  As directed      Allergies as of  12/20/2017      Reactions   Other Swelling   Pt states had swelling of the eye when having eye surgery and the anesthetic placed in eye       Medication List    STOP taking these medications   aspirin EC 81 MG tablet   polyethylene glycol packet Commonly known as:  MIRALAX / GLYCOLAX     TAKE these medications   FREESTYLE LIBRE 14 DAY SENSOR Misc 1 each by Does not apply route daily.   insulin aspart 100 UNIT/ML FlexPen Commonly known as:  NOVOLOG FLEXPEN CBG < 70: implement hypoglycemia protocol CBG 70 - 120: 3 units CBG 121 - 150: 5 units CBG 151 - 200: 6 units CBG 201 - 250: 8 units CBG 251 - 300: 11 units CBG 301 - 350: 14 units CBG 351 - 400: 18 units CBG > 400: call MD   insulin glargine 100 unit/mL Sopn Commonly known as:  LANTUS Inject 0.09 mLs (9 Units total) into the skin daily.   pantoprazole 40 MG tablet Commonly known as:  PROTONIX Take 1 tablet (40 mg total) by mouth 2 (two) times daily before a meal.   Pen Needles 32G X 4 MM Misc 1 each by Does not apply route daily.   Insulin Pen Needle 31G X 5 MM Misc Inject insulin via insulin pen 3 x daily with meals   pravastatin 20 MG tablet Commonly known as:  PRAVACHOL TAKE 1 TABLET BY MOUTH EVERY DAY   terbinafine 250 MG tablet Commonly known as:  LAMISIL 1 tab po qd x 90 days      Follow-up Information    McGowen, [name], MD. Schedule an appointment as soon as possible for a visit in 1 week(s).   Specialty:  Family Medicine Why:  With repeat CBC/BMP Contact information: [address] [place] [address] [phone]        Doran Stabler, MD. Schedule an appointment as soon as possible for a visit in 1 week(s).   Specialty:  Gastroenterology Contact information: [place] [address] [place] [address] [phone]          Allergies  Allergen Reactions  . Other Swelling    Pt states had swelling of the eye when having eye surgery and the anesthetic placed in eye      Consultations:  GI   Procedures/Studies: Ct Abdomen Pelvis W Contrast  Result Date: 12/19/2017 CLINICAL DATA:  Inpatient. GI bleed. History of invasive bladder cancer status post cystoprostatectomy with ileal conduit urinary diversion in 2017. EXAM: CT ABDOMEN AND PELVIS WITH CONTRAST TECHNIQUE: Multidetector CT imaging of the abdomen and pelvis was performed using the standard protocol following bolus administration of intravenous contrast. CONTRAST:  159m ISOVUE-300 IOPAMIDOL (ISOVUE-300) INJECTION 61% COMPARISON:  10/09/2017 CT abdomen/pelvis. FINDINGS: Lower chest: Subpleural 4 mm basilar right lower lobe solid pulmonary nodule (series 7/image 3), stable since 07/23/2016 chest CT, considered benign. No acute abnormality at the lung bases. Coronary atherosclerosis. Hepatobiliary: Normal  liver size. No liver mass. Normal gallbladder with no radiopaque cholelithiasis. No biliary ductal dilatation. Pancreas: Coarse calcifications throughout the atrophic pancreas with stable mild pancreatic duct dilation in the pancreatic body and tail up to 5 mm diameter, compatible with chronic pancreatitis. Low-attenuation 1.6 cm anterior pancreatic head lesion (series 2/image 48), stable since 07/23/2016 CT. No new pancreatic lesions. No peripancreatic fluid collections. Spleen: Normal size. No mass. Adrenals/Urinary Tract: No discrete adrenal nodules. Stable curvilinear right adrenal calcification compatible with remote insult. Multiple clustered nonobstructing stones in the lower left kidney, largest 9 x 4 mm. Fullness of the central left renal collecting system without overt left hydronephrosis. Normal caliber left ureter. Mild right hydroureteronephrosis, not appreciably changed since 10/09/2017 CT. Previously described hypodense 2.1 x 2.0 cm renal cortical focus in the anterior lower left kidney appears decreased to the 1.2 x 0.8 cm (series 4/image 14). Subcentimeter hypodense renal cortical lesions in the  posterior lower right kidney are too small to characterize and unchanged, probably benign. No new renal lesions. No evidence of ureteral stones. Ileal conduit is largely collapsed and appears normal with no stones or wall thickening. A small portion of the ileal conduit extends into the small umbilical hernia, unchanged. Status post cystectomy, with no mass or fluid collection in the cystectomy bed. Stomach/Bowel: Stomach is moderately distended with food debris and otherwise appears normal. Stable postsurgical changes from enteroenterostomy with no unexpected small bowel dilatation or small bowel wall thickening. Normal appendix. Normal large bowel with no diverticulosis, large bowel wall thickening or pericolonic fat stranding. Vascular/Lymphatic: Atherosclerotic nonaneurysmal abdominal aorta. Patent portal, splenic, hepatic and renal veins. No pathologically enlarged lymph nodes in the abdomen or pelvis. Reproductive: Status post prostatectomy, with no mass or fluid collection in the prostatectomy bed. Other: No pneumoperitoneum, ascites or focal fluid collection. Musculoskeletal: No aggressive appearing focal osseous lesions. Marked thoracolumbar spondylosis. IMPRESSION: 1. No acute abnormality. No evidence of bowel obstruction or acute bowel inflammation. No bowel wall thickening. No significant diverticular disease. 2. Stable postsurgical changes from cystoprostatectomy with ileal conduit urinary diversion. No evidence of local tumor recurrence in the pelvis. No findings suspicious for metastatic disease in the abdomen or pelvis. 3. Previously described low-attenuation focus in the lower left kidney is decreased, favoring resolving pyelonephritis. Nonobstructing left nephrolithiasis. 4. Chronic mild right hydroureteronephrosis is stable. No evidence of obstructing stone or mass. 5. Chronic pancreatitis. Low-attenuation 1.6 cm anterior pancreatic head lesion is stable since 07/23/2016 CT, probably a benign  pseudocyst. Continued follow-up recommended at MRI abdomen without and with IV contrast in 6-12 months. 6.  Aortic Atherosclerosis (ICD10-I70.0). Electronically Signed   By: Ilona Sorrel M.D.   On: 12/19/2017 12:01    EGD on 12/18/2017 Findings: The esophagus was normal. The stomach was normal. Biopsies were taken with a cold forceps for  histology. (Sidney protocol). The cardia and gastric fundus were normal on retroflexion. One non-bleeding superficial duodenal ulcer with no stigmata of bleeding  was found in the second portion of the duodenum. The lesion was 6 mm in  largest dimension. There was an adjacent shallow linear ulcer and a  diminutive linear ulcer in the duodenal sweep. There was clear bile and  no fresh or old blood in the UGI tract. Impression: - Normal esophagus. - Normal stomach. Biopsied. - One non-bleeding duodenal ulcer with no stigmata  of bleeding. Two additional smaller duodenal ,  clean-based ulcers. It is not clear that these ulcers are the source of  brisk GI bleeding causing severe acute  blood loss  anemia. The patient is now passing burgundy stool,  suggesting a lower GI source. Moderate Sedation: MAC sedation used Recommendation: - Return patient to hospital ward for ongoing care. - Perform a colonoscopy tomorrow.   Colonoscopy on 12/19/2017 Findings: The perianal and digital rectal examinations were normal. The terminal ileum appeared normal. The sigmoid colon was significantly tortuous, most likely due to prior  surgery. A small amount of clotted blood was found in the cecum. It was cleared   away, revealing no underlying lesions. Normal mucosa was found in the entire colon. There is no endoscopic evidence of bleeding, diverticula, inflammation,  mass or ulcerations in the entire colon. The exam was otherwise without abnormality on direct and retroflexion  views. No active bleeding or old blood was found in the TI or the colon,  except for the small clotted blood in cecum noted above. Impression: - Preparation of the colon was fair. Extensive  lavage performed. - The examined portion of the ileum was normal. - Tortuous colon. - Blood in the cecum. - Normal mucosa in the entire examined colon. - The examination was otherwise normal on direct  and retroflexion views. - No specimens collected. Source of bleeding unclear. Duodenal ulcers seen  6/7 were not clearly the source, and the initial  melena changed to hematochezia. Moderate Sedation: Moderate (conscious) sedation was administered by the endoscopy nurse  and supervised by the endoscopist. The following parameters were  monitored: oxygen saturation, heart rate, blood pressure, respiratory  rate, EKG, adequacy of pulmonary ventilation, and response to care.  Total physician intraservice time was 42 minutes. Recommendation: - Return patient to hospital ward for ongoing care. - Resume regular diet. - Continue present medications. - Perform a CT scan (computed tomography) of  abdomen with contrast and pelvis. Patient had a  radical  cystoprostatectomy with ileal conduit,  possibly precluding small bowel video capsule study. CBC tomorrow AM, sooner if recurrent bleeding.      Subjective: Patient seen and examined at bedside.  He denies any overnight fever, nausea, vomiting, black or bloody stools.  He is tolerating diet.  Feels better.  Discharge Exam: Vitals:   12/19/17 2143 12/20/17 0546  BP: 116/71 121/78  Pulse: (!) 56 (!) 58  Resp: 16 18  Temp: 98.2 F (36.8 C) 98.4 F (36.9 C)  SpO2: 97% 97%   Vitals:   12/19/17 0920 12/19/17 1442 12/19/17 2143 12/20/17 0546  BP: 135/69 116/61 116/71 121/78  Pulse: 71 61 (!) 56 (!) 58  Resp: 16 18 16 18   Temp:  98.5 F (36.9 C) 98.2 F (36.8 C) 98.4 F (36.9 C)  TempSrc:  Oral Oral Oral  SpO2: 96% 100% 97% 97%  Weight:      Height:        General: Pt is alert, awake, not in acute distress Cardiovascular: Rate controlled, S1/S2 + Respiratory: Bilateral decreased breath sounds at bases  abdominal: Soft, NT, ND, bowel sounds + Extremities: no edema, no cyanosis    The results of significant diagnostics from this hospitalization (including imaging, microbiology, ancillary and laboratory) are listed below for reference.     Microbiology: Recent Results (from the past 240 hour(s))  Urine culture     Status: Abnormal   Collection Time: 12/17/17 12:19 PM  Result Value Ref Range Status   Specimen Description   Final    URINE, RANDOM Performed at [facility] [address] [place], [place] [address]    Special Requests  Final    NONE Performed at [facility], [place] [address] [place], [place] [address]    Culture MULTIPLE SPECIES PRESENT, SUGGEST RECOLLECTION (A)  Final   Report Status 12/18/2017 FINAL  Final  Culture, blood (routine x 2)     Status: None (Preliminary result)   Collection Time: 12/18/17  5:48 AM  Result Value Ref Range Status   Specimen  Description   Final    BLOOD LEFT HAND Performed at [facility] [address], [place], [place] [address]    Special Requests   Final    BOTTLES DRAWN AEROBIC AND ANAEROBIC Blood Culture adequate volume Performed at [facility] [address], [place], [place] [address]    Culture   Final    NO GROWTH 1 DAY Performed at [facility], [place] [address], [place], [place] [address]    Report Status PENDING  Incomplete  Culture, blood (routine x 2)     Status: None (Preliminary result)   Collection Time: 12/18/17  5:50 AM  Result Value Ref Range Status   Specimen Description   Final    BLOOD RIGHT HAND Performed at [facility] [address] [place], [place] [address]    Special Requests   Final    BOTTLES DRAWN AEROBIC AND ANAEROBIC Blood Culture adequate volume Performed at [facility] [address] [place], [place] [address]    Culture   Final    NO GROWTH 1 DAY Performed at [facility], [place] [address], [place], [place] [address]    Report Status PENDING  Incomplete     Labs: BNP (last 3 results) No results for input(s): BNP in the last 8760 hours. Basic Metabolic Panel: Recent Labs  Lab 12/17/17 0953 12/17/17 1127 12/18/17 0545 12/19/17 0609 12/20/17 0511  NA 139 141 141 145 141  K 4.7 4.5 4.3 3.8 4.4  CL 108 110 111 114* 110  CO2 20* 22 22 21* 27  GLUCOSE 192* 164* 146* 136* 121*  BUN 39* 42* 44* 32* 19  CREATININE 1.28 1.26* 1.28* 1.25* 1.20  CALCIUM 8.4 8.2* 7.8* 8.1* 8.2*  MG  --   --   --  1.8 1.8   Liver Function Tests: Recent Labs  Lab 12/17/17 0953 12/17/17 1127  AST 12 11*  ALT 10 12*  ALKPHOS 70 57  BILITOT <0.2* 0.2*  PROT 6.2* 6.2*  ALBUMIN 2.7* 2.6*   No results for input(s): LIPASE, AMYLASE in the last 168 hours. No results for input(s): AMMONIA in the last 168 hours. CBC: Recent Labs  Lab 12/17/17 0953 12/17/17 1127 12/18/17 0545  12/18/17 1526 12/18/17 2008 12/19/17 0609 12/20/17 0511  WBC 7.4 6.6 7.4  --   --   --  6.6  NEUTROABS 5.5 4.8 4.3  --   --   --   --   HGB 5.2* 4.9* 7.0* 9.6* 10.3* 9.4* 8.7*  HCT 17.0* 15.7* 20.9* 28.3* 30.5* 28.5* 26.6*  MCV 82.9 83.1 83.3  --   --   --  86.1  PLT 537* 545* 398  --   --   --  368   Cardiac Enzymes: No results for input(s): CKTOTAL, CKMB, CKMBINDEX, TROPONINI in the last 168 hours. BNP: Invalid input(s): POCBNP CBG: Recent Labs  Lab 12/19/17 0941 12/19/17 1235 12/19/17 1707 12/19/17 2149 12/20/17 0749  GLUCAP 122* 219* 212* 132* 114*   D-Dimer No results for input(s): DDIMER in  the last 72 hours. Hgb A1c No results for input(s): HGBA1C in the last 72 hours. Lipid Profile No results for input(s): CHOL, HDL, LDLCALC, TRIG, CHOLHDL, LDLDIRECT in the last 72 hours. Thyroid function studies No results for input(s): TSH, T4TOTAL, T3FREE, THYROIDAB in the last 72 hours.  Invalid input(s): FREET3 Anemia work up No results for input(s): VITAMINB12, FOLATE, FERRITIN, TIBC, IRON, RETICCTPCT in the last 72 hours. Urinalysis    Component Value Date/Time   COLORURINE YELLOW 12/17/2017 1219   APPEARANCEUR HAZY (A) 12/17/2017 1219   LABSPEC 1.011 12/17/2017 1219   PHURINE 6.0 12/17/2017 1219   GLUCOSEU NEGATIVE 12/17/2017 1219   HGBUR SMALL (A) 12/17/2017 1219   BILIRUBINUR NEGATIVE 12/17/2017 1219   BILIRUBINUR negative 04/14/2016 1456   KETONESUR NEGATIVE 12/17/2017 1219   PROTEINUR NEGATIVE 12/17/2017 1219   UROBILINOGEN 0.2 04/14/2016 1456   NITRITE POSITIVE (A) 12/17/2017 1219   LEUKOCYTESUR LARGE (A) 12/17/2017 1219   Sepsis Labs Invalid input(s): PROCALCITONIN,  WBC,  LACTICIDVEN Microbiology Recent Results (from the past 240 hour(s))  Urine culture     Status: Abnormal   Collection Time: 12/17/17 12:19 PM  Result Value Ref Range Status   Specimen Description   Final    URINE, RANDOM Performed at [facility], [place]  [address] [place], [place] [address]    Special Requests   Final    NONE Performed at [facility], [place] [address] [place], [place] [address]    Culture MULTIPLE SPECIES PRESENT, SUGGEST RECOLLECTION (A)  Final   Report Status 12/18/2017 FINAL  Final  Culture, blood (routine x 2)     Status: None (Preliminary result)   Collection Time: 12/18/17  5:48 AM  Result Value Ref Range Status   Specimen Description   Final    BLOOD LEFT HAND Performed at [facility] [address], [place], [place] [address]    Special Requests   Final    BOTTLES DRAWN AEROBIC AND ANAEROBIC Blood Culture adequate volume Performed at [facility] [address] [place], [place] [address]    Culture   Final    NO GROWTH 1 DAY Performed at [facility], [place] [address], [place], [place] [address]    Report Status PENDING  Incomplete  Culture, blood (routine x 2)     Status: None (Preliminary result)   Collection Time: 12/18/17  5:50 AM  Result Value Ref Range Status   Specimen Description   Final    BLOOD RIGHT HAND Performed at [facility] [address], [place], [place] [address]    Special Requests   Final    BOTTLES DRAWN AEROBIC AND ANAEROBIC Blood Culture adequate volume Performed at [facility] [address], [place], [place] [address]    Culture   Final    NO GROWTH 1 DAY Performed at [facility], [place] [address] [place], [place] [address]    Report Status PENDING  Incomplete     Time coordinating discharge: 35 minutes  SIGNED:   Aline August, MD  [facility] 12/20/2017, 9:16 AM Pager: [phone]  If 7PM-7AM, please contact night-coverage [email or web address] Password [email or web address]

## 2017-12-21 ENCOUNTER — Encounter (HOSPITAL_COMMUNITY): Payer: Self-pay | Admitting: Gastroenterology

## 2017-12-21 ENCOUNTER — Encounter (HOSPITAL_COMMUNITY): Payer: Self-pay | Admitting: Anesthesiology

## 2017-12-21 ENCOUNTER — Telehealth: Payer: Self-pay | Admitting: Gastroenterology

## 2017-12-21 NOTE — Telephone Encounter (Signed)
Appointment scheduled for 12/24/17 @ 2:15pm. Left message for patient to return my call to confirm this appointment.

## 2017-12-21 NOTE — Telephone Encounter (Signed)
Please schedule with Anderson Malta on Thursday, looks like she has a couple of openings. Thanks.

## 2017-12-22 ENCOUNTER — Encounter: Payer: Self-pay | Admitting: Family Medicine

## 2017-12-23 ENCOUNTER — Encounter: Payer: Self-pay | Admitting: Family Medicine

## 2017-12-23 ENCOUNTER — Ambulatory Visit (INDEPENDENT_AMBULATORY_CARE_PROVIDER_SITE_OTHER): Payer: Medicare Other | Admitting: Family Medicine

## 2017-12-23 VITALS — BP 119/67 | HR 59 | Temp 98.3°F | Resp 16 | Ht 68.0 in | Wt 195.4 lb

## 2017-12-23 DIAGNOSIS — D508 Other iron deficiency anemias: Secondary | ICD-10-CM

## 2017-12-23 DIAGNOSIS — K922 Gastrointestinal hemorrhage, unspecified: Secondary | ICD-10-CM | POA: Diagnosis not present

## 2017-12-23 LAB — CULTURE, BLOOD (ROUTINE X 2)
Culture: NO GROWTH
Culture: NO GROWTH
Special Requests: ADEQUATE
Special Requests: ADEQUATE

## 2017-12-23 MED ORDER — FREESTYLE LIBRE 14 DAY SENSOR MISC: 1.0000 | Freq: Every day | 5 refills | Status: DC

## 2017-12-23 NOTE — Addendum Note (Signed)
Addended by: Onalee Hua on: 12/23/2017 04:15 PM   Modules accepted: Orders

## 2017-12-23 NOTE — Progress Notes (Signed)
12/23/2017  CC:  Chief Complaint  Patient presents with  . Hospitalization Follow-up    TCM    Patient is a 82 y.o. Caucasian male who presents for  hospital follow up, specifically [facility] face-to-face visit. Dates hospitalized: 6/6-6/9, 2019. Days since d/c from hospital: 3 days Patient was discharged from hospital to home, with outpt GI f/u. Reason for admission to hospital: acute anemia (Hb 5.2), guaiac +. Date of interactive (phone) contact with patient and/or caregiver: none, pt not out of hosp long enough.  I have reviewed patient's discharge summary plus pertinent specific notes, labs, and imaging from the hospitalization.   No site to explain bleeding was found on upper or lower endoscopy.  CT abd/pelv showed no acute problem. Transfused 5 U pRBCs: d/c Hb 8.7 (after rising to 10.3 after transfusions).  Went home on protonix bid.  "I feel good".  Has GI f/u tomorrow. Eating and drinking well, feels great energy level. No abd pain. Stool this morning looked "completely normal".  Saw a bit of melanotic stool in 1st BM after going home 2 d/a. He has remained off of aspirin. Glucoses at home tend to be high, but he has had some "ups and downs" since coming home.  Medication reconciliation was done today and patient is taking meds as recommended by discharging hospitalist/specialist.    PMH:  Past Medical History:  Diagnosis Date  . Abnormal EKG 2014   This led to stress test which was abnl, which led to cardiology referral: cath showed small vessel dz of 2nd diag branch with 70-80% stenosis--preserved LV function  . Bilateral renal cysts    Non-complex (CT 03/2016): no enhancement, coarse calcifications, mass effect does give false appearance of hydro on non-contract series (followed by urol).  . Bladder cancer ([place]) 03/2016   Invasive high grade urothelial carcinoma.  No mets at dx.  Cystoscopy + bx and partial resection of tumor in hosp when admitted for  gross hematuria and AUR.  Neoadj chemo 06/2016, then got cystoprostatectomy. (Urostomy--ileal conduit urinary diversion with refluxing anastamoses--has RLQ urostomy)-gets routine surveillance by urol (Dr. Tresa Moore), most recent 04/2017--no sign of recurrence--obs status  . CAD (coronary artery disease)    small vessel dz x 1 vessel on cath 2014: med mgmt  . Cerebellar cerebrovascular accident (CVA) without late effect 10/2017   Left, punctate: DAPT therapy x 3 wks per Dr. Leonie Man, neuro, then ASA 81mg  alone.  Stable as of 11/2017 f/u with neuro, Dr. Erlinda Hong.  . Chronic calcific pancreatitis ([place]) 03/2016   CT: also small cyst in head of pancreas that needs 6 mo f/u imaging (09/2016)  . Diabetes mellitus without complication ([place])    Insulin dependent; DKA admission 10/2017.  . GI bleed 12/2017   Presented with melena and Hb around 5.  Transfused 5 U pRBCs, started to get hematochezia in hosp: no clear explanation for GI bleeding was found on EGD or colonoscopy.  CT abd w/out acute abnormality.  [name] Heart murmur, systolic    Noted in prior PCP's records.  I recommended echocardiogram 07/2016 but pt declined.  [name] History of Bell's palsy   . History of chemotherapy   . Hyperlipidemia   . Hypertension   . Pancreas cyst 03/2016   CT abd/pelv 12/2017 for GI bleed w/u showed 1.6 cm pancreatic head cyst-->stable and likely benign.  Abd MRI f/u 6-12 mo recommended.  [name] Umbilical hernia    Repaired when he got his bladder surgery.    PSH:  Past  Surgical History:  Procedure Laterality Date  . BIOPSY  12/18/2017   Procedure: BIOPSY;  Surgeon: Doran Stabler, MD;  Location: [facility];  Service: Gastroenterology;;  . CARDIAC CATHETERIZATION  02/09/2013   small vessel dz of 2nd diag branch with 70-80% stenosis--preserved LV function. Medical therapy rec'd. Agustin Cree, PA)  . CARDIOVASCULAR STRESS TEST  01/26/2013   No ischemia.  Wall motion abnormalities at inferior base suggesting small area of infarction.  EF  64%.  . CAROTID ARTERY DOPPLERS  10/15/2017   1-39% stenosis R ICA, no stenosis L ICA.  [name] CATARACT EXTRACTION    . COLONOSCOPY N/A 12/19/2017   Clotted blood in cecum w/out any underlying abnormality found -->entired colon and terminal ilium normal.  Procedure: COLONOSCOPY;  Surgeon: Doran Stabler, MD;  Location: [facility];  Service: Gastroenterology;  Laterality: N/A;  . cystoprostatectomy w/LN surgery  10/17/2016   Ileal conduit: this is an incontinent urine diversion that directs urine from the ureters through a segment of isolated bowel to the surface of the abdominal wall via a cutaneous stoma, and urine drains continuously into an external ostomy appliance.  . CYSTOSCOPY WITH INJECTION N/A 10/17/2016   Procedure: CYSTOSCOPY WITH INJECTION OF INDOCYANINE GREEN DYE;  Surgeon: Alexis Frock, MD;  Location: [facility];  Service: Urology;  Laterality: N/A;  . ESOPHAGOGASTRODUODENOSCOPY N/A 12/18/2017   Mild chronic gastritis, no metaplasia or dysplasia.  H pylori pending as of 12/21/17.  nonbleeding duodenal ulcers.  Procedure: ESOPHAGOGASTRODUODENOSCOPY (EGD);  Surgeon: Doran Stabler, MD;  Location: [facility];  Service: Gastroenterology;  Laterality: N/A;  . EYE SURGERY     cataract surgery bilat   . IR GENERIC HISTORICAL  05/12/2016   IR US GUIDE VASC ACCESS RIGHT 05/12/2016 Greggory Keen, MD [facility]  . IR GENERIC HISTORICAL  05/12/2016   IR FLUORO GUIDE PORT INSERTION RIGHT 05/12/2016 Greggory Keen, MD [facility]  . IR REMOVAL TUN ACCESS W/ PORT W/O FL MOD SED  07/03/2017  . OSTOMY     urostomy  . TONSILLECTOMY AND ADENOIDECTOMY  Remote  . TRANSTHORACIC ECHOCARDIOGRAM  10/16/2017   EF 65-70%, normal wall motion, grd I DD.  [name] TRANSURETHRAL RESECTION OF PROSTATE N/A 04/02/2016   Procedure: CYSTOSCOPY, TRANSURETHRAL RESECTION OF BLADDER TUMOR;  Surgeon: [name], MD;  Location: [facility];  Service: Urology;  Laterality: N/A;  . UMBILICAL HERNIA REPAIR  10/2016  .  VASECTOMY      MEDS:  Outpatient Medications Prior to Visit  Medication Sig Dispense Refill  . Continuous Blood Gluc Sensor (FREESTYLE LIBRE 14 DAY SENSOR) MISC 1 each by Does not apply route daily. 1 each 5  . insulin aspart (NOVOLOG FLEXPEN) 100 UNIT/ML FlexPen CBG < 70: implement hypoglycemia protocol CBG 70 - 120: 3 units CBG 121 - 150: 5 units CBG 151 - 200: 6 units CBG 201 - 250: 8 units CBG 251 - 300: 11 units CBG 301 - 350: 14 units CBG 351 - 400: 18 units CBG > 400: call MD 15 mL 0  . insulin glargine (LANTUS) 100 unit/mL SOPN Inject 0.09 mLs (9 Units total) into the skin daily.    . Insulin Pen Needle (PEN NEEDLES) 32G X 4 MM MISC 1 each by Does not apply route daily. 100 each 11  . Insulin Pen Needle 31G X 5 MM MISC Inject insulin via insulin pen 3 x daily with meals 100 each 3  . pantoprazole (PROTONIX) 40 MG tablet Take 1 tablet (40 mg total) by  mouth 2 (two) times daily before a meal. 60 tablet 0  . pravastatin (PRAVACHOL) 20 MG tablet TAKE 1 TABLET BY MOUTH EVERY DAY 90 tablet 1  . terbinafine (LAMISIL) 250 MG tablet 1 tab po qd x 90 days 90 tablet 0   No facility-administered medications prior to visit.    EXAM: BP 119/67 (BP Location: Right Arm, Patient Position: Sitting, Cuff Size: Normal)   Pulse (!) 59   Temp 98.3 F (36.8 C) (Oral)   Resp 16   Ht 5\' 8"  (1.727 m)   Wt 195 lb 6 oz (88.6 kg)   SpO2 100%   BMI 29.71 kg/m  Gen: Alert, well appearing.  Patient is oriented to person, place, time, and situation. AFFECT: pleasant, lucid thought and speech. CV: RRR, trace systolic murmur, no r/g.   LUNGS: CTA bilat, nonlabored resps, good aeration in all lung fields.   Pertinent labs/imaging Lab Results  Component Value Date   HGBA1C 13.4 (H) 10/13/2017   Lab Results  Component Value Date   WBC 6.6 12/20/2017   HGB 8.7 (L) 12/20/2017   HCT 26.6 (L) 12/20/2017   MCV 86.1 12/20/2017   PLT 368 12/20/2017     Chemistry      Component Value Date/Time    NA 141 12/20/2017 0511   NA 133 (A) 10/03/2017   NA 139 06/18/2017 1125   K 4.4 12/20/2017 0511   K 4.5 06/18/2017 1125   CL 110 12/20/2017 0511   CO2 27 12/20/2017 0511   CO2 27 10/02/2017   CO2 21 (L) 06/18/2017 1125   BUN 19 12/20/2017 0511   BUN 46 (A) 10/03/2017   BUN 26.5 (H) 06/18/2017 1125   CREATININE 1.20 12/20/2017 0511   CREATININE 1.2 06/18/2017 1125   GLU 525 10/03/2017      Component Value Date/Time   CALCIUM 8.2 (L) 12/20/2017 0511   CALCIUM 9.3 06/18/2017 1125   ALKPHOS 57 12/17/2017 1127   ALKPHOS 67 06/18/2017 1125   AST 11 (L) 12/17/2017 1127   AST 12 06/18/2017 1125   ALT 12 (L) 12/17/2017 1127   ALT 13 06/18/2017 1125   BILITOT 0.2 (L) 12/17/2017 1127   BILITOT 0.40 06/18/2017 1125       ASSESSMENT/PLAN:  Acute GI bleed, unknown site of bleeding. He is feeling well. Stay off ASA and continue protonix bid. CBC and BMET today. GI f/u tomorrow, likely to discuss further imaging to assess small bowel.  Medical decision making of moderate complexity was utilized today.  An After Visit Summary was printed and given to the patient.  FOLLOW UP:  Keep f/u schedule for about 1 mo for now (DM f/u---of note, due to having 5 U blood in hosp, his next A1c will be essentially uninterpretable, so we'll have to make any diabetes treatment regimen changes based on home glucose monitoring.  Signed:  Crissie Sickles, MD           12/23/2017

## 2017-12-23 NOTE — Addendum Note (Signed)
Addended by: Onalee Hua on: 12/23/2017 03:25 PM   Modules accepted: Orders

## 2017-12-24 ENCOUNTER — Encounter: Payer: Self-pay | Admitting: Physician Assistant

## 2017-12-24 ENCOUNTER — Other Ambulatory Visit (INDEPENDENT_AMBULATORY_CARE_PROVIDER_SITE_OTHER): Payer: Medicare Other

## 2017-12-24 ENCOUNTER — Ambulatory Visit (INDEPENDENT_AMBULATORY_CARE_PROVIDER_SITE_OTHER): Payer: Medicare Other | Admitting: Physician Assistant

## 2017-12-24 VITALS — BP 100/62 | HR 59 | Ht 68.0 in | Wt 197.4 lb

## 2017-12-24 DIAGNOSIS — I6330 Cerebral infarction due to thrombosis of unspecified cerebral artery: Secondary | ICD-10-CM

## 2017-12-24 DIAGNOSIS — K922 Gastrointestinal hemorrhage, unspecified: Secondary | ICD-10-CM

## 2017-12-24 DIAGNOSIS — D508 Other iron deficiency anemias: Secondary | ICD-10-CM

## 2017-12-24 DIAGNOSIS — R935 Abnormal findings on diagnostic imaging of other abdominal regions, including retroperitoneum: Secondary | ICD-10-CM

## 2017-12-24 DIAGNOSIS — D509 Iron deficiency anemia, unspecified: Secondary | ICD-10-CM

## 2017-12-24 LAB — BASIC METABOLIC PANEL
BUN: 29 mg/dL — ABNORMAL HIGH (ref 6–23)
CO2: 25 mEq/L (ref 19–32)
Calcium: 8.9 mg/dL (ref 8.4–10.5)
Chloride: 108 mEq/L (ref 96–112)
Creatinine, Ser: 1.25 mg/dL (ref 0.40–1.50)
GFR: 59.03 mL/min — ABNORMAL LOW (ref >60.00)
Glucose, Bld: 108 mg/dL — ABNORMAL HIGH (ref 70–99)
Potassium: 4.6 mEq/L (ref 3.5–5.1)
Sodium: 140 mEq/L (ref 135–145)

## 2017-12-24 LAB — CBC
HCT: 27.7 % — ABNORMAL LOW (ref 39.0–52.0)
Hemoglobin: 9.3 g/dL — ABNORMAL LOW (ref 13.0–17.0)
MCHC: 33.4 g/dL (ref 30.0–36.0)
MCV: 86.6 fl (ref 78.0–100.0)
Platelets: 364 10*3/uL (ref 150.0–400.0)
RBC: 3.2 Mil/uL — ABNORMAL LOW (ref 4.22–5.81)
RDW: 17.7 % — ABNORMAL HIGH (ref 11.5–15.5)
WBC: 7.9 10*3/uL (ref 4.0–10.5)

## 2017-12-24 NOTE — Progress Notes (Signed)
Chief Complaint: Follow-up after hospitalization for symptomatic anemia  HPI:    Mr. Walberg is an 82 year old male with a past medical history as listed below including cancer diagnosis in September 2017 status post TURBT on 04/02/2016, diabetes, hyperlipidemia and stroke, who presents to clinic today for follow-up after his recent hospitalization for symptomatic anemia.    12/17/2017-12/20/2017 was hospitalized for weakness and dark stools as well as anemia.  Hemoglobin on presentation was 4.9, started on IV Protonix, patient received a total of 5 units PRBCs during hospitalization.  He had EGD and colonoscopy.  12/18/2017 EGD Dr. Loletha Carrow with one nonbleeding duodenal ulcer and 2 additional smaller duodenal ulcers with no stigmata of bleeding. 12/19/2017 colonoscopy by Dr. Loletha Carrow with fair preparation of the colon, tortuous colon, blood in the cecum, source of bleeding unclear.  CT was ordered and showed no acute abnormality with no evidence of bowel obstruction or acute bowel inflammation, no bowel wall thickening, no significant diverticular disease, stable postsurgical changes from cystoprostatectomy with ileal conduit urinary diversion.  Previously described low-attenuation focus in the lower left kidney was decreased, favoring resolving pyelonephritis.  Nonobstructing left nephrolithiasis.  Chronic mild right hydroureteronephrosis was stable.  Chronic pancreatitis.  Low-attenuation 1.6 cm anterior pancreatic head lesion was stable since 07/23/2016 CT.  Probably benign pseudocyst.  Recommend patient have an MRI of the abdomen without and with IV contrast in 6 to 12 months.  As well as aortic atherosclerosis.  It was noted patient had a radical cystoprostatectomy with ileal conduit, possibly precluding small bowel video capsule study.  It was recommend patient stay on a twice daily PPI and stay off of aspirin.     12/20/2017 CBC showing hemoglobin of 8.7. Repeated today, results pending.    Today, presents with his  wife.  Explains that he feels "just great".  He has seen no further bloody bowel movements, dark or otherwise and has had no problems since his hospitalization.  His wife asked questions regarding a pill capsule endoscopy as she thought this was what was recommended during hospitalization.   Patient denies fever, chills, weight loss, anorexia, nausea, vomiting, heartburn, reflux or symptoms that awaken him at night.   Past Medical History:  Diagnosis Date  . Abnormal EKG 2014   This led to stress test which was abnl, which led to cardiology referral: cath showed small vessel dz of 2nd diag branch with 70-80% stenosis--preserved LV function  . Bilateral renal cysts    Non-complex (CT 03/2016): no enhancement, coarse calcifications, mass effect does give false appearance of hydro on non-contract series (followed by urol).  . Bladder cancer ([place]) 03/2016   Invasive high grade urothelial carcinoma.  No mets at dx.  Cystoscopy + bx and partial resection of tumor in hosp when admitted for gross hematuria and AUR.  Neoadj chemo 06/2016, then got cystoprostatectomy. (Urostomy--ileal conduit urinary diversion with refluxing anastamoses--has RLQ urostomy)-gets routine surveillance by urol (Dr. Tresa Moore), most recent 04/2017--no sign of recurrence--obs status  . CAD (coronary artery disease)    small vessel dz x 1 vessel on cath 2014: med mgmt  . Cerebellar cerebrovascular accident (CVA) without late effect 10/2017   Left, punctate: DAPT therapy x 3 wks per Dr. Leonie Man, neuro, then ASA 81mg  alone.  Stable as of 11/2017 f/u with neuro, Dr. Erlinda Hong.  . Chronic calcific pancreatitis ([place]) 03/2016   CT: also small cyst in head of pancreas that needs 6 mo f/u imaging (09/2016)  . Diabetes mellitus without complication ([place])  Insulin dependent; DKA admission 10/2017.  . GI bleed 12/2017   Presented with melena and Hb around 5.  Transfused 5 U pRBCs, started to get hematochezia in hosp: no clear explanation for GI bleeding  was found on EGD or colonoscopy.  CT abd w/out acute abnormality.  [name] Heart murmur, systolic    Noted in prior PCP's records.  I recommended echocardiogram 07/2016 but pt declined.  [name] History of Bell's palsy   . History of chemotherapy   . Hyperlipidemia   . Hypertension   . Pancreas cyst 03/2016   CT abd/pelv 12/2017 for GI bleed w/u showed 1.6 cm pancreatic head cyst-->stable and likely benign.  Abd MRI f/u 6-12 mo recommended.  [name] Umbilical hernia    Repaired when he got his bladder surgery.    Past Surgical History:  Procedure Laterality Date  . BIOPSY  12/18/2017   Procedure: BIOPSY;  Surgeon: Doran Stabler, MD;  Location: [facility];  Service: Gastroenterology;;  . CARDIAC CATHETERIZATION  02/09/2013   small vessel dz of 2nd diag branch with 70-80% stenosis--preserved LV function. Medical therapy rec'd. Agustin Cree, PA)  . CARDIOVASCULAR STRESS TEST  01/26/2013   No ischemia.  Wall motion abnormalities at inferior base suggesting small area of infarction.  EF 64%.  . CAROTID ARTERY DOPPLERS  10/15/2017   1-39% stenosis R ICA, no stenosis L ICA.  [name] CATARACT EXTRACTION    . COLONOSCOPY N/A 12/19/2017   Clotted blood in cecum w/out any underlying abnormality found -->entired colon and terminal ilium normal.  Procedure: COLONOSCOPY;  Surgeon: Doran Stabler, MD;  Location: [facility];  Service: Gastroenterology;  Laterality: N/A;  . cystoprostatectomy w/LN surgery  10/17/2016   Ileal conduit: this is an incontinent urine diversion that directs urine from the ureters through a segment of isolated bowel to the surface of the abdominal wall via a cutaneous stoma, and urine drains continuously into an external ostomy appliance.  . CYSTOSCOPY WITH INJECTION N/A 10/17/2016   Procedure: CYSTOSCOPY WITH INJECTION OF INDOCYANINE GREEN DYE;  Surgeon: Alexis Frock, MD;  Location: [facility];  Service: Urology;  Laterality: N/A;  . ESOPHAGOGASTRODUODENOSCOPY N/A 12/18/2017   Mild chronic  gastritis, no metaplasia or dysplasia.  H pylori pending as of 12/21/17.  nonbleeding duodenal ulcers.  Procedure: ESOPHAGOGASTRODUODENOSCOPY (EGD);  Surgeon: Doran Stabler, MD;  Location: [facility];  Service: Gastroenterology;  Laterality: N/A;  . EYE SURGERY     cataract surgery bilat   . IR GENERIC HISTORICAL  05/12/2016   IR US GUIDE VASC ACCESS RIGHT 05/12/2016 Greggory Keen, MD [facility]  . IR GENERIC HISTORICAL  05/12/2016   IR FLUORO GUIDE PORT INSERTION RIGHT 05/12/2016 Greggory Keen, MD [facility]  . IR REMOVAL TUN ACCESS W/ PORT W/O FL MOD SED  07/03/2017  . OSTOMY     urostomy  . TONSILLECTOMY AND ADENOIDECTOMY  Remote  . TRANSTHORACIC ECHOCARDIOGRAM  10/16/2017   EF 65-70%, normal wall motion, grd I DD.  [name] TRANSURETHRAL RESECTION OF PROSTATE N/A 04/02/2016   Procedure: CYSTOSCOPY, TRANSURETHRAL RESECTION OF BLADDER TUMOR;  Surgeon: [name], MD;  Location: [facility];  Service: Urology;  Laterality: N/A;  . UMBILICAL HERNIA REPAIR  10/2016  . VASECTOMY      Current Outpatient Medications  Medication Sig Dispense Refill  . Continuous Blood Gluc Sensor (FREESTYLE LIBRE 14 DAY SENSOR) MISC 1 each by Does not apply route daily. 1 each 5  . insulin aspart (NOVOLOG FLEXPEN) 100 UNIT/ML FlexPen CBG <  70: implement hypoglycemia protocol CBG 70 - 120: 3 units CBG 121 - 150: 5 units CBG 151 - 200: 6 units CBG 201 - 250: 8 units CBG 251 - 300: 11 units CBG 301 - 350: 14 units CBG 351 - 400: 18 units CBG > 400: call MD 15 mL 0  . insulin glargine (LANTUS) 100 unit/mL SOPN Inject 0.09 mLs (9 Units total) into the skin daily.    . Insulin Pen Needle (PEN NEEDLES) 32G X 4 MM MISC 1 each by Does not apply route daily. 100 each 11  . Insulin Pen Needle 31G X 5 MM MISC Inject insulin via insulin pen 3 x daily with meals 100 each 3  . pantoprazole (PROTONIX) 40 MG tablet Take 1 tablet (40 mg total) by mouth 2 (two) times daily before a meal. 60 tablet 0  . pravastatin  (PRAVACHOL) 20 MG tablet TAKE 1 TABLET BY MOUTH EVERY DAY 90 tablet 1  . terbinafine (LAMISIL) 250 MG tablet 1 tab po qd x 90 days 90 tablet 0   No current facility-administered medications for this visit.     Allergies as of 12/24/2017 - Review Complete 12/24/2017  Allergen Reaction Noted  . Other Swelling 07/03/2017    Family History  Problem Relation Age of Onset  . Diabetes Mellitus I Mother   . Diabetes Mellitus I Sister   . Stroke Paternal Grandfather     Social History   Socioeconomic History  . Marital status: Married    Spouse name: Not on file  . Number of children: Not on file  . Years of education: Not on file  . Highest education level: Not on file  Occupational History  . Not on file  Social Needs  . Financial resource strain: Not on file  . Food insecurity:    Worry: Not on file    Inability: Not on file  . Transportation needs:    Medical: Not on file    Non-medical: Not on file  Tobacco Use  . Smoking status: Former Smoker    Years: 3.00  . Smokeless tobacco: Never Used  Substance and Sexual Activity  . Alcohol use: No    Frequency: Never  . Drug use: No  . Sexual activity: Not on file  Lifestyle  . Physical activity:    Days per week: Not on file    Minutes per session: Not on file  . Stress: Not on file  Relationships  . Social connections:    Talks on phone: Not on file    Gets together: Not on file    Attends religious service: Not on file    Active member of club or organization: Not on file    Attends meetings of clubs or organizations: Not on file    Relationship status: Not on file  . Intimate partner violence:    Fear of current or ex partner: Not on file    Emotionally abused: Not on file    Physically abused: Not on file    Forced sexual activity: Not on file  Other Topics Concern  . Not on file  Social History Narrative   Married, 3 children, 7 grandchildren.   Occupation: retired Engineer, site.   No tob, rare alc.    Review of  Systems:    Constitutional: No weight loss, fever or chills Cardiovascular: No chest pain Respiratory: No SOB  Gastrointestinal: See HPI and otherwise negative   Physical Exam:  Vital signs: BP 100/62   Pulse [name])  59   Ht 5\' 8"  (1.727 m)   Wt 197 lb 6.4 oz (89.5 kg)   BMI 30.01 kg/m    Constitutional:   Pleasant Caucasian male appears to be in NAD, Well developed, Well nourished, alert and cooperative Respiratory: Respirations even and unlabored. Lungs clear to auscultation bilaterally.   No wheezes, crackles, or rhonchi.  Cardiovascular: Normal S1, S2. No MRG. Regular rate and rhythm. No peripheral edema, cyanosis or pallor.  Gastrointestinal:  Soft, nondistended, nontender. No rebound or guarding. Normal bowel sounds. No appreciable masses or hepatomegaly. Psychiatric: Demonstrates good judgement and reason without abnormal affect or behaviors.  RELEVANT LABS AND IMAGING: CBC    Component Value Date/Time   WBC 6.6 12/20/2017 0511   RBC 3.09 (L) 12/20/2017 0511   HGB 8.7 (L) 12/20/2017 0511   HGB 13.2 06/18/2017 1125   HCT 26.6 (L) 12/20/2017 0511   HCT 39.4 10/14/2017 1154   HCT 39.7 06/18/2017 1125   PLT 368 12/20/2017 0511   PLT 241 06/18/2017 1125   MCV 86.1 12/20/2017 0511   MCV 91.7 06/18/2017 1125   MCH 28.2 12/20/2017 0511   MCHC 32.7 12/20/2017 0511   RDW 16.8 (H) 12/20/2017 0511   RDW 13.2 06/18/2017 1125   LYMPHSABS 2.6 12/18/2017 0545   LYMPHSABS 2.5 06/18/2017 1125   MONOABS 0.3 12/18/2017 0545   MONOABS 0.4 06/18/2017 1125   EOSABS 0.1 12/18/2017 0545   EOSABS 0.1 06/18/2017 1125   BASOSABS 0.1 12/18/2017 0545   BASOSABS 0.1 06/18/2017 1125    CMP     Component Value Date/Time   NA 141 12/20/2017 0511   NA 133 (A) 10/03/2017   NA 139 06/18/2017 1125   K 4.4 12/20/2017 0511   K 4.5 06/18/2017 1125   CL 110 12/20/2017 0511   CO2 27 12/20/2017 0511   CO2 27 10/02/2017   CO2 21 (L) 06/18/2017 1125   GLUCOSE 121 (H) 12/20/2017 0511   GLUCOSE 193  (H) 06/18/2017 1125   BUN 19 12/20/2017 0511   BUN 46 (A) 10/03/2017   BUN 26.5 (H) 06/18/2017 1125   CREATININE 1.20 12/20/2017 0511   CREATININE 1.2 06/18/2017 1125   CALCIUM 8.2 (L) 12/20/2017 0511   CALCIUM 9.3 06/18/2017 1125   PROT 6.2 (L) 12/17/2017 1127   PROT 7.1 06/18/2017 1125   ALBUMIN 2.6 (L) 12/17/2017 1127   ALBUMIN 3.6 06/18/2017 1125   AST 11 (L) 12/17/2017 1127   AST 12 06/18/2017 1125   ALT 12 (L) 12/17/2017 1127   ALT 13 06/18/2017 1125   ALKPHOS 57 12/17/2017 1127   ALKPHOS 67 06/18/2017 1125   BILITOT 0.2 (L) 12/17/2017 1127   BILITOT 0.40 06/18/2017 1125   GFRNONAA 55 (L) 12/20/2017 0511   GFRAA >60 12/20/2017 0511   EXAM: CT ABDOMEN AND PELVIS WITH CONTRAST 12/19/17  TECHNIQUE: Multidetector CT imaging of the abdomen and pelvis was performed using the standard protocol following bolus administration of intravenous contrast.  CONTRAST:  117mL ISOVUE-300 IOPAMIDOL (ISOVUE-300) INJECTION 61%  COMPARISON:  10/09/2017 CT abdomen/pelvis.  FINDINGS: Lower chest: Subpleural 4 mm basilar right lower lobe solid pulmonary nodule (series 7/image 3), stable since 07/23/2016 chest CT, considered benign. No acute abnormality at the lung bases. Coronary atherosclerosis.  Hepatobiliary: Normal liver size. No liver mass. Normal gallbladder with no radiopaque cholelithiasis. No biliary ductal dilatation.  Pancreas: Coarse calcifications throughout the atrophic pancreas with stable mild pancreatic duct dilation in the pancreatic body and tail up to 5 mm diameter, compatible with chronic  pancreatitis. Low-attenuation 1.6 cm anterior pancreatic head lesion (series 2/image 48), stable since 07/23/2016 CT. No new pancreatic lesions. No peripancreatic fluid collections.  Spleen: Normal size. No mass.  Adrenals/Urinary Tract: No discrete adrenal nodules. Stable curvilinear right adrenal calcification compatible with remote insult. Multiple clustered  nonobstructing stones in the lower left kidney, largest 9 x 4 mm. Fullness of the central left renal collecting system without overt left hydronephrosis. Normal caliber left ureter. Mild right hydroureteronephrosis, not appreciably changed since 10/09/2017 CT. Previously described hypodense 2.1 x 2.0 cm renal cortical focus in the anterior lower left kidney appears decreased to the 1.2 x 0.8 cm (series 4/image 14). Subcentimeter hypodense renal cortical lesions in the posterior lower right kidney are too small to characterize and unchanged, probably benign. No new renal lesions. No evidence of ureteral stones. Ileal conduit is largely collapsed and appears normal with no stones or wall thickening. A small portion of the ileal conduit extends into the small umbilical hernia, unchanged. Status post cystectomy, with no mass or fluid collection in the cystectomy bed.  Stomach/Bowel: Stomach is moderately distended with food debris and otherwise appears normal. Stable postsurgical changes from enteroenterostomy with no unexpected small bowel dilatation or small bowel wall thickening. Normal appendix. Normal large bowel with no diverticulosis, large bowel wall thickening or pericolonic fat stranding.  Vascular/Lymphatic: Atherosclerotic nonaneurysmal abdominal aorta. Patent portal, splenic, hepatic and renal veins. No pathologically enlarged lymph nodes in the abdomen or pelvis.  Reproductive: Status post prostatectomy, with no mass or fluid collection in the prostatectomy bed.  Other: No pneumoperitoneum, ascites or focal fluid collection.  Musculoskeletal: No aggressive appearing focal osseous lesions. Marked thoracolumbar spondylosis.  IMPRESSION: 1. No acute abnormality. No evidence of bowel obstruction or acute bowel inflammation. No bowel wall thickening. No significant diverticular disease. 2. Stable postsurgical changes from cystoprostatectomy with ileal conduit urinary  diversion. No evidence of local tumor recurrence in the pelvis. No findings suspicious for metastatic disease in the abdomen or pelvis. 3. Previously described low-attenuation focus in the lower left kidney is decreased, favoring resolving pyelonephritis. Nonobstructing left nephrolithiasis. 4. Chronic mild right hydroureteronephrosis is stable. No evidence of obstructing stone or mass. 5. Chronic pancreatitis. Low-attenuation 1.6 cm anterior pancreatic head lesion is stable since 07/23/2016 CT, probably a benign pseudocyst. Continued follow-up recommended at MRI abdomen without and with IV contrast in 6-12 months. 6.  Aortic Atherosclerosis (ICD10-I70.0).   Electronically Signed   By: Ilona Sorrel M.D.   On: 12/19/2017 12:01  Assessment: 1.  Abnormal CT of the abdomen: Showed chronic pancreatitis as well as a low-attenuation 1.6 cm anterior pancreatic head lesion which was stable since 07/23/2016 CT; likely pseudocyst vs neoplasm 2.  IDA: Requiring 5 units PRBCs at recent admission, EGD with duodenal ulcers thought possible source of bleeding, colonoscopy with fresh blood but no etiology, CT as above with no etiology, bleeding stopped on its own  Plan: 1.  Patient has been placed in recall for MRI abdomen with and without IV contrast in 6 months to check on pancreatic lesion. 2.  Discussed case with Dr. Loletha Carrow at time of patient's visit.  He does not recommend patient have a pill capsule endoscopy at this time.  He does have some reservations regarding possible stricturing after surgery involving patient's ileum.  Recommend we order a small bowel follow-through today in case patient does have further signs of bleeding so that we know whether or not we can proceed with pill capsule at that time.  Discussed this with  wife and patient.  They are in agreement. 3.  Will await patient's labs drawn today including CBC.  If hemoglobin is stable, no further intervention needed at this time. 4.   Patient to follow in clinic with Dr. Loletha Carrow or myself as needed in the future.  Ellouise Newer, PA-C [facility] 12/24/2017, 2:06 PM  Cc: McGowen, [name], MD

## 2017-12-24 NOTE — Patient Instructions (Addendum)
Continue Pantoprazole 40 mg twice a day.  You have been scheduled for an Upper GI Series and Small Bowel Follow Thru at [facility] Your appointment is on 01-06-18 at 9:30 am. Please arrive 15 minutes prior to your test for registration. Make certain not to have anything to eat or drink after midnight on the night before your test. If you need to reschedule, please contact radiology at [phone]. --------------------------------------------------------------------------------------------------------------- An upper GI series uses x rays to help diagnose problems of the upper GI tract, which includes the esophagus, stomach, and duodenum. The duodenum is the first part of the small intestine. An upper GI series is conducted by a radiology technologist or a radiologist-a doctor who specializes in x-ray imaging-at a hospital or outpatient center. While sitting or standing in front of an x-ray machine, the patient drinks barium liquid, which is often white and has a chalky consistency and taste. The barium liquid coats the lining of the upper GI tract and makes signs of disease show up more clearly on x rays. X-ray video, called fluoroscopy, is used to view the barium liquid moving through the esophagus, stomach, and duodenum. Additional x rays and fluoroscopy are performed while the patient lies on an x-ray table. To fully coat the upper GI tract with barium liquid, the technologist or radiologist may press on the abdomen or ask the patient to change position. Patients hold still in various positions, allowing the technologist or radiologist to take x rays of the upper GI tract at different angles. If a technologist conducts the upper GI series, a radiologist will later examine the images to look for problems.  This test typically takes about 1 hour to  complete --------------------------------------------------------------------------------------------------------------------------------------------- The Small Bowel Follow Thru examination is used to visualize the entire small bowel (intestines); specifically the connection between the small and large intestine. You will be positioned on a flat x-ray table and an image of your abdomen taken. Then the technologist will show the x-ray to the radiologist. The radiologist will instruct your technologist how much (1-2 cups) barium sulfate you will drink and when to begin taking the timed x-rays, usually 15-30 minutes after you begin drinking. Barium is a harmless substance that will highlight your small intestine by absorbing x-ray. The taste is chalky and it feels very heavy both in the cup and in your stomach.  After the first x-ray is taken and shown to the radiologist, he/she will determine when the next image is to be taken. This is repeated until the barium has reached the end of the small intestine and enters the beginning of the colon (cecum). At such time when the barium spills into the colon, you will be positioned on the x-ray table once again. The radiologist will use a fluoroscopic camera to take some detailed pictures of the connection between your small intestine and colon. The fluoroscope is an x-ray unit that works with a television/computer screen. The radiologist will apply pressure to your abdomen with his/her hand and a lead glove, a plastic paddle, or a paddle with an inflated rubber balloon on the end. This is to spread apart your loops of intestine so he/she can see all areas.   This test typically takes around 1 hour to complete.  Important: Drink plenty of water (8-10 cups/day) for a few days following the procedure to avoid constipation and blockage. The barium will make your stools white for a few  days. --------------------------------------------------------------------------------------------------------------------------------------------    You will be due for a recall MRI  in December 2019. We will send you a reminder in the mail when it gets closer to that time.

## 2017-12-25 ENCOUNTER — Encounter: Payer: Self-pay | Admitting: Family Medicine

## 2017-12-25 NOTE — Progress Notes (Signed)
Thank you for sending this case to me, and for discussing it with me yesterday. I have reviewed the entire note, and the outlined plan seems appropriate.  I had not planned a small bowel capsule because it may have been the DU that bled, and prior ileal surgery (for ileal conduit)  may increase risk for capsule retention. I am not likely to perform any more testing such as capsule study unless he has further bleeding. SB xray series ordered so we can see the distal ileum in case capsule needed at some point.   Wilfrid Lund, MD

## 2017-12-29 ENCOUNTER — Ambulatory Visit (HOSPITAL_COMMUNITY): Payer: Medicare Other

## 2018-01-06 ENCOUNTER — Ambulatory Visit (HOSPITAL_COMMUNITY)
Admission: RE | Admit: 2018-01-06 | Discharge: 2018-01-06 | Disposition: A | Discharge status: Home / Self Care | Payer: Medicare Other | Source: Ambulatory Visit | Attending: Physician Assistant | Admitting: Physician Assistant

## 2018-01-06 DIAGNOSIS — K219 Gastro-esophageal reflux disease without esophagitis: Secondary | ICD-10-CM | POA: Insufficient documentation

## 2018-01-06 DIAGNOSIS — D509 Iron deficiency anemia, unspecified: Secondary | ICD-10-CM | POA: Diagnosis not present

## 2018-01-06 DIAGNOSIS — K2980 Duodenitis without bleeding: Secondary | ICD-10-CM | POA: Diagnosis not present

## 2018-01-15 ENCOUNTER — Other Ambulatory Visit: Payer: Self-pay | Admitting: Family Medicine

## 2018-01-21 ENCOUNTER — Ambulatory Visit (INDEPENDENT_AMBULATORY_CARE_PROVIDER_SITE_OTHER): Payer: Medicare Other | Admitting: Family Medicine

## 2018-01-21 ENCOUNTER — Encounter: Payer: Self-pay | Admitting: Family Medicine

## 2018-01-21 VITALS — BP 130/68 | HR 59 | Temp 98.4°F | Resp 16 | Ht 68.0 in | Wt 209.5 lb

## 2018-01-21 DIAGNOSIS — I6330 Cerebral infarction due to thrombosis of unspecified cerebral artery: Secondary | ICD-10-CM | POA: Diagnosis not present

## 2018-01-21 DIAGNOSIS — K922 Gastrointestinal hemorrhage, unspecified: Secondary | ICD-10-CM

## 2018-01-21 DIAGNOSIS — I10 Essential (primary) hypertension: Secondary | ICD-10-CM

## 2018-01-21 DIAGNOSIS — Z794 Long term (current) use of insulin: Secondary | ICD-10-CM | POA: Diagnosis not present

## 2018-01-21 DIAGNOSIS — E118 Type 2 diabetes mellitus with unspecified complications: Secondary | ICD-10-CM

## 2018-01-21 DIAGNOSIS — D509 Iron deficiency anemia, unspecified: Secondary | ICD-10-CM | POA: Diagnosis not present

## 2018-01-21 DIAGNOSIS — E7800 Pure hypercholesterolemia, unspecified: Secondary | ICD-10-CM | POA: Diagnosis not present

## 2018-01-21 LAB — MICROALBUMIN / CREATININE URINE RATIO
Creatinine,U: 30.3 mg/dL
Microalb Creat Ratio: 9.4 mg/g (ref 0.0–30.0)
Microalb, Ur: 2.8 mg/dL — ABNORMAL HIGH (ref 0.0–1.9)

## 2018-01-21 MED ORDER — LANCETS 30G MISC: 0 refills | Status: DC

## 2018-01-21 MED ORDER — PANTOPRAZOLE SODIUM 40 MG PO TBEC: 40.0000 mg | DELAYED_RELEASE_TABLET | Freq: Two times a day (BID) | ORAL | 0 refills | Status: DC

## 2018-01-21 NOTE — Progress Notes (Signed)
OFFICE VISIT  01/21/2018   CC:  Chief Complaint  Patient presents with  . Follow-up    RCI, pt is not fasting.     HPI:    Patient is a 82 y.o. Caucasian male who presents accompanied by his wife for 2 mo f/u DM 2, HTN, and IDA.  He had significant IDA from GI bleed 11/2017, endoscopies revealed duodenal ulcer as possible culprit lesion. Dr. Loletha Carrow does not plan capsule endoscopy unless further bleeding is noted. He did do a UGI with SBFT to look at distal ilium in case capsule needed at some point.  DG UGI w/SBFT 12/2017: IMPRESSION: 1. Several episodes of gastroesophageal reflux, with a subtle feline esophagus appearance, but no esophageal ulceration or erosion. Very widely patent distal esophageal mucosal ring noted, not expected to cause symptoms. 2. Subtle duodenitis proximally without radiographically visible ulcer. The shallow ulcer seen on recent endoscopy were not readily apparent. 3. Normal fold pattern, caliber, and overall appearance of the remainder the small bowel. Transit time through the small bowel was 2 hours and 30 minutes, within normal limits.  DM: fastings 90-102.   9 U lantus qhs, SS novolog w/meal.  Usually has to get 3-5 units of novolog with meals, occ does not need lunch dose. Only one low glucose (70) middle of night.  He had not eaten well that evening. Eating a much more strict diabetic diet.  HTN: home measurements: low to low normal when he was going through recent GI bleed illness and recovering. As of the last 1-2 weeks they are going back up into 762U-633 systolics, diast 35K.  GI/IDA: energy up, exercising more.  No black stools.  "Stools look normal".  Past Medical History:  Diagnosis Date  . Abnormal EKG 2014   This led to stress test which was abnl, which led to cardiology referral: cath showed small vessel dz of 2nd diag branch with 70-80% stenosis--preserved LV function  . Bilateral renal cysts    Non-complex (CT 03/2016): no  enhancement, coarse calcifications, mass effect does give false appearance of hydro on non-contract series (followed by urol).  . Bladder cancer ([place]) 03/2016   Invasive high grade urothelial carcinoma.  No mets at dx.  Cystoscopy + bx and partial resection of tumor in hosp when admitted for gross hematuria and AUR.  Neoadj chemo 06/2016, then got cystoprostatectomy. (Urostomy--ileal conduit urinary diversion with refluxing anastamoses--has RLQ urostomy)-gets routine surveillance by urol (Dr. Tresa Moore), most recent 04/2017--no sign of recurrence--obs status  . CAD (coronary artery disease)    small vessel dz x 1 vessel on cath 2014: med mgmt  . Cerebellar cerebrovascular accident (CVA) without late effect 10/2017   Left, punctate: DAPT therapy x 3 wks per Dr. Leonie Man, neuro, then ASA 81mg  alone.  Stable as of 11/2017 f/u with neuro, Dr. Erlinda Hong.  . Chronic calcific pancreatitis ([place]) 03/2016   CT: also small cyst in head of pancreas that needs 6 mo f/u imaging (09/2016)  . Diabetes mellitus without complication ([place])    Insulin dependent; DKA admission 10/2017.  . GI bleed 12/2017   Presented with melena and Hb around 5.  Transfused 5 U pRBCs, started to get hematochezia in hosp: no clear explanation for GI bleeding was found on EGD or colonoscopy.  CT abd w/out acute abnormality.  GI to do upper GI w/SBFT (followed by capsule endoscopy?).  [name] Heart murmur, systolic    Noted in prior PCP's records.  I recommended echocardiogram 07/2016 but pt declined.  [name] History  of Bell's palsy   . History of chemotherapy   . Hyperlipidemia   . Hypertension   . Pancreas cyst 03/2016   CT abd/pelv 12/2017 for GI bleed w/u showed 1.6 cm pancreatic head cyst-->stable and likely benign.  Abd MRI f/u 6-12 mo recommended.  [name] Umbilical hernia    Repaired when he got his bladder surgery.    Past Surgical History:  Procedure Laterality Date  . BIOPSY  12/18/2017   Procedure: BIOPSY;  Surgeon: Doran Stabler, MD;  Location: [facility]  ENDOSCOPY;  Service: Gastroenterology;;  . CARDIAC CATHETERIZATION  02/09/2013   small vessel dz of 2nd diag branch with 70-80% stenosis--preserved LV function. Medical therapy rec'd. Agustin Cree, PA)  . CARDIOVASCULAR STRESS TEST  01/26/2013   No ischemia.  Wall motion abnormalities at inferior base suggesting small area of infarction.  EF 64%.  . CAROTID ARTERY DOPPLERS  10/15/2017   1-39% stenosis R ICA, no stenosis L ICA.  [name] CATARACT EXTRACTION    . COLONOSCOPY N/A 12/19/2017   Clotted blood in cecum w/out any underlying abnormality found -->entired colon and terminal ilium normal.  Procedure: COLONOSCOPY;  Surgeon: Doran Stabler, MD;  Location: [facility] END[facility];  Service: Gastroenterology;  Laterality: N/A;  . cystoprostatectomy w/LN surgery  10/17/2016   Ileal conduit: this is an incontinent urine diversion that directs urine from the ureters through a segment of isolated bowel to the surface of the abdominal wall via a cutaneous stoma, and urine drains continuously into an external ostomy appliance.  . CYSTOSCOPY WITH INJECTION N/A 10/17/2016   Procedure: CYSTOSCOPY WITH INJECTION OF INDOCYANINE GREEN DYE;  Surgeon: Alexis Frock, MD;  Location: [facility];  Service: Urology;  Laterality: N/A;  . ESOPHAGOGASTRODUODENOSCOPY N/A 12/18/2017   Mild chronic gastritis, no metaplasia or dysplasia.  H pylori pending as of 12/21/17.  nonbleeding duodenal ulcers.  Procedure: ESOPHAGOGASTRODUODENOSCOPY (EGD);  Surgeon: Doran Stabler, MD;  Location: [facility];  Service: Gastroenterology;  Laterality: N/A;  . EYE SURGERY     cataract surgery bilat   . IR GENERIC HISTORICAL  05/12/2016   IR US GUIDE VASC ACCESS RIGHT 05/12/2016 Greggory Keen, MD [facility]  . IR GENERIC HISTORICAL  05/12/2016   IR FLUORO GUIDE PORT INSERTION RIGHT 05/12/2016 Greggory Keen, MD [facility]  . IR REMOVAL TUN ACCESS W/ PORT W/O FL MOD SED  07/03/2017  . OSTOMY     urostomy  . TONSILLECTOMY AND ADENOIDECTOMY  Remote   . TRANSTHORACIC ECHOCARDIOGRAM  10/16/2017   EF 65-70%, normal wall motion, grd I DD.  [name] TRANSURETHRAL RESECTION OF PROSTATE N/A 04/02/2016   Procedure: CYSTOSCOPY, TRANSURETHRAL RESECTION OF BLADDER TUMOR;  Surgeon: [name], MD;  Location: [facility];  Service: Urology;  Laterality: N/A;  . UMBILICAL HERNIA REPAIR  10/2016  . VASECTOMY      Outpatient Medications Prior to Visit  Medication Sig Dispense Refill  . Continuous Blood Gluc Sensor (FREESTYLE LIBRE 14 DAY SENSOR) MISC 1 each by Does not apply route daily. 1 each 5  . insulin aspart (NOVOLOG FLEXPEN) 100 UNIT/ML FlexPen CBG < 70: implement hypoglycemia protocol CBG 70 - 120: 3 units CBG 121 - 150: 5 units CBG 151 - 200: 6 units CBG 201 - 250: 8 units CBG 251 - 300: 11 units CBG 301 - 350: 14 units CBG 351 - 400: 18 units CBG > 400: call MD 15 mL 0  . insulin glargine (LANTUS) 100 unit/mL SOPN Inject 0.09 mLs (9 Units total) into the  skin daily.    . Insulin Pen Needle (PEN NEEDLES) 32G X 4 MM MISC 1 each by Does not apply route daily. 100 each 11  . Insulin Pen Needle 31G X 5 MM MISC Inject insulin via insulin pen 3 x daily with meals 100 each 3  . pravastatin (PRAVACHOL) 20 MG tablet TAKE 1 TABLET BY MOUTH EVERY DAY 90 tablet 1  . terbinafine (LAMISIL) 250 MG tablet 1 tab po qd x 90 days 90 tablet 0  . pantoprazole (PROTONIX) 40 MG tablet TAKE 1 TABLET (40 MG TOTAL) BY MOUTH 2 (TWO) TIMES DAILY BEFORE A MEAL. 60 tablet 0  . lisinopril (PRINIVIL,ZESTRIL) 20 MG tablet Take 20 mg by mouth 2 (two) times daily.     No facility-administered medications prior to visit.     Allergies  Allergen Reactions  . Other Swelling    Pt states had swelling of the eye when having eye surgery and the anesthetic placed in eye     ROS As per HPI  LS:LHTDSKA bp today 147/72.  Manual bp recheck 130/68. Blood pressure 130/68, pulse (!) 59, temperature 98.4 F (36.9 C), temperature source Oral, resp. rate 16, height 5\' 8"  (1.727 m),  weight 209 lb 8 oz (95 kg), SpO2 98 %. Body mass index is 31.85 kg/m.  Gen: Alert, well appearing.  Patient is oriented to person, place, time, and situation. AFFECT: pleasant, lucid thought and speech. No pallor or jaundice. No further exam today.  LABS:    Chemistry      Component Value Date/Time   NA 140 12/24/2017 1349   NA 133 (A) 10/03/2017   NA 139 06/18/2017 1125   K 4.6 12/24/2017 1349   K 4.5 06/18/2017 1125   CL 108 12/24/2017 1349   CO2 25 12/24/2017 1349   CO2 27 10/02/2017   CO2 21 (L) 06/18/2017 1125   BUN 29 (H) 12/24/2017 1349   BUN 46 (A) 10/03/2017   BUN 26.5 (H) 06/18/2017 1125   CREATININE 1.25 12/24/2017 1349   CREATININE 1.2 06/18/2017 1125   GLU 525 10/03/2017      Component Value Date/Time   CALCIUM 8.9 12/24/2017 1349   CALCIUM 9.3 06/18/2017 1125   ALKPHOS 57 12/17/2017 1127   ALKPHOS 67 06/18/2017 1125   AST 11 (L) 12/17/2017 1127   AST 12 06/18/2017 1125   ALT 12 (L) 12/17/2017 1127   ALT 13 06/18/2017 1125   BILITOT 0.2 (L) 12/17/2017 1127   BILITOT 0.40 06/18/2017 1125     Lab Results  Component Value Date   WBC 7.9 12/24/2017   HGB 9.3 (L) 12/24/2017   HCT 27.7 (L) 12/24/2017   MCV 86.6 12/24/2017   PLT 364.0 12/24/2017   Lab Results  Component Value Date   HGBA1C 13.4 (H) 10/13/2017   Lab Results  Component Value Date   CHOL 94 10/16/2017   HDL 26 (L) 10/16/2017   LDLCALC 50 10/16/2017   TRIG 88 10/16/2017   CHOLHDL 3.6 10/16/2017     IMPRESSION AND PLAN:  1) DM 2:  He got 5 U pRBCs about 5 weeks ago, so HbA1c will be hard to interpret. His home glucoses are very good so I see no reason to change insulin dosing at this time. Urine microalb/cr today.  2) HTN: rising again since off med and getting completely back to baseline state of health: restart lisinopril 20mg  qd, increase to bid dosing again if bp not going to 130/80 or better in 4-5 days.  3) IDA, suspected to be secondary to duodenal ulcer: GI does not  plan further studies at this time--unless he starts bleeding again.  No signs of bleeding. He is feeling good. Continue pantoprazole 40mg  bid.  No ASA for now. Keep appt set with Dr. Loletha Carrow 02/23/18. Recheck CBC today.  An After Visit Summary was printed and given to the patient.  FOLLOW UP: Return in about 3 months (around 04/23/2018) for routine chronic illness f/u.  Signed:  Crissie Sickles, MD           01/21/2018

## 2018-01-22 LAB — BASIC METABOLIC PANEL
BUN: 25 mg/dL — ABNORMAL HIGH (ref 6–23)
CO2: 25 mEq/L (ref 19–32)
Calcium: 8.7 mg/dL (ref 8.4–10.5)
Chloride: 109 mEq/L (ref 96–112)
Creatinine, Ser: 1.24 mg/dL (ref 0.40–1.50)
GFR: 59.57 mL/min — ABNORMAL LOW (ref >60.00)
Glucose, Bld: 79 mg/dL (ref 70–99)
Potassium: 4.1 mEq/L (ref 3.5–5.1)
Sodium: 143 mEq/L (ref 135–145)

## 2018-01-22 LAB — CBC
HCT: 26.9 % — ABNORMAL LOW (ref 39.0–52.0)
Hemoglobin: 8.6 g/dL — ABNORMAL LOW (ref 13.0–17.0)
MCHC: 31.9 g/dL (ref 30.0–36.0)
MCV: 80.7 fl (ref 78.0–100.0)
Platelets: 314 10*3/uL (ref 150.0–400.0)
RBC: 3.33 Mil/uL — ABNORMAL LOW (ref 4.22–5.81)
RDW: 19.8 % — ABNORMAL HIGH (ref 11.5–15.5)
WBC: 6 10*3/uL (ref 4.0–10.5)

## 2018-01-22 LAB — HEMOGLOBIN A1C: Hgb A1c MFr Bld: 6.5 % (ref 4.6–6.5)

## 2018-01-25 ENCOUNTER — Other Ambulatory Visit: Payer: Self-pay | Admitting: *Deleted

## 2018-01-25 DIAGNOSIS — D509 Iron deficiency anemia, unspecified: Secondary | ICD-10-CM

## 2018-02-04 ENCOUNTER — Other Ambulatory Visit: Payer: Self-pay | Admitting: *Deleted

## 2018-02-04 MED ORDER — INSULIN ASPART 100 UNIT/ML FLEXPEN: PEN_INJECTOR | SUBCUTANEOUS | 6 refills | Status: AC

## 2018-02-04 NOTE — Telephone Encounter (Signed)
Looks like this is a new medication that has not been filled by [place]. Please advise. Thanks.

## 2018-02-04 NOTE — Telephone Encounter (Signed)
I authorized RFs.

## 2018-02-23 ENCOUNTER — Encounter: Payer: Self-pay | Admitting: Gastroenterology

## 2018-02-23 ENCOUNTER — Ambulatory Visit (INDEPENDENT_AMBULATORY_CARE_PROVIDER_SITE_OTHER): Payer: Medicare Other | Admitting: Gastroenterology

## 2018-02-23 VITALS — BP 132/70 | HR 62 | Ht 67.0 in | Wt 211.5 lb

## 2018-02-23 DIAGNOSIS — K269 Duodenal ulcer, unspecified as acute or chronic, without hemorrhage or perforation: Secondary | ICD-10-CM | POA: Diagnosis not present

## 2018-02-23 DIAGNOSIS — I6330 Cerebral infarction due to thrombosis of unspecified cerebral artery: Secondary | ICD-10-CM | POA: Diagnosis not present

## 2018-02-23 DIAGNOSIS — D500 Iron deficiency anemia secondary to blood loss (chronic): Secondary | ICD-10-CM | POA: Diagnosis not present

## 2018-02-23 NOTE — Progress Notes (Signed)
Shane [name]  Chief Complaint: Melena and acute blood loss anemia  Subjective  History:  Follow-up follows up after his June hospital stay and subsequent visit with our APP.  He had melena with severe anemia.  Duodenal ulcer was found on upper endoscopy, but I was not certain it was the source of bleeding.  Colonoscopy did not find a bleeding source.  CT abdomen was done to rule out a small bowel mass, especially given his and his wife's concern about his previous bladder cancer diagnosis that required an ileal conduit.  He has had no further overt GI bleeding since that hospitalization. Shane [name] reports that he has been feeling well lately, and his wife confirms this.  He saw primary care several weeks ago and was still noted to be anemic, so was prescribed iron.  He only picked it up a few days ago.  He had not been on iron after the hospital discharge.  He denies abdominal pain, his appetite is good and weight stable.  He has had no black or bloody stools since hospitalization.   ROS: Cardiovascular:  no chest pain Respiratory: no dyspnea  The patient's Past Medical, Family and Social History were reviewed and are on file in the EMR.  Objective:  Med list reviewed  Current Outpatient Medications:  .  Continuous Blood Gluc Sensor (FREESTYLE LIBRE 14 DAY SENSOR) MISC, 1 each by Does not apply route daily., Disp: 1 each, Rfl: 5 .  insulin aspart (NOVOLOG FLEXPEN) 100 UNIT/ML FlexPen, CBG < 70: implement hypoglycemia protocol CBG 70 - 120: 3 units CBG 121 - 150: 5 units CBG 151 - 200: 6 units CBG 201 - 250: 8 units CBG 251 - 300: 11 units CBG 301 - 350: 14 units CBG 351 - 400: 18 units CBG > 400: call MD, Disp: 15 mL, Rfl: 6 .  insulin glargine (LANTUS) 100 unit/mL SOPN, Inject 0.09 mLs (9 Units total) into the skin daily., Disp: , Rfl:  .  Insulin Pen Needle (PEN NEEDLES) 32G X 4 MM MISC, 1 each by Does not apply route daily., Disp: 100 each, Rfl: 11 .  Insulin Pen  Needle 31G X 5 MM MISC, Inject insulin via insulin pen 3 x daily with meals, Disp: 100 each, Rfl: 3 .  Lancets 30G MISC, Use to check blood sugar 4 times daily, Disp: 100 each, Rfl: 0 .  pantoprazole (PROTONIX) 40 MG tablet, Take 1 tablet (40 mg total) by mouth 2 (two) times daily before a meal., Disp: 180 tablet, Rfl: 0 .  pravastatin (PRAVACHOL) 20 MG tablet, TAKE 1 TABLET BY MOUTH EVERY DAY, Disp: 90 tablet, Rfl: 1 .  terbinafine (LAMISIL) 250 MG tablet, 1 tab po qd x 90 days, Disp: 90 tablet, Rfl: 0 .  lisinopril (PRINIVIL,ZESTRIL) 20 MG tablet, Take 20 mg by mouth 2 (two) times daily., Disp: , Rfl:    Vital signs in last 24 hrs: Vitals:   02/23/18 1044  BP: 132/70  Pulse: 62    Physical Exam  Well-appearing, wife Shane [name] with him today  HEENT: sclera anicteric, oral mucosa moist without lesions  Neck: supple, no thyromegaly, JVD or lymphadenopathy  Cardiac: RRR without murmurs, S1S2 heard, no peripheral edema  Pulm: clear to auscultation bilaterally, normal RR and effort noted  Abdomen: soft, no tenderness, with active bowel sounds. No guarding or palpable hepatosplenomegaly.  Ileal conduit     skin; warm and dry, no jaundice or rash  Recent Labs:  CBC Latest  Ref Rng & Units 01/21/2018 12/24/2017 12/20/2017  WBC 4.0 - 10.5 K/uL 6.0 7.9 6.6  Hemoglobin 13.0 - 17.0 g/dL 8.6 Repeated and verified X2.(L) 9.3(L) 8.7(L)  Hematocrit 39.0 - 52.0 % 26.9(L) 27.7(L) 26.6(L)  Platelets 150.0 - 400.0 K/uL 314.0 364.0 368     Radiologic studies:  UGIS/SBFT 01/06/2018 showed mild reflux, subtle proximal duodenal thickening, no apparent small bowel stricture.  @ASSESSMENTPLANBEGIN @ Assessment: Encounter Diagnoses  Name Primary?  . Chronic blood loss anemia Yes  . Duodenal ulcer    He had several shallow duodenal ulcers that were probably aspirin induced.  He was taking aspirin daily up until hospital admission, and his wife says he would take additional aspirin as his usual pain  reliever.  He has not taken any further aspirin or any NSAIDs since hospital.  I expect the duodenal ulcers while it healed at this point.  I do not know what more distal small bowel source may have caused his bleeding, perhaps an AVM or ulcer as well.  He has had no overt GI bleeding since then, and I think his persistent anemia is just because he has not been on iron until a few days ago.  I think his anemia never recovered from the initial severe overt acute GI blood loss or his hemoglobin got to 4.9.   Plan: Continue iron follow-up with primary care to check blood counts and iron levels. If he is on adequate supplementation and is iron levels and hemoglobin did not normalize, and if additional sources of anemia are ruled out such as B84 and folic acid deficiency, he should be referred back to see me for consideration of repeat upper endoscopy and, if normal, perhaps small bowel video capsule study. Decrease PPI to once daily for a week, then every other day for a week, then stop Avoid aspirin and NSAIDs indefinitely   He will otherwise see me as needed.  Total time 20 minutes, over half spent face-to-face with patient in counseling and coordination of care.   Nelida Meuse III

## 2018-02-23 NOTE — Patient Instructions (Addendum)
If you are age 80 or older, your body mass index should be between 23-30. Your Body mass index is 33.13 kg/m. If this is out of the aforementioned range listed, please consider follow up with your Primary Care Provider.  If you are age 47 or younger, your body mass index should be between 19-25. Your Body mass index is 33.13 kg/m. If this is out of the aformentioned range listed, please consider follow up with your Primary Care Provider.   Please decrease the pantoprazole to once a day for a week. Then decrease to one every other day for a week. Then stop.   It was a pleasure to see you today!  Dr. Loletha Carrow

## 2018-02-24 ENCOUNTER — Encounter: Payer: Self-pay | Admitting: Family Medicine

## 2018-03-18 ENCOUNTER — Other Ambulatory Visit: Payer: Self-pay | Admitting: *Deleted

## 2018-03-18 MED ORDER — LISINOPRIL 20 MG PO TABS: 20.0000 mg | ORAL_TABLET | Freq: Two times a day (BID) | ORAL | 5 refills | Status: DC

## 2018-04-16 ENCOUNTER — Other Ambulatory Visit: Payer: Self-pay | Admitting: Family Medicine

## 2018-04-23 ENCOUNTER — Ambulatory Visit (INDEPENDENT_AMBULATORY_CARE_PROVIDER_SITE_OTHER): Payer: Medicare Other | Admitting: Family Medicine

## 2018-04-23 ENCOUNTER — Encounter: Payer: Self-pay | Admitting: Family Medicine

## 2018-04-23 VITALS — BP 132/76 | HR 77 | Temp 98.0°F | Resp 16 | Ht 67.0 in | Wt 216.0 lb

## 2018-04-23 DIAGNOSIS — E118 Type 2 diabetes mellitus with unspecified complications: Secondary | ICD-10-CM

## 2018-04-23 DIAGNOSIS — D500 Iron deficiency anemia secondary to blood loss (chronic): Secondary | ICD-10-CM

## 2018-04-23 DIAGNOSIS — I6330 Cerebral infarction due to thrombosis of unspecified cerebral artery: Secondary | ICD-10-CM

## 2018-04-23 DIAGNOSIS — I10 Essential (primary) hypertension: Secondary | ICD-10-CM | POA: Diagnosis not present

## 2018-04-23 MED ORDER — FREESTYLE LIBRE 14 DAY SENSOR MISC: 1.0000 | Freq: Every day | 12 refills | Status: DC

## 2018-04-23 NOTE — Progress Notes (Signed)
OFFICE VISIT  04/23/2018   CC:  Chief Complaint  Patient presents with  . Follow-up    RCI, pt is not fasting.    HPI:    Patient is a 82 y.o. Caucasian male who presents for 3 mo f/u DM 2, HTN, and IDA.  Last visit his Hb was not as far up as I felt it should be so I asked him to do some more hemoccult cards but he did not do this.   He chose not to do the stool cards but he did start 1 otc ferrous sulfate 325mg  qd. He has weened himself off of PPI. Stool is brown, no constipation or melena or diarrhea. Energy level is good.  Does a lot of hiking. Recently strained his L knee when kicking a soccer ball.  This is almost completely back to baseline now, though. Says breathing is normal.   DM: Lantus hs + novolog sliding scale.  Pt says glucoses normal for the most part. Some gluc's in the 60s but no hypoglycemic sx's. No burning or tingling or numbness in feet.   HTN: he restarted lisinopril 20mg  bid, now home bp's consistently avg 130/80.  ROS: no CP, no SOB, no wheezing, no cough, no dizziness, no HAs, no rashes, no melena/hematochezia.  No polyuria or polydipsia.  No myalgias or arthralgias.   Past Medical History:  Diagnosis Date  . Abnormal EKG 2014   This led to stress test which was abnl, which led to cardiology referral: cath showed small vessel dz of 2nd diag branch with 70-80% stenosis--preserved LV function  . Bilateral renal cysts    Non-complex (CT 03/2016): no enhancement, coarse calcifications, mass effect does give false appearance of hydro on non-contract series (followed by urol).  . Bladder cancer ([place]) 03/2016   Invasive high grade urothelial carcinoma.  No mets at dx.  Cystoscopy + bx and partial resection of tumor in hosp when admitted for gross hematuria and AUR.  Neoadj chemo 06/2016, then got cystoprostatectomy. (Urostomy--ileal conduit urinary diversion with refluxing anastamoses--has RLQ urostomy)-gets routine surveillance by urol (Dr. Tresa Moore), most  recent 04/2017--no sign of recurrence--obs status  . CAD (coronary artery disease)    small vessel dz x 1 vessel on cath 2014: med mgmt  . Cerebellar cerebrovascular accident (CVA) without late effect 10/2017   Left, punctate: DAPT therapy x 3 wks per Dr. Leonie Man, neuro, then ASA 81mg  alone.  Stable as of 11/2017 f/u with neuro, Dr. Erlinda Hong.  . Chronic calcific pancreatitis ([place]) 03/2016   CT: also small cyst in head of pancreas that needs 6 mo f/u imaging (09/2016)  . Diabetes mellitus without complication ([place])    Insulin dependent; DKA admission 10/2017.  . GI bleed 12/2017   Presented with melena and Hb around 5.  Transfused 5 U pRBCs, started to get hematochezia in hosp: no clear explanation for GI bleeding was found on EGD or colonoscopy.  CT abd w/out acute abnormality.  Upper GI w/SBFT showed subtle duodenal erosion.  [name] Heart murmur, systolic    Noted in prior PCP's records.  I recommended echocardiogram 07/2016 but pt declined.  [name] History of Bell's palsy   . History of chemotherapy   . Hyperlipidemia   . Hypertension   . IDA (iron deficiency anemia) 12/2017   GI bleed  . Pancreas cyst 03/2016   CT abd/pelv 12/2017 for GI bleed w/u showed 1.6 cm pancreatic head cyst-->stable and likely benign.  Abd MRI f/u 6-12 mo recommended.  [name] Umbilical hernia  Repaired when he got his bladder surgery.    Past Surgical History:  Procedure Laterality Date  . BIOPSY  12/18/2017   Procedure: BIOPSY;  Surgeon: Doran Stabler, MD;  Location: [facility];  Service: Gastroenterology;;  . CARDIAC CATHETERIZATION  02/09/2013   small vessel dz of 2nd diag branch with 70-80% stenosis--preserved LV function. Medical therapy rec'd. Agustin Cree, PA)  . CARDIOVASCULAR STRESS TEST  01/26/2013   No ischemia.  Wall motion abnormalities at inferior base suggesting small area of infarction.  EF 64%.  . CAROTID ARTERY DOPPLERS  10/15/2017   1-39% stenosis R ICA, no stenosis L ICA.  [name] CATARACT EXTRACTION    .  COLONOSCOPY N/A 12/19/2017   Clotted blood in cecum w/out any underlying abnormality found -->entired colon and terminal ilium normal.  Procedure: COLONOSCOPY;  Surgeon: Doran Stabler, MD;  Location: [facility];  Service: Gastroenterology;  Laterality: N/A;  . cystoprostatectomy w/LN surgery  10/17/2016   Ileal conduit: this is an incontinent urine diversion that directs urine from the ureters through a segment of isolated bowel to the surface of the abdominal wall via a cutaneous stoma, and urine drains continuously into an external ostomy appliance.  . CYSTOSCOPY WITH INJECTION N/A 10/17/2016   Procedure: CYSTOSCOPY WITH INJECTION OF INDOCYANINE GREEN DYE;  Surgeon: Alexis Frock, MD;  Location: [facility];  Service: Urology;  Laterality: N/A;  . ESOPHAGOGASTRODUODENOSCOPY N/A 12/18/2017   Mild chronic gastritis, no metaplasia or dysplasia.  H pylori pending as of 12/21/17.  nonbleeding duodenal ulcers.  Procedure: ESOPHAGOGASTRODUODENOSCOPY (EGD);  Surgeon: Doran Stabler, MD;  Location: [facility];  Service: Gastroenterology;  Laterality: N/A;  . EYE SURGERY     cataract surgery bilat   . IR GENERIC HISTORICAL  05/12/2016   IR US GUIDE VASC ACCESS RIGHT 05/12/2016 Greggory Keen, MD [facility]  . IR GENERIC HISTORICAL  05/12/2016   IR FLUORO GUIDE PORT INSERTION RIGHT 05/12/2016 Greggory Keen, MD [facility]  . IR REMOVAL TUN ACCESS W/ PORT W/O FL MOD SED  07/03/2017  . OSTOMY     urostomy  . TONSILLECTOMY AND ADENOIDECTOMY  Remote  . TRANSTHORACIC ECHOCARDIOGRAM  10/16/2017   EF 65-70%, normal wall motion, grd I DD.  [name] TRANSURETHRAL RESECTION OF PROSTATE N/A 04/02/2016   Procedure: CYSTOSCOPY, TRANSURETHRAL RESECTION OF BLADDER TUMOR;  Surgeon: [name], MD;  Location: [facility];  Service: Urology;  Laterality: N/A;  . UMBILICAL HERNIA REPAIR  10/2016  . VASECTOMY      Outpatient Medications Prior to Visit  Medication Sig Dispense Refill  . insulin aspart (NOVOLOG FLEXPEN)  100 UNIT/ML FlexPen CBG < 70: implement hypoglycemia protocol CBG 70 - 120: 3 units CBG 121 - 150: 5 units CBG 151 - 200: 6 units CBG 201 - 250: 8 units CBG 251 - 300: 11 units CBG 301 - 350: 14 units CBG 351 - 400: 18 units CBG > 400: call MD 15 mL 6  . insulin glargine (LANTUS) 100 unit/mL SOPN Inject 0.09 mLs (9 Units total) into the skin daily.    . Insulin Pen Needle (PEN NEEDLES) 32G X 4 MM MISC 1 each by Does not apply route daily. 100 each 11  . Insulin Pen Needle 31G X 5 MM MISC Inject insulin via insulin pen 3 x daily with meals 100 each 3  . Lancets 30G MISC Use to check blood sugar 4 times daily 100 each 0  . lisinopril (PRINIVIL,ZESTRIL) 20 MG tablet Take 1 tablet (20 mg  total) by mouth 2 (two) times daily. 60 tablet 5  . pantoprazole (PROTONIX) 40 MG tablet TAKE 1 TABLET BY MOUTH TWICE A DAY BEFORE A MEAL. 180 tablet 0  . pravastatin (PRAVACHOL) 20 MG tablet TAKE 1 TABLET BY MOUTH EVERY DAY 90 tablet 1  . terbinafine (LAMISIL) 250 MG tablet 1 tab po qd x 90 days 90 tablet 0  . Continuous Blood Gluc Sensor (FREESTYLE LIBRE 14 DAY SENSOR) MISC 1 each by Does not apply route daily. 1 each 5   No facility-administered medications prior to visit.     Allergies  Allergen Reactions  . Other Swelling    Pt states had swelling of the eye when having eye surgery and the anesthetic placed in eye     ROS As per HPI  PE: Blood pressure (!) 149/67, pulse 77, temperature 98 F (36.7 C), temperature source Oral, resp. rate 16, height 5\' 7"  (1.702 m), weight 216 lb (98 kg), SpO2 99 %. Gen: Alert, well appearing.  Patient is oriented to person, place, time, and situation. AFFECT: pleasant, lucid thought and speech. CV: RRR, some ectopic beats noted. Chest is clear, no wheezing or rales. Normal symmetric air entry throughout both lung fields. No chest wall deformities or tenderness. Foot exam -  no swelling, tenderness or skin or vascular lesions. Color and temperature is normal.  Sensation is intact. Peripheral pulses are palpable. Toenails are hypertrophic +white matter underneath all nails.   LABS:  Lab Results  Component Value Date   TSH 1.084 10/14/2017   Lab Results  Component Value Date   WBC 6.0 01/21/2018   HGB 8.6 Repeated and verified X2. (L) 01/21/2018   HCT 26.9 (L) 01/21/2018   MCV 80.7 01/21/2018   PLT 314.0 01/21/2018   Lab Results  Component Value Date   CREATININE 1.24 01/21/2018   BUN 25 (H) 01/21/2018   NA 143 01/21/2018   K 4.1 01/21/2018   CL 109 01/21/2018   CO2 25 01/21/2018   Lab Results  Component Value Date   ALT 12 (L) 12/17/2017   AST 11 (L) 12/17/2017   ALKPHOS 57 12/17/2017   BILITOT 0.2 (L) 12/17/2017   Lab Results  Component Value Date   CHOL 94 10/16/2017   Lab Results  Component Value Date   HDL 26 (L) 10/16/2017   Lab Results  Component Value Date   LDLCALC 50 10/16/2017   Lab Results  Component Value Date   TRIG 88 10/16/2017   Lab Results  Component Value Date   CHOLHDL 3.6 10/16/2017     IMPRESSION AND PLAN:  1) DM: control is good. Feet exam normal except hypertrophic/onychomycotic nails. Offered podiatry referral but he declined. Hba1c today. Lytes/cr today.  2) HTN: The current medical regimen is effective;  continue present plan and medications. Lytes/cr today.  3) IDA: suspected to be from duodenal ulcer.  He is s/p transfusion 12/2017. No sign of bleeding.  He has weened himself off PPI. He has been on FeSO4 325mg  qd x 3 mo. Needs recheck Hb today.  An After Visit Summary was printed and given to the patient.  FOLLOW UP: 3 mo RCI  Signed:  Crissie Sickles, MD           04/26/2018

## 2018-04-24 LAB — BASIC METABOLIC PANEL
BUN/Creatinine Ratio: 19 (calc) (ref 6–22)
BUN: 28 mg/dL — ABNORMAL HIGH (ref 7–25)
CO2: 24 mmol/L (ref 20–32)
Calcium: 9.3 mg/dL (ref 8.6–10.3)
Chloride: 107 mmol/L (ref 98–110)
Creat: 1.49 mg/dL — ABNORMAL HIGH (ref 0.70–1.11)
Glucose, Bld: 121 mg/dL — ABNORMAL HIGH (ref 65–99)
Potassium: 4.7 mmol/L (ref 3.5–5.3)
Sodium: 140 mmol/L (ref 135–146)

## 2018-04-24 LAB — CBC
HCT: 38.6 % (ref 38.5–50.0)
Hemoglobin: 12.5 g/dL — ABNORMAL LOW (ref 13.2–17.1)
MCH: 25.8 pg — ABNORMAL LOW (ref 27.0–33.0)
MCHC: 32.4 g/dL (ref 32.0–36.0)
MCV: 79.8 fL — ABNORMAL LOW (ref 80.0–100.0)
MPV: 9.7 fL (ref 7.5–12.5)
Platelets: 247 10*3/uL (ref 140–400)
RBC: 4.84 10*6/uL (ref 4.20–5.80)
RDW: 22.1 % — ABNORMAL HIGH (ref 11.0–15.0)
WBC: 6.1 10*3/uL (ref 3.8–10.8)

## 2018-04-24 LAB — HEMOGLOBIN A1C
Hgb A1c MFr Bld: 6.1 % of total Hgb — ABNORMAL HIGH (ref <5.7)
Mean Plasma Glucose: 128 (calc)
eAG (mmol/L): 7.1 (calc)

## 2018-04-25 ENCOUNTER — Encounter: Payer: Self-pay | Admitting: Family Medicine

## 2018-04-26 ENCOUNTER — Other Ambulatory Visit: Payer: Self-pay | Admitting: Family Medicine

## 2018-04-26 DIAGNOSIS — R748 Abnormal levels of other serum enzymes: Secondary | ICD-10-CM

## 2018-04-29 ENCOUNTER — Other Ambulatory Visit: Payer: Self-pay | Admitting: Family Medicine

## 2018-05-15 IMAGING — CT CT HEAD W/O CM
3 series · 15 of 46 positions shown, 18 images · non-contrast
Comparison: No prior brain imaging.

CLINICAL DATA: 82-year-old male with recent increased weakness and
thirst. Hyperglycemia. Altered mental status.

EXAM:
CT HEAD WITHOUT CONTRAST
TECHNIQUE: Contiguous axial images were obtained from the base of the skull
through the vertex without intravenous contrast.

[Series 2: head wo · axial · 0.47mm/px · z∈[-139,-19]mm · 9 of 29 slices shown, 12 images]
[im 3/29  brain]
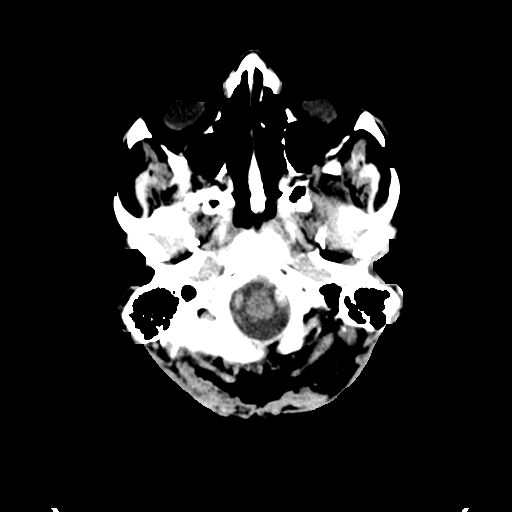
[im 3/29  bone]
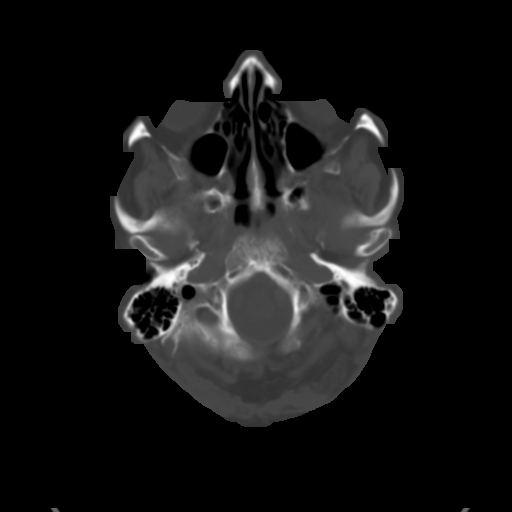
[im 6/29  brain]
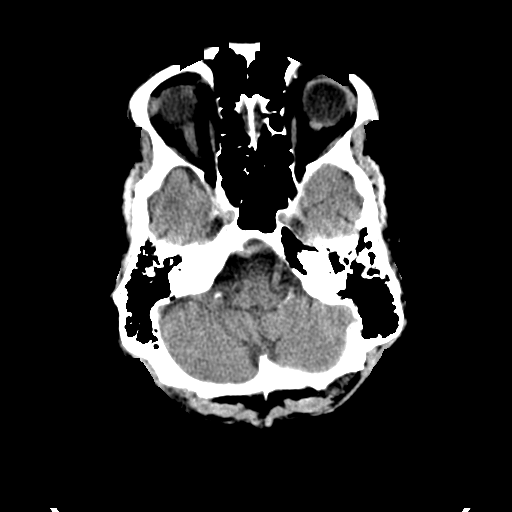
[im 9/29  brain]
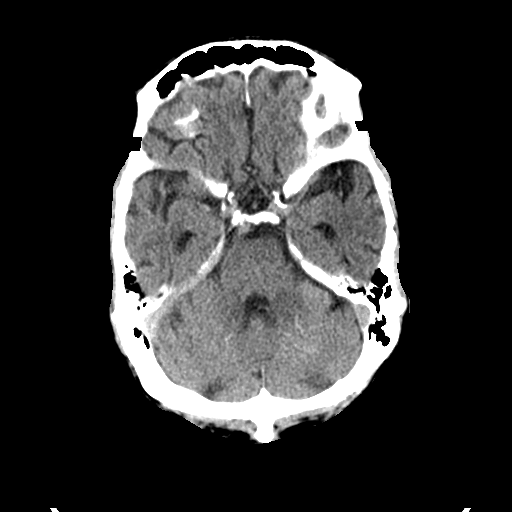
[im 12/29  brain]
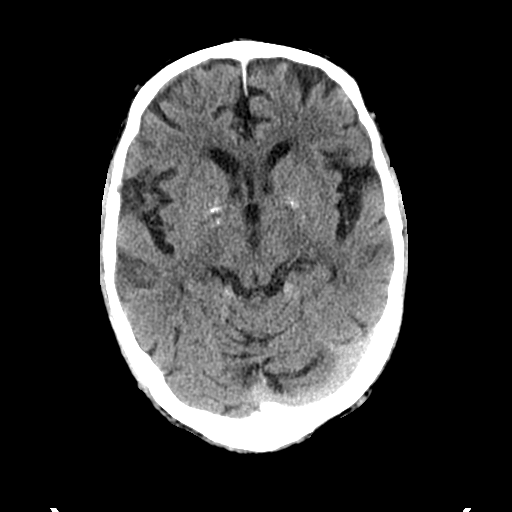
[im 15/29  brain]
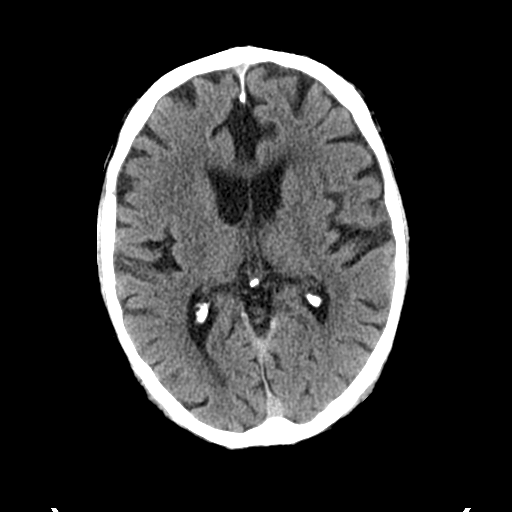
[im 15/29  bone]
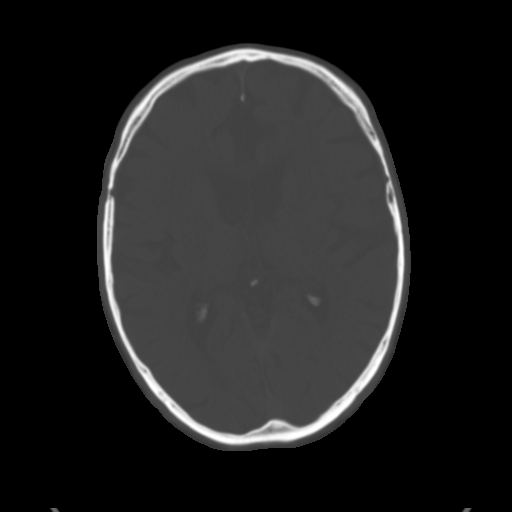
[im 18/29  brain]
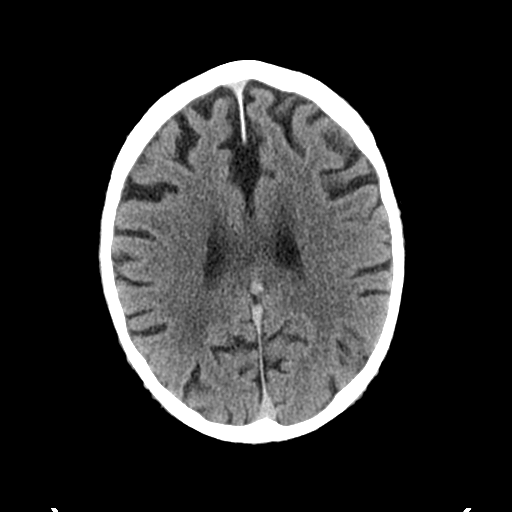
[im 21/29  brain]
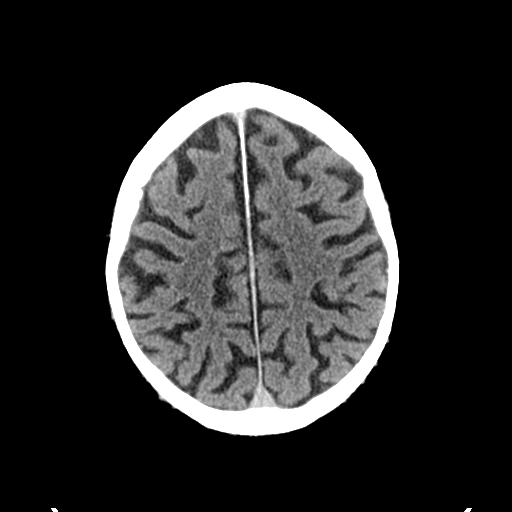
[im 24/29  brain]
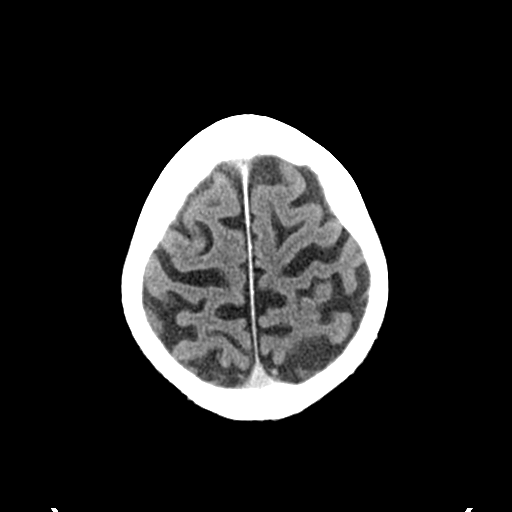
[im 27/29  brain]
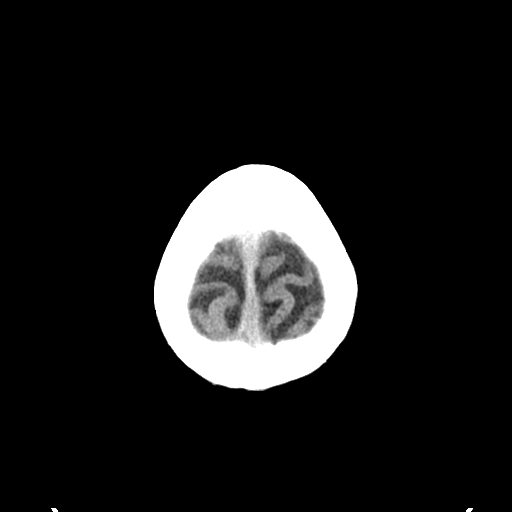
[im 27/29  bone]
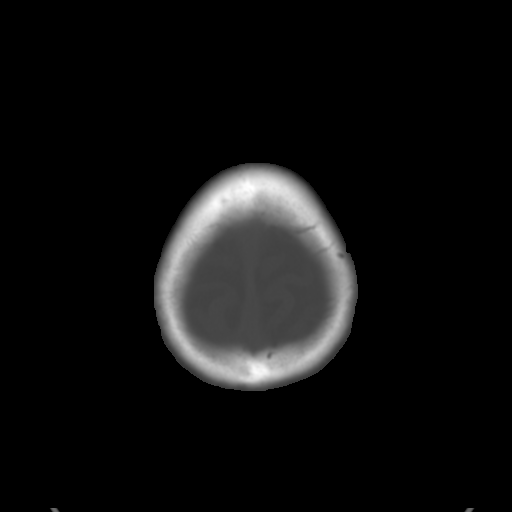

[Series 5: coronal soft tissue · coronal · 0.29mm/px · 3 of 71 slices shown]
[im 24/71  brain]
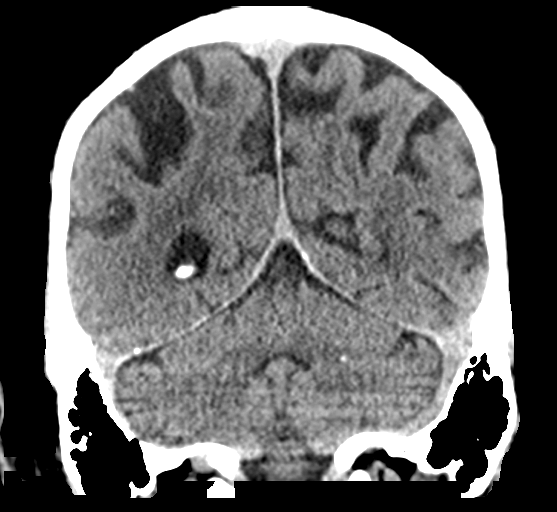
[im 32/71  brain]
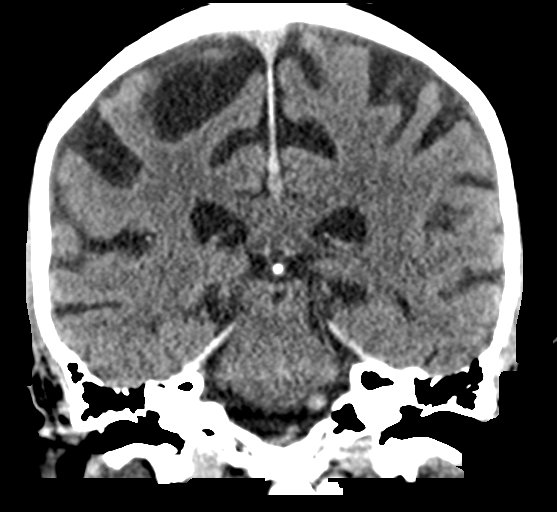
[im 39/71  brain]
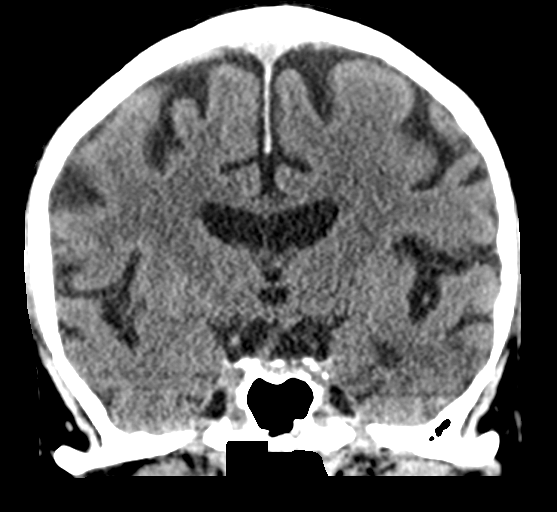

[Series 6: sagittal soft tissue · sagittal · 0.28mm/px · 3 of 58 slices shown]
[im 20/58  brain]
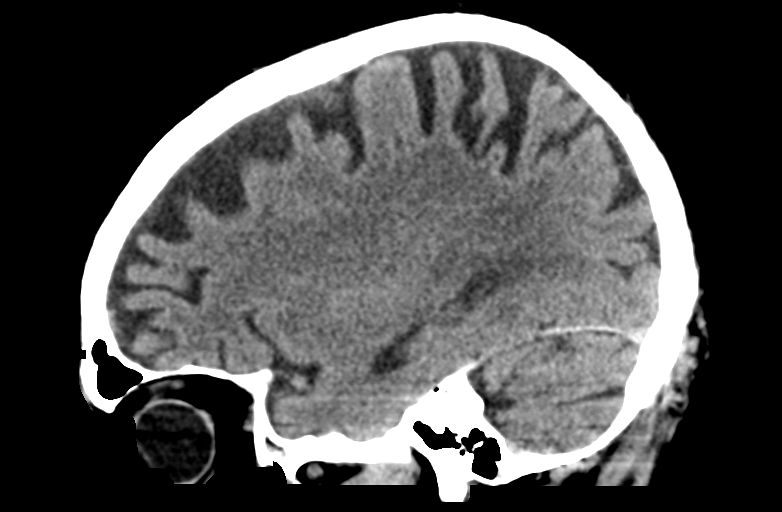
[im 29/58  brain]
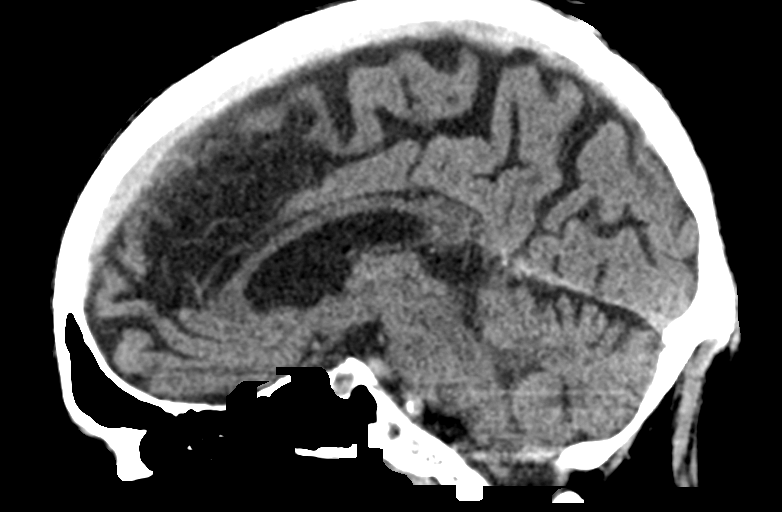
[im 39/58  brain]
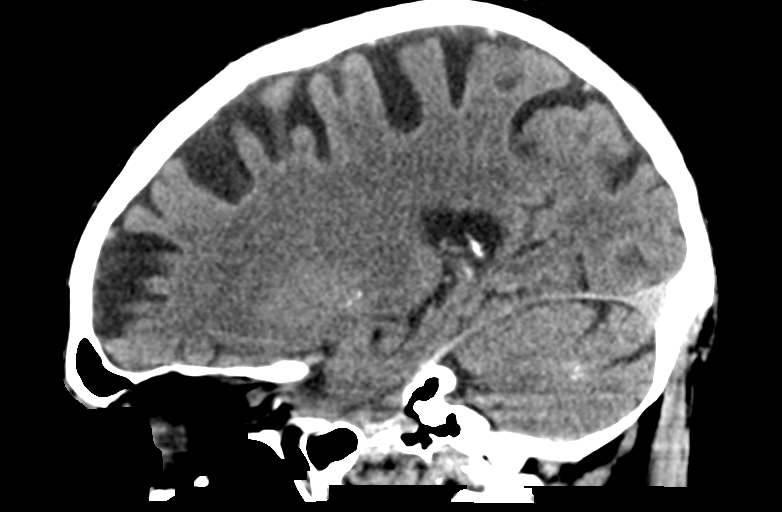

[15 of 46 positions shown; findings below may reference images not displayed]

FINDINGS: Brain: Mild dystrophic calcifications in the bilateral globus
pallidus and deep cerebellar nuclei. Cerebral volume is within
normal limits for age. Mild dural calcifications. No midline shift,
ventriculomegaly, mass effect, evidence of mass lesion, intracranial
hemorrhage or evidence of cortically based acute infarction.
Gray-white matter differentiation is within normal limits throughout
the brain. No cortical encephalomalacia.

Vascular: Calcified atherosclerosis at the skull base. No suspicious
intracranial vascular hyperdensity. There is intracranial artery
tortuosity, including the dominant appearing distal left vertebral
artery.

Skull: No acute osseous abnormality identified.

Sinuses/Orbits: Hyperplastic and well pneumatized.

Other: No acute orbit or scalp soft tissue finding.
IMPRESSION: No acute intracranial abnormality. Normal for age non contrast CT
appearance of the brain.

## 2018-07-21 ENCOUNTER — Other Ambulatory Visit: Payer: Self-pay | Admitting: Family Medicine

## 2018-07-28 ENCOUNTER — Ambulatory Visit (INDEPENDENT_AMBULATORY_CARE_PROVIDER_SITE_OTHER): Payer: Medicare Other | Admitting: Family Medicine

## 2018-07-28 ENCOUNTER — Encounter: Payer: Self-pay | Admitting: Family Medicine

## 2018-07-28 VITALS — BP 113/61 | HR 54 | Temp 97.8°F | Resp 16 | Ht 67.0 in | Wt 204.5 lb

## 2018-07-28 DIAGNOSIS — I10 Essential (primary) hypertension: Secondary | ICD-10-CM

## 2018-07-28 DIAGNOSIS — E7800 Pure hypercholesterolemia, unspecified: Secondary | ICD-10-CM

## 2018-07-28 DIAGNOSIS — R7989 Other specified abnormal findings of blood chemistry: Secondary | ICD-10-CM

## 2018-07-28 DIAGNOSIS — E669 Obesity, unspecified: Secondary | ICD-10-CM

## 2018-07-28 DIAGNOSIS — E119 Type 2 diabetes mellitus without complications: Secondary | ICD-10-CM | POA: Diagnosis not present

## 2018-07-28 DIAGNOSIS — D500 Iron deficiency anemia secondary to blood loss (chronic): Secondary | ICD-10-CM | POA: Diagnosis not present

## 2018-07-28 LAB — CBC
HCT: 38.6 % — ABNORMAL LOW (ref 39.0–52.0)
Hemoglobin: 12.8 g/dL — ABNORMAL LOW (ref 13.0–17.0)
MCHC: 33.1 g/dL (ref 30.0–36.0)
MCV: 89.7 fl (ref 78.0–100.0)
Platelets: 172 10*3/uL (ref 150.0–400.0)
RBC: 4.3 Mil/uL (ref 4.22–5.81)
RDW: 15.7 % — ABNORMAL HIGH (ref 11.5–15.5)
WBC: 5.9 10*3/uL (ref 4.0–10.5)

## 2018-07-28 LAB — BASIC METABOLIC PANEL
BUN: 27 mg/dL — ABNORMAL HIGH (ref 6–23)
CO2: 22 mEq/L (ref 19–32)
Calcium: 9 mg/dL (ref 8.4–10.5)
Chloride: 109 mEq/L (ref 96–112)
Creatinine, Ser: 1.23 mg/dL (ref 0.40–1.50)
GFR: 60.05 mL/min (ref >60.00)
Glucose, Bld: 105 mg/dL — ABNORMAL HIGH (ref 70–99)
Potassium: 4.4 mEq/L (ref 3.5–5.1)
Sodium: 138 mEq/L (ref 135–145)

## 2018-07-28 LAB — HEMOGLOBIN A1C: Hgb A1c MFr Bld: 7 % — ABNORMAL HIGH (ref 4.6–6.5)

## 2018-07-28 NOTE — Progress Notes (Signed)
OFFICE VISIT  07/28/2018   CC:  Chief Complaint  Patient presents with  . Follow-up    RCI, pt is not fasting.     HPI:    Patient is a 83 y.o. Caucasian male who presents for 3 mo f/u DM 2, HTN, HLD. Also need to recheck Hb to monitor for complete resolution of his IDA that was secondary to UGI bleed June 2019. He has remained on iron, 1 pill a day. His sCr was up over baseline at last lab check 04/23/18.  I recommended increased water intake and recheck of BMET 10d later but he never came in for this testing. He tells me today that he drinks a lot of water throughout his day.  Not much soda, and diet if he does.  DM: pt states glucoses normal consistently.  Checks glucoses mult times a day with libre sensor. Takes anywhere from 4-6 U of bedtime insulin, mealtime insulin avg 3-4 units. Uses sliding scale well.  HTN: consistently normal bp's  HLD: tolerating pravastatin well.  ROS: no CP, no SOB, no wheezing, no cough, no dizziness, no HAs, no rashes, no melena/hematochezia.  No polyuria or polydipsia.  No myalgias or arthralgias.   Past Medical History:  Diagnosis Date  . Abnormal EKG 2014   This led to stress test which was abnl, which led to cardiology referral: cath showed small vessel dz of 2nd diag branch with 70-80% stenosis--preserved LV function  . Bilateral renal cysts    Non-complex (CT 03/2016): no enhancement, coarse calcifications, mass effect does give false appearance of hydro on non-contract series (followed by urol).  . Bladder cancer ([place]) 03/2016   Invasive high grade urothelial carcinoma.  No mets at dx.  Cystoscopy + bx and partial resection of tumor in hosp when admitted for gross hematuria and AUR.  Neoadj chemo 06/2016, then got cystoprostatectomy. (Urostomy--ileal conduit urinary diversion with refluxing anastamoses--has RLQ urostomy)-gets routine surveillance by urol (Dr. Tresa Moore), most recent 04/2017--no sign of recurrence--obs status  . CAD (coronary  artery disease)    small vessel dz x 1 vessel on cath 2014: med mgmt  . Cerebellar cerebrovascular accident (CVA) without late effect 10/2017   Left, punctate: DAPT therapy x 3 wks per Dr. Leonie Man, neuro, then ASA 81mg  alone.  Stable as of 11/2017 f/u with neuro, Dr. Erlinda Hong.  . Chronic calcific pancreatitis ([place]) 03/2016   CT: also small cyst in head of pancreas that needs 6 mo f/u imaging (09/2016)  . Diabetes mellitus without complication ([place])    Insulin dependent; DKA admission 10/2017.  . GI bleed 12/2017   Presented with melena and Hb around 5.  Transfused 5 U pRBCs, started to get hematochezia in hosp: no clear explanation for GI bleeding was found on EGD or colonoscopy.  CT abd w/out acute abnormality.  Upper GI w/SBFT showed subtle duodenal erosion.  [name] Heart murmur, systolic    Noted in prior PCP's records.  I recommended echocardiogram 07/2016 but pt declined.  [name] History of Bell's palsy   . History of chemotherapy   . Hyperlipidemia   . Hypertension   . IDA (iron deficiency anemia) 12/2017   GI bleed  . Pancreas cyst 03/2016   CT abd/pelv 12/2017 for GI bleed w/u showed 1.6 cm pancreatic head cyst-->stable and likely benign.  Abd MRI f/u 6-12 mo recommended.  [name] Umbilical hernia    Repaired when he got his bladder surgery.    Past Surgical History:  Procedure Laterality Date  . BIOPSY  12/18/2017   Procedure: BIOPSY;  Surgeon: Doran Stabler, MD;  Location: [facility];  Service: Gastroenterology;;  . CARDIAC CATHETERIZATION  02/09/2013   small vessel dz of 2nd diag branch with 70-80% stenosis--preserved LV function. Medical therapy rec'd. Agustin Cree, PA)  . CARDIOVASCULAR STRESS TEST  01/26/2013   No ischemia.  Wall motion abnormalities at inferior base suggesting small area of infarction.  EF 64%.  . CAROTID ARTERY DOPPLERS  10/15/2017   1-39% stenosis R ICA, no stenosis L ICA.  [name] CATARACT EXTRACTION    . COLONOSCOPY N/A 12/19/2017   Clotted blood in cecum w/out any underlying  abnormality found -->entired colon and terminal ilium normal.  Procedure: COLONOSCOPY;  Surgeon: Doran Stabler, MD;  Location: [facility];  Service: Gastroenterology;  Laterality: N/A;  . cystoprostatectomy w/LN surgery  10/17/2016   Ileal conduit: this is an incontinent urine diversion that directs urine from the ureters through a segment of isolated bowel to the surface of the abdominal wall via a cutaneous stoma, and urine drains continuously into an external ostomy appliance.  . CYSTOSCOPY WITH INJECTION N/A 10/17/2016   Procedure: CYSTOSCOPY WITH INJECTION OF INDOCYANINE GREEN DYE;  Surgeon: Alexis Frock, MD;  Location: [facility];  Service: Urology;  Laterality: N/A;  . ESOPHAGOGASTRODUODENOSCOPY N/A 12/18/2017   Mild chronic gastritis, no metaplasia or dysplasia.  H pylori pending as of 12/21/17.  nonbleeding duodenal ulcers.  Procedure: ESOPHAGOGASTRODUODENOSCOPY (EGD);  Surgeon: Doran Stabler, MD;  Location: [facility];  Service: Gastroenterology;  Laterality: N/A;  . EYE SURGERY     cataract surgery bilat   . IR GENERIC HISTORICAL  05/12/2016   IR US GUIDE VASC ACCESS RIGHT 05/12/2016 Greggory Keen, MD [facility]  . IR GENERIC HISTORICAL  05/12/2016   IR FLUORO GUIDE PORT INSERTION RIGHT 05/12/2016 Greggory Keen, MD [facility]  . IR REMOVAL TUN ACCESS W/ PORT W/O FL MOD SED  07/03/2017  . OSTOMY     urostomy  . TONSILLECTOMY AND ADENOIDECTOMY  Remote  . TRANSTHORACIC ECHOCARDIOGRAM  10/16/2017   EF 65-70%, normal wall motion, grd I DD.  [name] TRANSURETHRAL RESECTION OF PROSTATE N/A 04/02/2016   Procedure: CYSTOSCOPY, TRANSURETHRAL RESECTION OF BLADDER TUMOR;  Surgeon: [name], MD;  Location: [facility];  Service: Urology;  Laterality: N/A;  . UMBILICAL HERNIA REPAIR  10/2016  . VASECTOMY      Outpatient Medications Prior to Visit  Medication Sig Dispense Refill  . Continuous Blood Gluc Sensor (FREESTYLE LIBRE 14 DAY SENSOR) MISC 1 each by Does not apply route daily.  2 each 12  . insulin aspart (NOVOLOG FLEXPEN) 100 UNIT/ML FlexPen CBG < 70: implement hypoglycemia protocol CBG 70 - 120: 3 units CBG 121 - 150: 5 units CBG 151 - 200: 6 units CBG 201 - 250: 8 units CBG 251 - 300: 11 units CBG 301 - 350: 14 units CBG 351 - 400: 18 units CBG > 400: call MD 15 mL 6  . insulin glargine (LANTUS) 100 unit/mL SOPN Inject 0.09 mLs (9 Units total) into the skin daily.    . Insulin Pen Needle (PEN NEEDLES) 32G X 4 MM MISC 1 each by Does not apply route daily. 100 each 11  . Insulin Pen Needle 31G X 5 MM MISC Inject insulin via insulin pen 3 x daily with meals 100 each 3  . Lancets 30G MISC Use to check blood sugar 4 times daily 100 each 0  . lisinopril (PRINIVIL,ZESTRIL) 20 MG tablet Take 1 tablet (  20 mg total) by mouth 2 (two) times daily. 60 tablet 5  . pravastatin (PRAVACHOL) 20 MG tablet TAKE 1 TABLET BY MOUTH EVERY DAY 90 tablet 1  . pantoprazole (PROTONIX) 40 MG tablet TAKE 1 TABLET BY MOUTH TWICE A DAY BEFORE A MEAL. (Patient not taking: Reported on 07/28/2018) 180 tablet 0  . terbinafine (LAMISIL) 250 MG tablet 1 tab po qd x 90 days (Patient not taking: Reported on 07/28/2018) 90 tablet 0   No facility-administered medications prior to visit.     Allergies  Allergen Reactions  . Other Swelling    Pt states had swelling of the eye when having eye surgery and the anesthetic placed in eye     ROS As per HPI  PE: Blood pressure 113/61, pulse (!) 54, temperature 97.8 F (36.6 C), temperature source Oral, resp. rate 16, height 5\' 7"  (1.702 m), weight 204 lb 8 oz (92.8 kg), SpO2 100 %. Gen: Alert, well appearing.  Patient is oriented to person, place, time, and situation. AFFECT: pleasant, lucid thought and speech. CV: RRR, no m/r/g.   LUNGS: CTA bilat, nonlabored resps, good aeration in all lung fields. ABD: soft, NT/ND EXT: no clubbing or cyanosis.  3+ pitting edema bilat.    LABS:  Lab Results  Component Value Date   TSH 1.084 10/14/2017   Lab  Results  Component Value Date   WBC 6.1 04/23/2018   HGB 12.5 (L) 04/23/2018   HCT 38.6 04/23/2018   MCV 79.8 (L) 04/23/2018   PLT 247 04/23/2018   Lab Results  Component Value Date   CREATININE 1.49 (H) 04/23/2018   BUN 28 (H) 04/23/2018   NA 140 04/23/2018   K 4.7 04/23/2018   CL 107 04/23/2018   CO2 24 04/23/2018   Lab Results  Component Value Date   ALT 12 (L) 12/17/2017   AST 11 (L) 12/17/2017   ALKPHOS 57 12/17/2017   BILITOT 0.2 (L) 12/17/2017   Lab Results  Component Value Date   CHOL 94 10/16/2017   Lab Results  Component Value Date   HDL 26 (L) 10/16/2017   Lab Results  Component Value Date   LDLCALC 50 10/16/2017   Lab Results  Component Value Date   TRIG 88 10/16/2017   Lab Results  Component Value Date   CHOLHDL 3.6 10/16/2017   Lab Results  Component Value Date   HGBA1C 6.1 (H) 04/23/2018    IMPRESSION AND PLAN:  1) DM 2, insulin dependent. Has had excellent control of late. hbA1c today. He declined pneumovax 23 today.  2) HTN: The current medical regimen is effective;  continue present plan and medications. Lytes/cr today.  3) Elevated sCr: 3 mo ago.  Pt states he hydrates well.  Chronic illnesses have been well controlled for about the last 10 months.  Baseline GFR about 60 ml/min. Repeat BMET today.  4) HLD: tolerating statin.  Last lipids excellent 10/2017.  Plan repeat FLP next f/u visit.  5) IDA, secondary to GI bleed 12/2017-->he has continued to take one ferrous sulfate daily. Check CBC today and if Hb 13 or more I'll give him the option of d/c'ing his iron tab. Sounds like he wants to go ahead and keep taking it, though.  He will be calling urology and oncology offices to set up routine follow up appts since his f/u's for the bladder cancer at each of these offices were postponed in 2019 due to his GI bleed and his DKA.  An After Visit Summary  was printed and given to the patient.  FOLLOW UP: Return in about 4 months (around  11/26/2018) for routine chronic illness f/u-->morning appt so patient can come in fasting.--get FLP.  Signed:  Crissie Sickles, MD           07/28/2018

## 2018-09-14 ENCOUNTER — Other Ambulatory Visit: Payer: Self-pay | Admitting: Family Medicine

## 2018-10-13 ENCOUNTER — Other Ambulatory Visit: Payer: Self-pay | Admitting: Family Medicine

## 2018-11-26 ENCOUNTER — Other Ambulatory Visit: Payer: Self-pay

## 2018-11-26 ENCOUNTER — Ambulatory Visit (INDEPENDENT_AMBULATORY_CARE_PROVIDER_SITE_OTHER): Payer: Medicare Other | Admitting: Family Medicine

## 2018-11-26 ENCOUNTER — Encounter: Payer: Self-pay | Admitting: Family Medicine

## 2018-11-26 VITALS — BP 111/64 | HR 68 | Ht 67.0 in | Wt 194.0 lb

## 2018-11-26 DIAGNOSIS — N183 Chronic kidney disease, stage 3 unspecified: Secondary | ICD-10-CM

## 2018-11-26 DIAGNOSIS — I10 Essential (primary) hypertension: Secondary | ICD-10-CM | POA: Diagnosis not present

## 2018-11-26 DIAGNOSIS — K862 Cyst of pancreas: Secondary | ICD-10-CM | POA: Diagnosis not present

## 2018-11-26 DIAGNOSIS — E7800 Pure hypercholesterolemia, unspecified: Secondary | ICD-10-CM

## 2018-11-26 DIAGNOSIS — E119 Type 2 diabetes mellitus without complications: Secondary | ICD-10-CM | POA: Diagnosis not present

## 2018-11-26 NOTE — Progress Notes (Signed)
Virtual Visit via Video Note  I connected with pt on 11/26/18 at  8:20 AM EDT by a video enabled telemedicine application and verified that I am speaking with the correct person using two identifiers.  Location patient: home Location provider:work or home office Persons participating in the virtual visit: patient, provider  I discussed the limitations of evaluation and management by telemedicine and the availability of in person appointments. The patient expressed understanding and agreed to proceed.   HPI: 83 y/o WM being seen today for 4 mo f/u DM, HTN, HLD, and CRI with GFR @ 60 ml/min. He does have hx of CVA without residual deficit. Feeling great, walking lots during the day.  DM: not taking lantus lately b/c says glucoses very good.   Fastings around 100-110.   Not checking glucoses later in the day most days now. Occ takes mealtime insulin (sometimes to "chase" an elevated glucose).  HTN: 115/60s avg.  Pulse avg 60.  HLD: tolerating statin, diet getting healthier, walking frequently for exercise.  CRI: avoids NSAIDs, hydrates well.   ROS: See pertinent positives and negatives per HPI.  Past Medical History:  Diagnosis Date  . Abnormal EKG 2014   This led to stress test which was abnl, which led to cardiology referral: cath showed small vessel dz of 2nd diag branch with 70-80% stenosis--preserved LV function  . Bilateral renal cysts    Non-complex (CT 03/2016): no enhancement, coarse calcifications, mass effect does give false appearance of hydro on non-contract series (followed by urol).  . Bladder cancer ([place]) 03/2016   Invasive high grade urothelial carcinoma.  No mets at dx.  Cystoscopy + bx and partial resection of tumor in hosp when admitted for gross hematuria and AUR.  Neoadj chemo 06/2016, then got cystoprostatectomy. (Urostomy--ileal conduit urinary diversion with refluxing anastamoses--has RLQ urostomy)-gets routine surveillance by urol (Dr. Tresa Moore), most recent  04/2017--no sign of recurrence--obs status  . CAD (coronary artery disease)    small vessel dz x 1 vessel on cath 2014: med mgmt  . Cerebellar cerebrovascular accident (CVA) without late effect 10/2017   Left, punctate: DAPT therapy x 3 wks per Dr. Leonie Man, neuro, then ASA 81mg  alone.  Stable as of 11/2017 f/u with neuro, Dr. Erlinda Hong.  . Chronic calcific pancreatitis ([place]) 03/2016   CT: also small cyst in head of pancreas that needs 6 mo f/u imaging (09/2016)  . Chronic renal insufficiency, stage 2 (mild) 2019   Borderline II/III  . Diabetes mellitus without complication ([place])    Insulin dependent; DKA admission 10/2017.  . GI bleed 12/2017   Presented with melena and Hb around 5.  Transfused 5 U pRBCs, started to get hematochezia in hosp: no clear explanation for GI bleeding was found on EGD or colonoscopy.  CT abd w/out acute abnormality.  Upper GI w/SBFT showed subtle duodenal erosion.  [name] Heart murmur, systolic    Noted in prior PCP's records.  I recommended echocardiogram 07/2016 but pt declined.  [name] History of Bell's palsy   . History of chemotherapy   . Hyperlipidemia   . Hypertension   . IDA (iron deficiency anemia) 12/2017   GI bleed  . Pancreas cyst 03/2016   CT abd/pelv 12/2017 for GI bleed w/u showed 1.6 cm pancreatic head cyst-->stable and likely benign.  Abd MRI f/u 6-12 mo recommended.  [name] Umbilical hernia    Repaired when he got his bladder surgery.    Past Surgical History:  Procedure Laterality Date  . BIOPSY  12/18/2017  Procedure: BIOPSY;  Surgeon: Doran Stabler, MD;  Location: [facility];  Service: Gastroenterology;;  . CARDIAC CATHETERIZATION  02/09/2013   small vessel dz of 2nd diag branch with 70-80% stenosis--preserved LV function. Medical therapy rec'd. Agustin Cree, PA)  . CARDIOVASCULAR STRESS TEST  01/26/2013   No ischemia.  Wall motion abnormalities at inferior base suggesting small area of infarction.  EF 64%.  . CAROTID ARTERY DOPPLERS  10/15/2017   1-39%  stenosis R ICA, no stenosis L ICA.  [name] CATARACT EXTRACTION    . COLONOSCOPY N/A 12/19/2017   Clotted blood in cecum w/out any underlying abnormality found -->entired colon and terminal ilium normal.  Procedure: COLONOSCOPY;  Surgeon: Doran Stabler, MD;  Location: [facility];  Service: Gastroenterology;  Laterality: N/A;  . cystoprostatectomy w/LN surgery  10/17/2016   Ileal conduit: this is an incontinent urine diversion that directs urine from the ureters through a segment of isolated bowel to the surface of the abdominal wall via a cutaneous stoma, and urine drains continuously into an external ostomy appliance.  . CYSTOSCOPY WITH INJECTION N/A 10/17/2016   Procedure: CYSTOSCOPY WITH INJECTION OF INDOCYANINE GREEN DYE;  Surgeon: Alexis Frock, MD;  Location: [facility];  Service: Urology;  Laterality: N/A;  . ESOPHAGOGASTRODUODENOSCOPY N/A 12/18/2017   Mild chronic gastritis, no metaplasia or dysplasia.  H pylori pending as of 12/21/17.  nonbleeding duodenal ulcers.  Procedure: ESOPHAGOGASTRODUODENOSCOPY (EGD);  Surgeon: Doran Stabler, MD;  Location: [facility];  Service: Gastroenterology;  Laterality: N/A;  . EYE SURGERY     cataract surgery bilat   . IR GENERIC HISTORICAL  05/12/2016   IR US GUIDE VASC ACCESS RIGHT 05/12/2016 Greggory Keen, MD [facility]  . IR GENERIC HISTORICAL  05/12/2016   IR FLUORO GUIDE PORT INSERTION RIGHT 05/12/2016 Greggory Keen, MD [facility]  . IR REMOVAL TUN ACCESS W/ PORT W/O FL MOD SED  07/03/2017  . OSTOMY     urostomy  . TONSILLECTOMY AND ADENOIDECTOMY  Remote  . TRANSTHORACIC ECHOCARDIOGRAM  10/16/2017   EF 65-70%, normal wall motion, grd I DD.  [name] TRANSURETHRAL RESECTION OF PROSTATE N/A 04/02/2016   Procedure: CYSTOSCOPY, TRANSURETHRAL RESECTION OF BLADDER TUMOR;  Surgeon: [name], MD;  Location: [facility];  Service: Urology;  Laterality: N/A;  . UMBILICAL HERNIA REPAIR  10/2016  . VASECTOMY      Family History  Problem Relation Age of  Onset  . Diabetes Mellitus I Mother   . Diabetes Mellitus I Sister   . Stroke Paternal Grandfather      Current Outpatient Medications:  .  Continuous Blood Gluc Sensor (FREESTYLE LIBRE 14 DAY SENSOR) MISC, 1 each by Does not apply route daily., Disp: 2 each, Rfl: 12 .  FERROUS SULFATE PO, Take 27 mg by mouth. Takes 1 daily, Disp: , Rfl:  .  insulin aspart (NOVOLOG FLEXPEN) 100 UNIT/ML FlexPen, CBG < 70: implement hypoglycemia protocol CBG 70 - 120: 3 units CBG 121 - 150: 5 units CBG 151 - 200: 6 units CBG 201 - 250: 8 units CBG 251 - 300: 11 units CBG 301 - 350: 14 units CBG 351 - 400: 18 units CBG > 400: call MD, Disp: 15 mL, Rfl: 6 .  Insulin Pen Needle (PEN NEEDLES) 32G X 4 MM MISC, 1 each by Does not apply route daily., Disp: 100 each, Rfl: 11 .  Insulin Pen Needle 31G X 5 MM MISC, Inject insulin via insulin pen 3 x daily with meals, Disp: 100 each,  Rfl: 3 .  Lancets 30G MISC, Use to check blood sugar 4 times daily, Disp: 100 each, Rfl: 0 .  LANTUS SOLOSTAR 100 UNIT/ML Solostar Pen, INJECT 15 UNITS SUBQ AT BEDTIME, WILL BE TITRATING, Disp: 15 mL, Rfl: 3 .  lisinopril (PRINIVIL,ZESTRIL) 20 MG tablet, TAKE 1 TABLET BY MOUTH TWICE A DAY, Disp: 180 tablet, Rfl: 1 .  Omega-3 Fatty Acids (FISH OIL PO), Take by mouth. Takes 1 daily, Disp: , Rfl:  .  pravastatin (PRAVACHOL) 20 MG tablet, TAKE 1 TABLET BY MOUTH EVERY DAY, Disp: 90 tablet, Rfl: 1  EXAM:  VITALS per patient if applicable: BP 174/08 (BP Location: Left Arm, Patient Position: Sitting, Cuff Size: Large)   Pulse 68   Ht 5\' 7"  (1.702 m)   Wt 194 lb (88 kg)   BMI 30.38 kg/m    GENERAL: alert, oriented, appears well and in no acute distress  HEENT: atraumatic, conjunttiva clear, no obvious abnormalities on inspection of external nose and ears  NECK: normal movements of the head and neck  LUNGS: on inspection no signs of respiratory distress, breathing rate appears normal, no obvious gross SOB, gasping or wheezing  CV: no  obvious cyanosis  MS: moves all visible extremities without noticeable abnormality  PSYCH/NEURO: pleasant and cooperative, no obvious depression or anxiety, speech and thought processing grossly intact  LABS: none today    Chemistry      Component Value Date/Time   NA 138 07/28/2018 1324   NA 133 (A) 10/03/2017   NA 139 06/18/2017 1125   K 4.4 07/28/2018 1324   K 4.5 06/18/2017 1125   CL 109 07/28/2018 1324   CO2 22 07/28/2018 1324   CO2 27 10/02/2017   CO2 21 (L) 06/18/2017 1125   BUN 27 (H) 07/28/2018 1324   BUN 46 (A) 10/03/2017   BUN 26.5 (H) 06/18/2017 1125   CREATININE 1.23 07/28/2018 1324   CREATININE 1.49 (H) 04/23/2018 1535   CREATININE 1.2 06/18/2017 1125   GLU 525 10/03/2017      Component Value Date/Time   CALCIUM 9.0 07/28/2018 1324   CALCIUM 9.3 06/18/2017 1125   ALKPHOS 57 12/17/2017 1127   ALKPHOS 67 06/18/2017 1125   AST 11 (L) 12/17/2017 1127   AST 12 06/18/2017 1125   ALT 12 (L) 12/17/2017 1127   ALT 13 06/18/2017 1125   BILITOT 0.2 (L) 12/17/2017 1127   BILITOT 0.40 06/18/2017 1125     Lab Results  Component Value Date   WBC 5.9 07/28/2018   HGB 12.8 (L) 07/28/2018   HCT 38.6 (L) 07/28/2018   MCV 89.7 07/28/2018   PLT 172.0 07/28/2018   Lab Results  Component Value Date   HGBA1C 7.0 (H) 07/28/2018   Lab Results  Component Value Date   CHOL 94 10/16/2017   HDL 26 (L) 10/16/2017   LDLCALC 50 10/16/2017   TRIG 88 10/16/2017   CHOLHDL 3.6 10/16/2017    ASSESSMENT AND PLAN:  Discussed the following assessment and plan:  1) DM 2:  Great control as per home gluc's. Not even taking much insulin AT ALL. HbA1c future, hopefully it will correlate. Lytes/cr--future.  2) HTN: The current medical regimen is effective;  continue present plan and medications. Lytes/cr future.  3) HLD: tolerating statin.  FLP and hepatic panel --future.  4) CRI: avoiding NSAIDs and hydrating well. Lytes/cr future.  5) Pancreatic cyst: 2017->f/u  imaging 2018 showed stability.  Radiol recommended MR abd w and w/out contrast 6-12 mo so this needs  to be done sometime in the next couple of months. Ordered today.  I discussed the assessment and treatment plan with the patient. The patient was provided an opportunity to ask questions and all were answered. The patient agreed with the plan and demonstrated an understanding of the instructions.   The patient was advised to call back or seek an in-person evaluation if the symptoms worsen or if the condition fails to improve as anticipated.  F/u: 4 mo RCI  Signed:  Crissie Sickles, MD           11/26/2018

## 2018-12-04 ENCOUNTER — Other Ambulatory Visit: Payer: Self-pay

## 2018-12-04 ENCOUNTER — Ambulatory Visit
Admission: RE | Admit: 2018-12-04 | Discharge: 2018-12-04 | Disposition: A | Discharge status: Home / Self Care | Payer: Medicare Other | Source: Ambulatory Visit | Attending: Family Medicine | Admitting: Family Medicine

## 2018-12-04 DIAGNOSIS — K862 Cyst of pancreas: Secondary | ICD-10-CM | POA: Diagnosis not present

## 2018-12-04 MED ORDER — GADOBENATE DIMEGLUMINE 529 MG/ML IV SOLN
18.0000 mL | Freq: Once | INTRAVENOUS | Status: AC | PRN
  Administered 2018-12-04: 18 mL via INTRAVENOUS

## 2018-12-07 ENCOUNTER — Ambulatory Visit (INDEPENDENT_AMBULATORY_CARE_PROVIDER_SITE_OTHER): Payer: Medicare Other | Admitting: Family Medicine

## 2018-12-07 ENCOUNTER — Encounter: Payer: Self-pay | Admitting: Family Medicine

## 2018-12-07 ENCOUNTER — Other Ambulatory Visit: Payer: Self-pay

## 2018-12-07 DIAGNOSIS — E119 Type 2 diabetes mellitus without complications: Secondary | ICD-10-CM | POA: Diagnosis not present

## 2018-12-07 DIAGNOSIS — E7800 Pure hypercholesterolemia, unspecified: Secondary | ICD-10-CM | POA: Diagnosis not present

## 2018-12-07 DIAGNOSIS — N183 Chronic kidney disease, stage 3 unspecified: Secondary | ICD-10-CM

## 2018-12-07 DIAGNOSIS — R748 Abnormal levels of other serum enzymes: Secondary | ICD-10-CM

## 2018-12-07 DIAGNOSIS — N2889 Other specified disorders of kidney and ureter: Secondary | ICD-10-CM

## 2018-12-07 LAB — COMPREHENSIVE METABOLIC PANEL
ALT: 16 U/L (ref 0–53)
AST: 13 U/L (ref 0–37)
Albumin: 4 g/dL (ref 3.5–5.2)
Alkaline Phosphatase: 88 U/L (ref 39–117)
BUN: 30 mg/dL — ABNORMAL HIGH (ref 6–23)
CO2: 22 mEq/L (ref 19–32)
Calcium: 9.1 mg/dL (ref 8.4–10.5)
Chloride: 108 mEq/L (ref 96–112)
Creatinine, Ser: 1.35 mg/dL (ref 0.40–1.50)
GFR: 50.7 mL/min — ABNORMAL LOW (ref >60.00)
Glucose, Bld: 118 mg/dL — ABNORMAL HIGH (ref 70–99)
Potassium: 5 mEq/L (ref 3.5–5.1)
Sodium: 140 mEq/L (ref 135–145)
Total Bilirubin: 0.3 mg/dL (ref 0.2–1.2)
Total Protein: 7.2 g/dL (ref 6.0–8.3)

## 2018-12-07 LAB — BASIC METABOLIC PANEL
BUN: 30 mg/dL — ABNORMAL HIGH (ref 6–23)
CO2: 22 mEq/L (ref 19–32)
Calcium: 9.1 mg/dL (ref 8.4–10.5)
Chloride: 108 mEq/L (ref 96–112)
Creatinine, Ser: 1.35 mg/dL (ref 0.40–1.50)
GFR: 50.7 mL/min — ABNORMAL LOW (ref >60.00)
Glucose, Bld: 118 mg/dL — ABNORMAL HIGH (ref 70–99)
Potassium: 5 mEq/L (ref 3.5–5.1)
Sodium: 140 mEq/L (ref 135–145)

## 2018-12-07 LAB — LIPID PANEL
Cholesterol: 119 mg/dL (ref 0–200)
HDL: 34.9 mg/dL — ABNORMAL LOW (ref >39.00)
LDL Cholesterol: 60 mg/dL (ref 0–99)
NonHDL: 83.6
Total CHOL/HDL Ratio: 3
Triglycerides: 116 mg/dL (ref 0.0–149.0)
VLDL: 23.2 mg/dL (ref 0.0–40.0)

## 2018-12-07 LAB — HEMOGLOBIN A1C: Hgb A1c MFr Bld: 7.4 % — ABNORMAL HIGH (ref 4.6–6.5)

## 2018-12-08 ENCOUNTER — Encounter: Payer: Self-pay | Admitting: Family Medicine

## 2018-12-13 ENCOUNTER — Other Ambulatory Visit: Payer: Self-pay

## 2018-12-13 MED ORDER — PRAVASTATIN SODIUM 20 MG PO TABS: 20.0000 mg | ORAL_TABLET | Freq: Every day | ORAL | 1 refills | Status: DC

## 2019-02-08 ENCOUNTER — Other Ambulatory Visit: Payer: Self-pay | Admitting: Family Medicine

## 2019-02-18 ENCOUNTER — Encounter: Payer: Self-pay | Admitting: Family Medicine

## 2019-02-18 ENCOUNTER — Other Ambulatory Visit: Payer: Self-pay

## 2019-02-18 ENCOUNTER — Ambulatory Visit (INDEPENDENT_AMBULATORY_CARE_PROVIDER_SITE_OTHER): Payer: Medicare Other | Admitting: Family Medicine

## 2019-02-18 ENCOUNTER — Ambulatory Visit: Payer: Medicare Other | Admitting: Family Medicine

## 2019-02-18 VITALS — BP 121/66 | HR 53 | Temp 98.6°F | Resp 16 | Ht 67.0 in | Wt 181.6 lb

## 2019-02-18 DIAGNOSIS — L282 Other prurigo: Secondary | ICD-10-CM | POA: Diagnosis not present

## 2019-02-18 DIAGNOSIS — W57XXXA Bitten or stung by nonvenomous insect and other nonvenomous arthropods, initial encounter: Secondary | ICD-10-CM

## 2019-02-18 MED ORDER — CETIRIZINE HCL 10 MG PO TABS: 10.0000 mg | ORAL_TABLET | Freq: Every day | ORAL | 11 refills | Status: DC

## 2019-02-18 MED ORDER — PREDNISONE 10 MG PO TABS: ORAL_TABLET | ORAL | 0 refills | Status: DC

## 2019-02-18 NOTE — Progress Notes (Signed)
OFFICE VISIT  02/28/2019   CC:  Chief Complaint  Patient presents with  . Tick bite    x 8 weeks ago, itching all over body with red spots   HPI:    Patient is a 83 y.o. Caucasian male who presents for approx 6 wk hx of rash consisting of small red bumps that itch a lot.  He is a tough historian, but it sounds like the skin started itching first and then after he scratched at various areas, the little red bumps started to come up.  Have gradually spread over abd/arms/legs/back. Scalp, face, hands, feet are spared.  No known insect bites or plant resin exposure.  No potential irritants or allergens identified by pt.  He found a tick on his R upper thigh a couple weeks before the onset of the rash, says it was a deer tick, unknown how long it was on.  He describes having a small spot of erythema about the size of a dime for a while and then it faded away.  No target lesion or erythema migrans-type rash. No joint or muscle aches, no fevers, no fatigue, no dizziness/palpitations, no n/v, no HAs. OTC hydrocortisone cream has been temporarily helpful. No exposure to scabies.  No exposure to anyone with similar rash. He lives with his wife but they sleep in separate beds.  Past Medical History:  Diagnosis Date  . Abnormal EKG 2014   This led to stress test which was abnl, which led to cardiology referral: cath showed small vessel dz of 2nd diag branch with 70-80% stenosis--preserved LV function  . Aortic atherosclerosis ([place])    w/out aneurism  . Bilateral renal cysts    Non-complex (CT 03/2016): no enhancement, coarse calcifications, mass effect does give false appearance of hydro on non-contract series (followed by urol).  . Bladder cancer ([place]) 03/2016   Invasive high grade urothelial carcinoma.  No mets at dx.  Cystoscopy + bx and partial resection of tumor in hosp when admitted for gross hematuria and AUR.  Neoadj chemo 06/2016, then got cystoprostatectomy. (Urostomy--ileal conduit urinary  diversion with refluxing anastamoses--has RLQ urostomy)-gets routine surveillance by urol (Dr. Tresa Moore), most recent 04/2017--no sign of recurrence--obs status  . CAD (coronary artery disease)    small vessel dz x 1 vessel on cath 2014: med mgmt  . Cerebellar cerebrovascular accident (CVA) without late effect 10/2017   Left, punctate: DAPT therapy x 3 wks per Dr. Leonie Man, neuro, then ASA 81mg  alone.  Stable as of 11/2017 f/u with neuro, Dr. Erlinda Hong.  . Chronic calcific pancreatitis ([place]) 03/2016   +stable on f/u imaging 11/2018, + 2 small pseudocysts  . Chronic renal insufficiency, stage 2 (mild) 2019   Borderline II/III  . Diabetes mellitus without complication ([place])    Insulin dependent; DKA admission 10/2017.  . GI bleed 12/2017   Presented with melena and Hb around 5.  Transfused 5 U pRBCs, started to get hematochezia in hosp: no clear explanation for GI bleeding was found on EGD or colonoscopy.  CT abd w/out acute abnormality.  Upper GI w/SBFT showed subtle duodenal erosion.  [name] Heart murmur, systolic    Noted in prior PCP's records.  I recommended echocardiogram 07/2016 but pt declined.  [name] History of Bell's palsy   . History of chemotherapy   . Hyperlipidemia   . Hypertension   . IDA (iron deficiency anemia) 12/2017   GI bleed  . Pancreas cyst 03/2016   CT abd/pelv 12/2017 for GI bleed w/u showed 1.6  cm pancreatic head cyst-->stable and likely benign.  Abd MRI 11/2018->stable pancreatic pseudocysts + chronic pancreatitis changes. Rpt MRI abd w &w/out contrast 2 yrs.  [name] Umbilical hernia    Repaired when he got his bladder surgery.    Past Surgical History:  Procedure Laterality Date  . BIOPSY  12/18/2017   Procedure: BIOPSY;  Surgeon: Doran Stabler, MD;  [facility]: [facility];  Service: Gastroenterology;;  . CARDIAC CATHETERIZATION  02/09/2013   small vessel dz of 2nd diag branch with 70-80% stenosis--preserved LV function. Medical therapy rec'd. Agustin Cree, PA)  . CARDIOVASCULAR STRESS  TEST  01/26/2013   No ischemia.  Wall motion abnormalities at inferior base suggesting small area of infarction.  EF 64%.  . CAROTID ARTERY DOPPLERS  10/15/2017   1-39% stenosis R ICA, no stenosis L ICA.  [name] CATARACT EXTRACTION    . COLONOSCOPY N/A 12/19/2017   Clotted blood in cecum w/out any underlying abnormality found -->entired colon and terminal ilium normal.  Procedure: COLONOSCOPY;  Surgeon: Doran Stabler, MD;  Location: [facility];  Service: Gastroenterology;  Laterality: N/A;  . cystoprostatectomy w/LN surgery  10/17/2016   Ileal conduit: this is an incontinent urine diversion that directs urine from the ureters through a segment of isolated bowel to the surface of the abdominal wall via a cutaneous stoma, and urine drains continuously into an external ostomy appliance.  . CYSTOSCOPY WITH INJECTION N/A 10/17/2016   Procedure: CYSTOSCOPY WITH INJECTION OF INDOCYANINE GREEN DYE;  Surgeon: Alexis Frock, MD;  Location: [facility];  Service: Urology;  Laterality: N/A;  . ESOPHAGOGASTRODUODENOSCOPY N/A 12/18/2017   Mild chronic gastritis, no metaplasia or dysplasia.  H pylori pending as of 12/21/17.  nonbleeding duodenal ulcers.  Procedure: ESOPHAGOGASTRODUODENOSCOPY (EGD);  Surgeon: Doran Stabler, MD;  Location: [facility];  Service: Gastroenterology;  Laterality: N/A;  . EYE SURGERY     cataract surgery bilat   . IR GENERIC HISTORICAL  05/12/2016   IR US GUIDE VASC ACCESS RIGHT 05/12/2016 Greggory Keen, MD [facility]  . IR GENERIC HISTORICAL  05/12/2016   IR FLUORO GUIDE PORT INSERTION RIGHT 05/12/2016 Greggory Keen, MD [facility]  . IR REMOVAL TUN ACCESS W/ PORT W/O FL MOD SED  07/03/2017  . OSTOMY     urostomy  . TONSILLECTOMY AND ADENOIDECTOMY  Remote  . TRANSTHORACIC ECHOCARDIOGRAM  10/16/2017   EF 65-70%, normal wall motion, grd I DD.  [name] TRANSURETHRAL RESECTION OF PROSTATE N/A 04/02/2016   Procedure: CYSTOSCOPY, TRANSURETHRAL RESECTION OF BLADDER TUMOR;  Surgeon: [name], MD;  Location: [facility];  Service: Urology;  Laterality: N/A;  . UMBILICAL HERNIA REPAIR  10/2016  . VASECTOMY      Outpatient Medications Prior to Visit  Medication Sig Dispense Refill  . Continuous Blood Gluc Sensor (FREESTYLE LIBRE 14 DAY SENSOR) MISC 1 each by Does not apply route daily. 2 each 12  . FERROUS SULFATE PO Take 27 mg by mouth. Takes 1 daily    . insulin aspart (NOVOLOG FLEXPEN) 100 UNIT/ML FlexPen CBG < 70: implement hypoglycemia protocol CBG 70 - 120: 3 units CBG 121 - 150: 5 units CBG 151 - 200: 6 units CBG 201 - 250: 8 units CBG 251 - 300: 11 units CBG 301 - 350: 14 units CBG 351 - 400: 18 units CBG > 400: call MD 15 mL 6  . Insulin Pen Needle (PEN NEEDLES) 32G X 4 MM MISC 1 each by Does not apply route daily. 100 each 11  .  Insulin Pen Needle 31G X 5 MM MISC Inject insulin via insulin pen 3 x daily with meals 100 each 3  . Lancets 30G MISC Use to check blood sugar 4 times daily 100 each 0  . LANTUS SOLOSTAR 100 UNIT/ML Solostar Pen INJECT 15 UNITS SUBQ AT BEDTIME, WILL BE TITRATING 15 mL 3  . lisinopril (PRINIVIL,ZESTRIL) 20 MG tablet TAKE 1 TABLET BY MOUTH TWICE A DAY 180 tablet 1  . Omega-3 Fatty Acids (FISH OIL PO) Take by mouth. Takes 1 daily    . pravastatin (PRAVACHOL) 20 MG tablet Take 1 tablet (20 mg total) by mouth daily. 90 tablet 1   No facility-administered medications prior to visit.     Allergies  Allergen Reactions  . Other Swelling    Pt states had swelling of the eye when having eye surgery and the anesthetic placed in eye     ROS As per HPI  PE: Blood pressure 121/66, pulse (!) 53, temperature 98.6 F (37 C), temperature source Temporal, resp. rate 16, height 5\' 7"  (1.702 m), weight 181 lb 9.6 oz (82.4 kg), SpO2 100 %. Gen: Alert, well appearing.  Patient is oriented to person, place, time, and situation. AFFECT: pleasant, lucid thought and speech. CV: RRR, no m/r/g.   LUNGS: CTA bilat, nonlabored resps, good aeration in all  lung fields. ABD: soft, NT, ND EXT: no clubbing or cyanosis.  No edema.  Skin: scattered 1-2 mm pinkish papules, some are excoriated, some with tiny vesicle.  Areas involved are arms, legs, back, abd, legs.   LABS:  None today    Chemistry      Component Value Date/Time   NA 140 12/07/2018 1026   NA 140 12/07/2018 1026   NA 133 (A) 10/03/2017   NA 139 06/18/2017 1125   K 5.0 12/07/2018 1026   K 5.0 12/07/2018 1026   K 4.5 06/18/2017 1125   CL 108 12/07/2018 1026   CL 108 12/07/2018 1026   CO2 22 12/07/2018 1026   CO2 22 12/07/2018 1026   CO2 27 10/02/2017   CO2 21 (L) 06/18/2017 1125   BUN 30 (H) 12/07/2018 1026   BUN 30 (H) 12/07/2018 1026   BUN 46 (A) 10/03/2017   BUN 26.5 (H) 06/18/2017 1125   CREATININE 1.35 12/07/2018 1026   CREATININE 1.35 12/07/2018 1026   CREATININE 1.49 (H) 04/23/2018 1535   CREATININE 1.2 06/18/2017 1125   GLU 525 10/03/2017      Component Value Date/Time   CALCIUM 9.1 12/07/2018 1026   CALCIUM 9.1 12/07/2018 1026   CALCIUM 9.3 06/18/2017 1125   ALKPHOS 88 12/07/2018 1026   ALKPHOS 67 06/18/2017 1125   AST 13 12/07/2018 1026   AST 12 06/18/2017 1125   ALT 16 12/07/2018 1026   ALT 13 06/18/2017 1125   BILITOT 0.3 12/07/2018 1026   BILITOT 0.40 06/18/2017 1125       IMPRESSION AND PLAN:  1) Itchy rash, unknown etiology. I don't think this has anything to do with his tick bite. REassured pt. Prednisone taper x 21d: 40 x 7d, 20 x 7d, 10 x 7d. Zyrtec 10mg  qd.  An After Visit Summary was printed and given to the patient.  FOLLOW UP: Return in about 2 weeks (around 03/04/2019) for f/u rash.  Signed:  Crissie Sickles, MD           02/28/2019

## 2019-03-29 ENCOUNTER — Ambulatory Visit: Payer: Medicare Other | Admitting: Family Medicine

## 2019-03-31 ENCOUNTER — Ambulatory Visit (INDEPENDENT_AMBULATORY_CARE_PROVIDER_SITE_OTHER): Payer: Medicare Other | Admitting: Family Medicine

## 2019-03-31 ENCOUNTER — Other Ambulatory Visit: Payer: Self-pay

## 2019-03-31 ENCOUNTER — Encounter: Payer: Self-pay | Admitting: Family Medicine

## 2019-03-31 VITALS — BP 88/64 | HR 74 | Temp 98.8°F | Resp 16 | Ht 67.0 in | Wt 187.4 lb

## 2019-03-31 DIAGNOSIS — R011 Cardiac murmur, unspecified: Secondary | ICD-10-CM

## 2019-03-31 DIAGNOSIS — N183 Chronic kidney disease, stage 3 unspecified: Secondary | ICD-10-CM

## 2019-03-31 DIAGNOSIS — E7800 Pure hypercholesterolemia, unspecified: Secondary | ICD-10-CM

## 2019-03-31 DIAGNOSIS — I10 Essential (primary) hypertension: Secondary | ICD-10-CM | POA: Diagnosis not present

## 2019-03-31 DIAGNOSIS — I959 Hypotension, unspecified: Secondary | ICD-10-CM | POA: Diagnosis not present

## 2019-03-31 DIAGNOSIS — E118 Type 2 diabetes mellitus with unspecified complications: Secondary | ICD-10-CM | POA: Diagnosis not present

## 2019-03-31 LAB — BASIC METABOLIC PANEL
BUN: 25 mg/dL — ABNORMAL HIGH (ref 6–23)
CO2: 20 mEq/L (ref 19–32)
Calcium: 8.4 mg/dL (ref 8.4–10.5)
Chloride: 110 mEq/L (ref 96–112)
Creatinine, Ser: 1.38 mg/dL (ref 0.40–1.50)
GFR: 49.39 mL/min — ABNORMAL LOW (ref >60.00)
Glucose, Bld: 171 mg/dL — ABNORMAL HIGH (ref 70–99)
Potassium: 4.4 mEq/L (ref 3.5–5.1)
Sodium: 139 mEq/L (ref 135–145)

## 2019-03-31 LAB — HEMOGLOBIN A1C: Hgb A1c MFr Bld: 7.6 % — ABNORMAL HIGH (ref 4.6–6.5)

## 2019-03-31 NOTE — Patient Instructions (Signed)
Take lisinopril 20mg  tab only ONCE per day.

## 2019-03-31 NOTE — Progress Notes (Signed)
OFFICE VISIT  03/31/2019   CC:  Chief Complaint  Patient presents with  . Follow-up    RCI, pt is not fasting   HPI:    Patient is a 83 y.o. Caucasian male who presents for 4 mo f/u DM, HTN, HLD, and CRI with GFR @ 60 ml/min. He does have hx of CVA without residual deficit.  DM: fastings typically 120, occ up to 150s.  Postprandially it goes up to 140-150. Currently taking zero to 6 units lantus qhs.  He can't recall the cutoff bedtime glucose he uses to determine whether or not to give lantus. He has not needed any mealtime insulin.  HTN: home bp's low lately (70s-80s syst and 50-60s on bottom)->lasts 1-2 mo or so?[name]  He attributes this to much improved diet.   No dizziness, weakness, or fatigue with these bp's.  He walks up/[place] hills around [place] w/out problem.  HLD: tolerating statin.  CRI: avoids NSAIDs, tries to stay well hydrated.  ROS: no CP, no SOB, no wheezing, no cough, no dizziness, no HAs, no rashes, no melena/hematochezia.  No polyuria or polydipsia.  No myalgias or arthralgias.   Past Medical History:  Diagnosis Date  . Abnormal EKG 2014   This led to stress test which was abnl, which led to cardiology referral: cath showed small vessel dz of 2nd diag branch with 70-80% stenosis--preserved LV function  . Aortic atherosclerosis ([place])    w/out aneurism  . Bilateral renal cysts    Non-complex (CT 03/2016): no enhancement, coarse calcifications, mass effect does give false appearance of hydro on non-contract series (followed by urol).  . Bladder cancer ([place]) 03/2016   Invasive high grade urothelial carcinoma.  No mets at dx.  Cystoscopy + bx and partial resection of tumor in hosp when admitted for gross hematuria and AUR.  Neoadj chemo 06/2016, then got cystoprostatectomy. (Urostomy--ileal conduit urinary diversion with refluxing anastamoses--has RLQ urostomy)-gets routine surveillance by urol (Dr. Tresa Moore), most recent 04/2017--no sign of recurrence--obs status  .  CAD (coronary artery disease)    small vessel dz x 1 vessel on cath 2014: med mgmt  . Cerebellar cerebrovascular accident (CVA) without late effect 10/2017   Left, punctate: DAPT therapy x 3 wks per Dr. Leonie Man, neuro, then ASA 81mg  alone.  Stable as of 11/2017 f/u with neuro, Dr. Erlinda Hong.  . Chronic calcific pancreatitis ([place]) 03/2016   +stable on f/u imaging 11/2018, + 2 small pseudocysts  . Chronic renal insufficiency, stage 2 (mild) 2019   Borderline II/III  . Diabetes mellitus without complication ([place])    Insulin dependent; DKA admission 10/2017.  . GI bleed 12/2017   Presented with melena and Hb around 5.  Transfused 5 U pRBCs, started to get hematochezia in hosp: no clear explanation for GI bleeding was found on EGD or colonoscopy.  CT abd w/out acute abnormality.  Upper GI w/SBFT showed subtle duodenal erosion.  [name] Heart murmur, systolic    Noted in prior PCP's records.  I recommended echocardiogram 07/2016 but pt declined.  [name] History of Bell's palsy   . History of chemotherapy   . Hyperlipidemia   . Hypertension   . IDA (iron deficiency anemia) 12/2017   GI bleed  . Pancreas cyst 03/2016   CT abd/pelv 12/2017 for GI bleed w/u showed 1.6 cm pancreatic head cyst-->stable and likely benign.  Abd MRI 11/2018->stable pancreatic pseudocysts + chronic pancreatitis changes. Rpt MRI abd w &w/out contrast 2 yrs (approx 11/2020).  [name] Umbilical hernia  Repaired when he got his bladder surgery.    Past Surgical History:  Procedure Laterality Date  . BIOPSY  12/18/2017   Procedure: BIOPSY;  Surgeon: Doran Stabler, MD;  Location: [facility];  Service: Gastroenterology;;  . CARDIAC CATHETERIZATION  02/09/2013   small vessel dz of 2nd diag branch with 70-80% stenosis--preserved LV function. Medical therapy rec'd. Agustin Cree, PA)  . CARDIOVASCULAR STRESS TEST  01/26/2013   No ischemia.  Wall motion abnormalities at inferior base suggesting small area of infarction.  EF 64%.  . CAROTID ARTERY  DOPPLERS  10/15/2017   1-39% stenosis R ICA, no stenosis L ICA.  [name] CATARACT EXTRACTION    . COLONOSCOPY N/A 12/19/2017   Clotted blood in cecum w/out any underlying abnormality found -->entired colon and terminal ilium normal.  Procedure: COLONOSCOPY;  Surgeon: Doran Stabler, MD;  Location: [facility];  Service: Gastroenterology;  Laterality: N/A;  . cystoprostatectomy w/LN surgery  10/17/2016   Ileal conduit: this is an incontinent urine diversion that directs urine from the ureters through a segment of isolated bowel to the surface of the abdominal wall via a cutaneous stoma, and urine drains continuously into an external ostomy appliance.  . CYSTOSCOPY WITH INJECTION N/A 10/17/2016   Procedure: CYSTOSCOPY WITH INJECTION OF INDOCYANINE GREEN DYE;  Surgeon: Alexis Frock, MD;  Location: [facility];  Service: Urology;  Laterality: N/A;  . ESOPHAGOGASTRODUODENOSCOPY N/A 12/18/2017   Mild chronic gastritis, no metaplasia or dysplasia.  H pylori pending as of 12/21/17.  nonbleeding duodenal ulcers.  Procedure: ESOPHAGOGASTRODUODENOSCOPY (EGD);  Surgeon: Doran Stabler, MD;  Location: [facility];  Service: Gastroenterology;  Laterality: N/A;  . EYE SURGERY     cataract surgery bilat   . IR GENERIC HISTORICAL  05/12/2016   IR US GUIDE VASC ACCESS RIGHT 05/12/2016 Greggory Keen, MD [facility]  . IR GENERIC HISTORICAL  05/12/2016   IR FLUORO GUIDE PORT INSERTION RIGHT 05/12/2016 Greggory Keen, MD [facility]  . IR REMOVAL TUN ACCESS W/ PORT W/O FL MOD SED  07/03/2017  . OSTOMY     urostomy  . TONSILLECTOMY AND ADENOIDECTOMY  Remote  . TRANSTHORACIC ECHOCARDIOGRAM  10/16/2017   EF 65-70%, normal wall motion, grd I DD.  [name] TRANSURETHRAL RESECTION OF PROSTATE N/A 04/02/2016   Procedure: CYSTOSCOPY, TRANSURETHRAL RESECTION OF BLADDER TUMOR;  Surgeon: [name], MD;  Location: [facility];  Service: Urology;  Laterality: N/A;  . UMBILICAL HERNIA REPAIR  10/2016  . VASECTOMY      Outpatient  Medications Prior to Visit  Medication Sig Dispense Refill  . cetirizine (ZYRTEC) 10 MG tablet Take 1 tablet (10 mg total) by mouth daily. 30 tablet 11  . Continuous Blood Gluc Sensor (FREESTYLE LIBRE 14 DAY SENSOR) MISC 1 each by Does not apply route daily. 2 each 12  . FERROUS SULFATE PO Take 27 mg by mouth. Takes 1 daily    . insulin aspart (NOVOLOG FLEXPEN) 100 UNIT/ML FlexPen CBG < 70: implement hypoglycemia protocol CBG 70 - 120: 3 units CBG 121 - 150: 5 units CBG 151 - 200: 6 units CBG 201 - 250: 8 units CBG 251 - 300: 11 units CBG 301 - 350: 14 units CBG 351 - 400: 18 units CBG > 400: call MD 15 mL 6  . Insulin Pen Needle (PEN NEEDLES) 32G X 4 MM MISC 1 each by Does not apply route daily. 100 each 11  . Insulin Pen Needle 31G X 5 MM MISC Inject insulin via insulin  pen 3 x daily with meals 100 each 3  . Lancets 30G MISC Use to check blood sugar 4 times daily 100 each 0  . LANTUS SOLOSTAR 100 UNIT/ML Solostar Pen INJECT 15 UNITS SUBQ AT BEDTIME, WILL BE TITRATING 15 mL 3  . lisinopril (PRINIVIL,ZESTRIL) 20 MG tablet TAKE 1 TABLET BY MOUTH TWICE A DAY 180 tablet 1  . Omega-3 Fatty Acids (FISH OIL PO) Take by mouth. Takes 1 daily    . pravastatin (PRAVACHOL) 20 MG tablet Take 1 tablet (20 mg total) by mouth daily. 90 tablet 1  . predniSONE (DELTASONE) 10 MG tablet 4 tabs po qd x 7d, then 2 tabs po qd x 7d, then 1 tab po qd x 7d (Patient not taking: Reported on 03/31/2019) 49 tablet 0   No facility-administered medications prior to visit.     Allergies  Allergen Reactions  . Other Swelling    Pt states had swelling of the eye when having eye surgery and the anesthetic placed in eye     ROS As per HPI  PE: Blood pressure (!) 88/64, pulse 74, temperature 98.8 F (37.1 C), temperature source Temporal, resp. rate 16, height 5\' 7"  (1.702 m), weight 187 lb 6.4 oz (85 kg), SpO2 98 %. Gen: Alert, well appearing.  Patient is oriented to person, place, time, and situation. AFFECT:  pleasant, lucid thought and speech. XBD:ZHGD: no injection, icteris, swelling, or exudate.  EOMI, PERRLA. Mouth: lips without lesion/swelling.  Oral mucosa pink and moist. Oropharynx without erythema, exudate, or swelling.  No JVD. CV: RRR, S1 and S2 slightly distant, 2/6 syst murmur, no diastolic murmur.  No r/g. Chest is clear, no wheezing or rales. Normal symmetric air entry throughout both lung fields. No chest wall deformities or tenderness. Ext: no clubbing or cyanosis.  He has 1 to 2+ L LL pitting edema and trace to 1+ pitting on R LL.  LABS:  Lab Results  Component Value Date   TSH 1.084 10/14/2017   Lab Results  Component Value Date   WBC 5.9 07/28/2018   HGB 12.8 (L) 07/28/2018   HCT 38.6 (L) 07/28/2018   MCV 89.7 07/28/2018   PLT 172.0 07/28/2018   Lab Results  Component Value Date   CREATININE 1.35 12/07/2018   CREATININE 1.35 12/07/2018   BUN 30 (H) 12/07/2018   BUN 30 (H) 12/07/2018   NA 140 12/07/2018   NA 140 12/07/2018   K 5.0 12/07/2018   K 5.0 12/07/2018   CL 108 12/07/2018   CL 108 12/07/2018   CO2 22 12/07/2018   CO2 22 12/07/2018   Lab Results  Component Value Date   ALT 16 12/07/2018   AST 13 12/07/2018   ALKPHOS 88 12/07/2018   BILITOT 0.3 12/07/2018   Lab Results  Component Value Date   CHOL 119 12/07/2018   Lab Results  Component Value Date   HDL 34.90 (L) 12/07/2018   Lab Results  Component Value Date   LDLCALC 60 12/07/2018   Lab Results  Component Value Date   TRIG 116.0 12/07/2018   Lab Results  Component Value Date   CHOLHDL 3 12/07/2018   Lab Results  Component Value Date   HGBA1C 7.4 (H) 12/07/2018    IMPRESSION AND PLAN:  1) Hypotension, unknown cause. Decrease lisinopril to 20mg  ONCE a day instead of bid. Check echo. Check bp and HR daily and bring to office to review in 2 wks. Fortunately, he is asymptomatic.  2) DM2; well controlled as  per his home monitoring. Hardly taking any lantus, and he is taking  NO mealtime insulin. HbA1c today.  3) HLD: tolerating statin.  FLP excellent 12/07/18.  ALT/AST normal at that time as well.  4) CRI with GFR @60  ml/min: avoids NSAIDs, tries to hydrate well. BMET today.  Decreased lisin to 20mg  qd today.  An After Visit Summary was printed and given to the patient.  FOLLOW UP: Return in about 2 weeks (around 04/14/2019) for f/u low bp (in office).  Signed:  Crissie Sickles, MD           03/31/2019

## 2019-04-04 ENCOUNTER — Other Ambulatory Visit: Payer: Self-pay | Admitting: Family Medicine

## 2019-04-05 ENCOUNTER — Other Ambulatory Visit: Payer: Self-pay

## 2019-04-05 ENCOUNTER — Ambulatory Visit (HOSPITAL_COMMUNITY): Payer: Medicare Other | Attending: Cardiology

## 2019-04-05 DIAGNOSIS — I959 Hypotension, unspecified: Secondary | ICD-10-CM | POA: Diagnosis not present

## 2019-04-05 DIAGNOSIS — R011 Cardiac murmur, unspecified: Secondary | ICD-10-CM | POA: Insufficient documentation

## 2019-04-06 ENCOUNTER — Encounter: Payer: Self-pay | Admitting: Family Medicine

## 2019-04-07 ENCOUNTER — Other Ambulatory Visit: Payer: Self-pay

## 2019-04-07 MED ORDER — LANTUS SOLOSTAR 100 UNIT/ML ~~LOC~~ SOPN: PEN_INJECTOR | SUBCUTANEOUS | 3 refills | Status: AC

## 2019-05-17 ENCOUNTER — Other Ambulatory Visit: Payer: Self-pay

## 2019-05-17 ENCOUNTER — Ambulatory Visit (INDEPENDENT_AMBULATORY_CARE_PROVIDER_SITE_OTHER): Payer: Medicare Other | Admitting: Family Medicine

## 2019-05-17 ENCOUNTER — Encounter: Payer: Self-pay | Admitting: Family Medicine

## 2019-05-17 VITALS — BP 121/65 | HR 67 | Temp 98.2°F | Resp 16 | Ht 67.0 in | Wt 193.2 lb

## 2019-05-17 DIAGNOSIS — I952 Hypotension due to drugs: Secondary | ICD-10-CM | POA: Diagnosis not present

## 2019-05-17 MED ORDER — LISINOPRIL 20 MG PO TABS: 20.0000 mg | ORAL_TABLET | Freq: Two times a day (BID) | ORAL | 3 refills | Status: DC

## 2019-05-17 MED ORDER — ASPIRIN EC 81 MG PO TBEC: 81.0000 mg | DELAYED_RELEASE_TABLET | Freq: Every day | ORAL | 0 refills | Status: AC

## 2019-05-17 NOTE — Progress Notes (Signed)
OFFICE VISIT  05/17/2019   CC:  Chief Complaint  Patient presents with  . Follow-up    hypotension   HPI:    Patient is a 83 y.o. Caucasian male who presents for 6 week f/u hypotension. I decreased his lisinopril to 20mg  ONCE daily at last visit. Echo right after that visit showed stable moderate AV stenosis.  He says he feels excellent. Home bp's consistently 120s/60s.  No hypotensive episodes. Glucoses very good: around 85 avg.  2H PP/random gluc typically 140s or less.  No hypoglycemia. Doing very well with diet.  Takes 5-8 U lantus qhs, never has to use any novolog anymore. Does a lot of walking around [place]. No SOB, DOE, or CP.  No dizziness.  He did stop his aspirin 1-2 mo ago when we were discussing his change in lisinopril dosing, says he must have gotten confused about instructions.  Past Medical History:  Diagnosis Date  . Abnormal EKG 2014   This led to stress test which was abnl, which led to cardiology referral: cath showed small vessel dz of 2nd diag branch with 70-80% stenosis--preserved LV function  . Aortic atherosclerosis ([place])    w/out aneurism  . Bilateral renal cysts    Non-complex (CT 03/2016): no enhancement, coarse calcifications, mass effect does give false appearance of hydro on non-contract series (followed by urol).  . Bladder cancer ([place]) 03/2016   Invasive high grade urothelial carcinoma.  No mets at dx.  Cystoscopy + bx and partial resection of tumor in hosp when admitted for gross hematuria and AUR.  Neoadj chemo 06/2016, then got cystoprostatectomy. (Urostomy--ileal conduit urinary diversion with refluxing anastamoses--has RLQ urostomy)-gets routine surveillance by urol (Dr. Tresa Moore), most recent 04/2017--no sign of recurrence--obs status  . CAD (coronary artery disease)    small vessel dz x 1 vessel on cath 2014: med mgmt  . Cerebellar cerebrovascular accident (CVA) without late effect 10/2017   Left, punctate: DAPT therapy x 3 wks per Dr.  Leonie Man, neuro, then ASA 81mg  alone.  Stable as of 11/2017 f/u with neuro, Dr. Erlinda Hong.  . Chronic calcific pancreatitis ([place]) 03/2016   +stable on f/u imaging 11/2018, + 2 small pseudocysts  . Chronic renal insufficiency, stage 2 (mild) 2019   Borderline II/III  . Diabetes mellitus without complication ([place])    Insulin dependent; DKA admission 10/2017.  . GI bleed 12/2017   Presented with melena and Hb around 5.  Transfused 5 U pRBCs, started to get hematochezia in hosp: no clear explanation for GI bleeding was found on EGD or colonoscopy.  CT abd w/out acute abnormality.  Upper GI w/SBFT showed subtle duodenal erosion.  [name] Heart murmur, systolic    Noted in prior PCP's records.  I recommended echocardiogram 07/2016 but pt declined.  [name] History of Bell's palsy   . History of chemotherapy   . Hyperlipidemia   . Hypertension   . IDA (iron deficiency anemia) 12/2017   GI bleed  . Moderate aortic stenosis    Echo 03/2019. Pt asymptomatic.  Rpt 1 yr.   . Pancreas cyst 03/2016   CT abd/pelv 12/2017 for GI bleed w/u showed 1.6 cm pancreatic head cyst-->stable and likely benign.  Abd MRI 11/2018->stable pancreatic pseudocysts + chronic pancreatitis changes. Rpt MRI abd w &w/out contrast 2 yrs (approx 11/2020).  [name] Umbilical hernia    Repaired when he got his bladder surgery.    Past Surgical History:  Procedure Laterality Date  . BIOPSY  12/18/2017   Procedure: BIOPSY;  Surgeon: Doran Stabler, MD;  Location: [facility];  Service: Gastroenterology;;  . CARDIAC CATHETERIZATION  02/09/2013   small vessel dz of 2nd diag branch with 70-80% stenosis--preserved LV function. Medical therapy rec'd. Agustin Cree, PA)  . CARDIOVASCULAR STRESS TEST  01/26/2013   No ischemia.  Wall motion abnormalities at inferior base suggesting small area of infarction.  EF 64%.  . CAROTID ARTERY DOPPLERS  10/15/2017   1-39% stenosis R ICA, no stenosis L ICA.  [name] CATARACT EXTRACTION    . COLONOSCOPY N/A 12/19/2017   Clotted blood  in cecum w/out any underlying abnormality found -->entired colon and terminal ilium normal.  Procedure: COLONOSCOPY;  Surgeon: Doran Stabler, MD;  [facility]: [facility];  Service: Gastroenterology;  Laterality: N/A;  . cystoprostatectomy w/LN surgery  10/17/2016   Ileal conduit: this is an incontinent urine diversion that directs urine from the ureters through a segment of isolated bowel to the surface of the abdominal wall via a cutaneous stoma, and urine drains continuously into an external ostomy appliance.  . CYSTOSCOPY WITH INJECTION N/A 10/17/2016   Procedure: CYSTOSCOPY WITH INJECTION OF INDOCYANINE GREEN DYE;  Surgeon: Alexis Frock, MD;  Location: [facility];  Service: Urology;  Laterality: N/A;  . ESOPHAGOGASTRODUODENOSCOPY N/A 12/18/2017   Mild chronic gastritis, no metaplasia or dysplasia.  H pylori pending as of 12/21/17.  nonbleeding duodenal ulcers.  Procedure: ESOPHAGOGASTRODUODENOSCOPY (EGD);  Surgeon: Doran Stabler, MD;  Location: [facility];  Service: Gastroenterology;  Laterality: N/A;  . EYE SURGERY     cataract surgery bilat   . IR GENERIC HISTORICAL  05/12/2016   IR US GUIDE VASC ACCESS RIGHT 05/12/2016 Greggory Keen, MD [facility]  . IR GENERIC HISTORICAL  05/12/2016   IR FLUORO GUIDE PORT INSERTION RIGHT 05/12/2016 Greggory Keen, MD [facility]  . IR REMOVAL TUN ACCESS W/ PORT W/O FL MOD SED  07/03/2017  . OSTOMY     urostomy  . TONSILLECTOMY AND ADENOIDECTOMY  Remote  . TRANSTHORACIC ECHOCARDIOGRAM  10/16/2017; 03/2019   10/2017 EF 65-70%, normal wall motion, grd I DD.  03/2019 same but also new moderate aortic stenosis. Rpt echo 1 yr.  . TRANSURETHRAL RESECTION OF PROSTATE N/A 04/02/2016   Procedure: CYSTOSCOPY, TRANSURETHRAL RESECTION OF BLADDER TUMOR;  Surgeon: [name], MD;  Location: [facility];  Service: Urology;  Laterality: N/A;  . UMBILICAL HERNIA REPAIR  10/2016  . VASECTOMY      Outpatient Medications Prior to Visit  Medication Sig Dispense  Refill  . cetirizine (ZYRTEC) 10 MG tablet Take 1 tablet (10 mg total) by mouth daily. 30 tablet 11  . Continuous Blood Gluc Sensor (FREESTYLE LIBRE 14 DAY SENSOR) MISC USE AS DIRECTED 2 each 12  . FERROUS SULFATE PO Take 27 mg by mouth. Takes 1 daily    . insulin aspart (NOVOLOG FLEXPEN) 100 UNIT/ML FlexPen CBG < 70: implement hypoglycemia protocol CBG 70 - 120: 3 units CBG 121 - 150: 5 units CBG 151 - 200: 6 units CBG 201 - 250: 8 units CBG 251 - 300: 11 units CBG 301 - 350: 14 units CBG 351 - 400: 18 units CBG > 400: call MD 15 mL 6  . Insulin Glargine (LANTUS SOLOSTAR) 100 UNIT/ML Solostar Pen INJECT 5 UNITS SUBQ AT BEDTIME 15 mL 3  . Insulin Pen Needle (PEN NEEDLES) 32G X 4 MM MISC 1 each by Does not apply route daily. 100 each 11  . Insulin Pen Needle 31G X 5 MM MISC Inject insulin  via insulin pen 3 x daily with meals 100 each 3  . Lancets 30G MISC Use to check blood sugar 4 times daily 100 each 0  . lisinopril (PRINIVIL,ZESTRIL) 20 MG tablet TAKE 1 TABLET BY MOUTH TWICE A DAY 180 tablet 1  . Omega-3 Fatty Acids (FISH OIL PO) Take by mouth. Takes 1 daily    . pravastatin (PRAVACHOL) 20 MG tablet Take 1 tablet (20 mg total) by mouth daily. 90 tablet 1   No facility-administered medications prior to visit.     Allergies  Allergen Reactions  . Other Swelling    Pt states had swelling of the eye when having eye surgery and the anesthetic placed in eye     ROS As per HPI  PE: Blood pressure 121/65, pulse 67, temperature 98.2 F (36.8 C), temperature source Temporal, resp. rate 16, height 5\' 7"  (1.702 m), weight 193 lb 3.2 oz (87.6 kg), SpO2 99 %. Gen: Alert, well appearing.  Patient is oriented to person, place, time, and situation. AFFECT: pleasant, lucid thought and speech. No exam today.  LABS:    Chemistry      Component Value Date/Time   NA 139 03/31/2019 1159   NA 133 (A) 10/03/2017   NA 139 06/18/2017 1125   K 4.4 03/31/2019 1159   K 4.5 06/18/2017 1125   CL  110 03/31/2019 1159   CO2 20 03/31/2019 1159   CO2 27 10/02/2017   CO2 21 (L) 06/18/2017 1125   BUN 25 (H) 03/31/2019 1159   BUN 46 (A) 10/03/2017   BUN 26.5 (H) 06/18/2017 1125   CREATININE 1.38 03/31/2019 1159   CREATININE 1.49 (H) 04/23/2018 1535   CREATININE 1.2 06/18/2017 1125   GLU 525 10/03/2017      Component Value Date/Time   CALCIUM 8.4 03/31/2019 1159   CALCIUM 9.3 06/18/2017 1125   ALKPHOS 88 12/07/2018 1026   ALKPHOS 67 06/18/2017 1125   AST 13 12/07/2018 1026   AST 12 06/18/2017 1125   ALT 16 12/07/2018 1026   ALT 13 06/18/2017 1125   BILITOT 0.3 12/07/2018 1026   BILITOT 0.40 06/18/2017 1125     Lab Results  Component Value Date   WBC 5.9 07/28/2018   HGB 12.8 (L) 07/28/2018   HCT 38.6 (L) 07/28/2018   MCV 89.7 07/28/2018   PLT 172.0 07/28/2018   Lab Results  Component Value Date   CHOL 119 12/07/2018   HDL 34.90 (L) 12/07/2018   LDLCALC 60 12/07/2018   TRIG 116.0 12/07/2018   CHOLHDL 3 12/07/2018   Lab Results  Component Value Date   TSH 1.084 10/14/2017   Lab Results  Component Value Date   HGBA1C 7.6 (H) 03/31/2019    IMPRESSION AND PLAN:  1) Hypotension. No further episodes since decreasing lisinopril dose. No changes today EXCEPT I got him back on ASA 81mg  as secondary prevention for CV/cerebrovasc dz.  2) DM: excellent control. No changes today.  An After Visit Summary was printed and given to the patient.  FOLLOW UP: No follow-ups on file.  Signed:  Crissie Sickles, MD           05/17/2019

## 2019-07-02 ENCOUNTER — Other Ambulatory Visit: Payer: Self-pay | Admitting: Family Medicine

## 2019-07-15 DIAGNOSIS — N184 Chronic kidney disease, stage 4 (severe): Secondary | ICD-10-CM

## 2019-07-15 DIAGNOSIS — N183 Chronic kidney disease, stage 3 unspecified: Secondary | ICD-10-CM

## 2019-07-15 HISTORY — DX: Chronic kidney disease, stage 4 (severe): N18.4

## 2019-07-15 HISTORY — DX: Chronic kidney disease, stage 3 unspecified: N18.30

## 2019-07-22 ENCOUNTER — Ambulatory Visit (INDEPENDENT_AMBULATORY_CARE_PROVIDER_SITE_OTHER): Payer: Medicare Other | Admitting: Medical

## 2019-07-22 ENCOUNTER — Encounter: Payer: Self-pay | Admitting: Medical

## 2019-07-22 VITALS — BP 130/58 | HR 56 | Temp 97.2°F | Ht 67.0 in | Wt 193.0 lb

## 2019-07-22 DIAGNOSIS — Z20822 Contact with and (suspected) exposure to covid-19: Secondary | ICD-10-CM | POA: Diagnosis not present

## 2019-07-22 DIAGNOSIS — H938X9 Other specified disorders of ear, unspecified ear: Secondary | ICD-10-CM

## 2019-07-22 DIAGNOSIS — J40 Bronchitis, not specified as acute or chronic: Secondary | ICD-10-CM | POA: Diagnosis not present

## 2019-07-22 DIAGNOSIS — R05 Cough: Secondary | ICD-10-CM

## 2019-07-22 DIAGNOSIS — R4182 Altered mental status, unspecified: Secondary | ICD-10-CM

## 2019-07-22 DIAGNOSIS — R059 Cough, unspecified: Secondary | ICD-10-CM

## 2019-07-22 MED ORDER — ALBUTEROL SULFATE HFA 108 (90 BASE) MCG/ACT IN AERS: 2.0000 | INHALATION_SPRAY | Freq: Four times a day (QID) | RESPIRATORY_TRACT | 0 refills | Status: DC | PRN

## 2019-07-22 MED ORDER — AZITHROMYCIN 250 MG PO TABS: ORAL_TABLET | ORAL | 0 refills | Status: DC

## 2019-07-22 MED ORDER — BENZONATATE 100 MG PO CAPS: 100.0000 mg | ORAL_CAPSULE | Freq: Three times a day (TID) | ORAL | 0 refills | Status: DC | PRN

## 2019-07-22 NOTE — Progress Notes (Addendum)
Subjective:    Patient ID: Shane [name], male    DOB: [birth year], 84 y.o.   MRN: [id number]  HPI Virtual Visit via Video Note  I connected with Shane [name] on 07/22/19 at 10:40 AM EST by a video enabled telemedicine application and verified that I am speaking with the correct person using two identifiers.  Location: Patient: home Provider: office   I discussed the limitations of evaluation and management by telemedicine and the availability of in person appointments. The patient expressed understanding and agreed to proceed.  History of Present Illness:   Pt has 3 out of 7 family members have had covid. Pt has not been tested. Every body has been tested but Elenore Rota. Daughter states family member test positive on July 16, 2018.   Pt states he currently feels good. He states he feels fine. Pt [place] Pt feels like he is not sob walking around the house. Pt states he tends not take vaccinations. Pt daughter thinks he is a little confused more than normal. Pt thinks his mental status the same.  Pt states left ear has felt blocked some. Some pnd. He states that he had cough on an off for 2 weeks.   Pt states his cough has not worsen over past 4-5 days. But daughter disagrees. Pt daughter states his oxygen 85%, 11 pm. 92% 11:30. Most of night 92%. Presently o2 sat 94%.   Pt sugars recently was 170 11 pm  Though out night was in low 100 range.  When he coughs will occasionally bring up some mucus. No report of any diarrhea, loss of smell or diffuse bodyaches.  Stopped smoking in 70's. He smoke for 2 years at most.     Observations/Objective:   General-no acute distress, pleasant, oriented. Lungs- on inspection lungs appear unlabored. Neck- no tracheal deviation or jvd on inspection. Neuro- gross motor function appears intact.   Assessment and Plan: Has recent history of heavy exposure to various persons and household with  Covid.  Patient's recent signs/symptoms appear to be  moderate and  needs to be watched closely.  Patient is reluctant to get tested for Covid.  With recent cough, intermittent low O2 sats and possible altered mental status, I do think is best for patient to go ahead and start a azithromycin antibiotic, benzonatate for cough and provide albuterol inhaler.  Patient denies any nasal congestion but might have some degree of eustachian tube dysfunction of the left side.  Counseled patient and daughter on watching his mental status.  Sometimes Covid infections can cause mental status changes/Covid fog.  Explained in detail had a watch patient's O2 sats.  Explained numbers that would be worrisome and indicate need for ED evaluation.  Also in general son symptoms worsen/change over weekend then advised ED evaluation as well.   Asked patient to contact PCPs office on Monday to update them on O2 sat, pulse, blood pressure and temperature readings. Also sending this note to PCP.  Follow-up date to be determined based on how he does clinically per update on Monday.  I did also give daughter the number to call to get him scheduled for all Covid test today.  He has probable Covid but definitive positive might be helpful going forward.  Follow Up Instructions:    I discussed the assessment and treatment plan with the patient. The patient was provided an opportunity to ask questions and all were answered. The patient agreed with the plan  and demonstrated an understanding of the instructions.   The patient was advised to call back or seek an in-person evaluation if the symptoms worsen or if the condition fails to improve as anticipated.  I provided 40 + minutes of non-face-to-face time during this encounter.   Mackie Pai, PA-C   Review of Systems  Constitutional: Negative for chills and fever.  HENT: Positive for ear pain and postnasal drip. Negative for congestion.        Left ear pressure.     Respiratory: Positive for cough. Negative for wheezing.        Mild sob possible based on o2 sat %. Thought pt states he states walks around house not sob.  Note some prodcutive cough.  Cardiovascular: Negative for chest pain and palpitations.  Gastrointestinal: Negative for abdominal distention, abdominal pain, constipation and diarrhea.  Genitourinary: Negative for flank pain.  Musculoskeletal: Negative for back pain and myalgias.  Skin: Negative for rash.  Neurological: Negative for dizziness, light-headedness and headaches.  Hematological: Negative for adenopathy. Does not bruise/bleed easily.  Psychiatric/Behavioral: Negative for behavioral problems.       Possible confustion more than baselie per daugher.    Past Medical History:  Diagnosis Date  . Abnormal EKG 2014   This led to stress test which was abnl, which led to cardiology referral: cath showed small vessel dz of 2nd diag branch with 70-80% stenosis--preserved LV function  . Aortic atherosclerosis ([place])    w/out aneurism  . Bilateral renal cysts    Non-complex (CT 03/2016): no enhancement, coarse calcifications, mass effect does give false appearance of hydro on non-contract series (followed by urol).  . Bladder cancer ([place]) 03/2016   Invasive high grade urothelial carcinoma.  No mets at dx.  Cystoscopy + bx and partial resection of tumor in hosp when admitted for gross hematuria and AUR.  Neoadj chemo 06/2016, then got cystoprostatectomy. (Urostomy--ileal conduit urinary diversion with refluxing anastamoses--has RLQ urostomy)-gets routine surveillance by urol (Dr. Tresa Moore), most recent 04/2017--no sign of recurrence--obs status  . CAD (coronary artery disease)    small vessel dz x 1 vessel on cath 2014: med mgmt  . Cerebellar cerebrovascular accident (CVA) without late effect 10/2017   Left, punctate: DAPT therapy x 3 wks per Dr. Leonie Man, neuro, then ASA 81mg  alone.  Stable as of 11/2017 f/u with neuro, Dr. Erlinda Hong.  . Chronic  calcific pancreatitis ([place]) 03/2016   +stable on f/u imaging 11/2018, + 2 small pseudocysts  . Chronic renal insufficiency, stage 2 (mild) 2019   Borderline II/III  . Diabetes mellitus without complication ([place])    Insulin dependent; DKA admission 10/2017.  . GI bleed 12/2017   Presented with melena and Hb around 5.  Transfused 5 U pRBCs, started to get hematochezia in hosp: no clear explanation for GI bleeding was found on EGD or colonoscopy.  CT abd w/out acute abnormality.  Upper GI w/SBFT showed subtle duodenal erosion.  [name] Heart murmur, systolic    Noted in prior PCP's records.  I recommended echocardiogram 07/2016 but pt declined.  [name] History of Bell's palsy   . History of chemotherapy   . Hyperlipidemia   . Hypertension   . IDA (iron deficiency anemia) 12/2017   GI bleed  . Moderate aortic stenosis    Echo 03/2019. Pt asymptomatic.  Rpt 1 yr.   . Pancreas cyst 03/2016   CT abd/pelv 12/2017 for GI bleed w/u showed 1.6 cm pancreatic head cyst-->stable and likely benign.  Abd MRI 11/2018->stable pancreatic  pseudocysts + chronic pancreatitis changes. Rpt MRI abd w &w/out contrast 2 yrs (approx 11/2020).  [name] Umbilical hernia    Repaired when he got his bladder surgery.     Social History   Socioeconomic History  . Marital status: Married    Spouse name: Not on file  . Number of children: 3  . Years of education: Not on file  . Highest education level: Not on file  Occupational History  . Not on file  Tobacco Use  . Smoking status: Former Smoker    Years: 3.00  . Smokeless tobacco: Never Used  Substance and Sexual Activity  . Alcohol use: No  . Drug use: No  . Sexual activity: Not on file  Other Topics Concern  . Not on file  Social History Narrative   Married, 3 children, 7 grandchildren.   Occupation: retired Engineer, site.   No tob, rare alc.   Social Determinants of Health   Financial Resource Strain:   . Difficulty of Paying Living Expenses: Not on file  Food Insecurity:   .  Worried About Charity fundraiser in the Last Year: Not on file  . Ran Out of Food in the Last Year: Not on file  Transportation Needs:   . Lack of Transportation (Medical): Not on file  . Lack of Transportation (Non-Medical): Not on file  Physical Activity:   . Days of Exercise per Week: Not on file  . Minutes of Exercise per Session: Not on file  Stress:   . Feeling of Stress : Not on file  Social Connections:   . Frequency of Communication with Friends and Family: Not on file  . Frequency of Social Gatherings with Friends and Family: Not on file  . Attends Religious Services: Not on file  . Active Member of Clubs or Organizations: Not on file  . Attends Archivist Meetings: Not on file  . Marital Status: Not on file  Intimate Partner Violence:   . Fear of Current or Ex-Partner: Not on file  . Emotionally Abused: Not on file  . Physically Abused: Not on file  . Sexually Abused: Not on file    Past Surgical History:  Procedure Laterality Date  . BIOPSY  12/18/2017   Procedure: BIOPSY;  Surgeon: Doran Stabler, MD;  Location: [facility];  Service: Gastroenterology;;  . CARDIAC CATHETERIZATION  02/09/2013   small vessel dz of 2nd diag branch with 70-80% stenosis--preserved LV function. Medical therapy rec'd. Agustin Cree, PA)  . CARDIOVASCULAR STRESS TEST  01/26/2013   No ischemia.  Wall motion abnormalities at inferior base suggesting small area of infarction.  EF 64%.  . CAROTID ARTERY DOPPLERS  10/15/2017   1-39% stenosis R ICA, no stenosis L ICA.  [name] CATARACT EXTRACTION    . COLONOSCOPY N/A 12/19/2017   Clotted blood in cecum w/out any underlying abnormality found -->entired colon and terminal ilium normal.  Procedure: COLONOSCOPY;  Surgeon: Doran Stabler, MD;  Location: [facility];  Service: Gastroenterology;  Laterality: N/A;  . cystoprostatectomy w/LN surgery  10/17/2016   Ileal conduit: this is an incontinent urine diversion that directs urine from the  ureters through a segment of isolated bowel to the surface of the abdominal wall via a cutaneous stoma, and urine drains continuously into an external ostomy appliance.  . CYSTOSCOPY WITH INJECTION N/A 10/17/2016   Procedure: CYSTOSCOPY WITH INJECTION OF INDOCYANINE GREEN DYE;  Surgeon: Alexis Frock, MD;  Location: [facility];  Service: Urology;  Laterality: N/A;  .  ESOPHAGOGASTRODUODENOSCOPY N/A 12/18/2017   Mild chronic gastritis, no metaplasia or dysplasia.  H pylori pending as of 12/21/17.  nonbleeding duodenal ulcers.  Procedure: ESOPHAGOGASTRODUODENOSCOPY (EGD);  Surgeon: Doran Stabler, MD;  Location: [facility];  Service: Gastroenterology;  Laterality: N/A;  . EYE SURGERY     cataract surgery bilat   . IR GENERIC HISTORICAL  05/12/2016   IR US GUIDE VASC ACCESS RIGHT 05/12/2016 Greggory Keen, MD [facility]  . IR GENERIC HISTORICAL  05/12/2016   IR FLUORO GUIDE PORT INSERTION RIGHT 05/12/2016 Greggory Keen, MD [facility]  . IR REMOVAL TUN ACCESS W/ PORT W/O FL MOD SED  07/03/2017  . OSTOMY     urostomy  . TONSILLECTOMY AND ADENOIDECTOMY  Remote  . TRANSTHORACIC ECHOCARDIOGRAM  10/16/2017; 03/2019   10/2017 EF 65-70%, normal wall motion, grd I DD.  03/2019 same but also new moderate aortic stenosis. Rpt echo 1 yr.  . TRANSURETHRAL RESECTION OF PROSTATE N/A 04/02/2016   Procedure: CYSTOSCOPY, TRANSURETHRAL RESECTION OF BLADDER TUMOR;  Surgeon: [name], MD;  Location: [facility];  Service: Urology;  Laterality: N/A;  . UMBILICAL HERNIA REPAIR  10/2016  . VASECTOMY      Family History  Problem Relation Age of Onset  . Diabetes Mellitus I Mother   . Diabetes Mellitus I Sister   . Stroke Paternal Grandfather     Allergies  Allergen Reactions  . Other Swelling    Pt states had swelling of the eye when having eye surgery and the anesthetic placed in eye     Current Outpatient Medications on File Prior to Visit  Medication Sig Dispense Refill  . aspirin EC 81 MG tablet Take  1 tablet (81 mg total) by mouth daily. 30 tablet 0  . cetirizine (ZYRTEC) 10 MG tablet Take 1 tablet (10 mg total) by mouth daily. 30 tablet 11  . Continuous Blood Gluc Sensor (FREESTYLE LIBRE 14 DAY SENSOR) MISC USE AS DIRECTED 2 each 12  . FERROUS SULFATE PO Take 27 mg by mouth. Takes 1 daily    . insulin aspart (NOVOLOG FLEXPEN) 100 UNIT/ML FlexPen CBG < 70: implement hypoglycemia protocol CBG 70 - 120: 3 units CBG 121 - 150: 5 units CBG 151 - 200: 6 units CBG 201 - 250: 8 units CBG 251 - 300: 11 units CBG 301 - 350: 14 units CBG 351 - 400: 18 units CBG > 400: call MD 15 mL 6  . Insulin Glargine (LANTUS SOLOSTAR) 100 UNIT/ML Solostar Pen INJECT 5 UNITS SUBQ AT BEDTIME 15 mL 3  . Insulin Pen Needle (PEN NEEDLES) 32G X 4 MM MISC 1 each by Does not apply route daily. 100 each 11  . Insulin Pen Needle 31G X 5 MM MISC Inject insulin via insulin pen 3 x daily with meals 100 each 3  . Lancets 30G MISC Use to check blood sugar 4 times daily 100 each 0  . lisinopril (ZESTRIL) 20 MG tablet Take 1 tablet (20 mg total) by mouth 2 (two) times daily. 90 tablet 3  . Omega-3 Fatty Acids (FISH OIL PO) Take by mouth. Takes 1 daily    . pravastatin (PRAVACHOL) 20 MG tablet TAKE 1 TABLET BY MOUTH EVERY DAY 90 tablet 0   No current facility-administered medications on file prior to visit.    BP (!) 130/58 (BP Location: Left Arm, Patient Position: Sitting, Cuff Size: Normal)   Pulse (!) 56   Temp (!) 97.2 F (36.2 C) (Oral)   Ht 5\' 7"  (  1.702 m)   Wt 193 lb (87.5 kg)   SpO2 94% Comment: Grandaughter states patient O2 has been around high 80s  BMI 30.23 kg/m       Objective:   Physical Exam        Assessment & Plan:

## 2019-07-22 NOTE — Patient Instructions (Addendum)
Has recent history of heavy exposure to various persons in household with Covid.  Patient's recent signs/symptoms appear to be  moderate and needs to be watched closely.  Patient is reluctant to get tested for Covid.  With recent cough, intermittent low O2 sats and possible altered mental status, I do think is best for patient to go ahead and start a azithromycin antibiotic, benzonatate for cough and provide albuterol inhaler.  Patient denies any nasal congestion but might have some degree of eustachian tube dysfunction of the left side.  Counseled patient and daughter on watching his mental status.  Sometimes Covid infections can cause mental status changes/Covid fog.  Explained in detail had a watch patient's O2 sats.  Explained numbers that would be worrisome and indicate need for ED evaluation.  Also in general son symptoms worsen/change over weekend then advised ED evaluation as well.   Asked patient to contact PCPs office on Monday to update them on O2 sat, pulse, blood pressure and temperature readings. Also sending this note to PCP.  Follow-up date to be determined based on how he does clinically per update on Monday.  I did also give daughter the number to call to get him scheduled for all Covid test today.  He has probable Covid but definitive positive might be helpful going forward.

## 2019-07-25 ENCOUNTER — Ambulatory Visit: Payer: Medicare Other | Admitting: Medical

## 2019-07-25 ENCOUNTER — Telehealth: Payer: Self-pay

## 2019-07-25 NOTE — Telephone Encounter (Signed)
Spoke tp Ebony Hail regarding Oletta Lamas suggestions for patient to go to ED with fluctuating bp readings. Ebony Hail was reluctant to go to ED stated she would push fluids for patient and see if that helps. Advised of low bp and risk and due to COVID exposure patient is considered high risk due to hx. She advised she was adamant about not taking patient in to ED rather give fluids at this time. Will reach out to discuss with PCP office.

## 2019-07-25 NOTE — Telephone Encounter (Signed)
Call 911 or go immediately to nearest emergency department.

## 2019-07-25 NOTE — Telephone Encounter (Signed)
FYI-Pt's daughter, Ebony Hail returned call and mentioned EMT arrived yesterday, checked his lungs and they were clear. She did not give him lisinopril this morning but BP increased to 97/61 w/ HR of 61 and O2 to 95%. He has not c/o SOB. He was given z-pack and benzonatate that have been helping his cough.

## 2019-07-25 NOTE — Telephone Encounter (Signed)
OK.  Don't give bp med unless bp rises up above 140/90 for 2 consecutive days. Also, until he feels back to normal he can hold his pravastatin. Encourage intake of at least 65 ounces of clear fluids every day.

## 2019-07-25 NOTE — Telephone Encounter (Signed)
Pt had virtual visit with Shane [name] on 07/22/19 for exposure to [place]. Pt's O2 during visit was 94 and since has decreased to 83. BP is now 76/46. Pt's granddaughter declined taking him to ED but he has been having SOB as well. Pt declined being tested for covid. They would like recommendations from PCP for next steps.  Please advise, thanks.

## 2019-07-25 NOTE — Telephone Encounter (Signed)
Spoke with granddaughter Ebony Hail she stated that she had to contact Emergency services Sunday due to patients low bp. Patients bp was 76/46. When the EMT came out to check vitals, bp went up to 98/56. She stated patient is able to keep fluid down. Sunday night patient was having bouts of coughing really bad bp 114/53 O2 was 89. She stated she had to give patient his rescue inhaler. This morning she checked again bp 91/53 O2 97 p 77. She stated she would like to know can patient come off Lisinopril to see if that is causing patient to have decline in bp?Please advise

## 2019-07-25 NOTE — Telephone Encounter (Signed)
LM for pt to returncall

## 2019-07-25 NOTE — Telephone Encounter (Signed)
Pt had recent exposure to covid. I gave information to get tested and azithromycin. But appears to have worsened over the weekend with low bp and varying low o2 sat levels. I think needs  ED evaluationI  at [facility].  If they express reluctance to got to ED let me know. Then we need to inform pcp office and see if he can get virtual visit with them.

## 2019-07-26 NOTE — Progress Notes (Signed)
Noted. Pt's daughter has continued to call and update [place] and ask for advice. Due to ongoing low oxygen (down to 85-88% on RA), I have repeatedly recommended pt go to ED. However, with new availability of remote home health care/monitoring of covid/suspected covid patients, pt does qualify for this and we'll work on getting this arranged. Signed:  Crissie Sickles, MD           07/26/2019

## 2019-07-26 NOTE — Telephone Encounter (Signed)
Spoke with pt's daughter, Ebony Hail and advised of recommended. She did let me know EMT came to the house last night and recommended he get RX for oxygen to use at home. His o2 stays around 90-92. It was 88 this morning and after giving him 2 puffs of albuterol inhaler increased to 92. This is the highest it has been in 3 days.  Please advise if office visit needed. (virtual or in office)

## 2019-07-26 NOTE — Telephone Encounter (Signed)
I need to have you reiterate STRONGLY to pt and caregivers that he needs to get a covid test to confirm that this is covid.  If positive test, then he getting him on oxygen at home for this illness would be possible. If he chooses NOT to get covid test or if test is negative, then he needs to get evaluated in the ED b/c of his low oxygen readings.-thx

## 2019-07-26 NOTE — Telephone Encounter (Signed)
Patient's daughter, Ebony Hail advised and voiced understanding.

## 2019-08-06 DIAGNOSIS — R918 Other nonspecific abnormal finding of lung field: Secondary | ICD-10-CM | POA: Diagnosis not present

## 2019-08-06 DIAGNOSIS — I517 Cardiomegaly: Secondary | ICD-10-CM | POA: Diagnosis not present

## 2019-08-23 DIAGNOSIS — E119 Type 2 diabetes mellitus without complications: Secondary | ICD-10-CM | POA: Diagnosis not present

## 2019-08-23 DIAGNOSIS — I10 Essential (primary) hypertension: Secondary | ICD-10-CM | POA: Diagnosis not present

## 2019-08-31 DIAGNOSIS — R509 Fever, unspecified: Secondary | ICD-10-CM | POA: Diagnosis not present

## 2019-09-01 DIAGNOSIS — J9690 Respiratory failure, unspecified, unspecified whether with hypoxia or hypercapnia: Secondary | ICD-10-CM | POA: Diagnosis not present

## 2019-09-30 ENCOUNTER — Ambulatory Visit: Payer: Medicare Other | Attending: Internal Medicine

## 2019-09-30 DIAGNOSIS — Z23 Encounter for immunization: Secondary | ICD-10-CM

## 2019-09-30 NOTE — Progress Notes (Signed)
   Covid-19 Vaccination Clinic  Name:  Shane [name]    MRN: [id number] DOB: [birth year]  09/30/2019  [name] was observed post Covid-19 immunization for 15 minutes without incident. He was provided with Vaccine Information Sheet and instruction to access the V-Safe system.   [name] was instructed to call 911 with any severe reactions post vaccine: [name] Difficulty breathing  . Swelling of face and throat  . A fast heartbeat  . A bad rash all over body  . Dizziness and weakness   Immunizations Administered    Name Date Dose VIS Date Route   Pfizer COVID-19 Vaccine 09/30/2019 12:57 PM 0.3 mL 06/24/2019 Intramuscular   Manufacturer: [place]   Lot: [id number]   [place]: [address]-2

## 2019-10-03 ENCOUNTER — Other Ambulatory Visit: Payer: Self-pay | Admitting: Family Medicine

## 2019-10-26 ENCOUNTER — Ambulatory Visit: Payer: Medicare Other | Attending: Internal Medicine

## 2019-10-26 DIAGNOSIS — Z23 Encounter for immunization: Secondary | ICD-10-CM

## 2019-10-26 NOTE — Progress Notes (Signed)
   Covid-19 Vaccination Clinic  Name:  Shane [name]    MRN: [id number] DOB: [birth year]  10/26/2019  Shane [name] was observed post Covid-19 immunization for 15 minutes without incident. He was provided with Vaccine Information Sheet and instruction to access the V-Safe system.   Shane [name] was instructed to call 911 with any severe reactions post vaccine: [name] Difficulty breathing  . Swelling of face and throat  . A fast heartbeat  . A bad rash all over body  . Dizziness and weakness   Immunizations Administered    Name Date Dose VIS Date Route   Pfizer COVID-19 Vaccine 10/26/2019 12:50 PM 0.3 mL 06/24/2019 Intramuscular   Manufacturer: [place]   Lot: [id number]   [place]: [id number]

## 2019-10-31 ENCOUNTER — Other Ambulatory Visit: Payer: Self-pay | Admitting: Family Medicine

## 2019-11-25 ENCOUNTER — Other Ambulatory Visit: Payer: Self-pay | Admitting: Family Medicine

## 2019-11-25 NOTE — Telephone Encounter (Signed)
Patient over due for follow up ( due January, 2021 )  [facility] for pt to CB to schedule so we can send in short term supply.

## 2019-12-22 DIAGNOSIS — I10 Essential (primary) hypertension: Secondary | ICD-10-CM | POA: Diagnosis not present

## 2019-12-22 DIAGNOSIS — I700 Atherosclerosis of aorta: Secondary | ICD-10-CM | POA: Diagnosis not present

## 2019-12-22 DIAGNOSIS — E119 Type 2 diabetes mellitus without complications: Secondary | ICD-10-CM | POA: Diagnosis not present

## 2020-01-25 DIAGNOSIS — J984 Other disorders of lung: Secondary | ICD-10-CM | POA: Diagnosis not present

## 2020-01-25 DIAGNOSIS — I10 Essential (primary) hypertension: Secondary | ICD-10-CM | POA: Diagnosis not present

## 2020-02-06 DIAGNOSIS — E119 Type 2 diabetes mellitus without complications: Secondary | ICD-10-CM | POA: Diagnosis not present

## 2020-02-06 DIAGNOSIS — I10 Essential (primary) hypertension: Secondary | ICD-10-CM | POA: Diagnosis not present

## 2020-02-06 DIAGNOSIS — I700 Atherosclerosis of aorta: Secondary | ICD-10-CM | POA: Diagnosis not present

## 2020-02-08 DIAGNOSIS — Z466 Encounter for fitting and adjustment of urinary device: Secondary | ICD-10-CM | POA: Diagnosis not present

## 2020-02-08 DIAGNOSIS — I69359 Hemiplegia and hemiparesis following cerebral infarction affecting unspecified side: Secondary | ICD-10-CM | POA: Diagnosis not present

## 2020-02-08 DIAGNOSIS — D509 Iron deficiency anemia, unspecified: Secondary | ICD-10-CM | POA: Diagnosis not present

## 2020-02-08 DIAGNOSIS — E785 Hyperlipidemia, unspecified: Secondary | ICD-10-CM | POA: Diagnosis not present

## 2020-02-08 DIAGNOSIS — I700 Atherosclerosis of aorta: Secondary | ICD-10-CM | POA: Diagnosis not present

## 2020-02-08 DIAGNOSIS — I10 Essential (primary) hypertension: Secondary | ICD-10-CM | POA: Diagnosis not present

## 2020-02-08 DIAGNOSIS — I69318 Other symptoms and signs involving cognitive functions following cerebral infarction: Secondary | ICD-10-CM | POA: Diagnosis not present

## 2020-02-08 DIAGNOSIS — G510 Bell's palsy: Secondary | ICD-10-CM | POA: Diagnosis not present

## 2020-02-08 DIAGNOSIS — Z7951 Long term (current) use of inhaled steroids: Secondary | ICD-10-CM | POA: Diagnosis not present

## 2020-02-08 DIAGNOSIS — Z8616 Personal history of COVID-19: Secondary | ICD-10-CM | POA: Diagnosis not present

## 2020-02-08 DIAGNOSIS — N281 Cyst of kidney, acquired: Secondary | ICD-10-CM | POA: Diagnosis not present

## 2020-02-08 DIAGNOSIS — Z794 Long term (current) use of insulin: Secondary | ICD-10-CM | POA: Diagnosis not present

## 2020-02-08 DIAGNOSIS — E119 Type 2 diabetes mellitus without complications: Secondary | ICD-10-CM | POA: Diagnosis not present

## 2020-02-08 DIAGNOSIS — I2510 Atherosclerotic heart disease of native coronary artery without angina pectoris: Secondary | ICD-10-CM | POA: Diagnosis not present

## 2020-02-08 DIAGNOSIS — Z8551 Personal history of malignant neoplasm of bladder: Secondary | ICD-10-CM | POA: Diagnosis not present

## 2020-02-08 DIAGNOSIS — Z87440 Personal history of urinary (tract) infections: Secondary | ICD-10-CM | POA: Diagnosis not present

## 2020-02-08 DIAGNOSIS — Z9181 History of falling: Secondary | ICD-10-CM | POA: Diagnosis not present

## 2020-02-16 DIAGNOSIS — I69359 Hemiplegia and hemiparesis following cerebral infarction affecting unspecified side: Secondary | ICD-10-CM | POA: Diagnosis not present

## 2020-02-16 DIAGNOSIS — I2510 Atherosclerotic heart disease of native coronary artery without angina pectoris: Secondary | ICD-10-CM | POA: Diagnosis not present

## 2020-02-16 DIAGNOSIS — I69318 Other symptoms and signs involving cognitive functions following cerebral infarction: Secondary | ICD-10-CM | POA: Diagnosis not present

## 2020-02-16 DIAGNOSIS — E119 Type 2 diabetes mellitus without complications: Secondary | ICD-10-CM | POA: Diagnosis not present

## 2020-02-16 DIAGNOSIS — G510 Bell's palsy: Secondary | ICD-10-CM | POA: Diagnosis not present

## 2020-02-16 DIAGNOSIS — I10 Essential (primary) hypertension: Secondary | ICD-10-CM | POA: Diagnosis not present

## 2020-02-21 DIAGNOSIS — E119 Type 2 diabetes mellitus without complications: Secondary | ICD-10-CM | POA: Diagnosis not present

## 2020-02-21 DIAGNOSIS — I69318 Other symptoms and signs involving cognitive functions following cerebral infarction: Secondary | ICD-10-CM | POA: Diagnosis not present

## 2020-02-21 DIAGNOSIS — I69359 Hemiplegia and hemiparesis following cerebral infarction affecting unspecified side: Secondary | ICD-10-CM | POA: Diagnosis not present

## 2020-02-21 DIAGNOSIS — G510 Bell's palsy: Secondary | ICD-10-CM | POA: Diagnosis not present

## 2020-02-21 DIAGNOSIS — I10 Essential (primary) hypertension: Secondary | ICD-10-CM | POA: Diagnosis not present

## 2020-02-21 DIAGNOSIS — I2510 Atherosclerotic heart disease of native coronary artery without angina pectoris: Secondary | ICD-10-CM | POA: Diagnosis not present

## 2020-02-23 ENCOUNTER — Other Ambulatory Visit: Payer: Self-pay | Admitting: Family Medicine

## 2020-02-23 DIAGNOSIS — G510 Bell's palsy: Secondary | ICD-10-CM | POA: Diagnosis not present

## 2020-02-23 DIAGNOSIS — I69359 Hemiplegia and hemiparesis following cerebral infarction affecting unspecified side: Secondary | ICD-10-CM | POA: Diagnosis not present

## 2020-02-23 DIAGNOSIS — I10 Essential (primary) hypertension: Secondary | ICD-10-CM | POA: Diagnosis not present

## 2020-02-23 DIAGNOSIS — E119 Type 2 diabetes mellitus without complications: Secondary | ICD-10-CM | POA: Diagnosis not present

## 2020-02-23 DIAGNOSIS — I69318 Other symptoms and signs involving cognitive functions following cerebral infarction: Secondary | ICD-10-CM | POA: Diagnosis not present

## 2020-02-23 DIAGNOSIS — I2510 Atherosclerotic heart disease of native coronary artery without angina pectoris: Secondary | ICD-10-CM | POA: Diagnosis not present

## 2020-02-24 ENCOUNTER — Telehealth: Payer: Self-pay

## 2020-02-24 NOTE — Telephone Encounter (Signed)
Pt needs to schedule an appointment before any medication refills can be done.

## 2020-02-27 DIAGNOSIS — I69359 Hemiplegia and hemiparesis following cerebral infarction affecting unspecified side: Secondary | ICD-10-CM | POA: Diagnosis not present

## 2020-02-27 DIAGNOSIS — I2510 Atherosclerotic heart disease of native coronary artery without angina pectoris: Secondary | ICD-10-CM | POA: Diagnosis not present

## 2020-02-27 DIAGNOSIS — I10 Essential (primary) hypertension: Secondary | ICD-10-CM | POA: Diagnosis not present

## 2020-02-27 DIAGNOSIS — I69318 Other symptoms and signs involving cognitive functions following cerebral infarction: Secondary | ICD-10-CM | POA: Diagnosis not present

## 2020-02-27 DIAGNOSIS — E119 Type 2 diabetes mellitus without complications: Secondary | ICD-10-CM | POA: Diagnosis not present

## 2020-02-27 DIAGNOSIS — G510 Bell's palsy: Secondary | ICD-10-CM | POA: Diagnosis not present

## 2020-02-28 DIAGNOSIS — I69318 Other symptoms and signs involving cognitive functions following cerebral infarction: Secondary | ICD-10-CM | POA: Diagnosis not present

## 2020-02-28 DIAGNOSIS — I10 Essential (primary) hypertension: Secondary | ICD-10-CM | POA: Diagnosis not present

## 2020-02-28 DIAGNOSIS — E119 Type 2 diabetes mellitus without complications: Secondary | ICD-10-CM | POA: Diagnosis not present

## 2020-02-28 DIAGNOSIS — I69359 Hemiplegia and hemiparesis following cerebral infarction affecting unspecified side: Secondary | ICD-10-CM | POA: Diagnosis not present

## 2020-02-28 DIAGNOSIS — I2510 Atherosclerotic heart disease of native coronary artery without angina pectoris: Secondary | ICD-10-CM | POA: Diagnosis not present

## 2020-02-28 DIAGNOSIS — G510 Bell's palsy: Secondary | ICD-10-CM | POA: Diagnosis not present

## 2020-03-01 DIAGNOSIS — I2510 Atherosclerotic heart disease of native coronary artery without angina pectoris: Secondary | ICD-10-CM | POA: Diagnosis not present

## 2020-03-01 DIAGNOSIS — E119 Type 2 diabetes mellitus without complications: Secondary | ICD-10-CM | POA: Diagnosis not present

## 2020-03-01 DIAGNOSIS — I69318 Other symptoms and signs involving cognitive functions following cerebral infarction: Secondary | ICD-10-CM | POA: Diagnosis not present

## 2020-03-01 DIAGNOSIS — I69359 Hemiplegia and hemiparesis following cerebral infarction affecting unspecified side: Secondary | ICD-10-CM | POA: Diagnosis not present

## 2020-03-01 DIAGNOSIS — I10 Essential (primary) hypertension: Secondary | ICD-10-CM | POA: Diagnosis not present

## 2020-03-01 DIAGNOSIS — G510 Bell's palsy: Secondary | ICD-10-CM | POA: Diagnosis not present

## 2020-03-02 DIAGNOSIS — E119 Type 2 diabetes mellitus without complications: Secondary | ICD-10-CM | POA: Diagnosis not present

## 2020-03-02 DIAGNOSIS — I69359 Hemiplegia and hemiparesis following cerebral infarction affecting unspecified side: Secondary | ICD-10-CM | POA: Diagnosis not present

## 2020-03-02 DIAGNOSIS — I10 Essential (primary) hypertension: Secondary | ICD-10-CM | POA: Diagnosis not present

## 2020-03-02 DIAGNOSIS — G510 Bell's palsy: Secondary | ICD-10-CM | POA: Diagnosis not present

## 2020-03-02 DIAGNOSIS — I2510 Atherosclerotic heart disease of native coronary artery without angina pectoris: Secondary | ICD-10-CM | POA: Diagnosis not present

## 2020-03-02 DIAGNOSIS — I69318 Other symptoms and signs involving cognitive functions following cerebral infarction: Secondary | ICD-10-CM | POA: Diagnosis not present

## 2020-03-05 ENCOUNTER — Other Ambulatory Visit: Payer: Self-pay | Admitting: Family Medicine

## 2020-03-06 DIAGNOSIS — I2510 Atherosclerotic heart disease of native coronary artery without angina pectoris: Secondary | ICD-10-CM | POA: Diagnosis not present

## 2020-03-06 DIAGNOSIS — E119 Type 2 diabetes mellitus without complications: Secondary | ICD-10-CM | POA: Diagnosis not present

## 2020-03-06 DIAGNOSIS — G510 Bell's palsy: Secondary | ICD-10-CM | POA: Diagnosis not present

## 2020-03-06 DIAGNOSIS — I69318 Other symptoms and signs involving cognitive functions following cerebral infarction: Secondary | ICD-10-CM | POA: Diagnosis not present

## 2020-03-06 DIAGNOSIS — I69359 Hemiplegia and hemiparesis following cerebral infarction affecting unspecified side: Secondary | ICD-10-CM | POA: Diagnosis not present

## 2020-03-06 DIAGNOSIS — I10 Essential (primary) hypertension: Secondary | ICD-10-CM | POA: Diagnosis not present

## 2020-03-08 DIAGNOSIS — G510 Bell's palsy: Secondary | ICD-10-CM | POA: Diagnosis not present

## 2020-03-08 DIAGNOSIS — E119 Type 2 diabetes mellitus without complications: Secondary | ICD-10-CM | POA: Diagnosis not present

## 2020-03-08 DIAGNOSIS — I69359 Hemiplegia and hemiparesis following cerebral infarction affecting unspecified side: Secondary | ICD-10-CM | POA: Diagnosis not present

## 2020-03-08 DIAGNOSIS — I10 Essential (primary) hypertension: Secondary | ICD-10-CM | POA: Diagnosis not present

## 2020-03-08 DIAGNOSIS — I2510 Atherosclerotic heart disease of native coronary artery without angina pectoris: Secondary | ICD-10-CM | POA: Diagnosis not present

## 2020-03-08 DIAGNOSIS — I69318 Other symptoms and signs involving cognitive functions following cerebral infarction: Secondary | ICD-10-CM | POA: Diagnosis not present

## 2020-03-09 DIAGNOSIS — Z7951 Long term (current) use of inhaled steroids: Secondary | ICD-10-CM | POA: Diagnosis not present

## 2020-03-09 DIAGNOSIS — E785 Hyperlipidemia, unspecified: Secondary | ICD-10-CM | POA: Diagnosis not present

## 2020-03-09 DIAGNOSIS — Z8551 Personal history of malignant neoplasm of bladder: Secondary | ICD-10-CM | POA: Diagnosis not present

## 2020-03-09 DIAGNOSIS — Z8616 Personal history of COVID-19: Secondary | ICD-10-CM | POA: Diagnosis not present

## 2020-03-09 DIAGNOSIS — E119 Type 2 diabetes mellitus without complications: Secondary | ICD-10-CM | POA: Diagnosis not present

## 2020-03-09 DIAGNOSIS — I69359 Hemiplegia and hemiparesis following cerebral infarction affecting unspecified side: Secondary | ICD-10-CM | POA: Diagnosis not present

## 2020-03-09 DIAGNOSIS — I700 Atherosclerosis of aorta: Secondary | ICD-10-CM | POA: Diagnosis not present

## 2020-03-09 DIAGNOSIS — G510 Bell's palsy: Secondary | ICD-10-CM | POA: Diagnosis not present

## 2020-03-09 DIAGNOSIS — Z87440 Personal history of urinary (tract) infections: Secondary | ICD-10-CM | POA: Diagnosis not present

## 2020-03-09 DIAGNOSIS — I69318 Other symptoms and signs involving cognitive functions following cerebral infarction: Secondary | ICD-10-CM | POA: Diagnosis not present

## 2020-03-09 DIAGNOSIS — I2510 Atherosclerotic heart disease of native coronary artery without angina pectoris: Secondary | ICD-10-CM | POA: Diagnosis not present

## 2020-03-09 DIAGNOSIS — I10 Essential (primary) hypertension: Secondary | ICD-10-CM | POA: Diagnosis not present

## 2020-03-09 DIAGNOSIS — D509 Iron deficiency anemia, unspecified: Secondary | ICD-10-CM | POA: Diagnosis not present

## 2020-03-09 DIAGNOSIS — Z9181 History of falling: Secondary | ICD-10-CM | POA: Diagnosis not present

## 2020-03-09 DIAGNOSIS — Z794 Long term (current) use of insulin: Secondary | ICD-10-CM | POA: Diagnosis not present

## 2020-03-09 DIAGNOSIS — Z466 Encounter for fitting and adjustment of urinary device: Secondary | ICD-10-CM | POA: Diagnosis not present

## 2020-03-09 DIAGNOSIS — N281 Cyst of kidney, acquired: Secondary | ICD-10-CM | POA: Diagnosis not present

## 2020-03-14 DIAGNOSIS — N200 Calculus of kidney: Secondary | ICD-10-CM

## 2020-03-14 DIAGNOSIS — A419 Sepsis, unspecified organism: Secondary | ICD-10-CM

## 2020-03-14 HISTORY — DX: Sepsis, unspecified organism: A41.9

## 2020-03-14 HISTORY — DX: Calculus of kidney: N20.0

## 2020-03-20 DIAGNOSIS — H02831 Dermatochalasis of right upper eyelid: Secondary | ICD-10-CM | POA: Diagnosis not present

## 2020-03-20 DIAGNOSIS — E119 Type 2 diabetes mellitus without complications: Secondary | ICD-10-CM | POA: Diagnosis not present

## 2020-03-20 DIAGNOSIS — H527 Unspecified disorder of refraction: Secondary | ICD-10-CM | POA: Diagnosis not present

## 2020-03-20 DIAGNOSIS — H35413 Lattice degeneration of retina, bilateral: Secondary | ICD-10-CM | POA: Diagnosis not present

## 2020-03-20 DIAGNOSIS — H02834 Dermatochalasis of left upper eyelid: Secondary | ICD-10-CM | POA: Diagnosis not present

## 2020-03-20 DIAGNOSIS — H43813 Vitreous degeneration, bilateral: Secondary | ICD-10-CM | POA: Diagnosis not present

## 2020-03-21 DIAGNOSIS — I69359 Hemiplegia and hemiparesis following cerebral infarction affecting unspecified side: Secondary | ICD-10-CM | POA: Diagnosis not present

## 2020-03-21 DIAGNOSIS — G510 Bell's palsy: Secondary | ICD-10-CM | POA: Diagnosis not present

## 2020-03-21 DIAGNOSIS — I69318 Other symptoms and signs involving cognitive functions following cerebral infarction: Secondary | ICD-10-CM | POA: Diagnosis not present

## 2020-03-21 DIAGNOSIS — E119 Type 2 diabetes mellitus without complications: Secondary | ICD-10-CM | POA: Diagnosis not present

## 2020-03-21 DIAGNOSIS — I2510 Atherosclerotic heart disease of native coronary artery without angina pectoris: Secondary | ICD-10-CM | POA: Diagnosis not present

## 2020-03-21 DIAGNOSIS — I10 Essential (primary) hypertension: Secondary | ICD-10-CM | POA: Diagnosis not present

## 2020-04-08 ENCOUNTER — Inpatient Hospital Stay (HOSPITAL_COMMUNITY)
Admission: EM | Admit: 2020-04-08 | Discharge: 2020-04-17 | DRG: 871 | Disposition: A | Discharge status: Home Health | Payer: Medicare Other | Attending: Family Medicine | Admitting: Family Medicine

## 2020-04-08 ENCOUNTER — Encounter (HOSPITAL_COMMUNITY): Payer: Self-pay | Admitting: Emergency Medicine

## 2020-04-08 ENCOUNTER — Emergency Department (HOSPITAL_COMMUNITY): Payer: Medicare Other

## 2020-04-08 ENCOUNTER — Other Ambulatory Visit: Payer: Self-pay

## 2020-04-08 DIAGNOSIS — Z8551 Personal history of malignant neoplasm of bladder: Secondary | ICD-10-CM

## 2020-04-08 DIAGNOSIS — I083 Combined rheumatic disorders of mitral, aortic and tricuspid valves: Secondary | ICD-10-CM | POA: Diagnosis present

## 2020-04-08 DIAGNOSIS — N170 Acute kidney failure with tubular necrosis: Secondary | ICD-10-CM | POA: Diagnosis present

## 2020-04-08 DIAGNOSIS — Z936 Other artificial openings of urinary tract status: Secondary | ICD-10-CM

## 2020-04-08 DIAGNOSIS — E872 Acidosis, unspecified: Secondary | ICD-10-CM

## 2020-04-08 DIAGNOSIS — E874 Mixed disorder of acid-base balance: Secondary | ICD-10-CM | POA: Diagnosis present

## 2020-04-08 DIAGNOSIS — E876 Hypokalemia: Secondary | ICD-10-CM | POA: Diagnosis present

## 2020-04-08 DIAGNOSIS — Z9221 Personal history of antineoplastic chemotherapy: Secondary | ICD-10-CM

## 2020-04-08 DIAGNOSIS — N179 Acute kidney failure, unspecified: Secondary | ICD-10-CM

## 2020-04-08 DIAGNOSIS — R7881 Bacteremia: Secondary | ICD-10-CM

## 2020-04-08 DIAGNOSIS — Z87891 Personal history of nicotine dependence: Secondary | ICD-10-CM

## 2020-04-08 DIAGNOSIS — A419 Sepsis, unspecified organism: Secondary | ICD-10-CM

## 2020-04-08 DIAGNOSIS — E119 Type 2 diabetes mellitus without complications: Secondary | ICD-10-CM

## 2020-04-08 DIAGNOSIS — R404 Transient alteration of awareness: Secondary | ICD-10-CM | POA: Diagnosis not present

## 2020-04-08 DIAGNOSIS — J181 Lobar pneumonia, unspecified organism: Secondary | ICD-10-CM | POA: Diagnosis not present

## 2020-04-08 DIAGNOSIS — Z20822 Contact with and (suspected) exposure to covid-19: Secondary | ICD-10-CM | POA: Diagnosis present

## 2020-04-08 DIAGNOSIS — Z9889 Other specified postprocedural states: Secondary | ICD-10-CM

## 2020-04-08 DIAGNOSIS — A4181 Sepsis due to Enterococcus: Secondary | ICD-10-CM | POA: Diagnosis not present

## 2020-04-08 DIAGNOSIS — Z7982 Long term (current) use of aspirin: Secondary | ICD-10-CM

## 2020-04-08 DIAGNOSIS — N390 Urinary tract infection, site not specified: Secondary | ICD-10-CM | POA: Diagnosis present

## 2020-04-08 DIAGNOSIS — Z8673 Personal history of transient ischemic attack (TIA), and cerebral infarction without residual deficits: Secondary | ICD-10-CM

## 2020-04-08 DIAGNOSIS — N136 Pyonephrosis: Secondary | ICD-10-CM | POA: Diagnosis present

## 2020-04-08 DIAGNOSIS — R918 Other nonspecific abnormal finding of lung field: Secondary | ICD-10-CM | POA: Diagnosis not present

## 2020-04-08 DIAGNOSIS — Z043 Encounter for examination and observation following other accident: Secondary | ICD-10-CM | POA: Diagnosis not present

## 2020-04-08 DIAGNOSIS — Z8616 Personal history of COVID-19: Secondary | ICD-10-CM

## 2020-04-08 DIAGNOSIS — I4460 Unspecified fascicular block: Secondary | ICD-10-CM | POA: Diagnosis not present

## 2020-04-08 DIAGNOSIS — W19XXXA Unspecified fall, initial encounter: Secondary | ICD-10-CM

## 2020-04-08 DIAGNOSIS — I2510 Atherosclerotic heart disease of native coronary artery without angina pectoris: Secondary | ICD-10-CM | POA: Diagnosis present

## 2020-04-08 DIAGNOSIS — R6520 Severe sepsis without septic shock: Secondary | ICD-10-CM

## 2020-04-08 DIAGNOSIS — Z794 Long term (current) use of insulin: Secondary | ICD-10-CM

## 2020-04-08 DIAGNOSIS — I700 Atherosclerosis of aorta: Secondary | ICD-10-CM | POA: Diagnosis present

## 2020-04-08 DIAGNOSIS — E1122 Type 2 diabetes mellitus with diabetic chronic kidney disease: Secondary | ICD-10-CM | POA: Diagnosis present

## 2020-04-08 DIAGNOSIS — I129 Hypertensive chronic kidney disease with stage 1 through stage 4 chronic kidney disease, or unspecified chronic kidney disease: Secondary | ICD-10-CM | POA: Diagnosis present

## 2020-04-08 DIAGNOSIS — I491 Atrial premature depolarization: Secondary | ICD-10-CM | POA: Diagnosis not present

## 2020-04-08 DIAGNOSIS — R0902 Hypoxemia: Secondary | ICD-10-CM | POA: Diagnosis not present

## 2020-04-08 DIAGNOSIS — B952 Enterococcus as the cause of diseases classified elsewhere: Secondary | ICD-10-CM | POA: Diagnosis present

## 2020-04-08 DIAGNOSIS — Z823 Family history of stroke: Secondary | ICD-10-CM

## 2020-04-08 DIAGNOSIS — R000 Tachycardia, unspecified: Secondary | ICD-10-CM | POA: Diagnosis not present

## 2020-04-08 DIAGNOSIS — E785 Hyperlipidemia, unspecified: Secondary | ICD-10-CM | POA: Diagnosis present

## 2020-04-08 DIAGNOSIS — K861 Other chronic pancreatitis: Secondary | ICD-10-CM | POA: Diagnosis present

## 2020-04-08 DIAGNOSIS — Z833 Family history of diabetes mellitus: Secondary | ICD-10-CM

## 2020-04-08 DIAGNOSIS — N1831 Chronic kidney disease, stage 3a: Secondary | ICD-10-CM | POA: Diagnosis present

## 2020-04-08 DIAGNOSIS — Z79899 Other long term (current) drug therapy: Secondary | ICD-10-CM

## 2020-04-08 DIAGNOSIS — R509 Fever, unspecified: Secondary | ICD-10-CM

## 2020-04-08 DIAGNOSIS — R0689 Other abnormalities of breathing: Secondary | ICD-10-CM | POA: Diagnosis not present

## 2020-04-08 DIAGNOSIS — I10 Essential (primary) hypertension: Secondary | ICD-10-CM | POA: Diagnosis present

## 2020-04-08 DIAGNOSIS — E860 Dehydration: Secondary | ICD-10-CM | POA: Diagnosis not present

## 2020-04-08 DIAGNOSIS — C679 Malignant neoplasm of bladder, unspecified: Secondary | ICD-10-CM | POA: Diagnosis present

## 2020-04-08 LAB — CBC WITH DIFFERENTIAL/PLATELET
Abs Immature Granulocytes: 0.21 10*3/uL — ABNORMAL HIGH (ref 0.00–0.07)
Basophils Absolute: 0 10*3/uL (ref 0.0–0.1)
Basophils Relative: 0 %
Eosinophils Absolute: 0 10*3/uL (ref 0.0–0.5)
Eosinophils Relative: 0 %
HCT: 37.3 % — ABNORMAL LOW (ref 39.0–52.0)
Hemoglobin: 11.8 g/dL — ABNORMAL LOW (ref 13.0–17.0)
Immature Granulocytes: 2 %
Lymphocytes Relative: 3 %
Lymphs Abs: 0.4 10*3/uL — ABNORMAL LOW (ref 0.7–4.0)
MCH: 29.7 pg (ref 26.0–34.0)
MCHC: 31.6 g/dL (ref 30.0–36.0)
MCV: 94 fL (ref 80.0–100.0)
Monocytes Absolute: 0.4 10*3/uL (ref 0.1–1.0)
Monocytes Relative: 3 %
Neutro Abs: 11.8 10*3/uL — ABNORMAL HIGH (ref 1.7–7.7)
Neutrophils Relative %: 92 %
Platelets: 285 10*3/uL (ref 150–400)
RBC: 3.97 MIL/uL — ABNORMAL LOW (ref 4.22–5.81)
RDW: 15 % (ref 11.5–15.5)
WBC: 12.8 10*3/uL — ABNORMAL HIGH (ref 4.0–10.5)
nRBC: 0 % (ref 0.0–0.2)

## 2020-04-08 LAB — RESPIRATORY PANEL BY RT PCR (FLU A&B, COVID)
Influenza A by PCR: NEGATIVE
Influenza B by PCR: NEGATIVE
SARS Coronavirus 2 by RT PCR: NEGATIVE

## 2020-04-08 LAB — COMPREHENSIVE METABOLIC PANEL
ALT: 17 U/L (ref 0–44)
AST: 15 U/L (ref 15–41)
Albumin: 2.9 g/dL — ABNORMAL LOW (ref 3.5–5.0)
Alkaline Phosphatase: 70 U/L (ref 38–126)
Anion gap: 13 (ref 5–15)
BUN: 54 mg/dL — ABNORMAL HIGH (ref 8–23)
CO2: 10 mmol/L — ABNORMAL LOW (ref 22–32)
Calcium: 7.9 mg/dL — ABNORMAL LOW (ref 8.9–10.3)
Chloride: 111 mmol/L (ref 98–111)
Creatinine, Ser: 2.58 mg/dL — ABNORMAL HIGH (ref 0.61–1.24)
GFR calc Af Amer: 26 mL/min — ABNORMAL LOW (ref >60)
GFR calc non Af Amer: 22 mL/min — ABNORMAL LOW (ref >60)
Glucose, Bld: 200 mg/dL — ABNORMAL HIGH (ref 70–99)
Potassium: 3.6 mmol/L (ref 3.5–5.1)
Sodium: 134 mmol/L — ABNORMAL LOW (ref 135–145)
Total Bilirubin: 0.5 mg/dL (ref 0.3–1.2)
Total Protein: 6.7 g/dL (ref 6.5–8.1)

## 2020-04-08 LAB — URINALYSIS, ROUTINE W REFLEX MICROSCOPIC
Bilirubin Urine: NEGATIVE
Glucose, UA: NEGATIVE mg/dL
Ketones, ur: NEGATIVE mg/dL
Nitrite: NEGATIVE
Protein, ur: 30 mg/dL — AB
Specific Gravity, Urine: 1.01 (ref 1.005–1.030)
WBC, UA: >50 WBC/hpf — ABNORMAL HIGH (ref 0–5)
pH: 6 (ref 5.0–8.0)

## 2020-04-08 LAB — I-STAT VENOUS BLOOD GAS, ED
Acid-base deficit: 15 mmol/L — ABNORMAL HIGH (ref 0.0–2.0)
Bicarbonate: 11.8 mmol/L — ABNORMAL LOW (ref 20.0–28.0)
Calcium, Ion: 1.14 mmol/L — ABNORMAL LOW (ref 1.15–1.40)
HCT: 34 % — ABNORMAL LOW (ref 39.0–52.0)
Hemoglobin: 11.6 g/dL — ABNORMAL LOW (ref 13.0–17.0)
O2 Saturation: 58 %
Potassium: 4 mmol/L (ref 3.5–5.1)
Sodium: 141 mmol/L (ref 135–145)
TCO2: 13 mmol/L — ABNORMAL LOW (ref 22–32)
pCO2, Ven: 30.5 mmHg — ABNORMAL LOW (ref 44.0–60.0)
pH, Ven: 7.196 — CL (ref 7.250–7.430)
pO2, Ven: 36 mmHg (ref 32.0–45.0)

## 2020-04-08 LAB — LACTIC ACID, PLASMA
Lactic Acid, Venous: 2.2 mmol/L (ref 0.5–1.9)
Lactic Acid, Venous: 3 mmol/L (ref 0.5–1.9)

## 2020-04-08 LAB — PROTIME-INR
INR: 1.5 — ABNORMAL HIGH (ref 0.8–1.2)
Prothrombin Time: 17.2 seconds — ABNORMAL HIGH (ref 11.4–15.2)

## 2020-04-08 LAB — CK: Total CK: 143 U/L (ref 49–397)

## 2020-04-08 LAB — APTT: aPTT: 30 seconds (ref 24–36)

## 2020-04-08 MED ORDER — METRONIDAZOLE IN NACL 5-0.79 MG/ML-% IV SOLN
500.0000 mg | Freq: Once | INTRAVENOUS | Status: AC
  Administered 2020-04-08: 500 mg via INTRAVENOUS
  Filled 2020-04-08: qty 100

## 2020-04-08 MED ORDER — LACTATED RINGERS IV SOLN: INTRAVENOUS | Status: AC

## 2020-04-08 MED ORDER — LACTATED RINGERS IV BOLUS (SEPSIS)
1000.0000 mL | Freq: Once | INTRAVENOUS | Status: AC
  Administered 2020-04-08: 1000 mL via INTRAVENOUS

## 2020-04-08 MED ORDER — VANCOMYCIN VARIABLE DOSE PER UNSTABLE RENAL FUNCTION (PHARMACIST DOSING): Status: DC

## 2020-04-08 MED ORDER — VANCOMYCIN HCL 1750 MG/350ML IV SOLN
1750.0000 mg | Freq: Once | INTRAVENOUS | Status: AC
  Administered 2020-04-08: 1750 mg via INTRAVENOUS
  Filled 2020-04-08: qty 350

## 2020-04-08 MED ORDER — VANCOMYCIN HCL IN DEXTROSE 1-5 GM/200ML-% IV SOLN: 1000.0000 mg | Freq: Once | INTRAVENOUS | Status: DC

## 2020-04-08 MED ORDER — SODIUM CHLORIDE 0.9 % IV SOLN
2.0000 g | Freq: Once | INTRAVENOUS | Status: AC
  Administered 2020-04-08: 2 g via INTRAVENOUS
  Filled 2020-04-08: qty 2

## 2020-04-08 MED ORDER — SODIUM CHLORIDE 0.9 % IV SOLN: 2.0000 g | INTRAVENOUS | Status: DC

## 2020-04-08 NOTE — Progress Notes (Signed)
Pharmacy Antibiotic Note  Shane [name] is a 84 y.o. male admitted on 04/08/2020 with sepsis.  Pharmacy has been consulted for Cefepime and Vancomycin dosing.   Height: 5\' 7"  (170.2 cm) Weight: 85 kg (187 lb 6.3 oz) IBW/kg (Calculated) : 66.1  Temp (24hrs), Avg:100.6 F (38.1 C), Min:100.2 F (37.9 C), Max:100.9 F (38.3 C)  Recent Labs  Lab 04/08/20 2120  WBC 12.8*  CREATININE 2.58*    Estimated Creatinine Clearance: 23 mL/min (A) (by C-G formula based on SCr of 2.58 mg/dL (H)).    Allergies  Allergen Reactions  . Other Swelling    Pt states had swelling of the eye when having eye surgery and the anesthetic placed in eye     Antimicrobials this admission: 9/26 Cefepime >>  9/26 Vancomycin >>   Dose adjustments this admission: N/a  Microbiology results: Pending   Plan:  - Cefepime 2g IV q24h - Vancomycin 1750mg  IV x 1 dose  - Will dose Vancomycin based on random levels (36-48hr post depending on renal fxn improvement overnight)  - Monitor patients renal function and urine output  - De-escalate ABX when appropriate   Thank you for allowing pharmacy to be a part of this patient's care.  Duanne Limerick PharmD. BCPS 04/08/2020 10:07 PM

## 2020-04-08 NOTE — ED Triage Notes (Signed)
Patient arrived with EMS from home reports fever today with confusion and fell yesterday with no injury , patient has a urostomy , respirations unlabored. Alert and oriented x4.

## 2020-04-08 NOTE — ED Notes (Signed)
Blood cultures collected , Lactated Ringers IV bolus infusing , IV antibiotics infusing , IV site intact .

## 2020-04-08 NOTE — ED Notes (Signed)
Patient transported to CT scan . 

## 2020-04-08 NOTE — ED Provider Notes (Signed)
[name]   CSN: [id number] Arrival date & time: 04/08/20  2103     History Chief Complaint  Patient presents with  . Fall/Fever/Confused    Shane [name] is a 84 y.o. male.  HPI      84yo male with history of bladder cancer, cerebellar CVA, calcific pancreatitis, CKD, DM, htn, hlpd, CAD, aortic stenosis who presents with concern for fall and fever.   Per wife: For the last few days has been very weak, generalized weakness, fatigue, worse over the last 2 days Sleeping a lot in his chair, fatigue, weakness Low appetite Trying to wake him up earlier today, not responding Daughter came home and not responding Fever 104, before that did not measure fever When EMS arrived was responding better but confused Normally knows when/where orientation COVID Jan, has had pneumonia 3 times since then and acted similarly, glucose increasing when sick also   Yesterday/middle of the night got up to go to the bathroom and couldn't find his balance Martin Majestic out to the kitchen and thought blood sugar might be low Went down to the floor and couldn't get up Was down on the floor a few hours, then someone came and found him Reports he did not hit his head when he fell, denies headache Constipated for a few days Eating and drinking normally    Past Medical History:  Diagnosis Date  . Abnormal EKG 2014   This led to stress test which was abnl, which led to cardiology referral: cath showed small vessel dz of 2nd diag branch with 70-80% stenosis--preserved LV function  . Aortic atherosclerosis ([place])    w/out aneurism  . Bilateral renal cysts    Non-complex (CT 03/2016): no enhancement, coarse calcifications, mass effect does give false appearance of hydro on non-contract series (followed by urol).  . Bladder cancer ([place]) 03/2016   Invasive high grade urothelial carcinoma.  No mets at dx.  Cystoscopy + bx and partial resection of tumor  in hosp when admitted for gross hematuria and AUR.  Neoadj chemo 06/2016, then got cystoprostatectomy. (Urostomy--ileal conduit urinary diversion with refluxing anastamoses--has RLQ urostomy)-gets routine surveillance by urol (Dr. Tresa Moore), most recent 04/2017--no sign of recurrence--obs status  . CAD (coronary artery disease)    small vessel dz x 1 vessel on cath 2014: med mgmt  . Cerebellar cerebrovascular accident (CVA) without late effect 10/2017   Left, punctate: DAPT therapy x 3 wks per Dr. Leonie Man, neuro, then ASA 81mg  alone.  Stable as of 11/2017 f/u with neuro, Dr. Erlinda Hong.  . Chronic calcific pancreatitis ([place]) 03/2016   +stable on f/u imaging 11/2018, + 2 small pseudocysts  . Chronic renal insufficiency, stage 2 (mild) 2019   Borderline II/III  . Diabetes mellitus without complication ([place])    Insulin dependent; DKA admission 10/2017.  . GI bleed 12/2017   Presented with melena and Hb around 5.  Transfused 5 U pRBCs, started to get hematochezia in hosp: no clear explanation for GI bleeding was found on EGD or colonoscopy.  CT abd w/out acute abnormality.  Upper GI w/SBFT showed subtle duodenal erosion.  [name] Heart murmur, systolic    Noted in prior PCP's records.  I recommended echocardiogram 07/2016 but pt declined.  [name] History of Bell's palsy   . History of chemotherapy   . Hyperlipidemia   . Hypertension   . IDA (iron deficiency anemia) 12/2017   GI bleed  . Moderate aortic stenosis    Echo 03/2019.  Pt asymptomatic.  Rpt 1 yr.   . Pancreas cyst 03/2016   CT abd/pelv 12/2017 for GI bleed w/u showed 1.6 cm pancreatic head cyst-->stable and likely benign.  Abd MRI 11/2018->stable pancreatic pseudocysts + chronic pancreatitis changes. Rpt MRI abd w &w/out contrast 2 yrs (approx 11/2020).  [name] Umbilical hernia    Repaired when he got his bladder surgery.    Patient Active Problem List   Diagnosis Date Noted  . Melena   . Duodenal ulcer   . GI bleed 12/17/2017  . Acute blood loss anemia 12/17/2017   . Cerebral thrombosis with cerebral infarction 10/15/2017  . Malnutrition of moderate degree 10/13/2017  . DKA (diabetic ketoacidoses) ([place]) 10/12/2017  . Port catheter in place 02/25/2017  . S/P ileal conduit ([place]) 10/17/2016  . Malignant neoplasm of urinary bladder ([place]) 05/01/2016  . Diabetes mellitus without complication ([place]) 93/79/0240  . UTI (urinary tract infection) 04/01/2016  . Hypertension   . Hematoma of bladder wall   . Hematuria 03/31/2016    Past Surgical History:  Procedure Laterality Date  . BIOPSY  12/18/2017   Procedure: BIOPSY;  Surgeon: Doran Stabler, MD;  Location: [facility];  Service: Gastroenterology;;  . CARDIAC CATHETERIZATION  02/09/2013   small vessel dz of 2nd diag branch with 70-80% stenosis--preserved LV function. Medical therapy rec'd. Agustin Cree, PA)  . CARDIOVASCULAR STRESS TEST  01/26/2013   No ischemia.  Wall motion abnormalities at inferior base suggesting small area of infarction.  EF 64%.  . CAROTID ARTERY DOPPLERS  10/15/2017   1-39% stenosis R ICA, no stenosis L ICA.  [name] CATARACT EXTRACTION    . COLONOSCOPY N/A 12/19/2017   Clotted blood in cecum w/out any underlying abnormality found -->entired colon and terminal ilium normal.  Procedure: COLONOSCOPY;  Surgeon: Doran Stabler, MD;  Location: [facility];  Service: Gastroenterology;  Laterality: N/A;  . cystoprostatectomy w/LN surgery  10/17/2016   Ileal conduit: this is an incontinent urine diversion that directs urine from the ureters through a segment of isolated bowel to the surface of the abdominal wall via a cutaneous stoma, and urine drains continuously into an external ostomy appliance.  . CYSTOSCOPY WITH INJECTION N/A 10/17/2016   Procedure: CYSTOSCOPY WITH INJECTION OF INDOCYANINE GREEN DYE;  Surgeon: Alexis Frock, MD;  Location: [facility];  Service: Urology;  Laterality: N/A;  . ESOPHAGOGASTRODUODENOSCOPY N/A 12/18/2017   Mild chronic gastritis, no metaplasia or dysplasia.  H  pylori pending as of 12/21/17.  nonbleeding duodenal ulcers.  Procedure: ESOPHAGOGASTRODUODENOSCOPY (EGD);  Surgeon: Doran Stabler, MD;  Location: [facility];  Service: Gastroenterology;  Laterality: N/A;  . EYE SURGERY     cataract surgery bilat   . IR GENERIC HISTORICAL  05/12/2016   IR US GUIDE VASC ACCESS RIGHT 05/12/2016 Greggory Keen, MD [facility]  . IR GENERIC HISTORICAL  05/12/2016   IR FLUORO GUIDE PORT INSERTION RIGHT 05/12/2016 Greggory Keen, MD [facility]  . IR REMOVAL TUN ACCESS W/ PORT W/O FL MOD SED  07/03/2017  . OSTOMY     urostomy  . TONSILLECTOMY AND ADENOIDECTOMY  Remote  . TRANSTHORACIC ECHOCARDIOGRAM  10/16/2017; 03/2019   10/2017 EF 65-70%, normal wall motion, grd I DD.  03/2019 same but also new moderate aortic stenosis. Rpt echo 1 yr.  . TRANSURETHRAL RESECTION OF PROSTATE N/A 04/02/2016   Procedure: CYSTOSCOPY, TRANSURETHRAL RESECTION OF BLADDER TUMOR;  Surgeon: [name], MD;  Location: [facility];  Service: Urology;  Laterality: N/A;  . UMBILICAL HERNIA  REPAIR  10/2016  . VASECTOMY         Family History  Problem Relation Age of Onset  . Diabetes Mellitus I Mother   . Diabetes Mellitus I Sister   . Stroke Paternal Grandfather     Social History   Tobacco Use  . Smoking status: Former Smoker    Years: 3.00  . Smokeless tobacco: Never Used  Vaping Use  . Vaping Use: Never used  Substance Use Topics  . Alcohol use: No  . Drug use: No    Home Medications Prior to Admission medications   Medication Sig Start Date End Date Taking? Authorizing Provider  albuterol (VENTOLIN HFA) 108 (90 Base) MCG/ACT inhaler Inhale 2 puffs into the lungs every 6 (six) hours as needed. Patient taking differently: Inhale 2 puffs into the lungs every 6 (six) hours as needed for wheezing or shortness of breath.  07/22/19  Yes Saguier, Percell Miller, PA-C  cetirizine (ZYRTEC) 10 MG tablet Take 1 tablet (10 mg total) by mouth daily. Patient taking differently: Take 10  mg by mouth daily as needed for allergies or rhinitis.  02/18/19  Yes McGowen, [name], MD  Continuous Blood Gluc Sensor (FREESTYLE LIBRE 14 DAY SENSOR) MISC USE AS DIRECTED Patient taking differently: Inject 1 patch into the skin every 14 (fourteen) days.  04/05/19  Yes McGowen, [name], MD  Ferrous Sulfate 27 MG TABS Take 27 mg by mouth daily with breakfast.   Yes [provider]  Omega-3 Fatty Acids (FISH OIL PO) Take 1 capsule by mouth daily.    Yes [provider]  pravastatin (PRAVACHOL) 20 MG tablet TAKE 1 TABLET BY MOUTH EVERY DAY Patient taking differently: Take 20 mg by mouth daily.  10/31/19  Yes McGowen, [name], MD  aspirin EC 81 MG tablet Take 1 tablet (81 mg total) by mouth daily. Patient not taking: Reported on 04/08/2020 05/17/19   Tammi Sou, MD  azithromycin (ZITHROMAX) 250 MG tablet Take 2 tablets by mouth on day 1, followed by 1 tablet by mouth daily for 4 days. Patient not taking: Reported on 04/08/2020 07/22/19   Saguier, Percell Miller, PA-C  benzonatate (TESSALON) 100 MG capsule Take 1 capsule (100 mg total) by mouth 3 (three) times daily as needed for cough. Patient not taking: Reported on 04/08/2020 07/22/19   Saguier, Percell Miller, PA-C  FERROUS SULFATE PO Take 27 mg by mouth. Takes 1 daily Patient not taking: Reported on 04/08/2020    [provider]  insulin aspart (NOVOLOG FLEXPEN) 100 UNIT/ML FlexPen CBG < 70: implement hypoglycemia protocol CBG 70 - 120: 3 units CBG 121 - 150: 5 units CBG 151 - 200: 6 units CBG 201 - 250: 8 units CBG 251 - 300: 11 units CBG 301 - 350: 14 units CBG 351 - 400: 18 units CBG > 400: call MD 02/04/18   Tammi Sou, MD  Insulin Glargine (LANTUS SOLOSTAR) 100 UNIT/ML Solostar Pen INJECT 5 UNITS SUBQ AT BEDTIME Patient taking differently: Inject 5 Units into the skin at bedtime. INJECT 5 UNITS SUBQ AT BEDTIME 04/07/19   McGowen, [name], MD  Insulin Pen Needle (PEN NEEDLES) 32G X 4 MM MISC 1 each by Does not apply  route daily. 10/07/17   McGowen, [name], MD  Insulin Pen Needle 31G X 5 MM MISC Inject insulin via insulin pen 3 x daily with meals 11/17/17   McGowen, [name], MD  Lancets 30G MISC Use to check blood sugar 4 times daily 01/21/18  McGowen, [name], MD  lisinopril (ZESTRIL) 20 MG tablet Take 1 tablet (20 mg total) by mouth 2 (two) times daily. Patient not taking: Reported on 04/08/2020 05/17/19   McGowen, [name], MD    Allergies    Other  Review of Systems   Review of Systems  Constitutional: Positive for fever.  Respiratory: Positive for cough (wife reports he had episode of coughing this afternoon).   Cardiovascular: Negative for chest pain.  Gastrointestinal: Negative for abdominal pain, diarrhea, nausea and vomiting.  Genitourinary: Difficulty urinating: urostomy.  Neurological: Negative for headaches.    Physical Exam Updated Vital Signs BP 114/65   Pulse 87   Temp 98.4 F (36.9 C) (Temporal)   Resp 20   Ht 5\' 7"  (1.702 m)   Wt 85 kg   SpO2 99%   BMI 29.35 kg/m   Physical Exam Vitals and nursing note reviewed.  Constitutional:      General: He is in acute distress.     Appearance: He is well-developed and underweight. He is ill-appearing and toxic-appearing. He is not diaphoretic.     Comments: Sleepy  HENT:     Head: Normocephalic and atraumatic.  Eyes:     Conjunctiva/sclera: Conjunctivae normal.  Cardiovascular:     Rate and Rhythm: Normal rate and regular rhythm.     Heart sounds: Normal heart sounds. No murmur heard.  No friction rub. No gallop.   Pulmonary:     Effort: Pulmonary effort is normal. No respiratory distress.     Breath sounds: Normal breath sounds. No wheezing or rales.  Abdominal:     General: There is no distension.     Palpations: Abdomen is soft.     Tenderness: There is no abdominal tenderness. There is no guarding.  Musculoskeletal:     Cervical back: Normal range of motion.  Skin:    General: Skin is warm and dry.  Neurological:      Mental Status: He is oriented to person, place, and time.     Comments: States name, location Reports it is September 2018      ED Results / Procedures / Treatments   Labs (all labs ordered are listed, but only abnormal results are displayed) Labs Reviewed  CBC WITH DIFFERENTIAL/PLATELET - Abnormal; Notable for the following components:      Result Value   WBC 12.8 (*)    RBC 3.97 (*)    Hemoglobin 11.8 (*)    HCT 37.3 (*)    Neutro Abs 11.8 (*)    Lymphs Abs 0.4 (*)    Abs Immature Granulocytes 0.21 (*)    All other components within normal limits  COMPREHENSIVE METABOLIC PANEL - Abnormal; Notable for the following components:   Sodium 134 (*)    CO2 10 (*)    Glucose, Bld 200 (*)    BUN 54 (*)    Creatinine, Ser 2.58 (*)    Calcium 7.9 (*)    Albumin 2.9 (*)    GFR calc non Af Amer 22 (*)    GFR calc Af Amer 26 (*)    All other components within normal limits  URINALYSIS, ROUTINE W REFLEX MICROSCOPIC - Abnormal; Notable for the following components:   APPearance CLOUDY (*)    Hgb urine dipstick MODERATE (*)    Protein, ur 30 (*)    Leukocytes,Ua LARGE (*)    WBC, UA >50 (*)    Bacteria, UA MANY (*)    All other components within normal limits  LACTIC ACID, PLASMA - Abnormal; Notable for the following components:   Lactic Acid, Venous 2.2 (*)    All other components within normal limits  LACTIC ACID, PLASMA - Abnormal; Notable for the following components:   Lactic Acid, Venous 3.0 (*)    All other components within normal limits  PROTIME-INR - Abnormal; Notable for the following components:   Prothrombin Time 17.2 (*)    INR 1.5 (*)    All other components within normal limits  I-STAT VENOUS BLOOD GAS, ED - Abnormal; Notable for the following components:   pH, Ven 7.196 (*)    pCO2, Ven 30.5 (*)    Bicarbonate 11.8 (*)    TCO2 13 (*)    Acid-base deficit 15.0 (*)    Calcium, Ion 1.14 (*)    HCT 34.0 (*)    Hemoglobin 11.6 (*)    All other components  within normal limits  RESPIRATORY PANEL BY RT PCR (FLU A&B, COVID)  CULTURE, BLOOD (ROUTINE X 2)  CULTURE, BLOOD (ROUTINE X 2)  URINE CULTURE  CK  APTT    EKG EKG Interpretation  Date/Time:  Sunday April 08 2020 21:04:18 EDT Ventricular Rate:  94 PR Interval:    QRS Duration: 104 QT Interval:  403 QTC Calculation: 504 R Axis:   -47 Text Interpretation: Sinus rhythm LAD, consider left anterior fascicular block Borderline T wave abnormalities Prolonged QT interval Since prior ECG, TW inversiosn laterally no longer present Confirmed by Gareth Morgan [phone]) on 04/08/2020 9:18:04 PM   Radiology CT Head Wo Contrast  Result Date: 04/08/2020 CLINICAL DATA:  Fall yesterday.  Fever and confusion today. EXAM: CT HEAD WITHOUT CONTRAST TECHNIQUE: Contiguous axial images were obtained from the base of the skull through the vertex without intravenous contrast. COMPARISON:  CT head 10/12/2017.  MRI brain 10/14/2017 FINDINGS: Brain: No evidence of acute infarction, hemorrhage, hydrocephalus, extra-axial collection or mass lesion/mass effect. Diffuse cerebral atrophy. Mild ventricular dilatation consistent with central atrophy. Low-attenuation changes in the deep white matter consistent small vessel ischemia. Parenchymal calcifications in the basal ganglia and cerebellum. Vascular: Intracranial arterial vascular calcifications are present. Skull: Depression and deformity of the outer table of the right frontal sinus, new since previous study. This is consistent with a depression fracture. No significant overlying soft tissue swelling suggests this to represent an old rather than acute fracture. Calvarium appears otherwise intact. Sinuses/Orbits: Paranasal sinuses and mastoid air cells are clear. Other: None. IMPRESSION: 1. No acute intracranial abnormalities. 2. Chronic atrophy and small vessel ischemic changes. 3. Depressed fracture deformity of the outer table of the right frontal sinus. No  significant overlying soft tissue swelling and lack of fluid in the sinus suggests this to represent an old rather than acute fracture. Electronically Signed   By: Lucienne Capers M.D.   On: 04/08/2020 23:33   DG Chest Port 1 View  Result Date: 04/08/2020 CLINICAL DATA:  Question sepsis.  Fever today, confusion. EXAM: PORTABLE CHEST 1 VIEW COMPARISON:  10/12/2017 FINDINGS: The cardiomediastinal contours are normal. Normal heart size. Aortic atherosclerosis. Subsegmental opacities at the left lung base. Pulmonary vasculature is normal. No pleural effusion or pneumothorax. No acute osseous abnormalities are seen. The bones are under mineralized. There is likely interposition of colon under the right hemidiaphragm. IMPRESSION: Subsegmental opacities at the left lung base, favor atelectasis or scarring. Airspace disease such as pneumonia is also considered in the setting of fever. Electronically Signed   By: Keith Rake M.D.   On: 04/08/2020 21:53  Procedures Procedures (including critical care time)  Medications Ordered in ED Medications  lactated ringers infusion ( Intravenous New Bag/Given 04/08/20 2137)  ceFEPIme (MAXIPIME) 2 g in sodium chloride 0.9 % 100 mL IVPB (has no administration in time range)  vancomycin variable dose per unstable renal function (pharmacist dosing) (has no administration in time range)  lactated ringers bolus 1,000 mL (0 mLs Intravenous Stopped 04/08/20 2208)    And  lactated ringers bolus 1,000 mL (0 mLs Intravenous Stopped 04/08/20 2226)    And  lactated ringers bolus 1,000 mL (0 mLs Intravenous Stopped 04/08/20 2254)  ceFEPIme (MAXIPIME) 2 g in sodium chloride 0.9 % 100 mL IVPB (0 g Intravenous Stopped 04/08/20 2213)  metroNIDAZOLE (FLAGYL) IVPB 500 mg (0 mg Intravenous Stopped 04/08/20 2231)  vancomycin (VANCOREADY) IVPB 1750 mg/350 mL (0 mg Intravenous Stopped 04/08/20 2351)    ED Course  I have reviewed the triage vital signs and the nursing  notes.  Pertinent labs & imaging results that were available during my care of the patient were reviewed by me and considered in my medical decision making (see chart for details).    MDM Rules/Calculators/A&P                          84yo male with history of bladder cancer, cerebellar CVA, calcific pancreatitis, CKD, DM, htn, hlpd, CAD, aortic stenosis who presents with concern for fall and fever. Code sepsis initiated with fever and blood pressure 95/65 on arrival to the emergency department. Given unclear source of infection on arrival, he was given broad-spectrum antibiotics. Urinalysis from urostomy looks concerning for possible infection, and chest x-ray also shows atelectasis versus pneumonia. Wife does report he had some coughing today, it is possible either pneumonia or urinary source because of his sepsis.  Doubt meningitis given hx, exam.   He was given 30 cc/kg of lactated Ringer's. Blood pressures improved from a low of 84/40 1-1 14/65. Labs are significant for mild lactic acidosis, acute kidney injury, and severe non-anion gap metabolic acidosis. He denies diarrhea, is possible that this may be secondary to his urostomy. Given mild acidosis on VBG with a CAD less than 2 times normal, would not give bicarb at this time. CT head was done given fall last night and shows no evidence of intracranial injury.  Hospitalist to admit for further care and wife updated at bedside.       Final Clinical Impression(s) / ED Diagnoses Final diagnoses:  Acute kidney injury ([place])  Metabolic acidosis  Sepsis, due to unspecified organism, unspecified whether acute organ dysfunction present [facility])  History of urostomy  Urinary tract infection without hematuria, site unspecified  Fall, initial encounter  Fever, unspecified fever cause  Lactic acidosis    Rx / DC Orders ED Discharge Orders    None       Gareth Morgan, MD 04/08/20 2359

## 2020-04-09 DIAGNOSIS — Z8673 Personal history of transient ischemic attack (TIA), and cerebral infarction without residual deficits: Secondary | ICD-10-CM | POA: Diagnosis not present

## 2020-04-09 DIAGNOSIS — N2889 Other specified disorders of kidney and ureter: Secondary | ICD-10-CM | POA: Diagnosis not present

## 2020-04-09 DIAGNOSIS — A4152 Sepsis due to Pseudomonas: Secondary | ICD-10-CM | POA: Diagnosis not present

## 2020-04-09 DIAGNOSIS — Z20822 Contact with and (suspected) exposure to covid-19: Secondary | ICD-10-CM | POA: Diagnosis present

## 2020-04-09 DIAGNOSIS — N189 Chronic kidney disease, unspecified: Secondary | ICD-10-CM | POA: Diagnosis not present

## 2020-04-09 DIAGNOSIS — N170 Acute kidney failure with tubular necrosis: Secondary | ICD-10-CM | POA: Diagnosis present

## 2020-04-09 DIAGNOSIS — Z87891 Personal history of nicotine dependence: Secondary | ICD-10-CM | POA: Diagnosis not present

## 2020-04-09 DIAGNOSIS — N1831 Chronic kidney disease, stage 3a: Secondary | ICD-10-CM | POA: Diagnosis present

## 2020-04-09 DIAGNOSIS — A419 Sepsis, unspecified organism: Secondary | ICD-10-CM | POA: Diagnosis not present

## 2020-04-09 DIAGNOSIS — R6520 Severe sepsis without septic shock: Secondary | ICD-10-CM | POA: Diagnosis present

## 2020-04-09 DIAGNOSIS — N1330 Unspecified hydronephrosis: Secondary | ICD-10-CM | POA: Diagnosis not present

## 2020-04-09 DIAGNOSIS — Z936 Other artificial openings of urinary tract status: Secondary | ICD-10-CM | POA: Diagnosis not present

## 2020-04-09 DIAGNOSIS — N200 Calculus of kidney: Secondary | ICD-10-CM | POA: Diagnosis not present

## 2020-04-09 DIAGNOSIS — Z8551 Personal history of malignant neoplasm of bladder: Secondary | ICD-10-CM | POA: Diagnosis not present

## 2020-04-09 DIAGNOSIS — I083 Combined rheumatic disorders of mitral, aortic and tricuspid valves: Secondary | ICD-10-CM | POA: Diagnosis present

## 2020-04-09 DIAGNOSIS — N132 Hydronephrosis with renal and ureteral calculous obstruction: Secondary | ICD-10-CM | POA: Diagnosis not present

## 2020-04-09 DIAGNOSIS — E874 Mixed disorder of acid-base balance: Secondary | ICD-10-CM | POA: Diagnosis present

## 2020-04-09 DIAGNOSIS — Z452 Encounter for adjustment and management of vascular access device: Secondary | ICD-10-CM | POA: Diagnosis not present

## 2020-04-09 DIAGNOSIS — N136 Pyonephrosis: Secondary | ICD-10-CM | POA: Diagnosis present

## 2020-04-09 DIAGNOSIS — J181 Lobar pneumonia, unspecified organism: Secondary | ICD-10-CM | POA: Diagnosis present

## 2020-04-09 DIAGNOSIS — I700 Atherosclerosis of aorta: Secondary | ICD-10-CM | POA: Diagnosis present

## 2020-04-09 DIAGNOSIS — A4181 Sepsis due to Enterococcus: Secondary | ICD-10-CM | POA: Diagnosis present

## 2020-04-09 DIAGNOSIS — K861 Other chronic pancreatitis: Secondary | ICD-10-CM | POA: Diagnosis present

## 2020-04-09 DIAGNOSIS — I2510 Atherosclerotic heart disease of native coronary artery without angina pectoris: Secondary | ICD-10-CM | POA: Diagnosis present

## 2020-04-09 DIAGNOSIS — E860 Dehydration: Secondary | ICD-10-CM | POA: Diagnosis not present

## 2020-04-09 DIAGNOSIS — E1122 Type 2 diabetes mellitus with diabetic chronic kidney disease: Secondary | ICD-10-CM | POA: Diagnosis present

## 2020-04-09 DIAGNOSIS — E872 Acidosis: Secondary | ICD-10-CM | POA: Diagnosis not present

## 2020-04-09 DIAGNOSIS — I129 Hypertensive chronic kidney disease with stage 1 through stage 4 chronic kidney disease, or unspecified chronic kidney disease: Secondary | ICD-10-CM | POA: Diagnosis present

## 2020-04-09 DIAGNOSIS — Z823 Family history of stroke: Secondary | ICD-10-CM | POA: Diagnosis not present

## 2020-04-09 DIAGNOSIS — N179 Acute kidney failure, unspecified: Secondary | ICD-10-CM | POA: Diagnosis present

## 2020-04-09 DIAGNOSIS — E876 Hypokalemia: Secondary | ICD-10-CM | POA: Diagnosis present

## 2020-04-09 DIAGNOSIS — Z833 Family history of diabetes mellitus: Secondary | ICD-10-CM | POA: Diagnosis not present

## 2020-04-09 DIAGNOSIS — E785 Hyperlipidemia, unspecified: Secondary | ICD-10-CM | POA: Diagnosis present

## 2020-04-09 LAB — COMPREHENSIVE METABOLIC PANEL
ALT: 16 U/L (ref 0–44)
AST: 16 U/L (ref 15–41)
Albumin: 2.4 g/dL — ABNORMAL LOW (ref 3.5–5.0)
Alkaline Phosphatase: 62 U/L (ref 38–126)
Anion gap: 9 (ref 5–15)
BUN: 50 mg/dL — ABNORMAL HIGH (ref 8–23)
CO2: 11 mmol/L — ABNORMAL LOW (ref 22–32)
Calcium: 7.8 mg/dL — ABNORMAL LOW (ref 8.9–10.3)
Chloride: 115 mmol/L — ABNORMAL HIGH (ref 98–111)
Creatinine, Ser: 2.52 mg/dL — ABNORMAL HIGH (ref 0.61–1.24)
GFR calc Af Amer: 26 mL/min — ABNORMAL LOW (ref >60)
GFR calc non Af Amer: 23 mL/min — ABNORMAL LOW (ref >60)
Glucose, Bld: 179 mg/dL — ABNORMAL HIGH (ref 70–99)
Potassium: 3.3 mmol/L — ABNORMAL LOW (ref 3.5–5.1)
Sodium: 135 mmol/L (ref 135–145)
Total Bilirubin: 0.5 mg/dL (ref 0.3–1.2)
Total Protein: 5.7 g/dL — ABNORMAL LOW (ref 6.5–8.1)

## 2020-04-09 LAB — BLOOD CULTURE ID PANEL (REFLEXED) - BCID2
A.calcoaceticus-baumannii: NOT DETECTED
Bacteroides fragilis: NOT DETECTED
CTX-M ESBL: NOT DETECTED
Candida albicans: NOT DETECTED
Candida auris: NOT DETECTED
Candida glabrata: NOT DETECTED
Candida krusei: NOT DETECTED
Candida parapsilosis: NOT DETECTED
Candida tropicalis: NOT DETECTED
Carbapenem resistance IMP: NOT DETECTED
Carbapenem resistance KPC: NOT DETECTED
Carbapenem resistance NDM: NOT DETECTED
Carbapenem resistance VIM: NOT DETECTED
Cryptococcus neoformans/gattii: NOT DETECTED
Enterobacter cloacae complex: NOT DETECTED
Enterobacterales: NOT DETECTED
Enterococcus Faecium: NOT DETECTED
Enterococcus faecalis: DETECTED — AB
Escherichia coli: NOT DETECTED
Haemophilus influenzae: NOT DETECTED
Klebsiella aerogenes: NOT DETECTED
Klebsiella oxytoca: NOT DETECTED
Klebsiella pneumoniae: NOT DETECTED
Listeria monocytogenes: NOT DETECTED
Neisseria meningitidis: NOT DETECTED
Proteus species: NOT DETECTED
Pseudomonas aeruginosa: DETECTED — AB
Salmonella species: NOT DETECTED
Serratia marcescens: NOT DETECTED
Staphylococcus aureus (BCID): NOT DETECTED
Staphylococcus epidermidis: NOT DETECTED
Staphylococcus lugdunensis: NOT DETECTED
Staphylococcus species: NOT DETECTED
Stenotrophomonas maltophilia: NOT DETECTED
Streptococcus agalactiae: NOT DETECTED
Streptococcus pneumoniae: NOT DETECTED
Streptococcus pyogenes: NOT DETECTED
Streptococcus species: NOT DETECTED
Vancomycin resistance: NOT DETECTED

## 2020-04-09 LAB — CBG MONITORING, ED
Glucose-Capillary: 155 mg/dL — ABNORMAL HIGH (ref 70–99)
Glucose-Capillary: 169 mg/dL — ABNORMAL HIGH (ref 70–99)
Glucose-Capillary: 156 mg/dL — ABNORMAL HIGH (ref 70–99)
Glucose-Capillary: 135 mg/dL — ABNORMAL HIGH (ref 70–99)

## 2020-04-09 LAB — PROCALCITONIN: Procalcitonin: 9.21 ng/mL

## 2020-04-09 LAB — LACTIC ACID, PLASMA
Lactic Acid, Venous: 1 mmol/L (ref 0.5–1.9)
Lactic Acid, Venous: 1.6 mmol/L (ref 0.5–1.9)

## 2020-04-09 LAB — PROTIME-INR
INR: 1.5 — ABNORMAL HIGH (ref 0.8–1.2)
Prothrombin Time: 17.2 seconds — ABNORMAL HIGH (ref 11.4–15.2)

## 2020-04-09 LAB — CBC
HCT: 33 % — ABNORMAL LOW (ref 39.0–52.0)
Hemoglobin: 10.6 g/dL — ABNORMAL LOW (ref 13.0–17.0)
MCH: 30.5 pg (ref 26.0–34.0)
MCHC: 32.1 g/dL (ref 30.0–36.0)
MCV: 95.1 fL (ref 80.0–100.0)
Platelets: 260 10*3/uL (ref 150–400)
RBC: 3.47 MIL/uL — ABNORMAL LOW (ref 4.22–5.81)
RDW: 15.2 % (ref 11.5–15.5)
WBC: 13.3 10*3/uL — ABNORMAL HIGH (ref 4.0–10.5)
nRBC: 0 % (ref 0.0–0.2)

## 2020-04-09 LAB — HEMOGLOBIN A1C
Hgb A1c MFr Bld: 6.3 % — ABNORMAL HIGH (ref 4.8–5.6)
Mean Plasma Glucose: 134.11 mg/dL

## 2020-04-09 LAB — CORTISOL-AM, BLOOD: Cortisol - AM: 22.4 ug/dL (ref 6.7–22.6)

## 2020-04-09 MED ORDER — OMEGA-3-ACID ETHYL ESTERS 1 G PO CAPS
1.0000 g | ORAL_CAPSULE | Freq: Two times a day (BID) | ORAL | Status: DC
  Administered 2020-04-09 – 2020-04-17 (×14): 1 g via ORAL
  Filled 2020-04-09 (×18): qty 1

## 2020-04-09 MED ORDER — ONDANSETRON HCL 4 MG PO TABS: 4.0000 mg | ORAL_TABLET | Freq: Four times a day (QID) | ORAL | Status: DC | PRN

## 2020-04-09 MED ORDER — SODIUM CHLORIDE 0.9 % IV SOLN
500.0000 mg | INTRAVENOUS | Status: DC
  Administered 2020-04-09: 500 mg via INTRAVENOUS
  Filled 2020-04-09: qty 500

## 2020-04-09 MED ORDER — INSULIN GLARGINE 100 UNIT/ML ~~LOC~~ SOLN
5.0000 [IU] | Freq: Every day | SUBCUTANEOUS | Status: DC
  Administered 2020-04-09 – 2020-04-16 (×8): 5 [IU] via SUBCUTANEOUS
  Filled 2020-04-09 (×11): qty 0.05

## 2020-04-09 MED ORDER — ACETAMINOPHEN 325 MG PO TABS
650.0000 mg | ORAL_TABLET | Freq: Four times a day (QID) | ORAL | Status: DC | PRN
  Administered 2020-04-13: 650 mg via ORAL
  Filled 2020-04-09: qty 2

## 2020-04-09 MED ORDER — INSULIN GLARGINE 100 UNIT/ML SOLOSTAR PEN: 5.0000 [IU] | PEN_INJECTOR | Freq: Every day | SUBCUTANEOUS | Status: DC

## 2020-04-09 MED ORDER — SODIUM CHLORIDE 0.9 % IV SOLN
2.0000 g | INTRAVENOUS | Status: DC
  Administered 2020-04-09: 2 g via INTRAVENOUS
  Filled 2020-04-09: qty 20

## 2020-04-09 MED ORDER — ACETAMINOPHEN 650 MG RE SUPP: 650.0000 mg | Freq: Four times a day (QID) | RECTAL | Status: DC | PRN

## 2020-04-09 MED ORDER — SODIUM CHLORIDE 0.9 % IV SOLN
2.0000 g | INTRAVENOUS | Status: DC
  Administered 2020-04-10: 2 g via INTRAVENOUS
  Filled 2020-04-09 (×2): qty 2

## 2020-04-09 MED ORDER — INSULIN ASPART 100 UNIT/ML ~~LOC~~ SOLN: 0.0000 [IU] | Freq: Every day | SUBCUTANEOUS | Status: DC

## 2020-04-09 MED ORDER — FERROUS SULFATE 325 (65 FE) MG PO TABS
325.0000 mg | ORAL_TABLET | Freq: Every day | ORAL | Status: DC
  Administered 2020-04-09 – 2020-04-17 (×8): 325 mg via ORAL
  Filled 2020-04-09 (×10): qty 1

## 2020-04-09 MED ORDER — ALBUTEROL SULFATE HFA 108 (90 BASE) MCG/ACT IN AERS
2.0000 | INHALATION_SPRAY | Freq: Four times a day (QID) | RESPIRATORY_TRACT | Status: DC | PRN
  Filled 2020-04-09: qty 6.7

## 2020-04-09 MED ORDER — PRAVASTATIN SODIUM 10 MG PO TABS
20.0000 mg | ORAL_TABLET | Freq: Every day | ORAL | Status: DC
  Administered 2020-04-09 – 2020-04-17 (×8): 20 mg via ORAL
  Filled 2020-04-09 (×9): qty 2

## 2020-04-09 MED ORDER — LORATADINE 10 MG PO TABS
10.0000 mg | ORAL_TABLET | Freq: Every day | ORAL | Status: DC
  Administered 2020-04-09 – 2020-04-17 (×8): 10 mg via ORAL
  Filled 2020-04-09 (×9): qty 1

## 2020-04-09 MED ORDER — ONDANSETRON HCL 4 MG/2ML IJ SOLN: 4.0000 mg | Freq: Four times a day (QID) | INTRAMUSCULAR | Status: DC | PRN

## 2020-04-09 MED ORDER — SODIUM CHLORIDE 0.9 % IV SOLN
2.0000 g | Freq: Once | INTRAVENOUS | Status: AC
  Administered 2020-04-09: 2 g via INTRAVENOUS
  Filled 2020-04-09: qty 2

## 2020-04-09 MED ORDER — SODIUM CHLORIDE 0.9 % IV SOLN
2.0000 g | Freq: Three times a day (TID) | INTRAVENOUS | Status: DC
  Administered 2020-04-09 – 2020-04-11 (×6): 2 g via INTRAVENOUS
  Filled 2020-04-09 (×4): qty 2000
  Filled 2020-04-09 (×4): qty 2
  Filled 2020-04-09: qty 2000

## 2020-04-09 MED ORDER — HEPARIN SODIUM (PORCINE) 5000 UNIT/ML IJ SOLN
5000.0000 [IU] | Freq: Three times a day (TID) | INTRAMUSCULAR | Status: DC
  Administered 2020-04-09 – 2020-04-17 (×24): 5000 [IU] via SUBCUTANEOUS
  Filled 2020-04-09 (×24): qty 1

## 2020-04-09 MED ORDER — SODIUM BICARBONATE 8.4 % IV SOLN
INTRAVENOUS | Status: DC
  Filled 2020-04-09 (×2): qty 850
  Filled 2020-04-09: qty 150
  Filled 2020-04-09 (×2): qty 850

## 2020-04-09 MED ORDER — INSULIN ASPART 100 UNIT/ML ~~LOC~~ SOLN
0.0000 [IU] | Freq: Three times a day (TID) | SUBCUTANEOUS | Status: DC
  Administered 2020-04-09 (×3): 2 [IU] via SUBCUTANEOUS
  Administered 2020-04-10: 1 [IU] via SUBCUTANEOUS
  Administered 2020-04-10: 3 [IU] via SUBCUTANEOUS
  Administered 2020-04-10: 2 [IU] via SUBCUTANEOUS
  Administered 2020-04-11: 1 [IU] via SUBCUTANEOUS
  Administered 2020-04-11: 2 [IU] via SUBCUTANEOUS
  Administered 2020-04-11 – 2020-04-16 (×10): 1 [IU] via SUBCUTANEOUS
  Administered 2020-04-17: 2 [IU] via SUBCUTANEOUS

## 2020-04-09 NOTE — Progress Notes (Signed)
Pharmacy Antibiotic Note  Shane [name] is a 84 y.o. male admitted on 04/08/2020 with fever and weakness.  Pharmacy has been consulted for cefepime and ampicillin dosing.  Tmax 100.9, wbc 13. Blood cultures now growing pseudomonas and enterococcus faecalis. Will adjust antibiotics per BCID protocol. Noted renal impairment will renally dose adjust antibiotics.   Plan: Ampicillin 2g q8 hours Cefepime 2g q24 hours Follow up length of therapy  Height: 5\' 7"  (170.2 cm) Weight: 85 kg (187 lb 6.3 oz) IBW/kg (Calculated) : 66.1  Temp (24hrs), Avg:99.3 F (37.4 C), Min:97.7 F (36.5 C), Max:100.9 F (38.3 C)  Recent Labs  Lab 04/08/20 2120 04/08/20 2250 04/09/20 0411 04/09/20 0725 04/09/20 0943  WBC 12.8*  --   --  13.3*  --   CREATININE 2.58*  --   --  2.52*  --   LATICACIDVEN 2.2* 3.0* 1.0  --  1.6    Estimated Creatinine Clearance: 23.6 mL/min (A) (by C-G formula based on SCr of 2.52 mg/dL (H)).    Allergies  Allergen Reactions  . Other Swelling and Other (See Comments)    Pt states had swelling of the eye when having eye surgery and the anesthetic placed in eye- recovered the same day    Thank you for allowing pharmacy to be a part of this patient's care.  Erin Hearing PharmD., BCPS Clinical Pharmacist 04/09/2020 6:09 PM

## 2020-04-09 NOTE — H&P (Signed)
History and Physical   Shane [name] OFB:[id number] DOB: [birth year] DOA: 04/08/2020  Referring MD/NP/PA: Dr. Billy Fischer  PCP: Tammi Sou, MD   Patient coming from: Home  Chief Complaint: Fever and chills  HPI: Shane [name] is a 84 y.o. male with medical history significant of bladder cancer status post Ileal conduit, calcific pancreatitis, chronic kidney disease stage III, coronary artery disease, aortic stenosis hypertension, hyperlipidemia, recurring UTIs, recurrent GI bleeds, history of CVA, diabetes, duodenal ulcer who presented to the ER with recurrent falls fever and weakness.  Also altered mental status.Patient apparently was trying to go to the bathroom yesterday when he had a fall.  He feels so weak.  He will had COVID-19 in January which he recovered from but has had 3 other episodes of pneumonia since then he has continued to be weak and debilitated.  His weakness has gotten worse in the last 2 to 3 days.  He came to the ER where he was found to have have SIRS criteria.  Urinalysis and x-rays showed possible UTI and pneumonia.  At this point he appears to have sepsis secondary to UTI and possible pneumonia.  He additionally has features of endorgan damage making him have severe sepsis.  He is being admitted to the hospital for further evaluation.  He denies any hematemesis.  No melena no bright red blood per rectum..  ED Course: Temperature 100.9 blood pressure 84/41 pulse 97 respirate 25 oxygen sat 96% room air.  Venous pH is 7.196.  Sodium 132 potassium 3.6 chloride 111 CO2 obtain glucose 200 BUN 54 creatinine 2.58 and calcium 7.9.  CK is 143 lactic acid 3.0 white count 12.8 hemoglobin 11.8.  Platelets 285.  PT 17.2 INR 1.5.  Urinalysis showed cloudy urine with many bacteria WBC more than 50 RBC 6-10 large leukocytes.  Chest x-ray showed subsegmental opacities at the left lung base probably atelectasis or scarring possible pneumonia.  Head CT without contrast showed no  acute findings.  Patient being admitted with severe sepsis secondary to UTI and possible pneumonia  Review of Systems: As per HPI otherwise 10 point review of systems negative.    Past Medical History:  Diagnosis Date  . Abnormal EKG 2014   This led to stress test which was abnl, which led to cardiology referral: cath showed small vessel dz of 2nd diag branch with 70-80% stenosis--preserved LV function  . Aortic atherosclerosis ([place])    w/out aneurism  . Bilateral renal cysts    Non-complex (CT 03/2016): no enhancement, coarse calcifications, mass effect does give false appearance of hydro on non-contract series (followed by urol).  . Bladder cancer ([place]) 03/2016   Invasive high grade urothelial carcinoma.  No mets at dx.  Cystoscopy + bx and partial resection of tumor in hosp when admitted for gross hematuria and AUR.  Neoadj chemo 06/2016, then got cystoprostatectomy. (Urostomy--ileal conduit urinary diversion with refluxing anastamoses--has RLQ urostomy)-gets routine surveillance by urol (Dr. Tresa Moore), most recent 04/2017--no sign of recurrence--obs status  . CAD (coronary artery disease)    small vessel dz x 1 vessel on cath 2014: med mgmt  . Cerebellar cerebrovascular accident (CVA) without late effect 10/2017   Left, punctate: DAPT therapy x 3 wks per Dr. Leonie Man, neuro, then ASA 81mg  alone.  Stable as of 11/2017 f/u with neuro, Dr. Erlinda Hong.  . Chronic calcific pancreatitis ([place]) 03/2016   +stable on f/u imaging 11/2018, + 2 small pseudocysts  . Chronic renal insufficiency, stage 2 (mild) 2019  Borderline II/III  . Diabetes mellitus without complication ([place])    Insulin dependent; DKA admission 10/2017.  . GI bleed 12/2017   Presented with melena and Hb around 5.  Transfused 5 U pRBCs, started to get hematochezia in hosp: no clear explanation for GI bleeding was found on EGD or colonoscopy.  CT abd w/out acute abnormality.  Upper GI w/SBFT showed subtle duodenal erosion.  [name] Heart murmur,  systolic    Noted in prior PCP's records.  I recommended echocardiogram 07/2016 but pt declined.  [name] History of Bell's palsy   . History of chemotherapy   . Hyperlipidemia   . Hypertension   . IDA (iron deficiency anemia) 12/2017   GI bleed  . Moderate aortic stenosis    Echo 03/2019. Pt asymptomatic.  Rpt 1 yr.   . Pancreas cyst 03/2016   CT abd/pelv 12/2017 for GI bleed w/u showed 1.6 cm pancreatic head cyst-->stable and likely benign.  Abd MRI 11/2018->stable pancreatic pseudocysts + chronic pancreatitis changes. Rpt MRI abd w &w/out contrast 2 yrs (approx 11/2020).  [name] Umbilical hernia    Repaired when he got his bladder surgery.    Past Surgical History:  Procedure Laterality Date  . BIOPSY  12/18/2017   Procedure: BIOPSY;  Surgeon: Doran Stabler, MD;  Location: [facility];  Service: Gastroenterology;;  . CARDIAC CATHETERIZATION  02/09/2013   small vessel dz of 2nd diag branch with 70-80% stenosis--preserved LV function. Medical therapy rec'd. Agustin Cree, PA)  . CARDIOVASCULAR STRESS TEST  01/26/2013   No ischemia.  Wall motion abnormalities at inferior base suggesting small area of infarction.  EF 64%.  . CAROTID ARTERY DOPPLERS  10/15/2017   1-39% stenosis R ICA, no stenosis L ICA.  [name] CATARACT EXTRACTION    . COLONOSCOPY N/A 12/19/2017   Clotted blood in cecum w/out any underlying abnormality found -->entired colon and terminal ilium normal.  Procedure: COLONOSCOPY;  Surgeon: Doran Stabler, MD;  Location: [facility];  Service: Gastroenterology;  Laterality: N/A;  . cystoprostatectomy w/LN surgery  10/17/2016   Ileal conduit: this is an incontinent urine diversion that directs urine from the ureters through a segment of isolated bowel to the surface of the abdominal wall via a cutaneous stoma, and urine drains continuously into an external ostomy appliance.  . CYSTOSCOPY WITH INJECTION N/A 10/17/2016   Procedure: CYSTOSCOPY WITH INJECTION OF INDOCYANINE GREEN DYE;  Surgeon:  Alexis Frock, MD;  Location: [facility];  Service: Urology;  Laterality: N/A;  . ESOPHAGOGASTRODUODENOSCOPY N/A 12/18/2017   Mild chronic gastritis, no metaplasia or dysplasia.  H pylori pending as of 12/21/17.  nonbleeding duodenal ulcers.  Procedure: ESOPHAGOGASTRODUODENOSCOPY (EGD);  Surgeon: Doran Stabler, MD;  Location: [facility];  Service: Gastroenterology;  Laterality: N/A;  . EYE SURGERY     cataract surgery bilat   . IR GENERIC HISTORICAL  05/12/2016   IR US GUIDE VASC ACCESS RIGHT 05/12/2016 Greggory Keen, MD [facility]  . IR GENERIC HISTORICAL  05/12/2016   IR FLUORO GUIDE PORT INSERTION RIGHT 05/12/2016 Greggory Keen, MD [facility]  . IR REMOVAL TUN ACCESS W/ PORT W/O FL MOD SED  07/03/2017  . OSTOMY     urostomy  . TONSILLECTOMY AND ADENOIDECTOMY  Remote  . TRANSTHORACIC ECHOCARDIOGRAM  10/16/2017; 03/2019   10/2017 EF 65-70%, normal wall motion, grd I DD.  03/2019 same but also new moderate aortic stenosis. Rpt echo 1 yr.  . TRANSURETHRAL RESECTION OF PROSTATE N/A 04/02/2016   Procedure: CYSTOSCOPY, TRANSURETHRAL  RESECTION OF BLADDER TUMOR;  Surgeon: [name], MD;  Location: [facility];  Service: Urology;  Laterality: N/A;  . UMBILICAL HERNIA REPAIR  10/2016  . VASECTOMY       reports that he has quit smoking. He quit after 3.00 years of use. He has never used smokeless tobacco. He reports that he does not drink alcohol and does not use drugs.  Allergies  Allergen Reactions  . Other Swelling and Other (See Comments)    Pt states had swelling of the eye when having eye surgery and the anesthetic placed in eye- recovered the same day    Family History  Problem Relation Age of Onset  . Diabetes Mellitus I Mother   . Diabetes Mellitus I Sister   . Stroke Paternal Grandfather      Prior to Admission medications   Medication Sig Start Date End Date Taking? Authorizing Provider  albuterol (VENTOLIN HFA) 108 (90 Base) MCG/ACT inhaler Inhale 2 puffs into the lungs  every 6 (six) hours as needed. Patient taking differently: Inhale 2 puffs into the lungs every 6 (six) hours as needed for wheezing or shortness of breath.  07/22/19  Yes Saguier, Percell Miller, PA-C  cetirizine (ZYRTEC) 10 MG tablet Take 1 tablet (10 mg total) by mouth daily. Patient taking differently: Take 10 mg by mouth daily as needed for allergies or rhinitis.  02/18/19  Yes McGowen, [name], MD  Continuous Blood Gluc Sensor (FREESTYLE LIBRE 14 DAY SENSOR) MISC USE AS DIRECTED Patient taking differently: Inject 1 patch into the skin every 14 (fourteen) days.  04/05/19  Yes McGowen, [name], MD  Ferrous Sulfate 27 MG TABS Take 27 mg by mouth daily with breakfast.   Yes [provider]  Omega-3 Fatty Acids (FISH OIL PO) Take 1 capsule by mouth daily.    Yes [provider]  pravastatin (PRAVACHOL) 20 MG tablet TAKE 1 TABLET BY MOUTH EVERY DAY Patient taking differently: Take 20 mg by mouth daily.  10/31/19  Yes McGowen, [name], MD  aspirin EC 81 MG tablet Take 1 tablet (81 mg total) by mouth daily. Patient not taking: Reported on 04/08/2020 05/17/19   Tammi Sou, MD  azithromycin (ZITHROMAX) 250 MG tablet Take 2 tablets by mouth on day 1, followed by 1 tablet by mouth daily for 4 days. Patient not taking: Reported on 04/08/2020 07/22/19   Saguier, Percell Miller, PA-C  benzonatate (TESSALON) 100 MG capsule Take 1 capsule (100 mg total) by mouth 3 (three) times daily as needed for cough. Patient not taking: Reported on 04/08/2020 07/22/19   Saguier, Percell Miller, PA-C  FERROUS SULFATE PO Take 27 mg by mouth. Takes 1 daily Patient not taking: Reported on 04/08/2020    [provider]  insulin aspart (NOVOLOG FLEXPEN) 100 UNIT/ML FlexPen CBG < 70: implement hypoglycemia protocol CBG 70 - 120: 3 units CBG 121 - 150: 5 units CBG 151 - 200: 6 units CBG 201 - 250: 8 units CBG 251 - 300: 11 units CBG 301 - 350: 14 units CBG 351 - 400: 18 units CBG > 400: call MD 02/04/18   Tammi Sou,  MD  Insulin Glargine (LANTUS SOLOSTAR) 100 UNIT/ML Solostar Pen INJECT 5 UNITS SUBQ AT BEDTIME Patient taking differently: Inject 5 Units into the skin at bedtime. INJECT 5 UNITS SUBQ AT BEDTIME 04/07/19   McGowen, [name], MD  Insulin Pen Needle (PEN NEEDLES) 32G X 4 MM MISC 1 each by Does not apply route daily. 10/07/17   McGowen,  [name], MD  Insulin Pen Needle 31G X 5 MM MISC Inject insulin via insulin pen 3 x daily with meals 11/17/17   McGowen, [name], MD  Lancets 30G MISC Use to check blood sugar 4 times daily 01/21/18   McGowen, [name], MD  lisinopril (ZESTRIL) 20 MG tablet Take 1 tablet (20 mg total) by mouth 2 (two) times daily. Patient not taking: Reported on 04/08/2020 05/17/19   Tammi Sou, MD    Physical Exam: Vitals:   04/08/20 2330 04/08/20 2345 04/09/20 0000 04/09/20 0015  BP: 111/63 110/67 114/68 119/70  Pulse: 88 91 90 93  Resp: 19 18 (!) 23 (!) 21  Temp:      TempSrc:      SpO2: 97% 97% 96% 96%  Weight:      Height:          Constitutional: Frail, no acute distress Vitals:   04/08/20 2330 04/08/20 2345 04/09/20 0000 04/09/20 0015  BP: 111/63 110/67 114/68 119/70  Pulse: 88 91 90 93  Resp: 19 18 (!) 23 (!) 21  Temp:      TempSrc:      SpO2: 97% 97% 96% 96%  Weight:      Height:       Eyes: PERRL, lids and conjunctivae normal ENMT: Mucous membranes are dry,. Posterior pharynx clear of any exudate or lesions.Normal dentition.  Neck: normal, supple, no masses, no thyromegaly Respiratory: Poor inspiration, clear to auscultation bilaterally, no wheezing, no crackles. Normal respiratory effort. No accessory muscle use.  Cardiovascular: Regular rate and rhythm, no murmurs / rubs / gallops. No extremity edema. 2+ pedal pulses. No carotid bruits.  Abdomen: no tenderness, no masses palpated. No hepatosplenomegaly. Bowel sounds positive.  Ileostomy bag in place no obvious issues Musculoskeletal: no clubbing / cyanosis. No joint deformity upper and lower  extremities. Good ROM, no contractures. Normal muscle tone.  Skin: no rashes, lesions, ulcers. No induration Neurologic: CN 2-12 grossly intact. Sensation intact, DTR normal. Strength 5/5 in all 4.  Psychiatric: Awake and alert, slightly confused.     Labs on Admission: I have personally reviewed following labs and imaging studies  CBC: Recent Labs  Lab 04/08/20 2120 04/08/20 2303  WBC 12.8*  --   NEUTROABS 11.8*  --   HGB 11.8* 11.6*  HCT 37.3* 34.0*  MCV 94.0  --   PLT 285  --    Basic Metabolic Panel: Recent Labs  Lab 04/08/20 2120 04/08/20 2303  NA 134* 141  K 3.6 4.0  CL 111  --   CO2 10*  --   GLUCOSE 200*  --   BUN 54*  --   CREATININE 2.58*  --   CALCIUM 7.9*  --    GFR: Estimated Creatinine Clearance: 23 mL/min (A) (by C-G formula based on SCr of 2.58 mg/dL (H)). Liver Function Tests: Recent Labs  Lab 04/08/20 2120  AST 15  ALT 17  ALKPHOS 70  BILITOT 0.5  PROT 6.7  ALBUMIN 2.9*   No results for input(s): LIPASE, AMYLASE in the last 168 hours. No results for input(s): AMMONIA in the last 168 hours. Coagulation Profile: Recent Labs  Lab 04/08/20 2153  INR 1.5*   Cardiac Enzymes: Recent Labs  Lab 04/08/20 2120  CKTOTAL 143   BNP (last 3 results) No results for input(s): PROBNP in the last 8760 hours. HbA1C: No results for input(s): HGBA1C in the last 72 hours. CBG: No results for input(s): GLUCAP in the last 168 hours.  Lipid Profile: No results for input(s): CHOL, HDL, LDLCALC, TRIG, CHOLHDL, LDLDIRECT in the last 72 hours. Thyroid Function Tests: No results for input(s): TSH, T4TOTAL, FREET4, T3FREE, THYROIDAB in the last 72 hours. Anemia Panel: No results for input(s): VITAMINB12, FOLATE, FERRITIN, TIBC, IRON, RETICCTPCT in the last 72 hours. Urine analysis:    Component Value Date/Time   COLORURINE YELLOW 04/08/2020 2110   APPEARANCEUR CLOUDY (A) 04/08/2020 2110   LABSPEC 1.010 04/08/2020 2110   PHURINE 6.0 04/08/2020 2110    GLUCOSEU NEGATIVE 04/08/2020 2110   HGBUR MODERATE (A) 04/08/2020 2110   BILIRUBINUR NEGATIVE 04/08/2020 2110   BILIRUBINUR negative 04/14/2016 [place] 04/08/2020 2110   PROTEINUR 30 (A) 04/08/2020 2110   UROBILINOGEN 0.2 04/14/2016 1456   NITRITE NEGATIVE 04/08/2020 2110   LEUKOCYTESUR LARGE (A) 04/08/2020 2110   Sepsis Labs: @LABRCNTIP (procalcitonin:4,lacticidven:4) ) Recent Results (from the past 240 hour(s))  Respiratory Panel by RT PCR (Flu A&B, Covid) - Nasopharyngeal Swab     Status: None   Collection Time: 04/08/20  9:00 PM   Specimen: Nasopharyngeal Swab  Result Value Ref Range Status   SARS Coronavirus 2 by RT PCR NEGATIVE NEGATIVE Final    Comment: (NOTE) SARS-CoV-2 target nucleic acids are NOT DETECTED.  The SARS-CoV-2 RNA is generally detectable in upper respiratoy specimens during the acute phase of infection. The lowest concentration of SARS-CoV-2 viral copies this assay can detect is 131 copies/mL. A negative result does not preclude SARS-Cov-2 infection and should not be used as the sole basis for treatment or other patient management decisions. A negative result may occur with  improper specimen collection/handling, submission of specimen other than nasopharyngeal swab, presence of viral mutation(s) within the areas targeted by this assay, and inadequate number of viral copies (<131 copies/mL). A negative result must be combined with clinical observations, patient history, and epidemiological information. The expected result is Negative.  Fact Sheet for Patients:  PinkCheek.be  Fact Sheet for Healthcare Providers:  GravelBags.it  This test is no t yet approved or cleared by the [place] FDA and  has been authorized for detection and/or diagnosis of SARS-CoV-2 by FDA under an Emergency Use Authorization (EUA). This EUA will remain  in effect (meaning this test can be used) for  the duration of the COVID-19 declaration under Section 564(b)(1) of the Act, 21 U.S.C. section 360bbb-3(b)(1), unless the authorization is terminated or revoked sooner.     Influenza A by PCR NEGATIVE NEGATIVE Final   Influenza B by PCR NEGATIVE NEGATIVE Final    Comment: (NOTE) The Xpert Xpress SARS-CoV-2/FLU/RSV assay is intended as an aid in  the diagnosis of influenza from Nasopharyngeal swab specimens and  should not be used as a sole basis for treatment. Nasal washings and  aspirates are unacceptable for Xpert Xpress SARS-CoV-2/FLU/RSV  testing.  Fact Sheet for Patients: PinkCheek.be  Fact Sheet for Healthcare Providers: GravelBags.it  This test is not yet approved or cleared by the [place] FDA and  has been authorized for detection and/or diagnosis of SARS-CoV-2 by  FDA under an Emergency Use Authorization (EUA). This EUA will remain  in effect (meaning this test can be used) for the duration of the  Covid-19 declaration under Section 564(b)(1) of the Act, 21  U.S.C. section 360bbb-3(b)(1), unless the authorization is  terminated or revoked. Performed at [facility], [place] [address] [place], [place] [address]      Radiological Exams on Admission: CT Head Wo Contrast  Result Date: 04/08/2020  CLINICAL DATA:  Fall yesterday.  Fever and confusion today. EXAM: CT HEAD WITHOUT CONTRAST TECHNIQUE: Contiguous axial images were obtained from the base of the skull through the vertex without intravenous contrast. COMPARISON:  CT head 10/12/2017.  MRI brain 10/14/2017 FINDINGS: Brain: No evidence of acute infarction, hemorrhage, hydrocephalus, extra-axial collection or mass lesion/mass effect. Diffuse cerebral atrophy. Mild ventricular dilatation consistent with central atrophy. Low-attenuation changes in the deep white matter consistent small vessel ischemia. Parenchymal calcifications in the basal ganglia and  cerebellum. Vascular: Intracranial arterial vascular calcifications are present. Skull: Depression and deformity of the outer table of the right frontal sinus, new since previous study. This is consistent with a depression fracture. No significant overlying soft tissue swelling suggests this to represent an old rather than acute fracture. Calvarium appears otherwise intact. Sinuses/Orbits: Paranasal sinuses and mastoid air cells are clear. Other: None. IMPRESSION: 1. No acute intracranial abnormalities. 2. Chronic atrophy and small vessel ischemic changes. 3. Depressed fracture deformity of the outer table of the right frontal sinus. No significant overlying soft tissue swelling and lack of fluid in the sinus suggests this to represent an old rather than acute fracture. Electronically Signed   By: Lucienne Capers M.D.   On: 04/08/2020 23:33   DG Chest Port 1 View  Result Date: 04/08/2020 CLINICAL DATA:  Question sepsis.  Fever today, confusion. EXAM: PORTABLE CHEST 1 VIEW COMPARISON:  10/12/2017 FINDINGS: The cardiomediastinal contours are normal. Normal heart size. Aortic atherosclerosis. Subsegmental opacities at the left lung base. Pulmonary vasculature is normal. No pleural effusion or pneumothorax. No acute osseous abnormalities are seen. The bones are under mineralized. There is likely interposition of colon under the right hemidiaphragm. IMPRESSION: Subsegmental opacities at the left lung base, favor atelectasis or scarring. Airspace disease such as pneumonia is also considered in the setting of fever. Electronically Signed   By: Keith Rake M.D.   On: 04/08/2020 21:53    EKG: Independently reviewed.  It shows sinus rhythm with a rate of 94.  Evidence of LAD and left anterior fascicular block.  Mild voltage criteria for LVH.  Assessment/Plan Principal Problem:   Severe sepsis (HCC) Active Problems:   Hypertension   Diabetes mellitus without complication ([place])   UTI (urinary tract  infection)   Malignant neoplasm of urinary bladder (HCC)   S/P ileal conduit (HCC)   AKI (acute kidney injury) ([place])   Lobar pneumonia (HCC)     #1 severe sepsis: Patient is septic based on his temperature, leukocytosis, and respiratory rate.  He is has severe sepsis as result of hypotension, increased lactic acid of 3, and acute kidney injury.  We will admit the patient to monitored bed.  Aggressive hydration.  Treat for both pneumonia and UTI with Rocephin and Zithromax.  Follow blood culture results.  #2 acute kidney injury: BUN/creatinine has increased over his chronic kidney disease stage III.  Probably prerenal.  Aggressively hydrate and monitor.  #3 nongap acidosis: Probably combination of AKI with contraction alkalosis.  May be related to his ileal conduit.  I will switch IV fluids to bicarb.  Follow closely.  #4 UTI: Probably complex due to his ileal conduit.  Continue antibiotics as above.  Obtain urine cultures.  #5 possible pneumonia: Treat with Rocephin and Zithromax for community-acquired.  #6 history of urinary bladder cancer: Patient has had surgery on treatment.  Defer to his oncologist.  #7 diabetes: Continue with sliding scale insulin and supportive care.  #8 hypertension: Resume home regimen and monitor when blood  pressure is rebounded.  Currently hypotensive.   DVT prophylaxis: Heparin Code Status: Full code Family Communication: No family at bedside Disposition Plan: To be determined Consults called: None Admission status: Inpatient  Severity of Illness: The appropriate patient status for this patient is INPATIENT. Inpatient status is judged to be reasonable and necessary in order to provide the required intensity of service to ensure the patient's safety. The patient's presenting symptoms, physical exam findings, and initial radiographic and laboratory data in the context of their chronic comorbidities is felt to place them at high risk for further clinical  deterioration. Furthermore, it is not anticipated that the patient will be medically stable for discharge from the hospital within 2 midnights of admission. The following factors support the patient status of inpatient.   " The patient's presenting symptoms include fall with generalized weakness. " The worrisome physical exam findings include frail and weak. " The initial radiographic and laboratory data are worrisome because of this severe sepsis. " The chronic co-morbidities include history of bladder cancer.   * I certify that at the point of admission it is my clinical judgment that the patient will require inpatient hospital care spanning beyond 2 midnights from the point of admission due to high intensity of service, high risk for further deterioration and high frequency of surveillance required.Barbette Merino MD [facility] Pager [phone]  If 7PM-7AM, please contact night-coverage [email or web address] Password [facility]  04/09/2020, 12:39 AM

## 2020-04-09 NOTE — Progress Notes (Signed)
PROGRESS NOTE    Shane [name]  YHC:[id number] DOB: 06-Nov-1935 DOA: 04/08/2020 PCP: Tammi Sou, MD   Brief Narrative:  HPI: Shane [name] is a 84 y.o. male with medical history significant of bladder cancer status post Ileal conduit, calcific pancreatitis, chronic kidney disease stage III, coronary artery disease, aortic stenosis hypertension, hyperlipidemia, recurring UTIs, recurrent GI bleeds, history of CVA, diabetes, duodenal ulcer who presented to the ER with recurrent falls fever and weakness.  Also altered mental status.Patient apparently was trying to go to the bathroom yesterday when he had a fall.  He feels so weak.  He will had COVID-19 in January which he recovered from but has had 3 other episodes of pneumonia since then he has continued to be weak and debilitated.  His weakness has gotten worse in the last 2 to 3 days.  He came to the ER where he was found to have have SIRS criteria.  Urinalysis and x-rays showed possible UTI and pneumonia.  At this point he appears to have sepsis secondary to UTI and possible pneumonia.  He additionally has features of endorgan damage making him have severe sepsis.  He is being admitted to the hospital for further evaluation.  He denies any hematemesis.  No melena no bright red blood per rectum..  ED Course: Temperature 100.9 blood pressure 84/41 pulse 97 respirate 25 oxygen sat 96% room air.  Venous pH is 7.196.  Sodium 132 potassium 3.6 chloride 111 CO2 obtain glucose 200 BUN 54 creatinine 2.58 and calcium 7.9.  CK is 143 lactic acid 3.0 white count 12.8 hemoglobin 11.8.  Platelets 285.  PT 17.2 INR 1.5.  Urinalysis showed cloudy urine with many bacteria WBC more than 50 RBC 6-10 large leukocytes.  Chest x-ray showed subsegmental opacities at the left lung base probably atelectasis or scarring possible pneumonia.  Head CT without contrast showed no acute findings.  Patient being admitted with severe sepsis secondary to UTI and possible  pneumonia  Assessment & Plan:   Principal Problem:   Severe sepsis ([place]) Active Problems:   Hypertension   Diabetes mellitus without complication ([place])   UTI (urinary tract infection)   Malignant neoplasm of urinary bladder ([facility])   S/P ileal conduit ([facility])   AKI (acute kidney injury) ([place])   Lobar pneumonia ([facility])   #1 severe sepsis secondary to UTI/community-acquired pneumonia: Patient met severe sepsis criteria based on fever, leukocytosis and tachypnea and endorgan damage of acute renal failure and lactic acid of 3.  Still feels miserable.  Continue Rocephin and Zithromax and follow cultures.  #2 acute kidney injury: Secondary to sepsis/ATN.  Continue hydration.  Repeat labs in the morning.  #3  Non-anion gap metabolic acidosis: Presented with bicarb of 10.  Now 11.  Continue bicarb drip.  Monitor daily.  This is likely due to sepsis and AKI.  #4.  Hypokalemia: 3.3.  Replace orally.  Recheck in the morning.  #6 history of urinary bladder cancer: Patient has had surgery on treatment.  Defer to his oncologist.  #7 diabetes: Continue with sliding scale insulin and supportive care.  #8 hypertension: Controlled.  Patient takes only lisinopril at home which is on hold due to AKI.  #9 hypokalemia: 3.3.  Replace orally.  Recheck in the morning.  DVT prophylaxis: heparin injection 5,000 Units Start: 04/09/20 0715   Code Status: Full Code  Family Communication: Wife present at bedside.  Plan of care discussed with patient and his wife in length and he verbalized understanding and  agreed with it.  Status is: Inpatient  Remains inpatient appropriate because:Inpatient level of care appropriate due to severity of illness   Dispo: The patient is from: Home              Anticipated d/c is to: Home              Anticipated d/c date is: 2 days              Patient currently is not medically stable to d/c.        Estimated body mass index is 29.35 kg/m as calculated from the  following:   Height as of this encounter: 5' 7" (1.702 m).   Weight as of this encounter: 85 kg.      Nutritional status:               Consultants:   None  Procedures:   None  Antimicrobials:  Anti-infectives (From admission, onward)   Start     Dose/Rate Route Frequency Ordered Stop   04/09/20 2130  ceFEPIme (MAXIPIME) 2 g in sodium chloride 0.9 % 100 mL IVPB  Status:  Discontinued        2 g 200 mL/hr over 30 Minutes Intravenous Every 24 hours 04/08/20 2207 04/09/20 0957   04/09/20 0715  cefTRIAXone (ROCEPHIN) 2 g in sodium chloride 0.9 % 100 mL IVPB        2 g 200 mL/hr over 30 Minutes Intravenous Every 24 hours 04/09/20 0711     04/09/20 0715  azithromycin (ZITHROMAX) 500 mg in sodium chloride 0.9 % 250 mL IVPB        500 mg 250 mL/hr over 60 Minutes Intravenous Every 24 hours 04/09/20 0711     04/08/20 2207  vancomycin variable dose per unstable renal function (pharmacist dosing)  Status:  Discontinued         Does not apply See admin instructions 04/08/20 2207 04/09/20 0956   04/08/20 2130  ceFEPIme (MAXIPIME) 2 g in sodium chloride 0.9 % 100 mL IVPB        2 g 200 mL/hr over 30 Minutes Intravenous  Once 04/08/20 2119 04/08/20 2213   04/08/20 2130  metroNIDAZOLE (FLAGYL) IVPB 500 mg        500 mg 100 mL/hr over 60 Minutes Intravenous  Once 04/08/20 2119 04/08/20 2231   04/08/20 2130  vancomycin (VANCOCIN) IVPB 1000 mg/200 mL premix  Status:  Discontinued        1,000 mg 200 mL/hr over 60 Minutes Intravenous  Once 04/08/20 2119 04/08/20 2126   04/08/20 2130  vancomycin (VANCOREADY) IVPB 1750 mg/350 mL        1,750 mg 175 mL/hr over 120 Minutes Intravenous  Once 04/08/20 2126 04/08/20 2351         Subjective: Seen and examined.  Still feels " miserable" did not have any specific complaints.  Objective: Vitals:   04/09/20 1300 04/09/20 1315 04/09/20 1330 04/09/20 1400  BP: 116/60 117/69 122/73 (!) 122/57  Pulse: 66 66 (!) 59 64  Resp: (!) 24 (!)  24 (!) 22 (!) 24  Temp:      TempSrc:      SpO2: 98% 97% 97% 100%  Weight:      Height:        Intake/Output Summary (Last 24 hours) at 04/09/2020 1429 Last data filed at 04/09/2020 1246 Gross per 24 hour  Intake 3632.9 ml  Output 2800 ml  Net 832.9 ml   [employer]  04/08/20 2104  Weight: 85 kg    Examination:  General exam: Appears calm and comfortable  Respiratory system: Coarse breath sounds with rhonchi bilaterally. Respiratory effort normal. Cardiovascular system: S1 & S2 heard, RRR. No JVD, murmurs, rubs, gallops or clicks. No pedal edema. Gastrointestinal system: Abdomen is nondistended, soft and nontender. No organomegaly or masses felt. Normal bowel sounds heard. Central nervous system: Alert and oriented. No focal neurological deficits. Extremities: Symmetric 5 x 5 power. Skin: No rashes, lesions or ulcers Psychiatry: Judgement and insight appear normal. Mood & affect appropriate.    Data Reviewed: I have personally reviewed following labs and imaging studies  CBC: Recent Labs  Lab 04/08/20 2120 04/08/20 2303 04/09/20 0725  WBC 12.8*  --  13.3*  NEUTROABS 11.8*  --   --   HGB 11.8* 11.6* 10.6*  HCT 37.3* 34.0* 33.0*  MCV 94.0  --  95.1  PLT 285  --  100   Basic Metabolic Panel: Recent Labs  Lab 04/08/20 2120 04/08/20 2303 04/09/20 0725  NA 134* 141 135  K 3.6 4.0 3.3*  CL 111  --  115*  CO2 10*  --  11*  GLUCOSE 200*  --  179*  BUN 54*  --  50*  CREATININE 2.58*  --  2.52*  CALCIUM 7.9*  --  7.8*   GFR: Estimated Creatinine Clearance: 23.6 mL/min (A) (by C-G formula based on SCr of 2.52 mg/dL (H)). Liver Function Tests: Recent Labs  Lab 04/08/20 2120 04/09/20 0725  AST 15 16  ALT 17 16  ALKPHOS 70 62  BILITOT 0.5 0.5  PROT 6.7 5.7*  ALBUMIN 2.9* 2.4*   No results for input(s): LIPASE, AMYLASE in the last 168 hours. No results for input(s): AMMONIA in the last 168 hours. Coagulation Profile: Recent Labs  Lab 04/08/20 2153  04/09/20 0725  INR 1.5* 1.5*   Cardiac Enzymes: Recent Labs  Lab 04/08/20 2120  CKTOTAL 143   BNP (last 3 results) No results for input(s): PROBNP in the last 8760 hours. HbA1C: Recent Labs    04/09/20 0725  HGBA1C 6.3*   CBG: Recent Labs  Lab 04/09/20 0730 04/09/20 1126  GLUCAP 155* 169*   Lipid Profile: No results for input(s): CHOL, HDL, LDLCALC, TRIG, CHOLHDL, LDLDIRECT in the last 72 hours. Thyroid Function Tests: No results for input(s): TSH, T4TOTAL, FREET4, T3FREE, THYROIDAB in the last 72 hours. Anemia Panel: No results for input(s): VITAMINB12, FOLATE, FERRITIN, TIBC, IRON, RETICCTPCT in the last 72 hours. Sepsis Labs: Recent Labs  Lab 04/08/20 2120 04/08/20 2250 04/09/20 0411 04/09/20 0725 04/09/20 0943  PROCALCITON  --   --   --  9.21  --   LATICACIDVEN 2.2* 3.0* 1.0  --  1.6    Recent Results (from the past 240 hour(s))  Respiratory Panel by RT PCR (Flu A&B, Covid) - Nasopharyngeal Swab     Status: None   Collection Time: 04/08/20  9:00 PM   Specimen: Nasopharyngeal Swab  Result Value Ref Range Status   SARS Coronavirus 2 by RT PCR NEGATIVE NEGATIVE Final    Comment: (NOTE) SARS-CoV-2 target nucleic acids are NOT DETECTED.  The SARS-CoV-2 RNA is generally detectable in upper respiratoy specimens during the acute phase of infection. The lowest concentration of SARS-CoV-2 viral copies this assay can detect is 131 copies/mL. A negative result does not preclude SARS-Cov-2 infection and should not be used as the sole basis for treatment or other patient management decisions. A negative result may occur with  improper specimen collection/handling, submission of specimen other than nasopharyngeal swab, presence of viral mutation(s) within the areas targeted by this assay, and inadequate number of viral copies (<131 copies/mL). A negative result must be combined with clinical observations, patient history, and epidemiological information.  The expected result is Negative.  Fact Sheet for Patients:  PinkCheek.be  Fact Sheet for Healthcare Providers:  GravelBags.it  This test is no t yet approved or cleared by the [place] FDA and  has been authorized for detection and/or diagnosis of SARS-CoV-2 by FDA under an Emergency Use Authorization (EUA). This EUA will remain  in effect (meaning this test can be used) for the duration of the COVID-19 declaration under Section 564(b)(1) of the Act, 21 U.S.C. section 360bbb-3(b)(1), unless the authorization is terminated or revoked sooner.     Influenza A by PCR NEGATIVE NEGATIVE Final   Influenza B by PCR NEGATIVE NEGATIVE Final    Comment: (NOTE) The Xpert Xpress SARS-CoV-2/FLU/RSV assay is intended as an aid in  the diagnosis of influenza from Nasopharyngeal swab specimens and  should not be used as a sole basis for treatment. Nasal washings and  aspirates are unacceptable for Xpert Xpress SARS-CoV-2/FLU/RSV  testing.  Fact Sheet for Patients: PinkCheek.be  Fact Sheet for Healthcare Providers: GravelBags.it  This test is not yet approved or cleared by the [place] FDA and  has been authorized for detection and/or diagnosis of SARS-CoV-2 by  FDA under an Emergency Use Authorization (EUA). This EUA will remain  in effect (meaning this test can be used) for the duration of the  Covid-19 declaration under Section 564(b)(1) of the Act, 21  U.S.C. section 360bbb-3(b)(1), unless the authorization is  terminated or revoked. Performed at [facility], [place] [address], [place], [place] [address]   Blood Culture (routine x 2)     Status: None (Preliminary result)   Collection Time: 04/08/20  9:25 PM   Specimen: BLOOD LEFT ARM  Result Value Ref Range Status   Specimen Description BLOOD LEFT ARM  Final   Special Requests   Final    BOTTLES DRAWN  AEROBIC AND ANAEROBIC Blood Culture adequate volume   Culture   Final    NO GROWTH < 24 HOURS Performed at [facility], [place] [address] [place], [place] [address]    Report Status PENDING  Incomplete  Blood Culture (routine x 2)     Status: None (Preliminary result)   Collection Time: 04/08/20  9:34 PM   Specimen: BLOOD RIGHT HAND  Result Value Ref Range Status   Specimen Description BLOOD RIGHT HAND  Final   Special Requests   Final    BOTTLES DRAWN AEROBIC AND ANAEROBIC Blood Culture results may not be optimal due to an inadequate volume of blood received in culture bottles   Culture   Final    NO GROWTH < 24 HOURS Performed at [facility], [place] [address] [place], [place] [address]    Report Status PENDING  Incomplete      Radiology Studies: CT Head Wo Contrast  Result Date: 04/08/2020 CLINICAL DATA:  Fall yesterday.  Fever and confusion today. EXAM: CT HEAD WITHOUT CONTRAST TECHNIQUE: Contiguous axial images were obtained from the base of the skull through the vertex without intravenous contrast. COMPARISON:  CT head 10/12/2017.  MRI brain 10/14/2017 FINDINGS: Brain: No evidence of acute infarction, hemorrhage, hydrocephalus, extra-axial collection or mass lesion/mass effect. Diffuse cerebral atrophy. Mild ventricular dilatation consistent with central atrophy. Low-attenuation changes  in the deep white matter consistent small vessel ischemia. Parenchymal calcifications in the basal ganglia and cerebellum. Vascular: Intracranial arterial vascular calcifications are present. Skull: Depression and deformity of the outer table of the right frontal sinus, new since previous study. This is consistent with a depression fracture. No significant overlying soft tissue swelling suggests this to represent an old rather than acute fracture. Calvarium appears otherwise intact. Sinuses/Orbits: Paranasal sinuses and mastoid air cells are clear. Other: None. IMPRESSION: 1. No acute  intracranial abnormalities. 2. Chronic atrophy and small vessel ischemic changes. 3. Depressed fracture deformity of the outer table of the right frontal sinus. No significant overlying soft tissue swelling and lack of fluid in the sinus suggests this to represent an old rather than acute fracture. Electronically Signed   By: Lucienne Capers M.D.   On: 04/08/2020 23:33   DG Chest Port 1 View  Result Date: 04/08/2020 CLINICAL DATA:  Question sepsis.  Fever today, confusion. EXAM: PORTABLE CHEST 1 VIEW COMPARISON:  10/12/2017 FINDINGS: The cardiomediastinal contours are normal. Normal heart size. Aortic atherosclerosis. Subsegmental opacities at the left lung base. Pulmonary vasculature is normal. No pleural effusion or pneumothorax. No acute osseous abnormalities are seen. The bones are under mineralized. There is likely interposition of colon under the right hemidiaphragm. IMPRESSION: Subsegmental opacities at the left lung base, favor atelectasis or scarring. Airspace disease such as pneumonia is also considered in the setting of fever. Electronically Signed   By: Keith Rake M.D.   On: 04/08/2020 21:53    Scheduled Meds: . ferrous sulfate  325 mg Oral Q breakfast  . heparin  5,000 Units Subcutaneous Q8H  . insulin aspart  0-5 Units Subcutaneous QHS  . insulin aspart  0-9 Units Subcutaneous TID WC  . insulin glargine  5 Units Subcutaneous QHS  . loratadine  10 mg Oral Daily  . omega-3 acid ethyl esters  1 g Oral BID  . pravastatin  20 mg Oral Daily   Continuous Infusions: . azithromycin Stopped (04/09/20 0907)  . cefTRIAXone (ROCEPHIN)  IV Stopped (04/09/20 0836)  . lactated ringers 150 mL/hr at 04/09/20 1241  . sodium bicarbonate (isotonic) 150 mEq in D5W 1000 mL infusion 50 mL/hr at 04/09/20 0941     LOS: 0 days   Time spent: 37 minutes   Darliss Cheney, MD [facility]  04/09/2020, 2:29 PM   To contact the attending provider between 7A-7P or the covering provider during  after hours 7P-7A, please log into the web site [email or web address].

## 2020-04-09 NOTE — Progress Notes (Signed)
PHARMACY - PHYSICIAN COMMUNICATION CRITICAL VALUE ALERT - BLOOD CULTURE IDENTIFICATION (BCID)  Blood cultures drawn 9/26 are now growing enterococcus faecalis and pseudomonas. No resistance was noted.   Name of physician (or Provider) Contacted: Pahwani  Changes to prescribed antibiotics required: Discontinue ceftriaxone and azithromycin and start ampicillin and cefepime.   Erin Hearing PharmD., BCPS Clinical Pharmacist 04/09/2020 6:03 PM

## 2020-04-10 DIAGNOSIS — R6520 Severe sepsis without septic shock: Secondary | ICD-10-CM

## 2020-04-10 DIAGNOSIS — A419 Sepsis, unspecified organism: Secondary | ICD-10-CM

## 2020-04-10 LAB — CBG MONITORING, ED: Glucose-Capillary: 172 mg/dL — ABNORMAL HIGH (ref 70–99)

## 2020-04-10 LAB — CBC WITH DIFFERENTIAL/PLATELET
Abs Immature Granulocytes: 0.08 10*3/uL — ABNORMAL HIGH (ref 0.00–0.07)
Basophils Absolute: 0 10*3/uL (ref 0.0–0.1)
Basophils Relative: 0 %
Eosinophils Absolute: 0.1 10*3/uL (ref 0.0–0.5)
Eosinophils Relative: 1 %
HCT: 34 % — ABNORMAL LOW (ref 39.0–52.0)
Hemoglobin: 11.1 g/dL — ABNORMAL LOW (ref 13.0–17.0)
Immature Granulocytes: 1 %
Lymphocytes Relative: 6 %
Lymphs Abs: 0.7 10*3/uL (ref 0.7–4.0)
MCH: 30 pg (ref 26.0–34.0)
MCHC: 32.6 g/dL (ref 30.0–36.0)
MCV: 91.9 fL (ref 80.0–100.0)
Monocytes Absolute: 0.6 10*3/uL (ref 0.1–1.0)
Monocytes Relative: 6 %
Neutro Abs: 9.6 10*3/uL — ABNORMAL HIGH (ref 1.7–7.7)
Neutrophils Relative %: 86 %
Platelets: 234 10*3/uL (ref 150–400)
RBC: 3.7 MIL/uL — ABNORMAL LOW (ref 4.22–5.81)
RDW: 15.1 % (ref 11.5–15.5)
WBC: 11.1 10*3/uL — ABNORMAL HIGH (ref 4.0–10.5)
nRBC: 0 % (ref 0.0–0.2)

## 2020-04-10 LAB — BASIC METABOLIC PANEL
Anion gap: 12 (ref 5–15)
BUN: 46 mg/dL — ABNORMAL HIGH (ref 8–23)
CO2: 15 mmol/L — ABNORMAL LOW (ref 22–32)
Calcium: 7.8 mg/dL — ABNORMAL LOW (ref 8.9–10.3)
Chloride: 110 mmol/L (ref 98–111)
Creatinine, Ser: 2.39 mg/dL — ABNORMAL HIGH (ref 0.61–1.24)
GFR calc Af Amer: 28 mL/min — ABNORMAL LOW (ref >60)
GFR calc non Af Amer: 24 mL/min — ABNORMAL LOW (ref >60)
Glucose, Bld: 153 mg/dL — ABNORMAL HIGH (ref 70–99)
Potassium: 3.1 mmol/L — ABNORMAL LOW (ref 3.5–5.1)
Sodium: 137 mmol/L (ref 135–145)

## 2020-04-10 LAB — URINE CULTURE

## 2020-04-10 LAB — MAGNESIUM: Magnesium: 1.5 mg/dL — ABNORMAL LOW (ref 1.7–2.4)

## 2020-04-10 LAB — GLUCOSE, CAPILLARY
Glucose-Capillary: 162 mg/dL — ABNORMAL HIGH (ref 70–99)
Glucose-Capillary: 126 mg/dL — ABNORMAL HIGH (ref 70–99)
Glucose-Capillary: 129 mg/dL — ABNORMAL HIGH (ref 70–99)

## 2020-04-10 MED ORDER — POTASSIUM CHLORIDE CRYS ER 20 MEQ PO TBCR
40.0000 meq | EXTENDED_RELEASE_TABLET | ORAL | Status: AC
  Administered 2020-04-10 (×2): 40 meq via ORAL
  Filled 2020-04-10 (×2): qty 2

## 2020-04-10 MED ORDER — MAGNESIUM SULFATE 2 GM/50ML IV SOLN
2.0000 g | Freq: Once | INTRAVENOUS | Status: AC
  Administered 2020-04-10: 2 g via INTRAVENOUS
  Filled 2020-04-10: qty 50

## 2020-04-10 NOTE — ED Notes (Signed)
Attempted to call report. RN will get report at 11am.

## 2020-04-10 NOTE — Progress Notes (Signed)
PROGRESS NOTE    Shane [name]  AST:[id number] DOB: Mar 07, 1936 DOA: 04/08/2020 PCP: Tammi Sou, MD   Brief Narrative:  HPI: Shane [name] is a 84 y.o. male with medical history significant of bladder cancer status post Ileal conduit, calcific pancreatitis, chronic kidney disease stage III, coronary artery disease, aortic stenosis hypertension, hyperlipidemia, recurring UTIs, recurrent GI bleeds, history of CVA, diabetes, duodenal ulcer who presented to the ER with recurrent falls fever and weakness.  Also altered mental status.Patient apparently was trying to go to the bathroom yesterday when he had a fall.  He feels so weak.  He will had COVID-19 in January which he recovered from but has had 3 other episodes of pneumonia since then he has continued to be weak and debilitated.  His weakness has gotten worse in the last 2 to 3 days.  He came to the ER where he was found to have have SIRS criteria.  Urinalysis and x-rays showed possible UTI and pneumonia.  At this point he appears to have sepsis secondary to UTI and possible pneumonia.  He additionally has features of endorgan damage making him have severe sepsis.  He is being admitted to the hospital for further evaluation.  He denies any hematemesis.  No melena no bright red blood per rectum..  ED Course: Temperature 100.9 blood pressure 84/41 pulse 97 respirate 25 oxygen sat 96% room air.  Venous pH is 7.196.  Sodium 132 potassium 3.6 chloride 111 CO2 obtain glucose 200 BUN 54 creatinine 2.58 and calcium 7.9.  CK is 143 lactic acid 3.0 white count 12.8 hemoglobin 11.8.  Platelets 285.  PT 17.2 INR 1.5.  Urinalysis showed cloudy urine with many bacteria WBC more than 50 RBC 6-10 large leukocytes.  Chest x-ray showed subsegmental opacities at the left lung base probably atelectasis or scarring possible pneumonia.  Head CT without contrast showed no acute findings.  Patient being admitted with severe sepsis secondary to UTI and possible  pneumonia  Assessment & Plan:   Principal Problem:   Severe sepsis ([place]) Active Problems:   Hypertension   Diabetes mellitus without complication ([place])   UTI (urinary tract infection)   Malignant neoplasm of urinary bladder (H[facility])   S/P ileal conduit (HCC)   AKI (acute kidney injury) ([place])   Lobar pneumonia (H[facility])   #1 severe sepsis secondary to UTI/community-acquired pneumonia/gram-negative bacteremia: Patient met severe sepsis criteria based on fever, leukocytosis and tachypnea and endorgan damage of acute renal failure and lactic acid of 3. Feels better than yesterday. Only one of the four blood culture bottles is growing both Enterococcus faecalis and Pseudomonas aeruginosa. Antibiotics were switched to ampicillin and cefepime. ID also consulted. They recommend continuing antibiotics for another day and watch cultures closely. Appreciate ID help. Will consult PT OT.  #2 acute kidney injury: Secondary to sepsis/ATN. Creatinine only slightly improved. Continue hydration.  Repeat labs in the morning.  #3  Non-anion gap metabolic acidosis: Presented with bicarb of 10.  Now 15.  Continue bicarb drip.  Monitor daily.  This is likely due to sepsis and AKI.  #4.  Hypokalemia: 3.1. Will replace orally. Recheck in the morning.  #6 history of urinary bladder cancer: Patient has had surgery on treatment.  Defer to his oncologist.  #7 diabetes type II: Continue with sliding scale insulin and supportive care.  #8 hypertension: Controlled.  Patient takes only lisinopril at home which is on hold due to AKI.  #9 hypomagnesemia: 1.5 today. Replaced today. Recheck in the morning.  #  10: Deconditioning: We will consult PT OT.  DVT prophylaxis: heparin injection 5,000 Units Start: 04/09/20 0715   Code Status: Full Code  Family Communication: None present at bedside.  Plan of care discussed with patient in length and he verbalized understanding and agreed with it.  Status is:  Inpatient  Remains inpatient appropriate because:Inpatient level of care appropriate due to severity of illness   Dispo: The patient is from: Home              Anticipated d/c is to: Home              Anticipated d/c date is: 2 days              Patient currently is not medically stable to d/c.        Estimated body mass index is 29.35 kg/m as calculated from the following:   Height as of this encounter: $RemoveBeforeD'5\' 7"'[id number]$  (1.702 m).   Weight as of this encounter: 85 kg.      Nutritional status:               Consultants:   None  Procedures:   None  Antimicrobials:  Anti-infectives (From admission, onward)   Start     Dose/Rate Route Frequency Ordered Stop   04/10/20 2000  ceFEPIme (MAXIPIME) 2 g in sodium chloride 0.9 % 100 mL IVPB        2 g 200 mL/hr over 30 Minutes Intravenous Every 24 hours 04/09/20 1806     04/09/20 2130  ceFEPIme (MAXIPIME) 2 g in sodium chloride 0.9 % 100 mL IVPB  Status:  Discontinued        2 g 200 mL/hr over 30 Minutes Intravenous Every 24 hours 04/08/20 2207 04/09/20 0957   04/09/20 2000  ampicillin (OMNIPEN) 2 g in sodium chloride 0.9 % 100 mL IVPB        2 g 300 mL/hr over 20 Minutes Intravenous Every 8 hours 04/09/20 1806     04/09/20 1900  ceFEPIme (MAXIPIME) 2 g in sodium chloride 0.9 % 100 mL IVPB        2 g 200 mL/hr over 30 Minutes Intravenous  Once 04/09/20 1806 04/09/20 2030   04/09/20 0715  cefTRIAXone (ROCEPHIN) 2 g in sodium chloride 0.9 % 100 mL IVPB  Status:  Discontinued        2 g 200 mL/hr over 30 Minutes Intravenous Every 24 hours 04/09/20 0711 04/09/20 1804   04/09/20 0715  azithromycin (ZITHROMAX) 500 mg in sodium chloride 0.9 % 250 mL IVPB  Status:  Discontinued        500 mg 250 mL/hr over 60 Minutes Intravenous Every 24 hours 04/09/20 0711 04/09/20 1804   04/08/20 2207  vancomycin variable dose per unstable renal function (pharmacist dosing)  Status:  Discontinued         Does not apply See admin instructions  04/08/20 2207 04/09/20 0956   04/08/20 2130  ceFEPIme (MAXIPIME) 2 g in sodium chloride 0.9 % 100 mL IVPB        2 g 200 mL/hr over 30 Minutes Intravenous  Once 04/08/20 2119 04/08/20 2213   04/08/20 2130  metroNIDAZOLE (FLAGYL) IVPB 500 mg        500 mg 100 mL/hr over 60 Minutes Intravenous  Once 04/08/20 2119 04/08/20 2231   04/08/20 2130  vancomycin (VANCOCIN) IVPB 1000 mg/200 mL premix  Status:  Discontinued        1,000 mg 200 mL/hr over 60  Minutes Intravenous  Once 04/08/20 2119 04/08/20 2126   04/08/20 2130  vancomycin (VANCOREADY) IVPB 1750 mg/350 mL        1,750 mg 175 mL/hr over 120 Minutes Intravenous  Once 04/08/20 2126 04/08/20 2351         Subjective: Seen and examined. Feels slightly better than yesterday. No specific complaint.  Objective: Vitals:   04/10/20 0813 04/10/20 1100 04/10/20 1127 04/10/20 1140  BP:  121/74 (!) 145/72 (!) 145/72  Pulse:  (!) 56 64 63  Resp:  $Remo'18 20 14  'ldjgA$ Temp: 98 F (36.7 C) 98.6 F (37 C) 99.1 F (37.3 C) 99.1 F (37.3 C)  TempSrc: Oral Oral Oral Oral  SpO2:  98% 100%   Weight:      Height:        Intake/Output Summary (Last 24 hours) at 04/10/2020 1156 Last data filed at 04/10/2020 1133 Gross per 24 hour  Intake 950 ml  Output 2825 ml  Net -1875 ml   Filed Weights   04/08/20 2104  Weight: 85 kg    Examination:  General exam: Appears calm and comfortable  Respiratory system: Rhonchi bilaterally. Respiratory effort normal. Cardiovascular system: S1 & S2 heard, RRR. No JVD, murmurs, rubs, gallops or clicks. No pedal edema. Gastrointestinal system: Abdomen is nondistended, soft and nontender. No organomegaly or masses felt. Normal bowel sounds heard. Central nervous system: Alert and oriented. No focal neurological deficits. Extremities: Symmetric 5 x 5 power. Skin: No rashes, lesions or ulcers.  Psychiatry: Judgement and insight appear normal. Mood & affect appropriate.   Data Reviewed: I have personally reviewed  following labs and imaging studies  CBC: Recent Labs  Lab 04/08/20 2120 04/08/20 2303 04/09/20 0725 04/10/20 0522  WBC 12.8*  --  13.3* 11.1*  NEUTROABS 11.8*  --   --  9.6*  HGB 11.8* 11.6* 10.6* 11.1*  HCT 37.3* 34.0* 33.0* 34.0*  MCV 94.0  --  95.1 91.9  PLT 285  --  260 725   Basic Metabolic Panel: Recent Labs  Lab 04/08/20 2120 04/08/20 2303 04/09/20 0725 04/10/20 0522  NA 134* 141 135 137  K 3.6 4.0 3.3* 3.1*  CL 111  --  115* 110  CO2 10*  --  11* 15*  GLUCOSE 200*  --  179* 153*  BUN 54*  --  50* 46*  CREATININE 2.58*  --  2.52* 2.39*  CALCIUM 7.9*  --  7.8* 7.8*  MG  --   --   --  1.5*   GFR: Estimated Creatinine Clearance: 24.8 mL/min (A) (by C-G formula based on SCr of 2.39 mg/dL (H)). Liver Function Tests: Recent Labs  Lab 04/08/20 2120 04/09/20 0725  AST 15 16  ALT 17 16  ALKPHOS 70 62  BILITOT 0.5 0.5  PROT 6.7 5.7*  ALBUMIN 2.9* 2.4*   No results for input(s): LIPASE, AMYLASE in the last 168 hours. No results for input(s): AMMONIA in the last 168 hours. Coagulation Profile: Recent Labs  Lab 04/08/20 2153 04/09/20 0725  INR 1.5* 1.5*   Cardiac Enzymes: Recent Labs  Lab 04/08/20 2120  CKTOTAL 143   BNP (last 3 results) No results for input(s): PROBNP in the last 8760 hours. HbA1C: Recent Labs    04/09/20 0725  HGBA1C 6.3*   CBG: Recent Labs  Lab 04/09/20 1126 04/09/20 1718 04/09/20 2126 04/10/20 0754 04/10/20 1129  GLUCAP 169* 156* 135* 172* 162*   Lipid Profile: No results for input(s): CHOL, HDL, LDLCALC, TRIG, CHOLHDL, LDLDIRECT in  the last 72 hours. Thyroid Function Tests: No results for input(s): TSH, T4TOTAL, FREET4, T3FREE, THYROIDAB in the last 72 hours. Anemia Panel: No results for input(s): VITAMINB12, FOLATE, FERRITIN, TIBC, IRON, RETICCTPCT in the last 72 hours. Sepsis Labs: Recent Labs  Lab 04/08/20 2120 04/08/20 2250 04/09/20 0411 04/09/20 0725 04/09/20 0943  PROCALCITON  --   --   --  9.21  --    LATICACIDVEN 2.2* 3.0* 1.0  --  1.6    Recent Results (from the past 240 hour(s))  Respiratory Panel by RT PCR (Flu A&B, Covid) - Nasopharyngeal Swab     Status: None   Collection Time: 04/08/20  9:00 PM   Specimen: Nasopharyngeal Swab  Result Value Ref Range Status   SARS Coronavirus 2 by RT PCR NEGATIVE NEGATIVE Final    Comment: (NOTE) SARS-CoV-2 target nucleic acids are NOT DETECTED.  The SARS-CoV-2 RNA is generally detectable in upper respiratoy specimens during the acute phase of infection. The lowest concentration of SARS-CoV-2 viral copies this assay can detect is 131 copies/mL. A negative result does not preclude SARS-Cov-2 infection and should not be used as the sole basis for treatment or other patient management decisions. A negative result may occur with  improper specimen collection/handling, submission of specimen other than nasopharyngeal swab, presence of viral mutation(s) within the areas targeted by this assay, and inadequate number of viral copies (<131 copies/mL). A negative result must be combined with clinical observations, patient history, and epidemiological information. The expected result is Negative.  Fact Sheet for Patients:  https://www.moore.com/  Fact Sheet for Healthcare Providers:  https://www.young.biz/  This test is no t yet approved or cleared by the [place] FDA and  has been authorized for detection and/or diagnosis of SARS-CoV-2 by FDA under an Emergency Use Authorization (EUA). This EUA will remain  in effect (meaning this test can be used) for the duration of the COVID-19 declaration under Section 564(b)(1) of the Act, 21 U.S.C. section 360bbb-3(b)(1), unless the authorization is terminated or revoked sooner.     Influenza A by PCR NEGATIVE NEGATIVE Final   Influenza B by PCR NEGATIVE NEGATIVE Final    Comment: (NOTE) The Xpert Xpress SARS-CoV-2/FLU/RSV assay is intended as an aid in   the diagnosis of influenza from Nasopharyngeal swab specimens and  should not be used as a sole basis for treatment. Nasal washings and  aspirates are unacceptable for Xpert Xpress SARS-CoV-2/FLU/RSV  testing.  Fact Sheet for Patients: https://www.moore.com/  Fact Sheet for Healthcare Providers: https://www.young.biz/  This test is not yet approved or cleared by the [place] FDA and  has been authorized for detection and/or diagnosis of SARS-CoV-2 by  FDA under an Emergency Use Authorization (EUA). This EUA will remain  in effect (meaning this test can be used) for the duration of the  Covid-19 declaration under Section 564(b)(1) of the Act, 21  U.S.C. section 360bbb-3(b)(1), unless the authorization is  terminated or revoked. Performed at [facility], [address] [place], [place] [address]   Blood Culture (routine x 2)     Status: Abnormal (Preliminary result)   Collection Time: 04/08/20  9:25 PM   Specimen: BLOOD LEFT ARM  Result Value Ref Range Status   Specimen Description BLOOD LEFT ARM  Final   Special Requests   Final    BOTTLES DRAWN AEROBIC AND ANAEROBIC Blood Culture adequate volume   Culture  Setup Time   Final    GRAM POSITIVE COCCI IN CHAINS GRAM NEGATIVE RODS  AEROBIC BOTTLE ONLY CRITICAL RESULT CALLED TO, READ BACK BY AND VERIFIED WITH: PHARMD F WILSON 04/09/20 AT 1724 SK    Culture (A)  Final    ENTEROCOCCUS FAECALIS PSEUDOMONAS AERUGINOSA SUSCEPTIBILITIES TO FOLLOW Performed at [facility], [place] [address] [place], [place] [address]    Report Status PENDING  Incomplete  Urine culture     Status: Abnormal   Collection Time: 04/08/20  9:25 PM   Specimen: Urine, Clean Catch  Result Value Ref Range Status   Specimen Description URINE, CLEAN CATCH  Final   Special Requests   Final    NONE Performed at [facility], [place] [address] [place], [place] [address]    Culture MULTIPLE SPECIES  PRESENT, SUGGEST RECOLLECTION (A)  Final   Report Status 04/10/2020 FINAL  Final  Blood Culture ID Panel (Reflexed)     Status: Abnormal   Collection Time: 04/08/20  9:25 PM  Result Value Ref Range Status   Enterococcus faecalis DETECTED (A) NOT DETECTED Final    Comment: CRITICAL RESULT CALLED TO, READ BACK BY AND VERIFIED WITH: PHARMD F WILSON 04/09/20 AT 1724 SK    Enterococcus Faecium NOT DETECTED NOT DETECTED Final   Listeria monocytogenes NOT DETECTED NOT DETECTED Final   Staphylococcus species NOT DETECTED NOT DETECTED Final   Staphylococcus aureus (BCID) NOT DETECTED NOT DETECTED Final   Staphylococcus epidermidis NOT DETECTED NOT DETECTED Final   Staphylococcus lugdunensis NOT DETECTED NOT DETECTED Final   Streptococcus species NOT DETECTED NOT DETECTED Final   Streptococcus agalactiae NOT DETECTED NOT DETECTED Final   Streptococcus pneumoniae NOT DETECTED NOT DETECTED Final   Streptococcus pyogenes NOT DETECTED NOT DETECTED Final   A.calcoaceticus-baumannii NOT DETECTED NOT DETECTED Final   Bacteroides fragilis NOT DETECTED NOT DETECTED Final   Enterobacterales NOT DETECTED NOT DETECTED Final   Enterobacter cloacae complex NOT DETECTED NOT DETECTED Final   Escherichia coli NOT DETECTED NOT DETECTED Final   Klebsiella aerogenes NOT DETECTED NOT DETECTED Final   Klebsiella oxytoca NOT DETECTED NOT DETECTED Final   Klebsiella pneumoniae NOT DETECTED NOT DETECTED Final   Proteus species NOT DETECTED NOT DETECTED Final   Salmonella species NOT DETECTED NOT DETECTED Final   Serratia marcescens NOT DETECTED NOT DETECTED Final   Haemophilus influenzae NOT DETECTED NOT DETECTED Final   Neisseria meningitidis NOT DETECTED NOT DETECTED Final   Pseudomonas aeruginosa DETECTED (A) NOT DETECTED Final    Comment: CRITICAL RESULT CALLED TO, READ BACK BY AND VERIFIED WITH: PHARMD F WILSON 04/09/20 AT 1724 SK    Stenotrophomonas maltophilia NOT DETECTED NOT DETECTED Final   Candida  albicans NOT DETECTED NOT DETECTED Final   Candida auris NOT DETECTED NOT DETECTED Final   Candida glabrata NOT DETECTED NOT DETECTED Final   Candida krusei NOT DETECTED NOT DETECTED Final   Candida parapsilosis NOT DETECTED NOT DETECTED Final   Candida tropicalis NOT DETECTED NOT DETECTED Final   Cryptococcus neoformans/gattii NOT DETECTED NOT DETECTED Final   CTX-M ESBL NOT DETECTED NOT DETECTED Final   Carbapenem resistance IMP NOT DETECTED NOT DETECTED Final   Carbapenem resistance KPC NOT DETECTED NOT DETECTED Final   Carbapenem resistance NDM NOT DETECTED NOT DETECTED Final   Vancomycin resistance NOT DETECTED NOT DETECTED Final   Carbapenem resistance VIM NOT DETECTED NOT DETECTED Final    Comment: Performed at [facility], [address], [place], [place] [address]  Blood Culture (routine x 2)     Status: None (Preliminary result)  Collection Time: 04/08/20  9:34 PM   Specimen: BLOOD RIGHT HAND  Result Value Ref Range Status   Specimen Description BLOOD RIGHT HAND  Final   Special Requests   Final    BOTTLES DRAWN AEROBIC AND ANAEROBIC Blood Culture results may not be optimal due to an inadequate volume of blood received in culture bottles   Culture   Final    NO GROWTH 2 DAYS Performed at [facility], [place] [address], [place], [place] [address]    Report Status PENDING  Incomplete      Radiology Studies: CT Head Wo Contrast  Result Date: 04/08/2020 CLINICAL DATA:  Fall yesterday.  Fever and confusion today. EXAM: CT HEAD WITHOUT CONTRAST TECHNIQUE: Contiguous axial images were obtained from the base of the skull through the vertex without intravenous contrast. COMPARISON:  CT head 10/12/2017.  MRI brain 10/14/2017 FINDINGS: Brain: No evidence of acute infarction, hemorrhage, hydrocephalus, extra-axial collection or mass lesion/mass effect. Diffuse cerebral atrophy. Mild ventricular dilatation consistent with central atrophy. Low-attenuation changes in the deep  white matter consistent small vessel ischemia. Parenchymal calcifications in the basal ganglia and cerebellum. Vascular: Intracranial arterial vascular calcifications are present. Skull: Depression and deformity of the outer table of the right frontal sinus, new since previous study. This is consistent with a depression fracture. No significant overlying soft tissue swelling suggests this to represent an old rather than acute fracture. Calvarium appears otherwise intact. Sinuses/Orbits: Paranasal sinuses and mastoid air cells are clear. Other: None. IMPRESSION: 1. No acute intracranial abnormalities. 2. Chronic atrophy and small vessel ischemic changes. 3. Depressed fracture deformity of the outer table of the right frontal sinus. No significant overlying soft tissue swelling and lack of fluid in the sinus suggests this to represent an old rather than acute fracture. Electronically Signed   By: Lucienne Capers M.D.   On: 04/08/2020 23:33   DG Chest Port 1 View  Result Date: 04/08/2020 CLINICAL DATA:  Question sepsis.  Fever today, confusion. EXAM: PORTABLE CHEST 1 VIEW COMPARISON:  10/12/2017 FINDINGS: The cardiomediastinal contours are normal. Normal heart size. Aortic atherosclerosis. Subsegmental opacities at the left lung base. Pulmonary vasculature is normal. No pleural effusion or pneumothorax. No acute osseous abnormalities are seen. The bones are under mineralized. There is likely interposition of colon under the right hemidiaphragm. IMPRESSION: Subsegmental opacities at the left lung base, favor atelectasis or scarring. Airspace disease such as pneumonia is also considered in the setting of fever. Electronically Signed   By: Keith Rake M.D.   On: 04/08/2020 21:53    Scheduled Meds: . ferrous sulfate  325 mg Oral Q breakfast  . heparin  5,000 Units Subcutaneous Q8H  . insulin aspart  0-5 Units Subcutaneous QHS  . insulin aspart  0-9 Units Subcutaneous TID WC  . insulin glargine  5 Units  Subcutaneous QHS  . loratadine  10 mg Oral Daily  . omega-3 acid ethyl esters  1 g Oral BID  . potassium chloride  40 mEq Oral Q4H  . pravastatin  20 mg Oral Daily   Continuous Infusions: . ampicillin (OMNIPEN) IV Stopped (04/10/20 0631)  . ceFEPime (MAXIPIME) IV    . sodium bicarbonate (isotonic) 150 mEq in D5W 1000 mL infusion 50 mL/hr at 04/09/20 1706     LOS: 1 day   Time spent: 31 minutes   Darliss Cheney, MD [facility]  04/10/2020, 11:56 AM   To contact the attending provider between 7A-7P or the covering provider during after hours 7P-7A,  please log into the web site [email or web address].

## 2020-04-10 NOTE — Progress Notes (Signed)
  PHARMACY - PHYSICIAN COMMUNICATION CRITICAL VALUE ALERT - BLOOD CULTURE IDENTIFICATION (BCID)  New results for GPR in anaerobic bottle. In addition to aerobic bottle containing e.faecalis and pseudomonas.   No changes to current antibiotic regimen. Infectious disease is following.   Erin Hearing PharmD., BCPS Clinical Pharmacist 04/10/2020 5:14 PM

## 2020-04-10 NOTE — Consult Note (Signed)
Infectious Disease Consult Note  Date: 04/10/2020               Patient Name:  Shane [name] MRN: [id number]  DOB: [birth year] Age / Sex: 84 y.o., male   PCP: Tammi Sou, MD         Requesting Physician: Dr. Darliss Cheney, MD    Consulting Reason:  Positive Blood Cultures - Enterococcus faecalis     Assessment, Plan, & Recommendations by Problem:  Positive Blood Cultures  Patient with 1/4 blood culture vials positive for both Enterococcus faecalis and Pseudomonas aeruginosa. Other three vials negative with no growth at this time (48 hours). Patient initially started on Ceftriaxone and Azithromycin and switched to Ampicillin and Cefepime once cultures resulted.     Patient denies any fever, chills, shortness of breath, or continuous cough. His vitals have stabilized with IV fluids and his highest reordered temperature since admission has been 99.5 F (37.5 C) X-ray impression by radiologist revealed subsegmental opacity left lung base, atelectasis vs scarring, also consider pneumonia. However, have low clinical suspicion for pneumonia due to patient being asymptomatic at this time, having unremarkable pulmonary examination, and minimal evidence on chest x-ray.  Patient stated urostomy with recent darkening of urine, denies foul smell being present. Low suspicion for this being the source of positive blood cultures. While possible bacterial source is urostomy bag and patient being post operative cystectomy he will not have any urinary symptoms such as dysuria, patient's urine clear at this time. Patient has no CVA tenderness that would be indicative of pyelonephritis.   Suspicion for contamination at this time as only one vial grew cultures and the one vial grew two different species that are not seen in the other 3 samples. Also reasons above and my clinical examination; patient with unremarkable vitals and examination. Low suspicion that patient has bacteremia at this time. Will  continue to monitor cultures and continue antibiotics. If patient is truly bacteremic, suspect source may be urostomy. Will consider discontinuing antibiotics if patient has no growth on cultures by tomorrow.Murmur noted on prior cardiology documentation, do no suspect new murmur from endocarditis. Last echo 04/05/19, thickened and calcified aortic valve. If other blood cultures do grow Enterococcus faecalis will also consider TTE and cards consult.   Recommendations:  - Continue antibiotics  Ampicillin 2g IV Q8H, Day 2  Cefepime 2g IV daily, Day 1 - Consider discontinuing antibiotics tomorrow if no other growth seen on blood cultures.  - If other cultures positive for Enterococcus faecalis, will order TTE and consult cards.  - Continue to monitor for any changes that would be indicative of worsening infection.  Lactic Acidosis Leukocytosis Hypotension AKI  Patient found to have initial lactic acidosis of 2.2, WBC of 12.8 and patient was hypotensive. He was also found to have a Cr of 2.58, BUN of 54. While this does make me suspicious for sepsis, patient has improved with IV fluids. His lactic acid has normalized to 1.6 and his WBC decreased to 11.1. His BUN to Cr ratio is 20.9, pre-renal in nature and has also improved with IV fluids. Suspect above is due to dehydration, patient's numbers continue to improve with continued hydration. Patient has received total of 3L of LR since admission.  Recommendations:  - Continue IV fluid therapy and encourage patient to continue drinking PO fluids.  - Continue trending white blood cells, creatinine, and BUN.   Principal Problem:   Severe sepsis [facility]) Active Problems:   Hypertension   Diabetes  mellitus without complication ([place])   UTI (urinary tract infection)   Malignant neoplasm of urinary bladder ([facility])   S/P ileal conduit ([facility])   AKI (acute kidney injury) ([place])   Lobar pneumonia ([place])  History of Present Illness:   Shane [name] is an 84 y/o  M with a pertinent PMHx of malignant neoplasm of bladder p/o cystectomy, CKD stage III, CVA, CAD, HTN, and T2DM who presents to the ED with a fall. He notes he was walking to his kitchen with the assistance of his walker when he noticed he was leaning to one side over the other. The patient is unable to recall which side he favored. He noted he then fell to the ground and was unable to get him self back up due to weakness. He lives with his wife, son, daughter-in-law, and grandchildren, he yelled for help and was able to get there attention 2 hours or so after falling. He denies feeling dizzy, weak, or lightheaded prior to the fall. He denies losing consciousness, hitting his head, or any injury from the fall. He notes similar episode of favoring one side of his body a few weeks ago, but denies falling or injury from this episode.  He notes over the past 2 weeks his urine has been darker than normal in his urine bag, he denies foul smelling urine. He also reports a productive cough with clear phlegm that occurs only right upon waking up. He suspects it is due to increasing nasal drainage that has occurred over the past few weeks. He denies any other cough throughout the day.   He denies recent illness, fever, chills, shortness of breath, chest pain, back pain, abdominal pain, nausea, vomiting, diarrhea, or loss of appetite.    He states he feels well and has no other complaints at this time  Upon reexamination with my attending Dr. Linus Salmons, the patient did report having a single episode of chills while in the ED and also noted feeling weak.   Review of Systems: Pertinent items are noted in HPI.  Physical Exam: Blood pressure 118/69, pulse (!) 59, temperature 98 F (36.7 C), temperature source Oral, resp. rate 16, height 5\' 7"  (1.702 m), weight 85 kg, SpO2 95 %.  Physical Exam Vitals and nursing note reviewed.  Constitutional:      General: He is not in acute distress.    Appearance: Normal  appearance. He is normal weight. He is not ill-appearing or toxic-appearing.  HENT:     Head: Normocephalic and atraumatic.     Mouth/Throat:     Pharynx: Oropharynx is clear.  Eyes:     Pupils: Pupils are equal, round, and reactive to light.  Cardiovascular:     Rate and Rhythm: Normal rate and regular rhythm.     Heart sounds: Murmur (Systolic Murmur) heard.   Pulmonary:     Effort: Pulmonary effort is normal. No respiratory distress.     Breath sounds: Normal breath sounds. No stridor. No wheezing, rhonchi or rales.  Chest:     Chest wall: No tenderness.  Abdominal:     General: Abdomen is flat. There is no distension.     Palpations: Abdomen is soft.     Tenderness: There is no abdominal tenderness. There is no right CVA tenderness, left CVA tenderness, guarding or rebound.     Comments: Stoma was pink/moist. No erythema, tenderness surrounding area.   Genitourinary:    Comments: Urine was clear/light yellow in appearance, approximately 200 cc's in his urine bag.  Musculoskeletal:        General: Normal range of motion.     Cervical back: Normal range of motion and neck supple. No tenderness.  Lymphadenopathy:     Cervical: No cervical adenopathy.  Skin:    General: Skin is warm and dry.  Neurological:     General: No focal deficit present.     Mental Status: He is alert and oriented to person, place, and time.  Psychiatric:        Mood and Affect: Mood normal.        Behavior: Behavior normal.    Imaging results:  CT Head Wo Contrast  Result Date: 04/08/2020 CLINICAL DATA:  Fall yesterday.  Fever and confusion today. EXAM: CT HEAD WITHOUT CONTRAST TECHNIQUE: Contiguous axial images were obtained from the base of the skull through the vertex without intravenous contrast. COMPARISON:  CT head 10/12/2017.  MRI brain 10/14/2017 FINDINGS: Brain: No evidence of acute infarction, hemorrhage, hydrocephalus, extra-axial collection or mass lesion/mass effect. Diffuse cerebral  atrophy. Mild ventricular dilatation consistent with central atrophy. Low-attenuation changes in the deep white matter consistent small vessel ischemia. Parenchymal calcifications in the basal ganglia and cerebellum. Vascular: Intracranial arterial vascular calcifications are present. Skull: Depression and deformity of the outer table of the right frontal sinus, new since previous study. This is consistent with a depression fracture. No significant overlying soft tissue swelling suggests this to represent an old rather than acute fracture. Calvarium appears otherwise intact. Sinuses/Orbits: Paranasal sinuses and mastoid air cells are clear. Other: None. IMPRESSION: 1. No acute intracranial abnormalities. 2. Chronic atrophy and small vessel ischemic changes. 3. Depressed fracture deformity of the outer table of the right frontal sinus. No significant overlying soft tissue swelling and lack of fluid in the sinus suggests this to represent an old rather than acute fracture. Electronically Signed   By: Lucienne Capers M.D.   On: 04/08/2020 23:33   DG Chest Port 1 View  Result Date: 04/08/2020 CLINICAL DATA:  Question sepsis.  Fever today, confusion. EXAM: PORTABLE CHEST 1 VIEW COMPARISON:  10/12/2017 FINDINGS: The cardiomediastinal contours are normal. Normal heart size. Aortic atherosclerosis. Subsegmental opacities at the left lung base. Pulmonary vasculature is normal. No pleural effusion or pneumothorax. No acute osseous abnormalities are seen. The bones are under mineralized. There is likely interposition of colon under the right hemidiaphragm. IMPRESSION: Subsegmental opacities at the left lung base, favor atelectasis or scarring. Airspace disease such as pneumonia is also considered in the setting of fever. Electronically Signed   By: Keith Rake M.D.   On: 04/08/2020 21:53   Echocardiogram 04/05/19 IMPRESSIONS  1. Left ventricular ejection fraction, by visual estimation, is 65 to  70%. The left  ventricle has hyperdynamic function. Normal left ventricular  size. There is mildly increased left ventricular hypertrophy.  2. Left ventricular diastolic Doppler parameters are consistent with  impaired relaxation pattern of LV diastolic filling.  3. Global right ventricle has normal systolic function.The right  ventricular size is normal. No increase in right ventricular wall  thickness.  4. Left atrial size was mildly dilated.  5. Right atrial size was normal.  6. Moderate calcification of the mitral valve leaflet(s).  7. Moderate thickening of the mitral valve leaflet(s).  8. The mitral valve is normal in structure. Mild mitral valve  regurgitation. No evidence of mitral stenosis.  9. The tricuspid valve is normal in structure. Tricuspid valve  regurgitation is mild.  10. Aortic valve mean gradient measures 17.0 mmHg.  11. The  aortic valve in severely thickened and calcified. Aortic valve  regurgitation was not visualized by color flow Doppler. Moderate aortic  valve stenosis.  12. The pulmonic valve was normal in structure. Pulmonic valve  regurgitation is not visualized by color flow Doppler.  13. Normal pulmonary artery systolic pressure.  14. The inferior vena cava is normal in size with greater than 50%  respiratory variability, suggesting right atrial pressure of 3 mmHg.   Signed: Riesa Pope, MD 04/10/2020, 10:36 AM

## 2020-04-10 NOTE — ED Notes (Signed)
Got patient some coffee

## 2020-04-10 NOTE — ED Notes (Signed)
RN has called floor, they state they cannot have 6 pt;s and they they cannot take non- covid

## 2020-04-11 ENCOUNTER — Inpatient Hospital Stay (HOSPITAL_COMMUNITY): Payer: Medicare Other

## 2020-04-11 LAB — CBC WITH DIFFERENTIAL/PLATELET
Abs Immature Granulocytes: 0.06 10*3/uL (ref 0.00–0.07)
Basophils Absolute: 0 10*3/uL (ref 0.0–0.1)
Basophils Relative: 0 %
Eosinophils Absolute: 0.1 10*3/uL (ref 0.0–0.5)
Eosinophils Relative: 1 %
HCT: 32.3 % — ABNORMAL LOW (ref 39.0–52.0)
Hemoglobin: 10.7 g/dL — ABNORMAL LOW (ref 13.0–17.0)
Immature Granulocytes: 1 %
Lymphocytes Relative: 6 %
Lymphs Abs: 0.7 10*3/uL (ref 0.7–4.0)
MCH: 29.7 pg (ref 26.0–34.0)
MCHC: 33.1 g/dL (ref 30.0–36.0)
MCV: 89.7 fL (ref 80.0–100.0)
Monocytes Absolute: 0.5 10*3/uL (ref 0.1–1.0)
Monocytes Relative: 4 %
Neutro Abs: 10.1 10*3/uL — ABNORMAL HIGH (ref 1.7–7.7)
Neutrophils Relative %: 88 %
Platelets: 259 10*3/uL (ref 150–400)
RBC: 3.6 MIL/uL — ABNORMAL LOW (ref 4.22–5.81)
RDW: 14.7 % (ref 11.5–15.5)
WBC: 11.4 10*3/uL — ABNORMAL HIGH (ref 4.0–10.5)
nRBC: 0 % (ref 0.0–0.2)

## 2020-04-11 LAB — GLUCOSE, CAPILLARY
Glucose-Capillary: 136 mg/dL — ABNORMAL HIGH (ref 70–99)
Glucose-Capillary: 132 mg/dL — ABNORMAL HIGH (ref 70–99)
Glucose-Capillary: 158 mg/dL — ABNORMAL HIGH (ref 70–99)
Glucose-Capillary: 134 mg/dL — ABNORMAL HIGH (ref 70–99)

## 2020-04-11 LAB — BASIC METABOLIC PANEL
Anion gap: 12 (ref 5–15)
BUN: 42 mg/dL — ABNORMAL HIGH (ref 8–23)
CO2: 16 mmol/L — ABNORMAL LOW (ref 22–32)
Calcium: 7.8 mg/dL — ABNORMAL LOW (ref 8.9–10.3)
Chloride: 107 mmol/L (ref 98–111)
Creatinine, Ser: 2.36 mg/dL — ABNORMAL HIGH (ref 0.61–1.24)
GFR calc Af Amer: 29 mL/min — ABNORMAL LOW (ref >60)
GFR calc non Af Amer: 25 mL/min — ABNORMAL LOW (ref >60)
Glucose, Bld: 125 mg/dL — ABNORMAL HIGH (ref 70–99)
Potassium: 4.1 mmol/L (ref 3.5–5.1)
Sodium: 135 mmol/L (ref 135–145)

## 2020-04-11 LAB — MAGNESIUM: Magnesium: 1.9 mg/dL (ref 1.7–2.4)

## 2020-04-11 MED ORDER — ADULT MULTIVITAMIN W/MINERALS CH
1.0000 | ORAL_TABLET | Freq: Every day | ORAL | Status: DC
  Administered 2020-04-11 – 2020-04-17 (×6): 1 via ORAL
  Filled 2020-04-11 (×7): qty 1

## 2020-04-11 MED ORDER — SODIUM CHLORIDE 0.9 % IV SOLN: INTRAVENOUS | Status: DC

## 2020-04-11 MED ORDER — PIPERACILLIN-TAZOBACTAM 3.375 G IVPB
3.3750 g | Freq: Three times a day (TID) | INTRAVENOUS | Status: DC
  Administered 2020-04-11 – 2020-04-17 (×18): 3.375 g via INTRAVENOUS
  Filled 2020-04-11 (×17): qty 50

## 2020-04-11 MED ORDER — GLUCERNA SHAKE PO LIQD
237.0000 mL | Freq: Three times a day (TID) | ORAL | Status: DC
  Administered 2020-04-11 – 2020-04-17 (×14): 237 mL via ORAL

## 2020-04-11 NOTE — Progress Notes (Signed)
PROGRESS NOTE    Shane [name]  XBM:[id number] DOB: 1935/08/29 DOA: 04/08/2020 PCP: Tammi Sou, MD   Brief Narrative:  HPI: Shane [name] is a 84 y.o. male with medical history significant of bladder cancer status post Ileal conduit, calcific pancreatitis, chronic kidney disease stage III, coronary artery disease, aortic stenosis hypertension, hyperlipidemia, recurring UTIs, recurrent GI bleeds, history of CVA, diabetes, duodenal ulcer who presented to the ER with recurrent falls fever and weakness.  Also altered mental status.Patient apparently was trying to go to the bathroom yesterday when he had a fall.  He feels so weak.  He will had COVID-19 in January which he recovered from but has had 3 other episodes of pneumonia since then he has continued to be weak and debilitated.  His weakness has gotten worse in the last 2 to 3 days.  He came to the ER where he was found to have have SIRS criteria.  Urinalysis and x-rays showed possible UTI and pneumonia.  At this point he appears to have sepsis secondary to UTI and possible pneumonia.  He additionally has features of endorgan damage making him have severe sepsis.  He is being admitted to the hospital for further evaluation.  He denies any hematemesis.  No melena no bright red blood per rectum..  ED Course: Temperature 100.9 blood pressure 84/41 pulse 97 respirate 25 oxygen sat 96% room air.  Venous pH is 7.196.  Sodium 132 potassium 3.6 chloride 111 CO2 obtain glucose 200 BUN 54 creatinine 2.58 and calcium 7.9.  CK is 143 lactic acid 3.0 white count 12.8 hemoglobin 11.8.  Platelets 285.  PT 17.2 INR 1.5.  Urinalysis showed cloudy urine with many bacteria WBC more than 50 RBC 6-10 large leukocytes.  Chest x-ray showed subsegmental opacities at the left lung base probably atelectasis or scarring possible pneumonia.  Head CT without contrast showed no acute findings.  Patient being admitted with severe sepsis secondary to UTI and possible  pneumonia  Assessment & Plan:   Principal Problem:   Severe sepsis ([place]) Active Problems:   Hypertension   Diabetes mellitus without complication ([place])   UTI (urinary tract infection)   Malignant neoplasm of urinary bladder ([facility])   S/P ileal conduit ([facility])   AKI (acute kidney injury) ([place])   Lobar pneumonia ([facility])   #1 severe sepsis secondary to UTI/community-acquired pneumonia/gram-negative bacteremia: Patient met severe sepsis criteria based on fever, leukocytosis and tachypnea and endorgan damage of acute renal failure and lactic acid of 3.  Continues to feel well.  Blood culture growing Enterococcus faecalis and Pseudomonas aeruginosa. Antibiotics were switched to ampicillin and cefepime. ID also consulted.  Will defer to ID for further recommendations.  #2 acute kidney injury/dehydration: Secondary to sepsis/ATN. Creatinine only slightly improved again today but not as much as I had expected.  He is on bicarb drip but only 50 cc/h.  We will also add normal saline at 75 cc/h.  He clinically looks dehydrated.  Does not eat or drink much.  We will also obtain ultrasound renal.  #3  Non-anion gap metabolic acidosis: Presented with bicarb of 10.  Now 16.  Continue bicarb drip.  Monitor daily.  This is likely due to sepsis and AKI.  #4.  Hypokalemia: Resolved  #6 history of urinary bladder cancer: Patient has had surgery on treatment.  Defer to his oncologist.  #7 diabetes type II: Continue with sliding scale insulin and supportive care.  #8 hypertension: Controlled.  Patient takes only lisinopril at home  which is on hold due to AKI.  #9 hypomagnesemia: Resolved  #10: Deconditioning: PT OT consulted  DVT prophylaxis: heparin injection 5,000 Units Start: 04/09/20 0715   Code Status: Full Code  Family Communication: Daughter present at bedside.  Plan of care discussed with patient in length with patient and he verbalized understanding and agreed with it.  Also answered several  questions by daughter.  Status is: Inpatient  Remains inpatient appropriate because:Inpatient level of care appropriate due to severity of illness   Dispo: The patient is from: Home              Anticipated d/c is to: Home              Anticipated d/c date is: 2 days              Patient currently is not medically stable to d/c.        Estimated body mass index is 29.35 kg/m as calculated from the following:   Height as of this encounter: $RemoveBeforeD'5\' 7"'[id number]$  (1.702 m).   Weight as of this encounter: 85 kg.      Nutritional status:               Consultants:   ID  Procedures:   None  Antimicrobials:  Anti-infectives (From admission, onward)   Start     Dose/Rate Route Frequency Ordered Stop   04/10/20 2000  ceFEPIme (MAXIPIME) 2 g in sodium chloride 0.9 % 100 mL IVPB        2 g 200 mL/hr over 30 Minutes Intravenous Every 24 hours 04/09/20 1806     04/09/20 2130  ceFEPIme (MAXIPIME) 2 g in sodium chloride 0.9 % 100 mL IVPB  Status:  Discontinued        2 g 200 mL/hr over 30 Minutes Intravenous Every 24 hours 04/08/20 2207 04/09/20 0957   04/09/20 2000  ampicillin (OMNIPEN) 2 g in sodium chloride 0.9 % 100 mL IVPB        2 g 300 mL/hr over 20 Minutes Intravenous Every 8 hours 04/09/20 1806     04/09/20 1900  ceFEPIme (MAXIPIME) 2 g in sodium chloride 0.9 % 100 mL IVPB        2 g 200 mL/hr over 30 Minutes Intravenous  Once 04/09/20 1806 04/09/20 2030   04/09/20 0715  cefTRIAXone (ROCEPHIN) 2 g in sodium chloride 0.9 % 100 mL IVPB  Status:  Discontinued        2 g 200 mL/hr over 30 Minutes Intravenous Every 24 hours 04/09/20 0711 04/09/20 1804   04/09/20 0715  azithromycin (ZITHROMAX) 500 mg in sodium chloride 0.9 % 250 mL IVPB  Status:  Discontinued        500 mg 250 mL/hr over 60 Minutes Intravenous Every 24 hours 04/09/20 0711 04/09/20 1804   04/08/20 2207  vancomycin variable dose per unstable renal function (pharmacist dosing)  Status:  Discontinued         Does  not apply See admin instructions 04/08/20 2207 04/09/20 0956   04/08/20 2130  ceFEPIme (MAXIPIME) 2 g in sodium chloride 0.9 % 100 mL IVPB        2 g 200 mL/hr over 30 Minutes Intravenous  Once 04/08/20 2119 04/08/20 2213   04/08/20 2130  metroNIDAZOLE (FLAGYL) IVPB 500 mg        500 mg 100 mL/hr over 60 Minutes Intravenous  Once 04/08/20 2119 04/08/20 2231   04/08/20 2130  vancomycin (VANCOCIN) IVPB 1000 mg/200  mL premix  Status:  Discontinued        1,000 mg 200 mL/hr over 60 Minutes Intravenous  Once 04/08/20 2119 04/08/20 2126   04/08/20 2130  vancomycin (VANCOREADY) IVPB 1750 mg/350 mL        1,750 mg 175 mL/hr over 120 Minutes Intravenous  Once 04/08/20 2126 04/08/20 2351         Subjective: Seen and examined.  Continues to feel well.  Daughter at the bedside.  He has no specific complaint.  Objective: Vitals:   04/10/20 1140 04/10/20 1800 04/10/20 1936 04/11/20 0732  BP: (!) 145/72 121/67 116/73 123/84  Pulse: 63 80 75 71  Resp: $Remo'14 20 18 17  'qJiql$ Temp: 99.1 F (37.3 C) 98.8 F (37.1 C) 97.9 F (36.6 C) 99.4 F (37.4 C)  TempSrc: Oral Oral Oral   SpO2:  94% 94% 95%  Weight:      Height:        Intake/Output Summary (Last 24 hours) at 04/11/2020 1105 Last data filed at 04/11/2020 0915 Gross per 24 hour  Intake 482.36 ml  Output 3550 ml  Net -3067.64 ml   Filed Weights   04/08/20 2104  Weight: 85 kg    Examination:  General exam: Appears calm and comfortable  Respiratory system: Rhonchi bilaterally. Respiratory effort normal. Cardiovascular system: S1 & S2 heard, RRR. No JVD, murmurs, rubs, gallops or clicks. No pedal edema. Gastrointestinal system: Abdomen is nondistended, soft and nontender. No organomegaly or masses felt. Normal bowel sounds heard. Central nervous system: Alert and oriented. No focal neurological deficits. Extremities: Symmetric 5 x 5 power. Skin: No rashes, lesions or ulcers.  Psychiatry: Judgement and insight appear normal. Mood & affect  appropriate.   Data Reviewed: I have personally reviewed following labs and imaging studies  CBC: Recent Labs  Lab 04/08/20 2120 04/08/20 2303 04/09/20 0725 04/10/20 0522 04/11/20 0054  WBC 12.8*  --  13.3* 11.1* 11.4*  NEUTROABS 11.8*  --   --  9.6* 10.1*  HGB 11.8* 11.6* 10.6* 11.1* 10.7*  HCT 37.3* 34.0* 33.0* 34.0* 32.3*  MCV 94.0  --  95.1 91.9 89.7  PLT 285  --  260 234 505   Basic Metabolic Panel: Recent Labs  Lab 04/08/20 2120 04/08/20 2303 04/09/20 0725 04/10/20 0522 04/11/20 0054  NA 134* 141 135 137 135  K 3.6 4.0 3.3* 3.1* 4.1  CL 111  --  115* 110 107  CO2 10*  --  11* 15* 16*  GLUCOSE 200*  --  179* 153* 125*  BUN 54*  --  50* 46* 42*  CREATININE 2.58*  --  2.52* 2.39* 2.36*  CALCIUM 7.9*  --  7.8* 7.8* 7.8*  MG  --   --   --  1.5* 1.9   GFR: Estimated Creatinine Clearance: 25.2 mL/min (A) (by C-G formula based on SCr of 2.36 mg/dL (H)). Liver Function Tests: Recent Labs  Lab 04/08/20 2120 04/09/20 0725  AST 15 16  ALT 17 16  ALKPHOS 70 62  BILITOT 0.5 0.5  PROT 6.7 5.7*  ALBUMIN 2.9* 2.4*   No results for input(s): LIPASE, AMYLASE in the last 168 hours. No results for input(s): AMMONIA in the last 168 hours. Coagulation Profile: Recent Labs  Lab 04/08/20 2153 04/09/20 0725  INR 1.5* 1.5*   Cardiac Enzymes: Recent Labs  Lab 04/08/20 2120  CKTOTAL 143   BNP (last 3 results) No results for input(s): PROBNP in the last 8760 hours. HbA1C: Recent Labs  04/09/20 0725  HGBA1C 6.3*   CBG: Recent Labs  Lab 04/10/20 0754 04/10/20 1129 04/10/20 1754 04/10/20 1940 04/11/20 0734  GLUCAP 172* 162* 126* 129* 136*   Lipid Profile: No results for input(s): CHOL, HDL, LDLCALC, TRIG, CHOLHDL, LDLDIRECT in the last 72 hours. Thyroid Function Tests: No results for input(s): TSH, T4TOTAL, FREET4, T3FREE, THYROIDAB in the last 72 hours. Anemia Panel: No results for input(s): VITAMINB12, FOLATE, FERRITIN, TIBC, IRON, RETICCTPCT in the  last 72 hours. Sepsis Labs: Recent Labs  Lab 04/08/20 2120 04/08/20 2250 04/09/20 0411 04/09/20 0725 04/09/20 0943  PROCALCITON  --   --   --  9.21  --   LATICACIDVEN 2.2* 3.0* 1.0  --  1.6    Recent Results (from the past 240 hour(s))  Respiratory Panel by RT PCR (Flu A&B, Covid) - Nasopharyngeal Swab     Status: None   Collection Time: 04/08/20  9:00 PM   Specimen: Nasopharyngeal Swab  Result Value Ref Range Status   SARS Coronavirus 2 by RT PCR NEGATIVE NEGATIVE Final    Comment: (NOTE) SARS-CoV-2 target nucleic acids are NOT DETECTED.  The SARS-CoV-2 RNA is generally detectable in upper respiratoy specimens during the acute phase of infection. The lowest concentration of SARS-CoV-2 viral copies this assay can detect is 131 copies/mL. A negative result does not preclude SARS-Cov-2 infection and should not be used as the sole basis for treatment or other patient management decisions. A negative result may occur with  improper specimen collection/handling, submission of specimen other than nasopharyngeal swab, presence of viral mutation(s) within the areas targeted by this assay, and inadequate number of viral copies (<131 copies/mL). A negative result must be combined with clinical observations, patient history, and epidemiological information. The expected result is Negative.  Fact Sheet for Patients:  PinkCheek.be  Fact Sheet for Healthcare Providers:  GravelBags.it  This test is no t yet approved or cleared by the [place] FDA and  has been authorized for detection and/or diagnosis of SARS-CoV-2 by FDA under an Emergency Use Authorization (EUA). This EUA will remain  in effect (meaning this test can be used) for the duration of the COVID-19 declaration under Section 564(b)(1) of the Act, 21 U.S.C. section 360bbb-3(b)(1), unless the authorization is terminated or revoked sooner.     Influenza A by PCR  NEGATIVE NEGATIVE Final   Influenza B by PCR NEGATIVE NEGATIVE Final    Comment: (NOTE) The Xpert Xpress SARS-CoV-2/FLU/RSV assay is intended as an aid in  the diagnosis of influenza from Nasopharyngeal swab specimens and  should not be used as a sole basis for treatment. Nasal washings and  aspirates are unacceptable for Xpert Xpress SARS-CoV-2/FLU/RSV  testing.  Fact Sheet for Patients: PinkCheek.be  Fact Sheet for Healthcare Providers: GravelBags.it  This test is not yet approved or cleared by the [place] FDA and  has been authorized for detection and/or diagnosis of SARS-CoV-2 by  FDA under an Emergency Use Authorization (EUA). This EUA will remain  in effect (meaning this test can be used) for the duration of the  Covid-19 declaration under Section 564(b)(1) of the Act, 21  U.S.C. section 360bbb-3(b)(1), unless the authorization is  terminated or revoked. Performed at [facility], [place] [address], [place], [place] [address]   Blood Culture (routine x 2)     Status: Abnormal (Preliminary result)   Collection Time: 04/08/20  9:25 PM   Specimen: BLOOD LEFT ARM  Result Value Ref Range Status   Specimen Description  BLOOD LEFT ARM  Final   Special Requests   Final    BOTTLES DRAWN AEROBIC AND ANAEROBIC Blood Culture adequate volume   Culture  Setup Time   Final    GRAM POSITIVE COCCI IN CHAINS GRAM NEGATIVE RODS AEROBIC BOTTLE ONLY CRITICAL RESULT CALLED TO, READ BACK BY AND VERIFIED WITH: PHARMD F WILSON 04/09/20 AT 74 SK GRAM POSITIVE RODS GRAM NEGATIVE RODS ANAEROBIC BOTTLE ONLY CRITICAL RESULT CALLED TO, READ BACK BY AND VERIFIED WITH: Tillman Sers PHARMD 1700 04/10/20 A BROWNING    Culture (A)  Final    ENTEROCOCCUS FAECALIS PSEUDOMONAS AERUGINOSA REPEATING SUSCEPTIBILITY FOR ENTEROCOCCUS FAECALIS CULTURE REINCUBATED FOR BETTER GROWTH Performed at [facility], [place] [address] [place],  [place] [address]    Report Status PENDING  Incomplete   Organism ID, Bacteria PSEUDOMONAS AERUGINOSA  Final      Susceptibility   Pseudomonas aeruginosa - MIC*    CEFTAZIDIME 4 SENSITIVE Sensitive     CIPROFLOXACIN >=4 RESISTANT Resistant     GENTAMICIN <=1 SENSITIVE Sensitive     IMIPENEM 2 SENSITIVE Sensitive     PIP/TAZO 8 SENSITIVE Sensitive     CEFEPIME 2 SENSITIVE Sensitive     * PSEUDOMONAS AERUGINOSA  Urine culture     Status: Abnormal   Collection Time: 04/08/20  9:25 PM   Specimen: Urine, Clean Catch  Result Value Ref Range Status   Specimen Description URINE, CLEAN CATCH  Final   Special Requests   Final    NONE Performed at [facility], [place] [address] [place], [place] [address]    Culture MULTIPLE SPECIES PRESENT, SUGGEST RECOLLECTION (A)  Final   Report Status 04/10/2020 FINAL  Final  Blood Culture ID Panel (Reflexed)     Status: Abnormal   Collection Time: 04/08/20  9:25 PM  Result Value Ref Range Status   Enterococcus faecalis DETECTED (A) NOT DETECTED Final    Comment: CRITICAL RESULT CALLED TO, READ BACK BY AND VERIFIED WITH: PHARMD F WILSON 04/09/20 AT 1724 SK    Enterococcus Faecium NOT DETECTED NOT DETECTED Final   Listeria monocytogenes NOT DETECTED NOT DETECTED Final   Staphylococcus species NOT DETECTED NOT DETECTED Final   Staphylococcus aureus (BCID) NOT DETECTED NOT DETECTED Final   Staphylococcus epidermidis NOT DETECTED NOT DETECTED Final   Staphylococcus lugdunensis NOT DETECTED NOT DETECTED Final   Streptococcus species NOT DETECTED NOT DETECTED Final   Streptococcus agalactiae NOT DETECTED NOT DETECTED Final   Streptococcus pneumoniae NOT DETECTED NOT DETECTED Final   Streptococcus pyogenes NOT DETECTED NOT DETECTED Final   A.calcoaceticus-baumannii NOT DETECTED NOT DETECTED Final   Bacteroides fragilis NOT DETECTED NOT DETECTED Final   Enterobacterales NOT DETECTED NOT DETECTED Final   Enterobacter cloacae complex NOT DETECTED NOT DETECTED  Final   Escherichia coli NOT DETECTED NOT DETECTED Final   Klebsiella aerogenes NOT DETECTED NOT DETECTED Final   Klebsiella oxytoca NOT DETECTED NOT DETECTED Final   Klebsiella pneumoniae NOT DETECTED NOT DETECTED Final   Proteus species NOT DETECTED NOT DETECTED Final   Salmonella species NOT DETECTED NOT DETECTED Final   Serratia marcescens NOT DETECTED NOT DETECTED Final   Haemophilus influenzae NOT DETECTED NOT DETECTED Final   Neisseria meningitidis NOT DETECTED NOT DETECTED Final   Pseudomonas aeruginosa DETECTED (A) NOT DETECTED Final    Comment: CRITICAL RESULT CALLED TO, READ BACK BY AND VERIFIED WITH: PHARMD F WILSON 04/09/20 AT 1724 SK    Stenotrophomonas maltophilia NOT DETECTED NOT DETECTED Final  Candida albicans NOT DETECTED NOT DETECTED Final   Candida auris NOT DETECTED NOT DETECTED Final   Candida glabrata NOT DETECTED NOT DETECTED Final   Candida krusei NOT DETECTED NOT DETECTED Final   Candida parapsilosis NOT DETECTED NOT DETECTED Final   Candida tropicalis NOT DETECTED NOT DETECTED Final   Cryptococcus neoformans/gattii NOT DETECTED NOT DETECTED Final   CTX-M ESBL NOT DETECTED NOT DETECTED Final   Carbapenem resistance IMP NOT DETECTED NOT DETECTED Final   Carbapenem resistance KPC NOT DETECTED NOT DETECTED Final   Carbapenem resistance NDM NOT DETECTED NOT DETECTED Final   Vancomycin resistance NOT DETECTED NOT DETECTED Final   Carbapenem resistance VIM NOT DETECTED NOT DETECTED Final    Comment: Performed at [place] [address] [place], [place] [address]  Blood Culture (routine x 2)     Status: None (Preliminary result)   Collection Time: 04/08/20  9:34 PM   Specimen: BLOOD RIGHT HAND  Result Value Ref Range Status   Specimen Description BLOOD RIGHT HAND  Final   Special Requests   Final    BOTTLES DRAWN AEROBIC AND ANAEROBIC Blood Culture results may not be optimal due to an inadequate volume of blood received in culture bottles   Culture    Final    NO GROWTH 3 DAYS Performed at [facility], [place] [address], [place], [place] [address]    Report Status PENDING  Incomplete      Radiology Studies: No results found.  Scheduled Meds: . ferrous sulfate  325 mg Oral Q breakfast  . heparin  5,000 Units Subcutaneous Q8H  . insulin aspart  0-5 Units Subcutaneous QHS  . insulin aspart  0-9 Units Subcutaneous TID WC  . insulin glargine  5 Units Subcutaneous QHS  . loratadine  10 mg Oral Daily  . omega-3 acid ethyl esters  1 g Oral BID  . pravastatin  20 mg Oral Daily   Continuous Infusions: . sodium chloride    . ampicillin (OMNIPEN) IV 2 g (04/11/20 0534)  . ceFEPime (MAXIPIME) IV 2 g (04/10/20 2019)  . sodium bicarbonate (isotonic) 150 mEq in D5W 1000 mL infusion 50 mL/hr at 04/11/20 0157     LOS: 2 days   Time spent: 30 minutes   Darliss Cheney, MD [facility]  04/11/2020, 11:05 AM   To contact the attending provider between 7A-7P or the covering provider during after hours 7P-7A, please log into the web site [email or web address].

## 2020-04-11 NOTE — Progress Notes (Signed)
Initial Nutrition Assessment  DOCUMENTATION CODES:   Non-severe (moderate) malnutrition in context of chronic illness  INTERVENTION:    Glucerna Shake po TID, each supplement provides 220 kcal and 10 grams of protein  MVI daily   NUTRITION DIAGNOSIS:   Moderate Malnutrition related to chronic illness (recurrent PNA/UTIs) as evidenced by mild fat depletion, moderate muscle depletion.  GOAL:   Patient will meet greater than or equal to 90% of their needs  MONITOR:   PO intake, Supplement acceptance, Weight trends, Labs, I & O's  REASON FOR ASSESSMENT:   Malnutrition Screening Tool    ASSESSMENT:   Patient with PMH significant for bladder s/p ileal conduit, calcific pancreatitis, CKD III, CAD, aortic stenosis, HTN, HLD, recurring UTIs, recurrent GI bleeds history of CVA, DM, duodenal ulcer, and COVID PNA in January 2021 (3 episodes of PNA since then). Presents this admission with UTI and CAP.   Pt endorses a loss in appetite over the last week due to feeling of fatigue. States during this time he consumed B-plain greek yogurt with honey and berries L- soup D-meat, vegetable, grain. Does not consume supplementation. Appetite slow to progress. Had a couple bites of breakfast this am. Discussed the importance of protein intake for preservation of lean body mass. Pt willing to try Glucerna as he's had it before.   Pt reports a UBW of 185 lb and denies wt loss. Records indicate pt weighed 193 lb on 1/8 and 187 lb this admission (3.1% wt loss in 8 months, insignificant for time frame).   Drips: NS @ 75 ml/hr, sodium bicarb Medications: SS novolog, lantus Labs: CBG 126-162  NUTRITION - FOCUSED PHYSICAL EXAM:    Most Recent Value  Orbital Region No depletion  Upper Arm Region Moderate depletion  Thoracic and Lumbar Region Unable to assess  Buccal Region Mild depletion  Temple Region Severe depletion  Clavicle Bone Region Moderate depletion  Clavicle and Acromion Bone Region  Moderate depletion  Scapular Bone Region Unable to assess  Dorsal Hand No depletion  Patellar Region No depletion  Anterior Thigh Region No depletion  Posterior Calf Region No depletion  Edema (RD Assessment) Mild  Hair Reviewed  Eyes Reviewed  Mouth Reviewed  Skin Reviewed  Nails Reviewed     Diet Order:   Diet Order            Diet heart healthy/carb modified Room service appropriate? Yes; Fluid consistency: Thin  Diet effective now                 EDUCATION NEEDS:   Education needs have been addressed  Skin:  Skin Assessment: Reviewed RN Assessment  Last BM:  9/26  Height:   Ht Readings from Last 1 Encounters:  04/08/20 5\' 7"  (1.702 m)    Weight:   Wt Readings from Last 1 Encounters:  04/08/20 85 kg    BMI:  Body mass index is 29.35 kg/m.  Estimated Nutritional Needs:   Kcal:  2300-2500 kcal  Protein:  115-130 grams  Fluid:  >/= 2.3 L/day   Mariana Single RD, LDN Clinical Nutrition Pager listed in [place]

## 2020-04-11 NOTE — Plan of Care (Signed)

## 2020-04-11 NOTE — Progress Notes (Addendum)
[facility]    Date of Admission:  04/08/2020     Total Course of Antibiotics Ampicillin Day 3 Cefepime Day 2   Switching to Zosyn, will start this evening or tomorrow morning     Assessment:  Shane [name] is an 84 y/o M with a PMHx of malignant neoplasm of bladder p/o cystectomy, CKD stage III, CVA, CAD, HTN, and T2DM who presents to the ED with a fall. Shane [name] states Shane [name] feels well today and less weak when ambulating to and from the bathroom. Over the past 24 hours the patient has not had a temperature over 100.4 F. Shane [name] denies any new symptoms of chills, shortness of breath, nausea, vomiting, diarrhea. Of note, the patient stated that prior to being admitted, Shane [name] did have a few episodes of diarrhea that may have contributed to some dehydration.   One of his sets of blood cultures had an aerobic vial that was positive for enterococcus faecalis and pseudomonas aeruginosa and today the other vial in this set that was anaerobic, was positive for gram positive rods and gram negative rods. As a result of this, another set of blood cultures were ordered and drawn today. While these new sets of cultures will be affected by the antibiotics the patient has already received, if there is a true source of infection such as endocarditis, these cultures should return positive. Patient does have history of mitral valve prolapse. Currently possible explanations for positive cultures are contamination vs infectious source. Will continue to monitor and treat patient with IV antibiotics at this time because of these positive cultures. Clinically the patient does not appear to be   We will change the patient's antibiotics from ampicillin/cefepime to zosyn. This switch is occurring because zosyn is a single medicine with similar coverage to the ampicillin and cefepime. It will also make outpatient treatment more convenient as well.   The patient has continued to improve and recommend continuation  of IV fluids and antibiotics at this time.   Plan: 1. Start Zosyn and discontinued ampicillin and cefepime 2. Repeat blood cultures pending 3. Continue IV fluids  4. Continue trending WBC and to monitor vital signs   Principal Problem:   Severe sepsis (HCC) Active Problems:   Hypertension   Diabetes mellitus without complication ([place])   UTI (urinary tract infection)   Malignant neoplasm of urinary bladder (HCC)   S/P ileal conduit (HCC)   AKI (acute kidney injury) ([place])   Lobar pneumonia (HCC)   Scheduled Meds: . feeding supplement (GLUCERNA SHAKE)  237 mL Oral TID BM  . ferrous sulfate  325 mg Oral Q breakfast  . heparin  5,000 Units Subcutaneous Q8H  . insulin aspart  0-5 Units Subcutaneous QHS  . insulin aspart  0-9 Units Subcutaneous TID WC  . insulin glargine  5 Units Subcutaneous QHS  . loratadine  10 mg Oral Daily  . multivitamin with minerals  1 tablet Oral Daily  . omega-3 acid ethyl esters  1 g Oral BID  . pravastatin  20 mg Oral Daily   Continuous Infusions: . sodium chloride 75 mL/hr at 04/11/20 1139  . ampicillin (OMNIPEN) IV 2 g (04/11/20 0534)  . ceFEPime (MAXIPIME) IV 2 g (04/10/20 2019)  . sodium bicarbonate (isotonic) 150 mEq in D5W 1000 mL infusion 50 mL/hr at 04/11/20 0157   HPI: Shane [name] is a 84 y.o. male admitted on 9/27 for fever chills with suspected severe sepsis.   This afternoon, Shane [name] states Shane [name] is feeling well. Shane [name] notes that Shane [name] ambulated with a walker and with nursing assistance to the bathroom and during this Shane [name] felt less weak than Shane [name] did upon admission.   His daughter was concerned about him eating less since being admitted, however, upon further questioning the patient noted that Shane [name] was not fond of the hospital food. When asked if Shane [name] had his normal diet present would Shane [name] eat it and Shane [name] stated yes.   Shane [name] denies any fever, chills, worsening of his chronic morning cough, shortness of breath. Shane [name] does note some blood streaked mucous  upon blowing his nose today that occurred twice. Shane [name] denies abdominal pain, nausea, vomiting, diarrhea. Shane [name] has no pain around is urostomy site. Shane [name] continues to have left lower extremity swelling that the patient notes is chronic from prior trauma to the area.   The patient's daughter had many questions concerning whether or not the patient had sepsis and also if Shane [name] truly had an infection or not. I explained that sepsis takes into consideration the patient's vital signs, laboratory data and clinical picture. I then explained that upon my initial examination the patient did not appear to be septic and had improved with IV fluids. It was also not evident if there was a clear source of infection or not and only 1 vial was positive for growth. This may have differed from the patient's primary providers examination that led them to believe the patient was severely septic at that time.   I also added that we are currently treating the patient as if Shane [name] was septic with IV antibiotics and fluid. We would be repeating the blood cultures and if they are negative we would consider stopping the antibiotics.   The patient's daughter also mentioned that prior to admission the patient had eaten a meal with the family and after had multiple bouts of diarrhea that resolved on their own.   She voiced understanding and had no further questions at the time.   Review of Systems: Review of Systems  Constitutional: Negative for chills and fever.  Respiratory: Positive for cough. Negative for shortness of breath.   Cardiovascular: Positive for leg swelling.  Gastrointestinal: Negative for abdominal pain, diarrhea, nausea and vomiting.  Genitourinary: Negative for flank pain.  Musculoskeletal: Negative for back pain.  Neurological: Negative for weakness.    Past Medical History:  Diagnosis Date  . Abnormal EKG 2014   This led to stress test which was abnl, which led to cardiology referral: cath showed small vessel dz of  2nd diag branch with 70-80% stenosis--preserved LV function  . Aortic atherosclerosis ([place])    w/out aneurism  . Bilateral renal cysts    Non-complex (CT 03/2016): no enhancement, coarse calcifications, mass effect does give false appearance of hydro on non-contract series (followed by urol).  . Bladder cancer ([place]) 03/2016   Invasive high grade urothelial carcinoma.  No mets at dx.  Cystoscopy + bx and partial resection of tumor in hosp when admitted for gross hematuria and AUR.  Neoadj chemo 06/2016, then got cystoprostatectomy. (Urostomy--ileal conduit urinary diversion with refluxing anastamoses--has RLQ urostomy)-gets routine surveillance by urol (Dr. Tresa Moore), most recent 04/2017--no sign of recurrence--obs status  . CAD (coronary artery disease)    small vessel dz x 1 vessel on cath 2014: med mgmt  . Cerebellar cerebrovascular accident (CVA) without late effect 10/2017   Left, punctate: DAPT therapy x 3 wks per Dr. Leonie Man, neuro, then ASA 81mg  alone.  Stable as of 11/2017  f/u with neuro, Dr. Erlinda Hong.  . Chronic calcific pancreatitis ([place]) 03/2016   +stable on f/u imaging 11/2018, + 2 small pseudocysts  . Chronic renal insufficiency, stage 2 (mild) 2019   Borderline II/III  . Diabetes mellitus without complication ([place])    Insulin dependent; DKA admission 10/2017.  . GI bleed 12/2017   Presented with melena and Hb around 5.  Transfused 5 U pRBCs, started to get hematochezia in hosp: no clear explanation for GI bleeding was found on EGD or colonoscopy.  CT abd w/out acute abnormality.  Upper GI w/SBFT showed subtle duodenal erosion.  [name] Heart murmur, systolic    Noted in prior PCP's records.  I recommended echocardiogram 07/2016 but pt declined.  [name] History of Bell's palsy   . History of chemotherapy   . Hyperlipidemia   . Hypertension   . IDA (iron deficiency anemia) 12/2017   GI bleed  . Moderate aortic stenosis    Echo 03/2019. Pt asymptomatic.  Rpt 1 yr.   . Pancreas cyst 03/2016   CT abd/pelv  12/2017 for GI bleed w/u showed 1.6 cm pancreatic head cyst-->stable and likely benign.  Abd MRI 11/2018->stable pancreatic pseudocysts + chronic pancreatitis changes. Rpt MRI abd w &w/out contrast 2 yrs (approx 11/2020).  [name] Umbilical hernia    Repaired when Shane [name] got his bladder surgery.    Social History   Tobacco Use  . Smoking status: Former Smoker    Years: 3.00  . Smokeless tobacco: Never Used  Vaping Use  . Vaping Use: Never used  Substance Use Topics  . Alcohol use: No  . Drug use: No    Family History  Problem Relation Age of Onset  . Diabetes Mellitus I Mother   . Diabetes Mellitus I Sister   . Stroke Paternal Grandfather    Allergies  Allergen Reactions  . Other Swelling and Other (See Comments)    Pt states had swelling of the eye when having eye surgery and the anesthetic placed in eye- recovered the same day    OBJECTIVE: Blood pressure 123/84, pulse 71, temperature 99.4 F (37.4 C), resp. rate 17, height 5\' 7"  (1.702 m), weight 85 kg, SpO2 95 %.  Physical Exam Vitals and nursing note reviewed.  Constitutional:      Appearance: Normal appearance.  HENT:     Head: Normocephalic and atraumatic.  Eyes:     Extraocular Movements: Extraocular movements intact.  Cardiovascular:     Rate and Rhythm: Normal rate and regular rhythm.     Heart sounds: Murmur (Systolic murmur) heard.   Pulmonary:     Effort: Pulmonary effort is normal. No respiratory distress.     Breath sounds: Normal breath sounds. No stridor. No wheezing, rhonchi or rales.  Abdominal:     Comments: Urostomy in place, no erythema or tenderness around site.   Musculoskeletal:     Left lower leg: Edema present.  Neurological:     General: No focal deficit present.     Mental Status: Shane [name] is alert and oriented to person, place, and time.     Lab Results Lab Results  Component Value Date   WBC 11.4 (H) 04/11/2020   HGB 10.7 (L) 04/11/2020   HCT 32.3 (L) 04/11/2020   MCV 89.7 04/11/2020   PLT  259 04/11/2020    Lab Results  Component Value Date   CREATININE 2.36 (H) 04/11/2020   BUN 42 (H) 04/11/2020   NA 135 04/11/2020   K 4.1 04/11/2020   CL  107 04/11/2020   CO2 16 (L) 04/11/2020    Lab Results  Component Value Date   ALT 16 04/09/2020   AST 16 04/09/2020   ALKPHOS 62 04/09/2020   BILITOT 0.5 04/09/2020     Microbiology: Recent Results (from the past 240 hour(s))  Respiratory Panel by RT PCR (Flu A&B, Covid) - Nasopharyngeal Swab     Status: None   Collection Time: 04/08/20  9:00 PM   Specimen: Nasopharyngeal Swab  Result Value Ref Range Status   SARS Coronavirus 2 by RT PCR NEGATIVE NEGATIVE Final    Comment: (NOTE) SARS-CoV-2 target nucleic acids are NOT DETECTED.  The SARS-CoV-2 RNA is generally detectable in upper respiratoy specimens during the acute phase of infection. The lowest concentration of SARS-CoV-2 viral copies this assay can detect is 131 copies/mL. A negative result does not preclude SARS-Cov-2 infection and should not be used as the sole basis for treatment or other patient management decisions. A negative result may occur with  improper specimen collection/handling, submission of specimen other than nasopharyngeal swab, presence of viral mutation(s) within the areas targeted by this assay, and inadequate number of viral copies (<131 copies/mL). A negative result must be combined with clinical observations, patient history, and epidemiological information. The expected result is Negative.  Fact Sheet for Patients:  PinkCheek.be  Fact Sheet for Healthcare Providers:  GravelBags.it  This test is no t yet approved or cleared by the [place] FDA and  has been authorized for detection and/or diagnosis of SARS-CoV-2 by FDA under an Emergency Use Authorization (EUA). This EUA will remain  in effect (meaning this test can be used) for the duration of the COVID-19 declaration under  Section 564(b)(1) of the Act, 21 U.S.C. section 360bbb-3(b)(1), unless the authorization is terminated or revoked sooner.     Influenza A by PCR NEGATIVE NEGATIVE Final   Influenza B by PCR NEGATIVE NEGATIVE Final    Comment: (NOTE) The Xpert Xpress SARS-CoV-2/FLU/RSV assay is intended as an aid in  the diagnosis of influenza from Nasopharyngeal swab specimens and  should not be used as a sole basis for treatment. Nasal washings and  aspirates are unacceptable for Xpert Xpress SARS-CoV-2/FLU/RSV  testing.  Fact Sheet for Patients: PinkCheek.be  Fact Sheet for Healthcare Providers: GravelBags.it  This test is not yet approved or cleared by the [place] FDA and  has been authorized for detection and/or diagnosis of SARS-CoV-2 by  FDA under an Emergency Use Authorization (EUA). This EUA will remain  in effect (meaning this test can be used) for the duration of the  Covid-19 declaration under Section 564(b)(1) of the Act, 21  U.S.C. section 360bbb-3(b)(1), unless the authorization is  terminated or revoked. Performed at [facility], [place] [address], [place], [place] [address]   Blood Culture (routine x 2)     Status: Abnormal (Preliminary result)   Collection Time: 04/08/20  9:25 PM   Specimen: BLOOD LEFT ARM  Result Value Ref Range Status   Specimen Description BLOOD LEFT ARM  Final   Special Requests   Final    BOTTLES DRAWN AEROBIC AND ANAEROBIC Blood Culture adequate volume   Culture  Setup Time   Final    GRAM POSITIVE COCCI IN CHAINS GRAM NEGATIVE RODS AEROBIC BOTTLE ONLY CRITICAL RESULT CALLED TO, READ BACK BY AND VERIFIED WITH: PHARMD F WILSON 04/09/20 AT 35 SK GRAM POSITIVE RODS GRAM NEGATIVE RODS ANAEROBIC BOTTLE ONLY CRITICAL RESULT CALLED TO, READ BACK BY AND VERIFIED WITH: F  WILSON PHARMD 1700 04/10/20 A BROWNING    Culture (A)  Final    ENTEROCOCCUS FAECALIS PSEUDOMONAS AERUGINOSA REPEATING  SUSCEPTIBILITY FOR ENTEROCOCCUS FAECALIS CULTURE REINCUBATED FOR BETTER GROWTH Performed at [facility], [place] [address] [place], [place] [address]    Report Status PENDING  Incomplete   Organism ID, Bacteria PSEUDOMONAS AERUGINOSA  Final      Susceptibility   Pseudomonas aeruginosa - MIC*    CEFTAZIDIME 4 SENSITIVE Sensitive     CIPROFLOXACIN >=4 RESISTANT Resistant     GENTAMICIN <=1 SENSITIVE Sensitive     IMIPENEM 2 SENSITIVE Sensitive     PIP/TAZO 8 SENSITIVE Sensitive     CEFEPIME 2 SENSITIVE Sensitive     * PSEUDOMONAS AERUGINOSA  Urine culture     Status: Abnormal   Collection Time: 04/08/20  9:25 PM   Specimen: Urine, Clean Catch  Result Value Ref Range Status   Specimen Description URINE, CLEAN CATCH  Final   Special Requests   Final    NONE Performed at [facility], [place] [address] [place], [place] [address]    Culture MULTIPLE SPECIES PRESENT, SUGGEST RECOLLECTION (A)  Final   Report Status 04/10/2020 FINAL  Final  Blood Culture ID Panel (Reflexed)     Status: Abnormal   Collection Time: 04/08/20  9:25 PM  Result Value Ref Range Status   Enterococcus faecalis DETECTED (A) NOT DETECTED Final    Comment: CRITICAL RESULT CALLED TO, READ BACK BY AND VERIFIED WITH: PHARMD F WILSON 04/09/20 AT 1724 SK    Enterococcus Faecium NOT DETECTED NOT DETECTED Final   Listeria monocytogenes NOT DETECTED NOT DETECTED Final   Staphylococcus species NOT DETECTED NOT DETECTED Final   Staphylococcus aureus (BCID) NOT DETECTED NOT DETECTED Final   Staphylococcus epidermidis NOT DETECTED NOT DETECTED Final   Staphylococcus lugdunensis NOT DETECTED NOT DETECTED Final   Streptococcus species NOT DETECTED NOT DETECTED Final   Streptococcus agalactiae NOT DETECTED NOT DETECTED Final   Streptococcus pneumoniae NOT DETECTED NOT DETECTED Final   Streptococcus pyogenes NOT DETECTED NOT DETECTED Final   A.calcoaceticus-baumannii NOT DETECTED NOT DETECTED Final   Bacteroides  fragilis NOT DETECTED NOT DETECTED Final   Enterobacterales NOT DETECTED NOT DETECTED Final   Enterobacter cloacae complex NOT DETECTED NOT DETECTED Final   Escherichia coli NOT DETECTED NOT DETECTED Final   Klebsiella aerogenes NOT DETECTED NOT DETECTED Final   Klebsiella oxytoca NOT DETECTED NOT DETECTED Final   Klebsiella pneumoniae NOT DETECTED NOT DETECTED Final   Proteus species NOT DETECTED NOT DETECTED Final   Salmonella species NOT DETECTED NOT DETECTED Final   Serratia marcescens NOT DETECTED NOT DETECTED Final   Haemophilus influenzae NOT DETECTED NOT DETECTED Final   Neisseria meningitidis NOT DETECTED NOT DETECTED Final   Pseudomonas aeruginosa DETECTED (A) NOT DETECTED Final    Comment: CRITICAL RESULT CALLED TO, READ BACK BY AND VERIFIED WITH: PHARMD F WILSON 04/09/20 AT 1724 SK    Stenotrophomonas maltophilia NOT DETECTED NOT DETECTED Final   Candida albicans NOT DETECTED NOT DETECTED Final   Candida auris NOT DETECTED NOT DETECTED Final   Candida glabrata NOT DETECTED NOT DETECTED Final   Candida krusei NOT DETECTED NOT DETECTED Final   Candida parapsilosis NOT DETECTED NOT DETECTED Final   Candida tropicalis NOT DETECTED NOT DETECTED Final   Cryptococcus neoformans/gattii NOT DETECTED NOT DETECTED Final   CTX-M ESBL NOT DETECTED NOT DETECTED Final   Carbapenem resistance IMP NOT DETECTED NOT DETECTED Final   Carbapenem resistance  KPC NOT DETECTED NOT DETECTED Final   Carbapenem resistance NDM NOT DETECTED NOT DETECTED Final   Vancomycin resistance NOT DETECTED NOT DETECTED Final   Carbapenem resistance VIM NOT DETECTED NOT DETECTED Final    Comment: Performed at [facility], [place] [address] [place], [place] [address]  Blood Culture (routine x 2)     Status: None (Preliminary result)   Collection Time: 04/08/20  9:34 PM   Specimen: BLOOD RIGHT HAND  Result Value Ref Range Status   Specimen Description BLOOD RIGHT HAND  Final   Special Requests   Final     BOTTLES DRAWN AEROBIC AND ANAEROBIC Blood Culture results may not be optimal due to an inadequate volume of blood received in culture bottles   Culture   Final    NO GROWTH 3 DAYS Performed at [facility], [place] [address] [place], [place] [address]    Report Status PENDING  Incomplete    Sanjuana Letters, DO Internal Medicine Resident

## 2020-04-11 NOTE — Progress Notes (Signed)
Pharmacy Antibiotic Note  Shane [name] is a 84 y.o. male admitted on 04/08/2020 with sepsis.  Pharmacy has been consulted for sepsis dosing.  WBC 11.4, afebrile. Scr 2.36 (CrCl 25 mL/min). PCT 9.21, LA 3>1.6. On day #4 of antibiotics. BCx showing enterococcus faecalis and pseudomonas.   Plan: Zosyn 3.375g IV q8h (4 hour infusion). Monitor renal fx, cx results, clinical pic, adn ID recs  Height: 5\' 7"  (170.2 cm) Weight: 85 kg (187 lb 6.3 oz) IBW/kg (Calculated) : 66.1  Temp (24hrs), Avg:98.7 F (37.1 C), Min:97.9 F (36.6 C), Max:99.4 F (37.4 C)  Recent Labs  Lab 04/08/20 2120 04/08/20 2250 04/09/20 0411 04/09/20 0725 04/09/20 0943 04/10/20 0522 04/11/20 0054  WBC 12.8*  --   --  13.3*  --  11.1* 11.4*  CREATININE 2.58*  --   --  2.52*  --  2.39* 2.36*  LATICACIDVEN 2.2* 3.0* 1.0  --  1.6  --   --     Estimated Creatinine Clearance: 25.2 mL/min (A) (by C-G formula based on SCr of 2.36 mg/dL (H)).    Allergies  Allergen Reactions  . Other Swelling and Other (See Comments)    Pt states had swelling of the eye when having eye surgery and the anesthetic placed in eye- recovered the same day    Thank you for allowing pharmacy to be a part of this patient's care.  Antonietta Jewel, PharmD, [place] Clinical Pharmacist  Phone: [phone] 04/11/2020 3:33 PM  Please check AMION for all [place] phone numbers After 10:00 PM, call [place] [phone]

## 2020-04-12 ENCOUNTER — Encounter (HOSPITAL_COMMUNITY): Payer: Self-pay | Admitting: Internal Medicine

## 2020-04-12 LAB — CBC WITH DIFFERENTIAL/PLATELET
Abs Immature Granulocytes: 0.05 10*3/uL (ref 0.00–0.07)
Basophils Absolute: 0 10*3/uL (ref 0.0–0.1)
Basophils Relative: 0 %
Eosinophils Absolute: 0.2 10*3/uL (ref 0.0–0.5)
Eosinophils Relative: 2 %
HCT: 30.8 % — ABNORMAL LOW (ref 39.0–52.0)
Hemoglobin: 10.1 g/dL — ABNORMAL LOW (ref 13.0–17.0)
Immature Granulocytes: 1 %
Lymphocytes Relative: 14 %
Lymphs Abs: 1.2 10*3/uL (ref 0.7–4.0)
MCH: 30 pg (ref 26.0–34.0)
MCHC: 32.8 g/dL (ref 30.0–36.0)
MCV: 91.4 fL (ref 80.0–100.0)
Monocytes Absolute: 0.7 10*3/uL (ref 0.1–1.0)
Monocytes Relative: 8 %
Neutro Abs: 6.7 10*3/uL (ref 1.7–7.7)
Neutrophils Relative %: 75 %
Platelets: 230 10*3/uL (ref 150–400)
RBC: 3.37 MIL/uL — ABNORMAL LOW (ref 4.22–5.81)
RDW: 14.8 % (ref 11.5–15.5)
WBC: 8.8 10*3/uL (ref 4.0–10.5)
nRBC: 0 % (ref 0.0–0.2)

## 2020-04-12 LAB — BASIC METABOLIC PANEL
Anion gap: 10 (ref 5–15)
BUN: 37 mg/dL — ABNORMAL HIGH (ref 8–23)
CO2: 23 mmol/L (ref 22–32)
Calcium: 7.7 mg/dL — ABNORMAL LOW (ref 8.9–10.3)
Chloride: 104 mmol/L (ref 98–111)
Creatinine, Ser: 2.26 mg/dL — ABNORMAL HIGH (ref 0.61–1.24)
GFR calc Af Amer: 30 mL/min — ABNORMAL LOW (ref >60)
GFR calc non Af Amer: 26 mL/min — ABNORMAL LOW (ref >60)
Glucose, Bld: 129 mg/dL — ABNORMAL HIGH (ref 70–99)
Potassium: 3.3 mmol/L — ABNORMAL LOW (ref 3.5–5.1)
Sodium: 137 mmol/L (ref 135–145)

## 2020-04-12 LAB — GLUCOSE, CAPILLARY
Glucose-Capillary: 125 mg/dL — ABNORMAL HIGH (ref 70–99)
Glucose-Capillary: 104 mg/dL — ABNORMAL HIGH (ref 70–99)
Glucose-Capillary: 100 mg/dL — ABNORMAL HIGH (ref 70–99)
Glucose-Capillary: 144 mg/dL — ABNORMAL HIGH (ref 70–99)

## 2020-04-12 LAB — PROTIME-INR
INR: 1.3 — ABNORMAL HIGH (ref 0.8–1.2)
Prothrombin Time: 16.1 seconds — ABNORMAL HIGH (ref 11.4–15.2)

## 2020-04-12 MED ORDER — POTASSIUM CHLORIDE CRYS ER 20 MEQ PO TBCR
40.0000 meq | EXTENDED_RELEASE_TABLET | ORAL | Status: AC
  Administered 2020-04-12 (×2): 40 meq via ORAL
  Filled 2020-04-12 (×2): qty 2

## 2020-04-12 NOTE — Consult Note (Signed)
Urology Consult   Physician requesting consult: Dr. Darliss Cheney  Reason for consult: Sepsis from urinary source; left renal pelvis stone causing obstruction; history of T2 bladder cancer s/p RC/IC 10/2016  History of Present Illness: Shane [name] is a 84 y.o. who presented to the ED on 04/08/2020 with recurrent falls, fever, weakness and altered mental status.  He has had a complex medical history over the past few months for a contracted COVID-19 in January.  He initially recovered however had multiple other episodes of pneumonia which led to some deconditioning.  In the emergency department he was found to have a temperature of 100.9 as well as systolic blood pressure in the 80s, sign of infection of his urinary tract with urinalysis demonstrating many bacteria, white blood cells, large leukocyte esterase.  His urine culture returned multiple organisms however no speciation was performed.  As he had an AKI with his creatinine of 2.5, he was unable to undergo a contrasted study.  He ultimately underwent an MRI on 04/11/2020 that revealed a 10 x 6 mm stone within his left renal pelvis with surrounding inflammation and moderate hydronephrosis.  There is also evidence of filling defects possibly tumor versus infectious debris within the left renal pelvis collecting system.  He was also found to have a possible right lower pole stone with no hydronephrosis on the right side.  Patient present reports that he denies any abdominal pain or flank pain.  He denies recent fevers or chills.  He has been mobilizing with physical therapy today.  He states that his Kindl has been draining appropriately and no evidence of any hematuria.  Prior to this he has not had recurrent urinary tract infections.  He does have a history of p T2 N0 M0 R0 urothelial carcinoma the bladder status post radical cystectomy with ileal conduit in 10/2016 with Dr. Tresa Moore.  Past Medical History:  Diagnosis Date  . Abnormal EKG 2014    This led to stress test which was abnl, which led to cardiology referral: cath showed small vessel dz of 2nd diag branch with 70-80% stenosis--preserved LV function  . Aortic atherosclerosis ([place])    w/out aneurism  . Bilateral renal cysts    Non-complex (CT 03/2016): no enhancement, coarse calcifications, mass effect does give false appearance of hydro on non-contract series (followed by urol).  . Bladder cancer ([place]) 03/2016   Invasive high grade urothelial carcinoma.  No mets at dx.  Cystoscopy + bx and partial resection of tumor in hosp when admitted for gross hematuria and AUR.  Neoadj chemo 06/2016, then got cystoprostatectomy. (Urostomy--ileal conduit urinary diversion with refluxing anastamoses--has RLQ urostomy)-gets routine surveillance by urol (Dr. Tresa Moore), most recent 04/2017--no sign of recurrence--obs status  . CAD (coronary artery disease)    small vessel dz x 1 vessel on cath 2014: med mgmt  . Cerebellar cerebrovascular accident (CVA) without late effect 10/2017   Left, punctate: DAPT therapy x 3 wks per Dr. Leonie Man, neuro, then ASA 81mg  alone.  Stable as of 11/2017 f/u with neuro, Dr. Erlinda Hong.  . Chronic calcific pancreatitis ([place]) 03/2016   +stable on f/u imaging 11/2018, + 2 small pseudocysts  . Chronic renal insufficiency, stage 2 (mild) 2019   Borderline II/III  . Diabetes mellitus without complication ([place])    Insulin dependent; DKA admission 10/2017.  . GI bleed 12/2017   Presented with melena and Hb around 5.  Transfused 5 U pRBCs, started to get hematochezia in hosp: no clear explanation for GI bleeding was  found on EGD or colonoscopy.  CT abd w/out acute abnormality.  Upper GI w/SBFT showed subtle duodenal erosion.  [name] Heart murmur, systolic    Noted in prior PCP's records.  I recommended echocardiogram 07/2016 but pt declined.  [name] History of Bell's palsy   . History of chemotherapy   . Hyperlipidemia   . Hypertension   . IDA (iron deficiency anemia) 12/2017   GI bleed  .  Moderate aortic stenosis    Echo 03/2019. Pt asymptomatic.  Rpt 1 yr.   . Pancreas cyst 03/2016   CT abd/pelv 12/2017 for GI bleed w/u showed 1.6 cm pancreatic head cyst-->stable and likely benign.  Abd MRI 11/2018->stable pancreatic pseudocysts + chronic pancreatitis changes. Rpt MRI abd w &w/out contrast 2 yrs (approx 11/2020).  [name] Umbilical hernia    Repaired when he got his bladder surgery.    Past Surgical History:  Procedure Laterality Date  . BIOPSY  12/18/2017   Procedure: BIOPSY;  Surgeon: Doran Stabler, MD;  Location: [facility];  Service: Gastroenterology;;  . CARDIAC CATHETERIZATION  02/09/2013   small vessel dz of 2nd diag branch with 70-80% stenosis--preserved LV function. Medical therapy rec'd. Agustin Cree, PA)  . CARDIOVASCULAR STRESS TEST  01/26/2013   No ischemia.  Wall motion abnormalities at inferior base suggesting small area of infarction.  EF 64%.  . CAROTID ARTERY DOPPLERS  10/15/2017   1-39% stenosis R ICA, no stenosis L ICA.  [name] CATARACT EXTRACTION    . COLONOSCOPY N/A 12/19/2017   Clotted blood in cecum w/out any underlying abnormality found -->entired colon and terminal ilium normal.  Procedure: COLONOSCOPY;  Surgeon: Doran Stabler, MD;  Location: [facility];  Service: Gastroenterology;  Laterality: N/A;  . cystoprostatectomy w/LN surgery  10/17/2016   Ileal conduit: this is an incontinent urine diversion that directs urine from the ureters through a segment of isolated bowel to the surface of the abdominal wall via a cutaneous stoma, and urine drains continuously into an external ostomy appliance.  . CYSTOSCOPY WITH INJECTION N/A 10/17/2016   Procedure: CYSTOSCOPY WITH INJECTION OF INDOCYANINE GREEN DYE;  Surgeon: Alexis Frock, MD;  Location: [facility];  Service: Urology;  Laterality: N/A;  . ESOPHAGOGASTRODUODENOSCOPY N/A 12/18/2017   Mild chronic gastritis, no metaplasia or dysplasia.  H pylori pending as of 12/21/17.  nonbleeding duodenal ulcers.  Procedure:  ESOPHAGOGASTRODUODENOSCOPY (EGD);  Surgeon: Doran Stabler, MD;  Location: [facility];  Service: Gastroenterology;  Laterality: N/A;  . EYE SURGERY     cataract surgery bilat   . IR GENERIC HISTORICAL  05/12/2016   IR US GUIDE VASC ACCESS RIGHT 05/12/2016 Greggory Keen, MD [facility]  . IR GENERIC HISTORICAL  05/12/2016   IR FLUORO GUIDE PORT INSERTION RIGHT 05/12/2016 Greggory Keen, MD [facility]  . IR REMOVAL TUN ACCESS W/ PORT W/O FL MOD SED  07/03/2017  . OSTOMY     urostomy  . TONSILLECTOMY AND ADENOIDECTOMY  Remote  . TRANSTHORACIC ECHOCARDIOGRAM  10/16/2017; 03/2019   10/2017 EF 65-70%, normal wall motion, grd I DD.  03/2019 same but also new moderate aortic stenosis. Rpt echo 1 yr.  . TRANSURETHRAL RESECTION OF PROSTATE N/A 04/02/2016   Procedure: CYSTOSCOPY, TRANSURETHRAL RESECTION OF BLADDER TUMOR;  Surgeon: [name], MD;  Location: [facility];  Service: Urology;  Laterality: N/A;  . UMBILICAL HERNIA REPAIR  10/2016  . VASECTOMY      Medications:  Home meds:  No current facility-administered medications on file prior to encounter.  Current Outpatient Medications on File Prior to Encounter  Medication Sig Dispense Refill  . albuterol (VENTOLIN HFA) 108 (90 Base) MCG/ACT inhaler Inhale 2 puffs into the lungs every 6 (six) hours as needed. (Patient taking differently: Inhale 2 puffs into the lungs every 6 (six) hours as needed for wheezing or shortness of breath. ) 18 g 0  . cetirizine (ZYRTEC) 10 MG tablet Take 1 tablet (10 mg total) by mouth daily. (Patient taking differently: Take 10 mg by mouth daily as needed for allergies or rhinitis. ) 30 tablet 11  . Continuous Blood Gluc Sensor (FREESTYLE LIBRE 14 DAY SENSOR) MISC USE AS DIRECTED (Patient taking differently: Inject 1 patch into the skin every 14 (fourteen) days. ) 2 each 12  . Ferrous Sulfate 27 MG TABS Take 27 mg by mouth daily with breakfast.    . Omega-3 Fatty Acids (FISH OIL PO) Take 1 capsule by mouth  daily.     . pravastatin (PRAVACHOL) 20 MG tablet TAKE 1 TABLET BY MOUTH EVERY DAY (Patient taking differently: Take 20 mg by mouth daily. ) 30 tablet 0  . aspirin EC 81 MG tablet Take 1 tablet (81 mg total) by mouth daily. (Patient not taking: Reported on 04/08/2020) 30 tablet 0  . azithromycin (ZITHROMAX) 250 MG tablet Take 2 tablets by mouth on day 1, followed by 1 tablet by mouth daily for 4 days. (Patient not taking: Reported on 04/08/2020) 6 tablet 0  . benzonatate (TESSALON) 100 MG capsule Take 1 capsule (100 mg total) by mouth 3 (three) times daily as needed for cough. (Patient not taking: Reported on 04/08/2020) 30 capsule 0  . FERROUS SULFATE PO Take 27 mg by mouth. Takes 1 daily (Patient not taking: Reported on 04/08/2020)    . insulin aspart (NOVOLOG FLEXPEN) 100 UNIT/ML FlexPen CBG < 70: implement hypoglycemia protocol CBG 70 - 120: 3 units CBG 121 - 150: 5 units CBG 151 - 200: 6 units CBG 201 - 250: 8 units CBG 251 - 300: 11 units CBG 301 - 350: 14 units CBG 351 - 400: 18 units CBG > 400: call MD 15 mL 6  . Insulin Glargine (LANTUS SOLOSTAR) 100 UNIT/ML Solostar Pen INJECT 5 UNITS SUBQ AT BEDTIME (Patient taking differently: Inject 5 Units into the skin at bedtime. INJECT 5 UNITS SUBQ AT BEDTIME) 15 mL 3  . Insulin Pen Needle (PEN NEEDLES) 32G X 4 MM MISC 1 each by Does not apply route daily. 100 each 11  . Insulin Pen Needle 31G X 5 MM MISC Inject insulin via insulin pen 3 x daily with meals 100 each 3  . Lancets 30G MISC Use to check blood sugar 4 times daily 100 each 0  . lisinopril (ZESTRIL) 20 MG tablet Take 1 tablet (20 mg total) by mouth 2 (two) times daily. (Patient not taking: Reported on 04/08/2020) 90 tablet 3     Scheduled Meds: . feeding supplement (GLUCERNA SHAKE)  237 mL Oral TID BM  . ferrous sulfate  325 mg Oral Q breakfast  . heparin  5,000 Units Subcutaneous Q8H  . insulin aspart  0-5 Units Subcutaneous QHS  . insulin aspart  0-9 Units Subcutaneous TID WC  .  insulin glargine  5 Units Subcutaneous QHS  . loratadine  10 mg Oral Daily  . multivitamin with minerals  1 tablet Oral Daily  . omega-3 acid ethyl esters  1 g Oral BID  . potassium chloride  40 mEq Oral Q4H  . pravastatin  20 mg  Oral Daily   Continuous Infusions: . sodium chloride 75 mL/hr at 04/11/20 1139  . piperacillin-tazobactam (ZOSYN)  IV 3.375 g (04/12/20 0817)   PRN Meds:.acetaminophen **OR** acetaminophen, albuterol, ondansetron **OR** ondansetron (ZOFRAN) IV  Allergies:  Allergies  Allergen Reactions  . Other Swelling and Other (See Comments)    Pt states had swelling of the eye when having eye surgery and the anesthetic placed in eye- recovered the same day    Family History  Problem Relation Age of Onset  . Diabetes Mellitus I Mother   . Diabetes Mellitus I Sister   . Stroke Paternal Grandfather     Social History:  reports that he has quit smoking. He quit after 3.00 years of use. He has never used smokeless tobacco. He reports that he does not drink alcohol and does not use drugs.  ROS: A complete review of systems was performed.  All systems are negative except for pertinent findings as noted.  Physical Exam:  Vital signs in last 24 hours: Temp:  [98.4 F (36.9 C)-100.1 F (37.8 C)] 98.4 F (36.9 C) (09/30 0738) Pulse Rate:  [64-70] 64 (09/30 0738) Resp:  [17-20] 17 (09/30 0738) BP: (107-118)/(62-70) 114/62 (09/30 0738) SpO2:  [96 %-98 %] 96 % (09/30 0738) Constitutional:  Alert and oriented, No acute distress Cardiovascular: Regular rate and rhythm Respiratory: Normal respiratory effort, Lungs clear bilaterally GI: Abdomen is soft, nontender, nondistended, no abdominal masses; IC p/p with clear yellow urine in the bag. Genitourinary: No CVAT.  Neurologic: Grossly intact, no focal deficits Psychiatric: Normal mood and affect  Laboratory Data:  Recent Labs    04/10/20 0522 04/11/20 0054 04/12/20 0601  WBC 11.1* 11.4* 8.8  HGB 11.1* 10.7* 10.1*   HCT 34.0* 32.3* 30.8*  PLT 234 259 230    Recent Labs    04/10/20 0522 04/11/20 0054 04/12/20 0601  NA 137 135 137  K 3.1* 4.1 3.3*  CL 110 107 104  GLUCOSE 153* 125* 129*  BUN 46* 42* 37*  CALCIUM 7.8* 7.8* 7.7*  CREATININE 2.39* 2.36* 2.26*     Results for orders placed or performed during the hospital encounter of 04/08/20 (from the past 24 hour(s))  Glucose, capillary     Status: Abnormal   Collection Time: 04/11/20  4:34 PM  Result Value Ref Range   Glucose-Capillary 158 (H) 70 - 99 mg/dL  Glucose, capillary     Status: Abnormal   Collection Time: 04/11/20  7:41 PM  Result Value Ref Range   Glucose-Capillary 134 (H) 70 - 99 mg/dL  CBC with Differential/Platelet     Status: Abnormal   Collection Time: 04/12/20  6:01 AM  Result Value Ref Range   WBC 8.8 4.0 - 10.5 K/uL   RBC 3.37 (L) 4.22 - 5.81 MIL/uL   Hemoglobin 10.1 (L) 13.0 - 17.0 g/dL   HCT 30.8 (L) 39 - 52 %   MCV 91.4 80.0 - 100.0 fL   MCH 30.0 26.0 - 34.0 pg   MCHC 32.8 30.0 - 36.0 g/dL   RDW 14.8 11.5 - 15.5 %   Platelets 230 150 - 400 K/uL   nRBC 0.0 0.0 - 0.2 %   Neutrophils Relative % 75 %   Neutro Abs 6.7 1.7 - 7.7 K/uL   Lymphocytes Relative 14 %   Lymphs Abs 1.2 0.7 - 4.0 K/uL   Monocytes Relative 8 %   Monocytes Absolute 0.7 0 - 1 K/uL   Eosinophils Relative 2 %   Eosinophils Absolute 0.2  0 - 0 K/uL   Basophils Relative 0 %   Basophils Absolute 0.0 0 - 0 K/uL   Immature Granulocytes 1 %   Abs Immature Granulocytes 0.05 0.00 - 0.07 K/uL  Basic metabolic panel     Status: Abnormal   Collection Time: 04/12/20  6:01 AM  Result Value Ref Range   Sodium 137 135 - 145 mmol/L   Potassium 3.3 (L) 3.5 - 5.1 mmol/L   Chloride 104 98 - 111 mmol/L   CO2 23 22 - 32 mmol/L   Glucose, Bld 129 (H) 70 - 99 mg/dL   BUN 37 (H) 8 - 23 mg/dL   Creatinine, Ser 2.26 (H) 0.61 - 1.24 mg/dL   Calcium 7.7 (L) 8.9 - 10.3 mg/dL   GFR calc non Af Amer 26 (L) >60 mL/min   GFR calc Af Amer 30 (L) >60 mL/min    Anion gap 10 5 - 15  Glucose, capillary     Status: Abnormal   Collection Time: 04/12/20  7:40 AM  Result Value Ref Range   Glucose-Capillary 125 (H) 70 - 99 mg/dL   Recent Results (from the past 240 hour(s))  Respiratory Panel by RT PCR (Flu A&B, Covid) - Nasopharyngeal Swab     Status: None   Collection Time: 04/08/20  9:00 PM   Specimen: Nasopharyngeal Swab  Result Value Ref Range Status   SARS Coronavirus 2 by RT PCR NEGATIVE NEGATIVE Final    Comment: (NOTE) SARS-CoV-2 target nucleic acids are NOT DETECTED.  The SARS-CoV-2 RNA is generally detectable in upper respiratoy specimens during the acute phase of infection. The lowest concentration of SARS-CoV-2 viral copies this assay can detect is 131 copies/mL. A negative result does not preclude SARS-Cov-2 infection and should not be used as the sole basis for treatment or other patient management decisions. A negative result may occur with  improper specimen collection/handling, submission of specimen other than nasopharyngeal swab, presence of viral mutation(s) within the areas targeted by this assay, and inadequate number of viral copies (<131 copies/mL). A negative result must be combined with clinical observations, patient history, and epidemiological information. The expected result is Negative.  Fact Sheet for Patients:  PinkCheek.be  Fact Sheet for Healthcare Providers:  GravelBags.it  This test is no t yet approved or cleared by the [place] FDA and  has been authorized for detection and/or diagnosis of SARS-CoV-2 by FDA under an Emergency Use Authorization (EUA). This EUA will remain  in effect (meaning this test can be used) for the duration of the COVID-19 declaration under Section 564(b)(1) of the Act, 21 U.S.C. section 360bbb-3(b)(1), unless the authorization is terminated or revoked sooner.     Influenza A by PCR NEGATIVE NEGATIVE Final    Influenza B by PCR NEGATIVE NEGATIVE Final    Comment: (NOTE) The Xpert Xpress SARS-CoV-2/FLU/RSV assay is intended as an aid in  the diagnosis of influenza from Nasopharyngeal swab specimens and  should not be used as a sole basis for treatment. Nasal washings and  aspirates are unacceptable for Xpert Xpress SARS-CoV-2/FLU/RSV  testing.  Fact Sheet for Patients: PinkCheek.be  Fact Sheet for Healthcare Providers: GravelBags.it  This test is not yet approved or cleared by the [place] FDA and  has been authorized for detection and/or diagnosis of SARS-CoV-2 by  FDA under an Emergency Use Authorization (EUA). This EUA will remain  in effect (meaning this test can be used) for the duration of the  Covid-19 declaration under Section 564(b)(1) of the  Act, 21  U.S.C. section 360bbb-3(b)(1), unless the authorization is  terminated or revoked. Performed at [facility], [place] [address], [place], [place] [address]   Blood Culture (routine x 2)     Status: Abnormal (Preliminary result)   Collection Time: 04/08/20  9:25 PM   Specimen: BLOOD LEFT ARM  Result Value Ref Range Status   Specimen Description BLOOD LEFT ARM  Final   Special Requests   Final    BOTTLES DRAWN AEROBIC AND ANAEROBIC Blood Culture adequate volume   Culture  Setup Time   Final    GRAM POSITIVE COCCI IN CHAINS GRAM NEGATIVE RODS AEROBIC BOTTLE ONLY CRITICAL RESULT CALLED TO, READ BACK BY AND VERIFIED WITH: PHARMD F WILSON 04/09/20 AT 6 SK GRAM POSITIVE RODS GRAM NEGATIVE RODS ANAEROBIC BOTTLE ONLY CRITICAL RESULT CALLED TO, READ BACK BY AND VERIFIED WITHTillman Sers PHARMD 1700 04/10/20 A BROWNING Performed at [facility], [place] [address], [place], [place] [address]    Culture ENTEROCOCCUS FAECALIS PSEUDOMONAS AERUGINOSA  (A)  Final   Report Status PENDING  Incomplete   Organism ID, Bacteria PSEUDOMONAS AERUGINOSA  Final   Organism ID,  Bacteria ENTEROCOCCUS FAECALIS  Final      Susceptibility   Enterococcus faecalis - MIC*    AMPICILLIN <=2 SENSITIVE Sensitive     VANCOMYCIN 2 SENSITIVE Sensitive     GENTAMICIN SYNERGY SENSITIVE Sensitive     * ENTEROCOCCUS FAECALIS   Pseudomonas aeruginosa - MIC*    CEFTAZIDIME 4 SENSITIVE Sensitive     CIPROFLOXACIN >=4 RESISTANT Resistant     GENTAMICIN <=1 SENSITIVE Sensitive     IMIPENEM 2 SENSITIVE Sensitive     PIP/TAZO 8 SENSITIVE Sensitive     CEFEPIME 2 SENSITIVE Sensitive     * PSEUDOMONAS AERUGINOSA  Urine culture     Status: Abnormal   Collection Time: 04/08/20  9:25 PM   Specimen: Urine, Clean Catch  Result Value Ref Range Status   Specimen Description URINE, CLEAN CATCH  Final   Special Requests   Final    NONE Performed at [facility], [place] [address], [place], [place] [address]    Culture MULTIPLE SPECIES PRESENT, SUGGEST RECOLLECTION (A)  Final   Report Status 04/10/2020 FINAL  Final  Blood Culture ID Panel (Reflexed)     Status: Abnormal   Collection Time: 04/08/20  9:25 PM  Result Value Ref Range Status   Enterococcus faecalis DETECTED (A) NOT DETECTED Final    Comment: CRITICAL RESULT CALLED TO, READ BACK BY AND VERIFIED WITH: PHARMD F WILSON 04/09/20 AT 1724 SK    Enterococcus Faecium NOT DETECTED NOT DETECTED Final   Listeria monocytogenes NOT DETECTED NOT DETECTED Final   Staphylococcus species NOT DETECTED NOT DETECTED Final   Staphylococcus aureus (BCID) NOT DETECTED NOT DETECTED Final   Staphylococcus epidermidis NOT DETECTED NOT DETECTED Final   Staphylococcus lugdunensis NOT DETECTED NOT DETECTED Final   Streptococcus species NOT DETECTED NOT DETECTED Final   Streptococcus agalactiae NOT DETECTED NOT DETECTED Final   Streptococcus pneumoniae NOT DETECTED NOT DETECTED Final   Streptococcus pyogenes NOT DETECTED NOT DETECTED Final   A.calcoaceticus-baumannii NOT DETECTED NOT DETECTED Final   Bacteroides fragilis NOT DETECTED NOT DETECTED  Final   Enterobacterales NOT DETECTED NOT DETECTED Final   Enterobacter cloacae complex NOT DETECTED NOT DETECTED Final   Escherichia coli NOT DETECTED NOT DETECTED Final   Klebsiella aerogenes NOT DETECTED NOT DETECTED Final   Klebsiella oxytoca NOT DETECTED NOT  DETECTED Final   Klebsiella pneumoniae NOT DETECTED NOT DETECTED Final   Proteus species NOT DETECTED NOT DETECTED Final   Salmonella species NOT DETECTED NOT DETECTED Final   Serratia marcescens NOT DETECTED NOT DETECTED Final   Haemophilus influenzae NOT DETECTED NOT DETECTED Final   Neisseria meningitidis NOT DETECTED NOT DETECTED Final   Pseudomonas aeruginosa DETECTED (A) NOT DETECTED Final    Comment: CRITICAL RESULT CALLED TO, READ BACK BY AND VERIFIED WITH: PHARMD F WILSON 04/09/20 AT 1724 SK    Stenotrophomonas maltophilia NOT DETECTED NOT DETECTED Final   Candida albicans NOT DETECTED NOT DETECTED Final   Candida auris NOT DETECTED NOT DETECTED Final   Candida glabrata NOT DETECTED NOT DETECTED Final   Candida krusei NOT DETECTED NOT DETECTED Final   Candida parapsilosis NOT DETECTED NOT DETECTED Final   Candida tropicalis NOT DETECTED NOT DETECTED Final   Cryptococcus neoformans/gattii NOT DETECTED NOT DETECTED Final   CTX-M ESBL NOT DETECTED NOT DETECTED Final   Carbapenem resistance IMP NOT DETECTED NOT DETECTED Final   Carbapenem resistance KPC NOT DETECTED NOT DETECTED Final   Carbapenem resistance NDM NOT DETECTED NOT DETECTED Final   Vancomycin resistance NOT DETECTED NOT DETECTED Final   Carbapenem resistance VIM NOT DETECTED NOT DETECTED Final    Comment: Performed at [facility], [address] [place], [place] [address]  Blood Culture (routine x 2)     Status: None (Preliminary result)   Collection Time: 04/08/20  9:34 PM   Specimen: BLOOD RIGHT HAND  Result Value Ref Range Status   Specimen Description BLOOD RIGHT HAND  Final   Special Requests   Final    BOTTLES DRAWN AEROBIC AND ANAEROBIC  Blood Culture results may not be optimal due to an inadequate volume of blood received in culture bottles   Culture   Final    NO GROWTH 4 DAYS Performed at [facility], [place] [address] [place], [place] [address]    Report Status PENDING  Incomplete  Culture, blood (routine x 2)     Status: None (Preliminary result)   Collection Time: 04/11/20 11:50 AM   Specimen: BLOOD RIGHT HAND  Result Value Ref Range Status   Specimen Description BLOOD RIGHT HAND  Final   Special Requests   Final    BOTTLES DRAWN AEROBIC AND ANAEROBIC Blood Culture adequate volume   Culture   Final    NO GROWTH < 24 HOURS Performed at [facility], [place] [address] [place], [place] [address]    Report Status PENDING  Incomplete  Culture, blood (routine x 2)     Status: None (Preliminary result)   Collection Time: 04/11/20 12:00 PM   Specimen: BLOOD RIGHT ARM  Result Value Ref Range Status   Specimen Description BLOOD RIGHT ARM  Final   Special Requests   Final    BOTTLES DRAWN AEROBIC AND ANAEROBIC Blood Culture adequate volume   Culture   Final    NO GROWTH < 24 HOURS Performed at [facility], [place] [address], [place], [place] [address]    Report Status PENDING  Incomplete    Renal Function: Recent Labs    04/08/20 2120 04/09/20 0725 04/10/20 0522 04/11/20 0054 04/12/20 0601  CREATININE 2.58* 2.52* 2.39* 2.36* 2.26*   Estimated Creatinine Clearance: 26.3 mL/min (A) (by C-G formula based on SCr of 2.26 mg/dL (H)).  Radiologic Imaging: MR ABDOMEN WO CONTRAST  Result Date: 04/12/2020 CLINICAL DATA:  Renal mass, renal insufficiency  EXAM: MRI ABDOMEN WITHOUT CONTRAST TECHNIQUE: Multiplanar multisequence MR imaging was performed without the administration of intravenous contrast. COMPARISON:  CT of the abdomen and pelvis dated December 19, 2017 FINDINGS: Lower chest: No sign of consolidation or pleural effusion. Limited assessment of lung bases on MRI. Hepatobiliary: Limited assessment on  noncontrast imaging of the liver. No visible lesion with suspicious features. No pericholecystic stranding or sign of biliary duct dilation. Pancreas: 13 mm lesion in the head of the pancreas with cystic features. Mild main duct distension grossly similar to previous imaging with ectatic side branches. The cystic lesion in the head of the pancreas is unchanged compared to Dec 04, 2018. Loss of normal intrinsic T1 hyperintensity within the pancreatic parenchyma is similar to previous imaging Spleen: Spleen normal in size and contour without focal lesion. Limited assessment without contrast. Adrenals/Urinary Tract:  Adrenal glands are grossly normal. Moderate LEFT-sided hydroureteronephrosis. In the LEFT renal pelvis is an area of low T2 signal measuring approximately 10 x 6 mm. Extensive inflammatory changes surround the LEFT renal pelvis. There is distension of LEFT renal collecting systems as described, some of this distension showing intermediate rather than high T2 signal without high T1 signal and with signs of profound restricted diffusion. Mixed signal on diffusion is noted with a more central area in the renal sinus measuring approximately 3.1 x 0.9 cm displaying less diffusion than material that fills the peripheral collecting systems. Striated parenchymal signal on T2 weighted imaging and on diffusion RIGHT kidney with potential calculus in the lower pole best seen a susceptibility on image 25 of series 12. This is nonspecific. Mild fullness of the RIGHT ureter without overt hydronephrosis in the setting of ileal conduit following cysto prostatectomy. Stomach/Bowel: Limited assessment of gastrointestinal structures. RIGHT lower quadrant ileal conduit ostomy. Small bowel, likely a portion of the conduit contained within a small ventral hernia is imaged only on coronal images, grossly unchanged accounting for incomplete visualization as compared to December 19, 2017. Study not performed for bowel evaluation and  therefore with limited assessment of bowel. Vascular/Lymphatic: Vascular structures in the abdomen not well assessed with signs of atheromatous plaque. Mildly enlarged lymph nodes along the LEFT periaortic chain, largest 13 mm on image 66 of series 10. Other: Generalized mild body wall edema. No visible ascites. No perinephric collection. Musculoskeletal: No focal, suspicious bone lesion visualized. IMPRESSION: 1. Interval migration of a calculus (approximately 10 x 6 mm) from the peripheral collecting systems of the LEFT kidney into the renal pelvis with surrounding inflammation and associated moderate hydronephrosis. 2. Material filling calices and collecting system elements may represent tumor or infectious debris, urologic consultation with retrograde evaluation as warranted is suggested. Noncontrast CT could be performed for confirmation of renal pelvic calculus if desired. 3. Striated appearance of parenchyma on the LEFT on T2 weighted imaging suggests concomitant pyelonephritis, combined with central obstructing calculus as described. 4. Mild fullness of the RIGHT ureter without overt hydronephrosis in the setting of ileal conduit following cysto prostatectomy. 5. Mildly enlarged LEFT periaortic lymph nodes, nonspecific. 6. 13 mm cystic lesion in the head of the pancreas with cystic features. This is unchanged compared to previous imaging and may represent a side branch IPMN. Attention on follow-up. MRCP at 2 year interval could be performed as warranted for further assessment These results will be called to the ordering clinician or representative by the Radiologist Assistant, and communication documented in the PACS or [employer]. Electronically Signed   By: Zetta Bills M.D.   On:  04/12/2020 08:18   US RENAL  Result Date: 04/11/2020 CLINICAL DATA:  Acute kidney injury. EXAM: RENAL / URINARY TRACT ULTRASOUND COMPLETE COMPARISON:  Dec 04, 2018.  December 19, 2017 FINDINGS: Right Kidney: Renal  measurements: 11.5 x 5.5 x 5.0 cm = volume: 166 mL. Mild hydronephrosis is noted. Echogenicity within normal limits. No mass visualized. Left Kidney: Renal measurements: 11.8 x 6.7 x 6.0 cm = volume: 248 mL. Echogenicity within normal limits. Mild hydronephrosis is noted. Lobulated echogenic abnormality is noted in the left renal pelvis. It is nonshadowing and measures approximately 2.5 x 3.0 cm. Bladder: Surgically removed due to cancer. Other: None. IMPRESSION: 1. Mild bilateral hydronephrosis is noted. 2. 3 cm lobular echogenic abnormality is noted in the left renal pelvis which is concerning for possible mass or malignancy. CT scan or MRI scan with and without contrast administration is recommended for further evaluation. Electronically Signed   By: Marijo Conception M.D.   On: 04/11/2020 13:21    I independently reviewed the above imaging studies.  Impression/Recommendation 1. Left renal pelvis stone measuring 10x61mm causing obstruction: MR abd 9/29 with 10 x 6 mm left renal pelvis stone with moderate hydronephrosis. 2. Left renal pelvis filling defect possibly tumor versus infectious debris.  Seen on MR abd 9/29. 3. Right lower pole calculus.  Possibly seen on MR abd 9/29.  No evidence for hydronephrosis on the right side. 4. Sepsis due to urinary source: Presented to ED with recurrent falls, fever, weakness, AMS. T-max 100.9 in ED with systolic blood pressures in the 80s.  UA with many bacteria, white blood cells, large leukocyte esterase.  Urine culture 04/08/2020 with multiple species present.  Blood culture growing Enterococcus faecalis and Pseudomonas.  In setting of obstruction from left-sided renal pelvis stone as above. 5. AKI on CKD 3a: Cr 2.26 on 04/12/2020 at time of consultation 6. 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  Typically this would involve a retrograde ureteral stent placement.   However given his history of ileoconduit, this could be quite challenging.  Therefore we recommend a left percutaneous nephrostomy tube placement.  I discussed with Dr. Earleen Newport with interventional radiology who will plan to place left PCN. -Obtain new urine culture from L PCN once placed as previous culture grew multiple organisms with no speciation. -Continue Zosyn per ID -We will arrange for follow-up with Dr. Tresa Moore to treat left renal pelvis stone as well as evaluate for possible left upper tract tumor as there is some suggestion of possible tumor with filling defect on MR 04/11/2020. This will be done as an outpatient after he recovers from his infection.  Janith Lima 04/12/2020, 12:03 PM  [facility]  Pager: [phone]   CC: Darliss Cheney

## 2020-04-12 NOTE — Consult Note (Signed)
   [facility] CM Inpatient Consult   04/12/2020  Shane [name] 24-Apr-1936 [id number]  [facility] [[facility]] Patient: Medicare NextGen   Patient screened for showing as high risk score for unplanned readmission although this is patient's first admission in the past 6 months.  Chart reviewed to check for potential [facility] service needs.  Review of patient's medical record reveals patient is recommended for home health but declines services at this time.  Primary Care Provider is a new MD in same office per social worker notes and services with [facility] prior to admission noted.  Plan:  Continue to follow progress and disposition to assess for post hospital care management needs. If continue with [facility] no other follow up needs assessed at this current time.  Please place a [facility] Care Management consult if needs changes as appropriate and for questions contact:   Natividad Brood, RN BSN [facility] Liaison Toll free office [phone]  Fax number: [phone] [email or web address]

## 2020-04-12 NOTE — Progress Notes (Signed)
[facility]   Reason for visit: Follow up on bacteremia  Interval History: repeat blood cultures remain ngtd.  No complaints.  Daughter at bedside.    Physical Exam: Constitutional:  Vitals:   04/12/20 0738 04/12/20 1606  BP: 114/62 (!) 114/57  Pulse: 64 60  Resp: 17 17  Temp: 98.4 F (36.9 C) 98.9 F (37.2 C)  SpO2: 96% 97%   patient appears in NAD; up in chair Respiratory: Normal respiratory effort; CTA B Cardiovascular: RRR GI: soft, nt, nd  Review of Systems: Constitutional: negative for fevers and chills Gastrointestinal: negative for diarrhea Integument/breast: negative for rash  Lab Results  Component Value Date   WBC 8.8 04/12/2020   HGB 10.1 (L) 04/12/2020   HCT 30.8 (L) 04/12/2020   MCV 91.4 04/12/2020   PLT 230 04/12/2020    Lab Results  Component Value Date   CREATININE 2.26 (H) 04/12/2020   BUN 37 (H) 04/12/2020   NA 137 04/12/2020   K 3.3 (L) 04/12/2020   CL 104 04/12/2020   CO2 23 04/12/2020    Lab Results  Component Value Date   ALT 16 04/09/2020   AST 16 04/09/2020   ALKPHOS 62 04/09/2020     Microbiology: Recent Results (from the past 240 hour(s))  Respiratory Panel by RT PCR (Flu A&B, Covid) - Nasopharyngeal Swab     Status: None   Collection Time: 04/08/20  9:00 PM   Specimen: Nasopharyngeal Swab  Result Value Ref Range Status   SARS Coronavirus 2 by RT PCR NEGATIVE NEGATIVE Final    Comment: (NOTE) SARS-CoV-2 target nucleic acids are NOT DETECTED.  The SARS-CoV-2 RNA is generally detectable in upper respiratoy specimens during the acute phase of infection. The lowest concentration of SARS-CoV-2 viral copies this assay can detect is 131 copies/mL. A negative result does not preclude SARS-Cov-2 infection and should not be used as the sole basis for treatment or other patient management decisions. A negative result may occur with  improper specimen collection/handling, submission of specimen  other than nasopharyngeal swab, presence of viral mutation(s) within the areas targeted by this assay, and inadequate number of viral copies (<131 copies/mL). A negative result must be combined with clinical observations, patient history, and epidemiological information. The expected result is Negative.  Fact Sheet for Patients:  PinkCheek.be  Fact Sheet for Healthcare Providers:  GravelBags.it  This test is no t yet approved or cleared by the [place] FDA and  has been authorized for detection and/or diagnosis of SARS-CoV-2 by FDA under an Emergency Use Authorization (EUA). This EUA will remain  in effect (meaning this test can be used) for the duration of the COVID-19 declaration under Section 564(b)(1) of the Act, 21 U.S.C. section 360bbb-3(b)(1), unless the authorization is terminated or revoked sooner.     Influenza A by PCR NEGATIVE NEGATIVE Final   Influenza B by PCR NEGATIVE NEGATIVE Final    Comment: (NOTE) The Xpert Xpress SARS-CoV-2/FLU/RSV assay is intended as an aid in  the diagnosis of influenza from Nasopharyngeal swab specimens and  should not be used as a sole basis for treatment. Nasal washings and  aspirates are unacceptable for Xpert Xpress SARS-CoV-2/FLU/RSV  testing.  Fact Sheet for Patients: PinkCheek.be  Fact Sheet for Healthcare Providers: GravelBags.it  This test is not yet approved or cleared by the [place] FDA and  has been authorized for detection and/or diagnosis of SARS-CoV-2 by  FDA under an Emergency Use Authorization (EUA). This EUA  will remain  in effect (meaning this test can be used) for the duration of the  Covid-19 declaration under Section 564(b)(1) of the Act, 21  U.S.C. section 360bbb-3(b)(1), unless the authorization is  terminated or revoked. Performed at [facility], [place] [address]  [place], [place] [address]   Blood Culture (routine x 2)     Status: Abnormal (Preliminary result)   Collection Time: 04/08/20  9:25 PM   Specimen: BLOOD LEFT ARM  Result Value Ref Range Status   Specimen Description BLOOD LEFT ARM  Final   Special Requests   Final    BOTTLES DRAWN AEROBIC AND ANAEROBIC Blood Culture adequate volume   Culture  Setup Time   Final    GRAM POSITIVE COCCI IN CHAINS GRAM NEGATIVE RODS AEROBIC BOTTLE ONLY CRITICAL RESULT CALLED TO, READ BACK BY AND VERIFIED WITH: PHARMD F WILSON 04/09/20 AT 55 SK GRAM POSITIVE RODS GRAM NEGATIVE RODS ANAEROBIC BOTTLE ONLY CRITICAL RESULT CALLED TO, READ BACK BY AND VERIFIED WITHTillman Sers PHARMD 1700 04/10/20 A BROWNING Performed at [facility], [place] [address], [place], [place] [address]    Culture ENTEROCOCCUS FAECALIS PSEUDOMONAS AERUGINOSA  (A)  Final   Report Status PENDING  Incomplete   Organism ID, Bacteria PSEUDOMONAS AERUGINOSA  Final   Organism ID, Bacteria ENTEROCOCCUS FAECALIS  Final      Susceptibility   Enterococcus faecalis - MIC*    AMPICILLIN <=2 SENSITIVE Sensitive     VANCOMYCIN 2 SENSITIVE Sensitive     GENTAMICIN SYNERGY SENSITIVE Sensitive     * ENTEROCOCCUS FAECALIS   Pseudomonas aeruginosa - MIC*    CEFTAZIDIME 4 SENSITIVE Sensitive     CIPROFLOXACIN >=4 RESISTANT Resistant     GENTAMICIN <=1 SENSITIVE Sensitive     IMIPENEM 2 SENSITIVE Sensitive     PIP/TAZO 8 SENSITIVE Sensitive     CEFEPIME 2 SENSITIVE Sensitive     * PSEUDOMONAS AERUGINOSA  Urine culture     Status: Abnormal   Collection Time: 04/08/20  9:25 PM   Specimen: Urine, Clean Catch  Result Value Ref Range Status   Specimen Description URINE, CLEAN CATCH  Final   Special Requests   Final    NONE Performed at [facility], [place] [address], [place], [place] [address]    Culture MULTIPLE SPECIES PRESENT, SUGGEST RECOLLECTION (A)  Final   Report Status 04/10/2020 FINAL  Final  Blood Culture ID Panel (Reflexed)      Status: Abnormal   Collection Time: 04/08/20  9:25 PM  Result Value Ref Range Status   Enterococcus faecalis DETECTED (A) NOT DETECTED Final    Comment: CRITICAL RESULT CALLED TO, READ BACK BY AND VERIFIED WITH: PHARMD F WILSON 04/09/20 AT 1724 SK    Enterococcus Faecium NOT DETECTED NOT DETECTED Final   Listeria monocytogenes NOT DETECTED NOT DETECTED Final   Staphylococcus species NOT DETECTED NOT DETECTED Final   Staphylococcus aureus (BCID) NOT DETECTED NOT DETECTED Final   Staphylococcus epidermidis NOT DETECTED NOT DETECTED Final   Staphylococcus lugdunensis NOT DETECTED NOT DETECTED Final   Streptococcus species NOT DETECTED NOT DETECTED Final   Streptococcus agalactiae NOT DETECTED NOT DETECTED Final   Streptococcus pneumoniae NOT DETECTED NOT DETECTED Final   Streptococcus pyogenes NOT DETECTED NOT DETECTED Final   A.calcoaceticus-baumannii NOT DETECTED NOT DETECTED Final   Bacteroides fragilis NOT DETECTED NOT DETECTED Final   Enterobacterales NOT DETECTED NOT DETECTED Final   Enterobacter cloacae complex NOT DETECTED NOT DETECTED Final  Escherichia coli NOT DETECTED NOT DETECTED Final   Klebsiella aerogenes NOT DETECTED NOT DETECTED Final   Klebsiella oxytoca NOT DETECTED NOT DETECTED Final   Klebsiella pneumoniae NOT DETECTED NOT DETECTED Final   Proteus species NOT DETECTED NOT DETECTED Final   Salmonella species NOT DETECTED NOT DETECTED Final   Serratia marcescens NOT DETECTED NOT DETECTED Final   Haemophilus influenzae NOT DETECTED NOT DETECTED Final   Neisseria meningitidis NOT DETECTED NOT DETECTED Final   Pseudomonas aeruginosa DETECTED (A) NOT DETECTED Final    Comment: CRITICAL RESULT CALLED TO, READ BACK BY AND VERIFIED WITH: PHARMD F WILSON 04/09/20 AT 1724 SK    Stenotrophomonas maltophilia NOT DETECTED NOT DETECTED Final   Candida albicans NOT DETECTED NOT DETECTED Final   Candida auris NOT DETECTED NOT DETECTED Final   Candida glabrata NOT DETECTED NOT  DETECTED Final   Candida krusei NOT DETECTED NOT DETECTED Final   Candida parapsilosis NOT DETECTED NOT DETECTED Final   Candida tropicalis NOT DETECTED NOT DETECTED Final   Cryptococcus neoformans/gattii NOT DETECTED NOT DETECTED Final   CTX-M ESBL NOT DETECTED NOT DETECTED Final   Carbapenem resistance IMP NOT DETECTED NOT DETECTED Final   Carbapenem resistance KPC NOT DETECTED NOT DETECTED Final   Carbapenem resistance NDM NOT DETECTED NOT DETECTED Final   Vancomycin resistance NOT DETECTED NOT DETECTED Final   Carbapenem resistance VIM NOT DETECTED NOT DETECTED Final    Comment: Performed at [facility], [address] [place], [place] [address]  Blood Culture (routine x 2)     Status: None (Preliminary result)   Collection Time: 04/08/20  9:34 PM   Specimen: BLOOD RIGHT HAND  Result Value Ref Range Status   Specimen Description BLOOD RIGHT HAND  Final   Special Requests   Final    BOTTLES DRAWN AEROBIC AND ANAEROBIC Blood Culture results may not be optimal due to an inadequate volume of blood received in culture bottles   Culture   Final    NO GROWTH 4 DAYS Performed at [facility], [place] [address] [place], [place] [address]    Report Status PENDING  Incomplete  Culture, blood (routine x 2)     Status: None (Preliminary result)   Collection Time: 04/11/20 11:50 AM   Specimen: BLOOD RIGHT HAND  Result Value Ref Range Status   Specimen Description BLOOD RIGHT HAND  Final   Special Requests   Final    BOTTLES DRAWN AEROBIC AND ANAEROBIC Blood Culture adequate volume   Culture   Final    NO GROWTH < 24 HOURS Performed at [facility], [place] [address] [place], [place] [address]    Report Status PENDING  Incomplete  Culture, blood (routine x 2)     Status: None (Preliminary result)   Collection Time: 04/11/20 12:00 PM   Specimen: BLOOD RIGHT ARM  Result Value Ref Range Status   Specimen Description BLOOD RIGHT ARM  Final   Special Requests   Final     BOTTLES DRAWN AEROBIC AND ANAEROBIC Blood Culture adequate volume   Culture   Final    NO GROWTH < 24 HOURS Performed at [facility], [place] [address] [place], [place] [address]    Report Status PENDING  Incomplete    Impression/Plan:  1. Bacteremia - repeat cultures remain ngtd. On pip/tazo and tolerating well.  He will continue on this and will stop antibiotics at the time of discharge.  2.  Left renal stone -  plan for percutaneous nephrostomy tube placement by IR.

## 2020-04-12 NOTE — Consult Note (Addendum)
Chief Complaint: Patient was seen in consultation today for urosepsis  Referring Physician(s): Dr. Abner Greenspan  Supervising Physician: Corrie Mckusick  Patient Status: [facility] - In-pt  History of Present Illness: Shane [name] is a 84 y.o. male with past medical history of CAD, CVA in 2019, DM, HTN, invasive high grade urothelial carcinoma s/p ileal conduit who presented to the ED 4 days ago with weakness.  He was found to have a UTI and pneumonia. He was started on antibiotics however, worsened despite Rocephin and Zithromax, was changed to ampicillin and cefepime but then had positive blood cultures for Entercoccus and Pseudomonas so now on Zosyn. Started complaining of left flank pain. US showed a possible mass. MRI showed large left renal stone with pyelonephritis.  Urology was consulted who recommended percutaneous nephrostomy tube placement.   Patient assessed at bedside alongside his granddaughter.  He states he is feeling better and reports he has not had any further back pain.  He has several questions about the nephrostomy tube which are answered. He has been NPO as of 8AM. Last heparin injection was 5AM.   Past Medical History:  Diagnosis Date  . Abnormal EKG 2014   This led to stress test which was abnl, which led to cardiology referral: cath showed small vessel dz of 2nd diag branch with 70-80% stenosis--preserved LV function  . Aortic atherosclerosis ([place])    w/out aneurism  . Bilateral renal cysts    Non-complex (CT 03/2016): no enhancement, coarse calcifications, mass effect does give false appearance of hydro on non-contract series (followed by urol).  . Bladder cancer ([place]) 03/2016   Invasive high grade urothelial carcinoma.  No mets at dx.  Cystoscopy + bx and partial resection of tumor in hosp when admitted for gross hematuria and AUR.  Neoadj chemo 06/2016, then got cystoprostatectomy. (Urostomy--ileal conduit urinary diversion with refluxing anastamoses--has RLQ  urostomy)-gets routine surveillance by urol (Dr. Tresa Moore), most recent 04/2017--no sign of recurrence--obs status  . CAD (coronary artery disease)    small vessel dz x 1 vessel on cath 2014: med mgmt  . Cerebellar cerebrovascular accident (CVA) without late effect 10/2017   Left, punctate: DAPT therapy x 3 wks per Dr. Leonie Man, neuro, then ASA 81mg  alone.  Stable as of 11/2017 f/u with neuro, Dr. Erlinda Hong.  . Chronic calcific pancreatitis ([place]) 03/2016   +stable on f/u imaging 11/2018, + 2 small pseudocysts  . Chronic renal insufficiency, stage 2 (mild) 2019   Borderline II/III  . Diabetes mellitus without complication ([place])    Insulin dependent; DKA admission 10/2017.  . GI bleed 12/2017   Presented with melena and Hb around 5.  Transfused 5 U pRBCs, started to get hematochezia in hosp: no clear explanation for GI bleeding was found on EGD or colonoscopy.  CT abd w/out acute abnormality.  Upper GI w/SBFT showed subtle duodenal erosion.  [name] Heart murmur, systolic    Noted in prior PCP's records.  I recommended echocardiogram 07/2016 but pt declined.  [name] History of Bell's palsy   . History of chemotherapy   . Hyperlipidemia   . Hypertension   . IDA (iron deficiency anemia) 12/2017   GI bleed  . Moderate aortic stenosis    Echo 03/2019. Pt asymptomatic.  Rpt 1 yr.   . Pancreas cyst 03/2016   CT abd/pelv 12/2017 for GI bleed w/u showed 1.6 cm pancreatic head cyst-->stable and likely benign.  Abd MRI 11/2018->stable pancreatic pseudocysts + chronic pancreatitis changes. Rpt MRI abd w &w/out contrast 2  yrs (approx 11/2020).  [name] Umbilical hernia    Repaired when he got his bladder surgery.    Past Surgical History:  Procedure Laterality Date  . BIOPSY  12/18/2017   Procedure: BIOPSY;  Surgeon: Doran Stabler, MD;  Location: [facility];  Service: Gastroenterology;;  . CARDIAC CATHETERIZATION  02/09/2013   small vessel dz of 2nd diag branch with 70-80% stenosis--preserved LV function. Medical therapy rec'd.  Agustin Cree, PA)  . CARDIOVASCULAR STRESS TEST  01/26/2013   No ischemia.  Wall motion abnormalities at inferior base suggesting small area of infarction.  EF 64%.  . CAROTID ARTERY DOPPLERS  10/15/2017   1-39% stenosis R ICA, no stenosis L ICA.  [name] CATARACT EXTRACTION    . COLONOSCOPY N/A 12/19/2017   Clotted blood in cecum w/out any underlying abnormality found -->entired colon and terminal ilium normal.  Procedure: COLONOSCOPY;  Surgeon: Doran Stabler, MD;  Location: [facility];  Service: Gastroenterology;  Laterality: N/A;  . cystoprostatectomy w/LN surgery  10/17/2016   Ileal conduit: this is an incontinent urine diversion that directs urine from the ureters through a segment of isolated bowel to the surface of the abdominal wall via a cutaneous stoma, and urine drains continuously into an external ostomy appliance.  . CYSTOSCOPY WITH INJECTION N/A 10/17/2016   Procedure: CYSTOSCOPY WITH INJECTION OF INDOCYANINE GREEN DYE;  Surgeon: Alexis Frock, MD;  Location: [facility];  Service: Urology;  Laterality: N/A;  . ESOPHAGOGASTRODUODENOSCOPY N/A 12/18/2017   Mild chronic gastritis, no metaplasia or dysplasia.  H pylori pending as of 12/21/17.  nonbleeding duodenal ulcers.  Procedure: ESOPHAGOGASTRODUODENOSCOPY (EGD);  Surgeon: Doran Stabler, MD;  Location: [facility];  Service: Gastroenterology;  Laterality: N/A;  . EYE SURGERY     cataract surgery bilat   . IR GENERIC HISTORICAL  05/12/2016   IR US GUIDE VASC ACCESS RIGHT 05/12/2016 Greggory Keen, MD [facility]  . IR GENERIC HISTORICAL  05/12/2016   IR FLUORO GUIDE PORT INSERTION RIGHT 05/12/2016 Greggory Keen, MD [facility]  . IR REMOVAL TUN ACCESS W/ PORT W/O FL MOD SED  07/03/2017  . OSTOMY     urostomy  . TONSILLECTOMY AND ADENOIDECTOMY  Remote  . TRANSTHORACIC ECHOCARDIOGRAM  10/16/2017; 03/2019   10/2017 EF 65-70%, normal wall motion, grd I DD.  03/2019 same but also new moderate aortic stenosis. Rpt echo 1 yr.  .  TRANSURETHRAL RESECTION OF PROSTATE N/A 04/02/2016   Procedure: CYSTOSCOPY, TRANSURETHRAL RESECTION OF BLADDER TUMOR;  Surgeon: [name], MD;  Location: [facility];  Service: Urology;  Laterality: N/A;  . UMBILICAL HERNIA REPAIR  10/2016  . VASECTOMY      Allergies: Other  Medications: Prior to Admission medications   Medication Sig Start Date End Date Taking? Authorizing Provider  albuterol (VENTOLIN HFA) 108 (90 Base) MCG/ACT inhaler Inhale 2 puffs into the lungs every 6 (six) hours as needed. Patient taking differently: Inhale 2 puffs into the lungs every 6 (six) hours as needed for wheezing or shortness of breath.  07/22/19  Yes Saguier, Percell Miller, PA-C  cetirizine (ZYRTEC) 10 MG tablet Take 1 tablet (10 mg total) by mouth daily. Patient taking differently: Take 10 mg by mouth daily as needed for allergies or rhinitis.  02/18/19  Yes McGowen, [name], MD  Continuous Blood Gluc Sensor (FREESTYLE LIBRE 14 DAY SENSOR) MISC USE AS DIRECTED Patient taking differently: Inject 1 patch into the skin every 14 (fourteen) days.  04/05/19  Yes McGowen, [name], MD  Ferrous Sulfate  27 MG TABS Take 27 mg by mouth daily with breakfast.   Yes [provider]  Omega-3 Fatty Acids (FISH OIL PO) Take 1 capsule by mouth daily.    Yes [provider]  pravastatin (PRAVACHOL) 20 MG tablet TAKE 1 TABLET BY MOUTH EVERY DAY Patient taking differently: Take 20 mg by mouth daily.  10/31/19  Yes McGowen, [name], MD  aspirin EC 81 MG tablet Take 1 tablet (81 mg total) by mouth daily. Patient not taking: Reported on 04/08/2020 05/17/19   Tammi Sou, MD  azithromycin (ZITHROMAX) 250 MG tablet Take 2 tablets by mouth on day 1, followed by 1 tablet by mouth daily for 4 days. Patient not taking: Reported on 04/08/2020 07/22/19   Saguier, Percell Miller, PA-C  benzonatate (TESSALON) 100 MG capsule Take 1 capsule (100 mg total) by mouth 3 (three) times daily as needed for cough. Patient not taking: Reported on  04/08/2020 07/22/19   Saguier, Percell Miller, PA-C  FERROUS SULFATE PO Take 27 mg by mouth. Takes 1 daily Patient not taking: Reported on 04/08/2020    [provider]  insulin aspart (NOVOLOG FLEXPEN) 100 UNIT/ML FlexPen CBG < 70: implement hypoglycemia protocol CBG 70 - 120: 3 units CBG 121 - 150: 5 units CBG 151 - 200: 6 units CBG 201 - 250: 8 units CBG 251 - 300: 11 units CBG 301 - 350: 14 units CBG 351 - 400: 18 units CBG > 400: call MD 02/04/18   Tammi Sou, MD  Insulin Glargine (LANTUS SOLOSTAR) 100 UNIT/ML Solostar Pen INJECT 5 UNITS SUBQ AT BEDTIME Patient taking differently: Inject 5 Units into the skin at bedtime. INJECT 5 UNITS SUBQ AT BEDTIME 04/07/19   McGowen, [name], MD  Insulin Pen Needle (PEN NEEDLES) 32G X 4 MM MISC 1 each by Does not apply route daily. 10/07/17   McGowen, [name], MD  Insulin Pen Needle 31G X 5 MM MISC Inject insulin via insulin pen 3 x daily with meals 11/17/17   McGowen, [name], MD  Lancets 30G MISC Use to check blood sugar 4 times daily 01/21/18   McGowen, [name], MD  lisinopril (ZESTRIL) 20 MG tablet Take 1 tablet (20 mg total) by mouth 2 (two) times daily. Patient not taking: Reported on 04/08/2020 05/17/19   Tammi Sou, MD     Family History  Problem Relation Age of Onset  . Diabetes Mellitus I Mother   . Diabetes Mellitus I Sister   . Stroke Paternal Grandfather     Social History   Socioeconomic History  . Marital status: Married    Spouse name: Not on file  . Number of children: 3  . Years of education: Not on file  . Highest education level: Not on file  Occupational History  . Not on file  Tobacco Use  . Smoking status: Former Smoker    Years: 3.00  . Smokeless tobacco: Never Used  Vaping Use  . Vaping Use: Never used  Substance and Sexual Activity  . Alcohol use: No  . Drug use: No  . Sexual activity: Not on file  Other Topics Concern  . Not on file  Social History Narrative   Married, 3 children, 7  grandchildren.   Occupation: retired Engineer, site.   No tob, rare alc.   Social Determinants of Health   Financial Resource Strain:   . Difficulty of Paying Living Expenses: Not on file  Food Insecurity:   . Worried About Charity fundraiser  in the Last Year: Not on file  . Ran Out of Food in the Last Year: Not on file  Transportation Needs:   . Lack of Transportation (Medical): Not on file  . Lack of Transportation (Non-Medical): Not on file  Physical Activity:   . Days of Exercise per Week: Not on file  . Minutes of Exercise per Session: Not on file  Stress:   . Feeling of Stress : Not on file  Social Connections:   . Frequency of Communication with Friends and Family: Not on file  . Frequency of Social Gatherings with Friends and Family: Not on file  . Attends Religious Services: Not on file  . Active Member of Clubs or Organizations: Not on file  . Attends Archivist Meetings: Not on file  . Marital Status: Not on file     Review of Systems: A 12 point ROS discussed and pertinent positives are indicated in the HPI above.  All other systems are negative.  Review of Systems  Constitutional: Positive for fatigue. Negative for fever.  Respiratory: Negative for cough and shortness of breath.   Cardiovascular: Negative for chest pain.  Gastrointestinal: Negative for abdominal pain, diarrhea, nausea and vomiting.  Genitourinary: Positive for flank pain. Negative for difficulty urinating.  Musculoskeletal: Positive for back pain.  Psychiatric/Behavioral: Negative for behavioral problems and confusion.    Vital Signs: BP 114/62 (BP Location: Left Arm)   Pulse 64   Temp 98.4 F (36.9 C)   Resp 17   Ht 5\' 7"  (1.702 m)   Wt 187 lb 6.3 oz (85 kg)   SpO2 96%   BMI 29.35 kg/m   Physical Exam Vitals and nursing note reviewed.  Constitutional:      General: He is not in acute distress.    Appearance: Normal appearance. He is not ill-appearing.  HENT:     Mouth/Throat:      Mouth: Mucous membranes are moist.     Pharynx: Oropharynx is clear.  Cardiovascular:     Rate and Rhythm: Normal rate and regular rhythm.  Pulmonary:     Effort: Pulmonary effort is normal. No respiratory distress.     Breath sounds: Normal breath sounds.  Abdominal:     General: Abdomen is flat.     Palpations: Abdomen is soft.     Comments: Ileal conduit in place  Skin:    General: Skin is warm and dry.  Neurological:     General: No focal deficit present.     Mental Status: He is alert and oriented to person, place, and time. Mental status is at baseline.  Psychiatric:        Mood and Affect: Mood normal.        Behavior: Behavior normal.        Thought Content: Thought content normal.        Judgment: Judgment normal.      MD Evaluation Airway: WNL Heart: WNL Abdomen: WNL Chest/ Lungs: WNL ASA  Classification: 3 Mallampati/Airway Score: One   Imaging: CT Head Wo Contrast  Result Date: 04/08/2020 CLINICAL DATA:  Fall yesterday.  Fever and confusion today. EXAM: CT HEAD WITHOUT CONTRAST TECHNIQUE: Contiguous axial images were obtained from the base of the skull through the vertex without intravenous contrast. COMPARISON:  CT head 10/12/2017.  MRI brain 10/14/2017 FINDINGS: Brain: No evidence of acute infarction, hemorrhage, hydrocephalus, extra-axial collection or mass lesion/mass effect. Diffuse cerebral atrophy. Mild ventricular dilatation consistent with central atrophy. Low-attenuation changes in the deep  white matter consistent small vessel ischemia. Parenchymal calcifications in the basal ganglia and cerebellum. Vascular: Intracranial arterial vascular calcifications are present. Skull: Depression and deformity of the outer table of the right frontal sinus, new since previous study. This is consistent with a depression fracture. No significant overlying soft tissue swelling suggests this to represent an old rather than acute fracture. Calvarium appears otherwise  intact. Sinuses/Orbits: Paranasal sinuses and mastoid air cells are clear. Other: None. IMPRESSION: 1. No acute intracranial abnormalities. 2. Chronic atrophy and small vessel ischemic changes. 3. Depressed fracture deformity of the outer table of the right frontal sinus. No significant overlying soft tissue swelling and lack of fluid in the sinus suggests this to represent an old rather than acute fracture. Electronically Signed   By: Lucienne Capers M.D.   On: 04/08/2020 23:33   MR ABDOMEN WO CONTRAST  Result Date: 04/12/2020 CLINICAL DATA:  Renal mass, renal insufficiency EXAM: MRI ABDOMEN WITHOUT CONTRAST TECHNIQUE: Multiplanar multisequence MR imaging was performed without the administration of intravenous contrast. COMPARISON:  CT of the abdomen and pelvis dated December 19, 2017 FINDINGS: Lower chest: No sign of consolidation or pleural effusion. Limited assessment of lung bases on MRI. Hepatobiliary: Limited assessment on noncontrast imaging of the liver. No visible lesion with suspicious features. No pericholecystic stranding or sign of biliary duct dilation. Pancreas: 13 mm lesion in the head of the pancreas with cystic features. Mild main duct distension grossly similar to previous imaging with ectatic side branches. The cystic lesion in the head of the pancreas is unchanged compared to Dec 04, 2018. Loss of normal intrinsic T1 hyperintensity within the pancreatic parenchyma is similar to previous imaging Spleen: Spleen normal in size and contour without focal lesion. Limited assessment without contrast. Adrenals/Urinary Tract:  Adrenal glands are grossly normal. Moderate LEFT-sided hydroureteronephrosis. In the LEFT renal pelvis is an area of low T2 signal measuring approximately 10 x 6 mm. Extensive inflammatory changes surround the LEFT renal pelvis. There is distension of LEFT renal collecting systems as described, some of this distension showing intermediate rather than high T2 signal without high T1  signal and with signs of profound restricted diffusion. Mixed signal on diffusion is noted with a more central area in the renal sinus measuring approximately 3.1 x 0.9 cm displaying less diffusion than material that fills the peripheral collecting systems. Striated parenchymal signal on T2 weighted imaging and on diffusion RIGHT kidney with potential calculus in the lower pole best seen a susceptibility on image 25 of series 12. This is nonspecific. Mild fullness of the RIGHT ureter without overt hydronephrosis in the setting of ileal conduit following cysto prostatectomy. Stomach/Bowel: Limited assessment of gastrointestinal structures. RIGHT lower quadrant ileal conduit ostomy. Small bowel, likely a portion of the conduit contained within a small ventral hernia is imaged only on coronal images, grossly unchanged accounting for incomplete visualization as compared to December 19, 2017. Study not performed for bowel evaluation and therefore with limited assessment of bowel. Vascular/Lymphatic: Vascular structures in the abdomen not well assessed with signs of atheromatous plaque. Mildly enlarged lymph nodes along the LEFT periaortic chain, largest 13 mm on image 66 of series 10. Other: Generalized mild body wall edema. No visible ascites. No perinephric collection. Musculoskeletal: No focal, suspicious bone lesion visualized. IMPRESSION: 1. Interval migration of a calculus (approximately 10 x 6 mm) from the peripheral collecting systems of the LEFT kidney into the renal pelvis with surrounding inflammation and associated moderate hydronephrosis. 2. Material filling calices and collecting system elements  may represent tumor or infectious debris, urologic consultation with retrograde evaluation as warranted is suggested. Noncontrast CT could be performed for confirmation of renal pelvic calculus if desired. 3. Striated appearance of parenchyma on the LEFT on T2 weighted imaging suggests concomitant pyelonephritis,  combined with central obstructing calculus as described. 4. Mild fullness of the RIGHT ureter without overt hydronephrosis in the setting of ileal conduit following cysto prostatectomy. 5. Mildly enlarged LEFT periaortic lymph nodes, nonspecific. 6. 13 mm cystic lesion in the head of the pancreas with cystic features. This is unchanged compared to previous imaging and may represent a side branch IPMN. Attention on follow-up. MRCP at 2 year interval could be performed as warranted for further assessment These results will be called to the ordering clinician or representative by the Radiologist Assistant, and communication documented in the PACS or [employer]. Electronically Signed   By: Zetta Bills M.D.   On: 04/12/2020 08:18   US RENAL  Result Date: 04/11/2020 CLINICAL DATA:  Acute kidney injury. EXAM: RENAL / URINARY TRACT ULTRASOUND COMPLETE COMPARISON:  Dec 04, 2018.  December 19, 2017 FINDINGS: Right Kidney: Renal measurements: 11.5 x 5.5 x 5.0 cm = volume: 166 mL. Mild hydronephrosis is noted. Echogenicity within normal limits. No mass visualized. Left Kidney: Renal measurements: 11.8 x 6.7 x 6.0 cm = volume: 248 mL. Echogenicity within normal limits. Mild hydronephrosis is noted. Lobulated echogenic abnormality is noted in the left renal pelvis. It is nonshadowing and measures approximately 2.5 x 3.0 cm. Bladder: Surgically removed due to cancer. Other: None. IMPRESSION: 1. Mild bilateral hydronephrosis is noted. 2. 3 cm lobular echogenic abnormality is noted in the left renal pelvis which is concerning for possible mass or malignancy. CT scan or MRI scan with and without contrast administration is recommended for further evaluation. Electronically Signed   By: Marijo Conception M.D.   On: 04/11/2020 13:21   DG Chest Port 1 View  Result Date: 04/08/2020 CLINICAL DATA:  Question sepsis.  Fever today, confusion. EXAM: PORTABLE CHEST 1 VIEW COMPARISON:  10/12/2017 FINDINGS: The cardiomediastinal  contours are normal. Normal heart size. Aortic atherosclerosis. Subsegmental opacities at the left lung base. Pulmonary vasculature is normal. No pleural effusion or pneumothorax. No acute osseous abnormalities are seen. The bones are under mineralized. There is likely interposition of colon under the right hemidiaphragm. IMPRESSION: Subsegmental opacities at the left lung base, favor atelectasis or scarring. Airspace disease such as pneumonia is also considered in the setting of fever. Electronically Signed   By: Keith Rake M.D.   On: 04/08/2020 21:53    Labs:  CBC: Recent Labs    04/09/20 0725 04/10/20 0522 04/11/20 0054 04/12/20 0601  WBC 13.3* 11.1* 11.4* 8.8  HGB 10.6* 11.1* 10.7* 10.1*  HCT 33.0* 34.0* 32.3* 30.8*  PLT 260 234 259 230    COAGS: Recent Labs    04/08/20 2153 04/09/20 0725  INR 1.5* 1.5*  APTT 30  --     BMP: Recent Labs    04/09/20 0725 04/10/20 0522 04/11/20 0054 04/12/20 0601  NA 135 137 135 137  K 3.3* 3.1* 4.1 3.3*  CL 115* 110 107 104  CO2 11* 15* 16* 23  GLUCOSE 179* 153* 125* 129*  BUN 50* 46* 42* 37*  CALCIUM 7.8* 7.8* 7.8* 7.7*  CREATININE 2.52* 2.39* 2.36* 2.26*  GFRNONAA 23* 24* 25* 26*  GFRAA 26* 28* 29* 30*    LIVER FUNCTION TESTS: Recent Labs    04/08/20 2120 04/09/20 0725  BILITOT  0.5 0.5  AST 15 16  ALT 17 16  ALKPHOS 70 62  PROT 6.7 5.7*  ALBUMIN 2.9* 2.4*    TUMOR MARKERS: No results for input(s): AFPTM, CEA, CA199, CHROMGRNA in the last 8760 hours.  Assessment and Plan: L hydronephrosis, urosepsis Patient presented with UTI and pneumonia, found to have Enterococcus and pseudomonas positive blood cultures.  He denies any flank or back pain today but states he did have an episode of pain yesterday which prompted [place] and ultimately MRI identifying large renal calculi. IR now consulted for percutaneous nephrostomy tube placement.  He has been NPO as of 8AM.  Heparin SQ held.  Discussed with patient and  granddaughter at bedside who agree with procedure.   Risks and benefits of left PCN placement was discussed with the patient including, but not limited to, infection, bleeding, significant bleeding causing loss or decrease in renal function or damage to adjacent structures.   All of the patient's questions were answered, patient is agreeable to proceed.  Consent signed and in chart.  Thank you for this interesting consult.  I greatly enjoyed meeting Shane [name] and look forward to participating in their care.  A copy of this report was sent to the requesting provider on this date.  Electronically Signed: Docia Barrier, PA 04/12/2020, 1:32 PM   I spent a total of 40 Minutes    in face to face in clinical consultation, greater than 50% of which was counseling/coordinating care for left hydronephrosis.

## 2020-04-12 NOTE — Progress Notes (Addendum)
PROGRESS NOTE    Shane [name]  OVZ:[id number] DOB: 1935-11-21 DOA: 04/08/2020 PCP: Tammi Sou, MD   Brief Narrative:  Shane [name] is a 84 y.o. male with medical history significant of bladder cancer status post Ileal conduit, calcific pancreatitis, chronic kidney disease stage IIIa, coronary artery disease, aortic stenosis hypertension, hyperlipidemia, recurring UTIs, recurrent GI bleeds, history of CVA, diabetes, duodenal ulcer who presented to the ER with recurrent falls, fever, weakness and altered mental status.Patient apparently was trying to go to the bathroom yesterday when he had a fall. He had COVID-19 in January which he recovered from but has had 3 other episodes of pneumonia since then he has continued to be weak and debilitated.  His weakness gotten worse in the last 2 to 3 days.  He came to the ER where he was found to have Temperature 100.9 blood pressure 84/41 pulse 97 respirate 25 oxygen sat 96% room air.  Venous pH is 7.196.  Sodium 132 potassium 3.6 chloride 111 CO2 obtain glucose 200 BUN 54 creatinine 2.58 and calcium 7.9.  CK is 143 lactic acid 3.0 white count 12.8 hemoglobin 11.8.  Platelets 285.  PT 17.2 INR 1.5.  Urinalysis showed cloudy urine with many bacteria WBC more than 50 RBC 6-10 large leukocytes.  Chest x-ray showed subsegmental opacities at the left lung base probably atelectasis or scarring possible pneumonia.  Head CT without contrast showed no acute findings and met criteria for sepsis secondary to UTI and possible pneumonia.  He additionally has features of endorgan damage/AKI on CKD stage IIIa making him have severe sepsis.  He was admitted under hospital service.  Started on broad-spectrum IV antibiotics as well as IV fluids.  Subsequently his blood culture grew Enterococcus faecalis as well as Pseudomonas.  ID was consulted.  His antibiotics were switched to ampicillin and cefepime and then to Zosyn.  His creatinine improved but very slowly.   Further history was provided by the daughter that he was also complaining of left flank pain prior to his fall.  This prompted ultrasound kidney which showed suspicion of possible left renal mass.  Radiology recommended CT or MRI with and without contrast however due to elevated creatinine/AKI, we could not perform contrasted study.  I had a long discussion with radiology who recommended MRI without contrast which was done on 04/12/2020 which shows large left renal pelvis stone measuring 10 x 6 mm.  Causing hydronephrosis and pyelonephritis.  Urology was consulted.  Assessment & Plan:   Principal Problem:   Severe sepsis ([place]) Active Problems:   Hypertension   Diabetes mellitus without complication ([place])   UTI (urinary tract infection)   Malignant neoplasm of urinary bladder ([facility])   S/P ileal conduit (H[facility])   AKI (acute kidney injury) ([place])   Lobar pneumonia (H[facility])   #1 severe sepsis secondary to UTI/left pyelonephritis/community-acquired pneumonia/gram-negative bacteremia: Patient met severe sepsis criteria based on fever, leukocytosis and tachypnea and endorgan damage of acute renal failure and lactic acid of 3.  Continues to feel well.  Blood culture growing Enterococcus faecalis and Pseudomonas aeruginosa.  ID was also consulted. antibiotics were switched to ampicillin and cefepime and then to Zosyn on 04/11/2020.  Sepsis physiology is improving.  He has remained afebrile.  #2 acute kidney injury on chronic kidney disease stage IIIa/dehydration/left renal stone/left pyelonephritis/left hydronephrosis: Secondary to sepsis/ATN. Creatinine only slightly improved again today but not as much as I had expected.  After receiving more history from the daughter yesterday where she told  me that patient was complaining of left flank pain before the fall, ultrasound kidney was done which showed suspicion of left renal mass. Radiology recommended CT or MRI with and without contrast however due to elevated  creatinine/AKI, we could not perform contrasted study.  I had a long discussion with radiology who recommended MRI without contrast which was done on 04/12/2020 which shows large left renal pelvis stone measuring 10 x 6 mm.  Causing hydronephrosis and pyelonephritis.  I have consulted urology for this.  #3  Non-anion gap metabolic acidosis: Likely due to AKI.  Now resolved.  Will stop bicarb drip.  #4.  Hypokalemia: 3.3.  Replace orally.  #6 history of urinary bladder cancer: Patient has had surgery on treatment.  Defer to his oncologist.  #7 diabetes type II: Continue with sliding scale insulin and supportive care.  #8 hypertension: Controlled.  Patient takes only lisinopril at home which is on hold due to AKI.  #9 hypomagnesemia: Resolved  #10: Deconditioning: PT OT consulted  DVT prophylaxis: heparin injection 5,000 Units Start: 04/09/20 0715   Code Status: Full Code  Family Communication: Daughter present at bedside.  Plan of care discussed with patient in length with patient and he verbalized understanding and agreed with it.  Also answered several questions by daughter.  Status is: Inpatient  Remains inpatient appropriate because:Inpatient level of care appropriate due to severity of illness   Dispo: The patient is from: Home              Anticipated d/c is to: Home              Anticipated d/c date is: 2 days              Patient currently is not medically stable to d/c.        Estimated body mass index is 29.35 kg/m as calculated from the following:   Height as of this encounter: _0  (1.702 m).   Weight as of this encounter: 85 kg.      Nutritional status:  Nutrition Problem: Moderate Malnutrition Etiology: chronic illness (recurrent PNA/UTIs)   Signs/Symptoms: mild fat depletion, moderate muscle depletion   Interventions: Refer to RD note for recommendations    Consultants:   ID  Urology  Procedures:   None  Antimicrobials:   Anti-infectives (From admission, onward)   Start     Dose/Rate Route Frequency Ordered Stop   04/11/20 1800  piperacillin-tazobactam (ZOSYN) IVPB 3.375 g        3.375 g 12.5 mL/hr over 240 Minutes Intravenous Every 8 hours 04/11/20 1547     04/10/20 2000  ceFEPIme (MAXIPIME) 2 g in sodium chloride 0.9 % 100 mL IVPB  Status:  Discontinued        2 g 200 mL/hr over 30 Minutes Intravenous Every 24 hours 04/09/20 1806 04/11/20 1524   04/09/20 2130  ceFEPIme (MAXIPIME) 2 g in sodium chloride 0.9 % 100 mL IVPB  Status:  Discontinued        2 g 200 mL/hr over 30 Minutes Intravenous Every 24 hours 04/08/20 2207 04/09/20 0957   04/09/20 2000  ampicillin (OMNIPEN) 2 g in sodium chloride 0.9 % 100 mL IVPB  Status:  Discontinued        2 g 300 mL/hr over 20 Minutes Intravenous Every 8 hours 04/09/20 1806 04/11/20 1524   04/09/20 1900  ceFEPIme (MAXIPIME) 2 g in sodium chloride 0.9 % 100 mL IVPB        2 g  200 mL/hr over 30 Minutes Intravenous  Once 04/09/20 1806 04/09/20 2030   04/09/20 0715  cefTRIAXone (ROCEPHIN) 2 g in sodium chloride 0.9 % 100 mL IVPB  Status:  Discontinued        2 g 200 mL/hr over 30 Minutes Intravenous Every 24 hours 04/09/20 0711 04/09/20 1804   04/09/20 0715  azithromycin (ZITHROMAX) 500 mg in sodium chloride 0.9 % 250 mL IVPB  Status:  Discontinued        500 mg 250 mL/hr over 60 Minutes Intravenous Every 24 hours 04/09/20 0711 04/09/20 1804   04/08/20 2207  vancomycin variable dose per unstable renal function (pharmacist dosing)  Status:  Discontinued         Does not apply See admin instructions 04/08/20 2207 04/09/20 0956   04/08/20 2130  ceFEPIme (MAXIPIME) 2 g in sodium chloride 0.9 % 100 mL IVPB        2 g 200 mL/hr over 30 Minutes Intravenous  Once 04/08/20 2119 04/08/20 2213   04/08/20 2130  metroNIDAZOLE (FLAGYL) IVPB 500 mg        500 mg 100 mL/hr over 60 Minutes Intravenous  Once 04/08/20 2119 04/08/20 2231   04/08/20 2130  vancomycin (VANCOCIN) IVPB 1000  mg/200 mL premix  Status:  Discontinued        1,000 mg 200 mL/hr over 60 Minutes Intravenous  Once 04/08/20 2119 04/08/20 2126   04/08/20 2130  vancomycin (VANCOREADY) IVPB 1750 mg/350 mL        1,750 mg 175 mL/hr over 120 Minutes Intravenous  Once 04/08/20 2126 04/08/20 2351         Subjective: Seen and examined.  Daughter at the bedside he states that he feels much better.  He had no complaint today. Objective: Vitals:   04/11/20 0732 04/11/20 1635 04/11/20 1938 04/12/20 0738  BP: 123/84 118/68 107/70 114/62  Pulse: 71 69 70 64  Resp: _0 Temp: 99.4 F (37.4 C) 100.1 F (37.8 C) 99.2 F (37.3 C) 98.4 F (36.9 C)  TempSrc:   Oral   SpO2: 95% 98% 96% 96%  Weight:      Height:        Intake/Output Summary (Last 24 hours) at 04/12/2020 1011 Last data filed at 04/12/2020 0400 Gross per 24 hour  Intake 1531.25 ml  Output 1000 ml  Net 531.25 ml   Filed Weights   04/08/20 2104  Weight: 85 kg    Examination: General exam: Appears calm and comfortable  Respiratory system: Rhonchi bilaterally. Respiratory effort normal. Cardiovascular system: S1 & S2 heard, RRR. No JVD, murmurs, rubs, gallops or clicks. No pedal edema. Gastrointestinal system: Abdomen is nondistended, soft and nontender. No organomegaly or masses felt. Normal bowel sounds heard.  Urostomy bag in place with pink stoma.  No CVA tenderness Central nervous system: Alert and oriented. No focal neurological deficits. Extremities: Symmetric 5 x 5 power. Skin: No rashes, lesions or ulcers.  Psychiatry: Judgement and insight appear normal. Mood & affect appropriate.   Data Reviewed: I have personally reviewed following labs and imaging studies  CBC: Recent Labs  Lab 04/08/20 2120 04/08/20 2120 04/08/20 2303 04/09/20 0725 04/10/20 0522 04/11/20 0054 04/12/20 0601  WBC 12.8*  --   --  13.3* 11.1* 11.4* 8.8  NEUTROABS 11.8*  --   --   --  9.6* 10.1* 6.7  HGB 11.8*   < > 11.6* 10.6* 11.1* 10.7*  10.1*  HCT 37.3*   < > 34.0* 33.0*  34.0* 32.3* 30.8*  MCV 94.0  --   --  95.1 91.9 89.7 91.4  PLT 285  --   --  260 234 259 230   < > = values in this interval not displayed.   Basic Metabolic Panel: Recent Labs  Lab 04/08/20 2120 04/08/20 2120 04/08/20 2303 04/09/20 0725 04/10/20 0522 04/11/20 0054 04/12/20 0601  NA 134*   < > 141 135 137 135 137  K 3.6   < > 4.0 3.3* 3.1* 4.1 3.3*  CL 111  --   --  115* 110 107 104  CO2 10*  --   --  11* 15* 16* 23  GLUCOSE 200*  --   --  179* 153* 125* 129*  BUN 54*  --   --  50* 46* 42* 37*  CREATININE 2.58*  --   --  2.52* 2.39* 2.36* 2.26*  CALCIUM 7.9*  --   --  7.8* 7.8* 7.8* 7.7*  MG  --   --   --   --  1.5* 1.9  --    < > = values in this interval not displayed.   GFR: Estimated Creatinine Clearance: 26.3 mL/min (A) (by C-G formula based on SCr of 2.26 mg/dL (H)). Liver Function Tests: Recent Labs  Lab 04/08/20 2120 04/09/20 0725  AST 15 16  ALT 17 16  ALKPHOS 70 62  BILITOT 0.5 0.5  PROT 6.7 5.7*  ALBUMIN 2.9* 2.4*   No results for input(s): LIPASE, AMYLASE in the last 168 hours. No results for input(s): AMMONIA in the last 168 hours. Coagulation Profile: Recent Labs  Lab 04/08/20 2153 04/09/20 0725  INR 1.5* 1.5*   Cardiac Enzymes: Recent Labs  Lab 04/08/20 2120  CKTOTAL 143   BNP (last 3 results) No results for input(s): PROBNP in the last 8760 hours. HbA1C: No results for input(s): HGBA1C in the last 72 hours. CBG: Recent Labs  Lab 04/11/20 0734 04/11/20 1153 04/11/20 1634 04/11/20 1941 04/12/20 0740  GLUCAP 136* 132* 158* 134* 125*   Lipid Profile: No results for input(s): CHOL, HDL, LDLCALC, TRIG, CHOLHDL, LDLDIRECT in the last 72 hours. Thyroid Function Tests: No results for input(s): TSH, T4TOTAL, FREET4, T3FREE, THYROIDAB in the last 72 hours. Anemia Panel: No results for input(s): VITAMINB12, FOLATE, FERRITIN, TIBC, IRON, RETICCTPCT in the last 72 hours. Sepsis Labs: Recent Labs  Lab  04/08/20 2120 04/08/20 2250 04/09/20 0411 04/09/20 0725 04/09/20 0943  PROCALCITON  --   --   --  9.21  --   LATICACIDVEN 2.2* 3.0* 1.0  --  1.6    Recent Results (from the past 240 hour(s))  Respiratory Panel by RT PCR (Flu A&B, Covid) - Nasopharyngeal Swab     Status: None   Collection Time: 04/08/20  9:00 PM   Specimen: Nasopharyngeal Swab  Result Value Ref Range Status   SARS Coronavirus 2 by RT PCR NEGATIVE NEGATIVE Final    Comment: (NOTE) SARS-CoV-2 target nucleic acids are NOT DETECTED.  The SARS-CoV-2 RNA is generally detectable in upper respiratoy specimens during the acute phase of infection. The lowest concentration of SARS-CoV-2 viral copies this assay can detect is 131 copies/mL. A negative result does not preclude SARS-Cov-2 infection and should not be used as the sole basis for treatment or other patient management decisions. A negative result may occur with  improper specimen collection/handling, submission of specimen other than nasopharyngeal swab, presence of viral mutation(s) within the areas targeted by this assay, and inadequate number of viral  copies (<131 copies/mL). A negative result must be combined with clinical observations, patient history, and epidemiological information. The expected result is Negative.  Fact Sheet for Patients:  PinkCheek.be  Fact Sheet for Healthcare Providers:  GravelBags.it  This test is no t yet approved or cleared by the [place] FDA and  has been authorized for detection and/or diagnosis of SARS-CoV-2 by FDA under an Emergency Use Authorization (EUA). This EUA will remain  in effect (meaning this test can be used) for the duration of the COVID-19 declaration under Section 564(b)(1) of the Act, 21 U.S.C. section 360bbb-3(b)(1), unless the authorization is terminated or revoked sooner.     Influenza A by PCR NEGATIVE NEGATIVE Final   Influenza B by PCR  NEGATIVE NEGATIVE Final    Comment: (NOTE) The Xpert Xpress SARS-CoV-2/FLU/RSV assay is intended as an aid in  the diagnosis of influenza from Nasopharyngeal swab specimens and  should not be used as a sole basis for treatment. Nasal washings and  aspirates are unacceptable for Xpert Xpress SARS-CoV-2/FLU/RSV  testing.  Fact Sheet for Patients: PinkCheek.be  Fact Sheet for Healthcare Providers: GravelBags.it  This test is not yet approved or cleared by the [place] FDA and  has been authorized for detection and/or diagnosis of SARS-CoV-2 by  FDA under an Emergency Use Authorization (EUA). This EUA will remain  in effect (meaning this test can be used) for the duration of the  Covid-19 declaration under Section 564(b)(1) of the Act, 21  U.S.C. section 360bbb-3(b)(1), unless the authorization is  terminated or revoked. Performed at [facility], [place] [address], [place], [place] [address]   Blood Culture (routine x 2)     Status: Abnormal (Preliminary result)   Collection Time: 04/08/20  9:25 PM   Specimen: BLOOD LEFT ARM  Result Value Ref Range Status   Specimen Description BLOOD LEFT ARM  Final   Special Requests   Final    BOTTLES DRAWN AEROBIC AND ANAEROBIC Blood Culture adequate volume   Culture  Setup Time   Final    GRAM POSITIVE COCCI IN CHAINS GRAM NEGATIVE RODS AEROBIC BOTTLE ONLY CRITICAL RESULT CALLED TO, READ BACK BY AND VERIFIED WITH: PHARMD F WILSON 04/09/20 AT 66 SK GRAM POSITIVE RODS GRAM NEGATIVE RODS ANAEROBIC BOTTLE ONLY CRITICAL RESULT CALLED TO, READ BACK BY AND VERIFIED WITHTillman Sers PHARMD 1700 04/10/20 A BROWNING Performed at [facility], [place] [address] [place], [place] [address]    Culture ENTEROCOCCUS FAECALIS PSEUDOMONAS AERUGINOSA  (A)  Final   Report Status PENDING  Incomplete   Organism ID, Bacteria PSEUDOMONAS AERUGINOSA  Final   Organism ID, Bacteria ENTEROCOCCUS  FAECALIS  Final      Susceptibility   Enterococcus faecalis - MIC*    AMPICILLIN <=2 SENSITIVE Sensitive     VANCOMYCIN 2 SENSITIVE Sensitive     GENTAMICIN SYNERGY SENSITIVE Sensitive     * ENTEROCOCCUS FAECALIS   Pseudomonas aeruginosa - MIC*    CEFTAZIDIME 4 SENSITIVE Sensitive     CIPROFLOXACIN >=4 RESISTANT Resistant     GENTAMICIN <=1 SENSITIVE Sensitive     IMIPENEM 2 SENSITIVE Sensitive     PIP/TAZO 8 SENSITIVE Sensitive     CEFEPIME 2 SENSITIVE Sensitive     * PSEUDOMONAS AERUGINOSA  Urine culture     Status: Abnormal   Collection Time: 04/08/20  9:25 PM   Specimen: Urine, Clean Catch  Result Value Ref Range Status   Specimen Description URINE, CLEAN CATCH  Final  Special Requests   Final    NONE Performed at [facility], [place] [address] [place], [place] [address]    Culture MULTIPLE SPECIES PRESENT, SUGGEST RECOLLECTION (A)  Final   Report Status 04/10/2020 FINAL  Final  Blood Culture ID Panel (Reflexed)     Status: Abnormal   Collection Time: 04/08/20  9:25 PM  Result Value Ref Range Status   Enterococcus faecalis DETECTED (A) NOT DETECTED Final    Comment: CRITICAL RESULT CALLED TO, READ BACK BY AND VERIFIED WITH: PHARMD F WILSON 04/09/20 AT 1724 SK    Enterococcus Faecium NOT DETECTED NOT DETECTED Final   Listeria monocytogenes NOT DETECTED NOT DETECTED Final   Staphylococcus species NOT DETECTED NOT DETECTED Final   Staphylococcus aureus (BCID) NOT DETECTED NOT DETECTED Final   Staphylococcus epidermidis NOT DETECTED NOT DETECTED Final   Staphylococcus lugdunensis NOT DETECTED NOT DETECTED Final   Streptococcus species NOT DETECTED NOT DETECTED Final   Streptococcus agalactiae NOT DETECTED NOT DETECTED Final   Streptococcus pneumoniae NOT DETECTED NOT DETECTED Final   Streptococcus pyogenes NOT DETECTED NOT DETECTED Final   A.calcoaceticus-baumannii NOT DETECTED NOT DETECTED Final   Bacteroides fragilis NOT DETECTED NOT DETECTED Final    Enterobacterales NOT DETECTED NOT DETECTED Final   Enterobacter cloacae complex NOT DETECTED NOT DETECTED Final   Escherichia coli NOT DETECTED NOT DETECTED Final   Klebsiella aerogenes NOT DETECTED NOT DETECTED Final   Klebsiella oxytoca NOT DETECTED NOT DETECTED Final   Klebsiella pneumoniae NOT DETECTED NOT DETECTED Final   Proteus species NOT DETECTED NOT DETECTED Final   Salmonella species NOT DETECTED NOT DETECTED Final   Serratia marcescens NOT DETECTED NOT DETECTED Final   Haemophilus influenzae NOT DETECTED NOT DETECTED Final   Neisseria meningitidis NOT DETECTED NOT DETECTED Final   Pseudomonas aeruginosa DETECTED (A) NOT DETECTED Final    Comment: CRITICAL RESULT CALLED TO, READ BACK BY AND VERIFIED WITH: PHARMD F WILSON 04/09/20 AT 1724 SK    Stenotrophomonas maltophilia NOT DETECTED NOT DETECTED Final   Candida albicans NOT DETECTED NOT DETECTED Final   Candida auris NOT DETECTED NOT DETECTED Final   Candida glabrata NOT DETECTED NOT DETECTED Final   Candida krusei NOT DETECTED NOT DETECTED Final   Candida parapsilosis NOT DETECTED NOT DETECTED Final   Candida tropicalis NOT DETECTED NOT DETECTED Final   Cryptococcus neoformans/gattii NOT DETECTED NOT DETECTED Final   CTX-M ESBL NOT DETECTED NOT DETECTED Final   Carbapenem resistance IMP NOT DETECTED NOT DETECTED Final   Carbapenem resistance KPC NOT DETECTED NOT DETECTED Final   Carbapenem resistance NDM NOT DETECTED NOT DETECTED Final   Vancomycin resistance NOT DETECTED NOT DETECTED Final   Carbapenem resistance VIM NOT DETECTED NOT DETECTED Final    Comment: Performed at [facility], [address], [place], [place] [address]  Blood Culture (routine x 2)     Status: None (Preliminary result)   Collection Time: 04/08/20  9:34 PM   Specimen: BLOOD RIGHT HAND  Result Value Ref Range Status   Specimen Description BLOOD RIGHT HAND  Final   Special Requests   Final    BOTTLES DRAWN AEROBIC AND ANAEROBIC Blood  Culture results may not be optimal due to an inadequate volume of blood received in culture bottles   Culture   Final    NO GROWTH 3 DAYS Performed at [facility], [place] [address], [place], [place] [address]    Report Status PENDING  Incomplete  Radiology Studies: MR ABDOMEN WO CONTRAST  Result Date: 04/12/2020 CLINICAL DATA:  Renal mass, renal insufficiency EXAM: MRI ABDOMEN WITHOUT CONTRAST TECHNIQUE: Multiplanar multisequence MR imaging was performed without the administration of intravenous contrast. COMPARISON:  CT of the abdomen and pelvis dated December 19, 2017 FINDINGS: Lower chest: No sign of consolidation or pleural effusion. Limited assessment of lung bases on MRI. Hepatobiliary: Limited assessment on noncontrast imaging of the liver. No visible lesion with suspicious features. No pericholecystic stranding or sign of biliary duct dilation. Pancreas: 13 mm lesion in the head of the pancreas with cystic features. Mild main duct distension grossly similar to previous imaging with ectatic side branches. The cystic lesion in the head of the pancreas is unchanged compared to Dec 04, 2018. Loss of normal intrinsic T1 hyperintensity within the pancreatic parenchyma is similar to previous imaging Spleen: Spleen normal in size and contour without focal lesion. Limited assessment without contrast. Adrenals/Urinary Tract:  Adrenal glands are grossly normal. Moderate LEFT-sided hydroureteronephrosis. In the LEFT renal pelvis is an area of low T2 signal measuring approximately 10 x 6 mm. Extensive inflammatory changes surround the LEFT renal pelvis. There is distension of LEFT renal collecting systems as described, some of this distension showing intermediate rather than high T2 signal without high T1 signal and with signs of profound restricted diffusion. Mixed signal on diffusion is noted with a more central area in the renal sinus measuring approximately 3.1 x 0.9 cm displaying less diffusion than  material that fills the peripheral collecting systems. Striated parenchymal signal on T2 weighted imaging and on diffusion RIGHT kidney with potential calculus in the lower pole best seen a susceptibility on image 25 of series 12. This is nonspecific. Mild fullness of the RIGHT ureter without overt hydronephrosis in the setting of ileal conduit following cysto prostatectomy. Stomach/Bowel: Limited assessment of gastrointestinal structures. RIGHT lower quadrant ileal conduit ostomy. Small bowel, likely a portion of the conduit contained within a small ventral hernia is imaged only on coronal images, grossly unchanged accounting for incomplete visualization as compared to December 19, 2017. Study not performed for bowel evaluation and therefore with limited assessment of bowel. Vascular/Lymphatic: Vascular structures in the abdomen not well assessed with signs of atheromatous plaque. Mildly enlarged lymph nodes along the LEFT periaortic chain, largest 13 mm on image 66 of series 10. Other: Generalized mild body wall edema. No visible ascites. No perinephric collection. Musculoskeletal: No focal, suspicious bone lesion visualized. IMPRESSION: 1. Interval migration of a calculus (approximately 10 x 6 mm) from the peripheral collecting systems of the LEFT kidney into the renal pelvis with surrounding inflammation and associated moderate hydronephrosis. 2. Material filling calices and collecting system elements may represent tumor or infectious debris, urologic consultation with retrograde evaluation as warranted is suggested. Noncontrast CT could be performed for confirmation of renal pelvic calculus if desired. 3. Striated appearance of parenchyma on the LEFT on T2 weighted imaging suggests concomitant pyelonephritis, combined with central obstructing calculus as described. 4. Mild fullness of the RIGHT ureter without overt hydronephrosis in the setting of ileal conduit following cysto prostatectomy. 5. Mildly enlarged LEFT  periaortic lymph nodes, nonspecific. 6. 13 mm cystic lesion in the head of the pancreas with cystic features. This is unchanged compared to previous imaging and may represent a side branch IPMN. Attention on follow-up. MRCP at 2 year interval could be performed as warranted for further assessment These results will be called to the ordering clinician or representative by the Radiologist Assistant, and communication documented in  the PACS or [employer]. Electronically Signed   By: Zetta Bills M.D.   On: 04/12/2020 08:18   US RENAL  Result Date: 04/11/2020 CLINICAL DATA:  Acute kidney injury. EXAM: RENAL / URINARY TRACT ULTRASOUND COMPLETE COMPARISON:  Dec 04, 2018.  December 19, 2017 FINDINGS: Right Kidney: Renal measurements: 11.5 x 5.5 x 5.0 cm = volume: 166 mL. Mild hydronephrosis is noted. Echogenicity within normal limits. No mass visualized. Left Kidney: Renal measurements: 11.8 x 6.7 x 6.0 cm = volume: 248 mL. Echogenicity within normal limits. Mild hydronephrosis is noted. Lobulated echogenic abnormality is noted in the left renal pelvis. It is nonshadowing and measures approximately 2.5 x 3.0 cm. Bladder: Surgically removed due to cancer. Other: None. IMPRESSION: 1. Mild bilateral hydronephrosis is noted. 2. 3 cm lobular echogenic abnormality is noted in the left renal pelvis which is concerning for possible mass or malignancy. CT scan or MRI scan with and without contrast administration is recommended for further evaluation. Electronically Signed   By: Marijo Conception M.D.   On: 04/11/2020 13:21    Scheduled Meds: . feeding supplement (GLUCERNA SHAKE)  237 mL Oral TID BM  . ferrous sulfate  325 mg Oral Q breakfast  . heparin  5,000 Units Subcutaneous Q8H  . insulin aspart  0-5 Units Subcutaneous QHS  . insulin aspart  0-9 Units Subcutaneous TID WC  . insulin glargine  5 Units Subcutaneous QHS  . loratadine  10 mg Oral Daily  . multivitamin with minerals  1 tablet Oral Daily  . omega-3  acid ethyl esters  1 g Oral BID  . potassium chloride  40 mEq Oral Q4H  . pravastatin  20 mg Oral Daily   Continuous Infusions: . sodium chloride 75 mL/hr at 04/11/20 1139  . piperacillin-tazobactam (ZOSYN)  IV 3.375 g (04/12/20 0817)  . sodium bicarbonate (isotonic) 150 mEq in D5W 1000 mL infusion 50 mL/hr at 04/12/20 0015     LOS: 3 days   Time spent: 32 minutes   Darliss Cheney, MD [facility]  04/12/2020, 10:11 AM   To contact the attending provider between 7A-7P or the covering provider during after hours 7P-7A, please log into the web site [email or web address].

## 2020-04-12 NOTE — TOC Initial Note (Signed)
Transition of Care [facility]) - Initial/Assessment Note    Patient Details  Name: Shane [name] MRN: [id number] Date of Birth: August 25, 1935  Transition of Care [facility]) CM/SW Contact:    Shane Chars, LCSW Phone Number: 04/12/2020, 2:13 PM  Clinical Narrative:  CSW met with pt to discuss DC plan.  Pt declines having [place] services arranged and declines outpt PT services as well.  Pt reports he feels better, stronger, and does not think more PT is needed at this time.  Pt lives with daughter and her family and permission given to speak with her Shane [name]).  Pt is vaccinated.  Pt reports he has a walker and a cane at home that he does not use and does not need any equipment.  PCP has recently changed and he could not recall name of his new MD, but reports it is same office.  1400:  CSW spoke with Shane [name], daughter, about pt declining services.  She will be back at the hospital shortly and will discuss with pt.  Pt has remote health PCP services currently in place and they had arranged HHPT recently and pt only participated in a few sessions before declining more.                   Expected Discharge Plan: Home/Self Care Barriers to Discharge: Continued Medical Work up   Patient Goals and CMS Choice Patient states their goals for this hospitalization and ongoing recovery are:: stay mobile      Expected Discharge Plan and Services Expected Discharge Plan: Home/Self Care     Post Acute Care Choice:  (Pt declines post acute services today) Living arrangements for the past 2 months: Single Family Home                                      Prior Living Arrangements/Services Living arrangements for the past 2 months: Single Family Home Lives with:: Adult Children, Relatives (daughter, son in law, and their children) Patient language and need for interpreter reviewed:: Yes Do you feel safe going back to the place where you live?: Yes      Need for Family Participation in Patient  Care: No (Comment) Care giver support system in place?: Yes (comment) Current home services: Other (comment) (remote health/PCP services) Criminal Activity/Legal Involvement Pertinent to Current Situation/Hospitalization: No - Comment as needed  Activities of Daily Living      Permission Sought/Granted Permission sought to share information with : Family Supports Permission granted to share information with : Yes, Verbal Permission Granted  Share Information with NAME: Daughter Shane [name]           Emotional Assessment Appearance:: Appears stated age Attitude/Demeanor/Rapport: Engaged Affect (typically observed): Pleasant Orientation: : Oriented to Self, Oriented to Place, Oriented to  Time, Oriented to Situation Alcohol / Substance Use: Not Applicable Psych Involvement: No (comment)  Admission diagnosis:  Lactic acidosis [X38.1] Metabolic acidosis [W29.9] Acute kidney injury ([place]) [N17.9] Fall, initial encounter [W19.XXXA] Severe sepsis ([place]) [A41.9, R65.20] History of urostomy [Z98.890] Fever, unspecified fever cause [R50.9] Urinary tract infection without hematuria, site unspecified [N39.0] Sepsis, due to unspecified organism, unspecified whether acute organ dysfunction present [facility]) [A41.9] Patient Active Problem List   Diagnosis Date Noted  . AKI (acute kidney injury) ([place]) 04/09/2020  . Severe sepsis ([place]) 04/09/2020  . Lobar pneumonia ([place]) 04/09/2020  . Melena   . Duodenal ulcer   . GI  bleed 12/17/2017  . Acute blood loss anemia 12/17/2017  . Cerebral thrombosis with cerebral infarction 10/15/2017  . Malnutrition of moderate degree 10/13/2017  . DKA (diabetic ketoacidoses) ([place]) 10/12/2017  . Port catheter in place 02/25/2017  . S/P ileal conduit ([place]) 10/17/2016  . Malignant neoplasm of urinary bladder ([place]) 05/01/2016  . Diabetes mellitus without complication ([place]) 53/96/7289  . UTI (urinary tract infection) 04/01/2016  . Hypertension   . Hematoma of bladder  wall   . Hematuria 03/31/2016   PCP:  Shane Sou, MD Pharmacy:   [id number] [place] [address]Phone: [phone]: [phone]    Social Determinants of Health (SDOH) Interventions    Readmission Risk Interventions No flowsheet data found.

## 2020-04-12 NOTE — Evaluation (Signed)
Physical Therapy Evaluation Patient Details Name: Shane [name] MRN: [id number] DOB: [birth year] Today's Date: 04/12/2020   History of Present Illness  84yo male who recently fell at home; recent history of Covid January 2021 and has been weak ever since but it has been getting worse. CTH negative, but imaging did find L renal mass pending w/u. Admitted with severe sepsis secondary to UTI. PMH moderate aortic stenosis, HTN, HLD, hx Bells palsy, DM, CVA,  bladder cancer, cardiac cath, urostomy  Clinical Impression   Patient received in bed, very pleasant and cooperative but very talkative; daughter also present and assisted in provided PLOF information. See below for mobility levels. Definitely weak overall, mildly unsteady over even surfaces with no AD and certainly would be a high fall risk for mobility on stairs and uneven terrain which he commonly has to navigate at home. Spent quite a bit of time educating on and encouraging/reinforcing importance of regular activity and compliance with HEP given to him by past therapists, daughter in full agreement and supportive. Left in chair with MD attending, daughter present and RN aware of patient status. Will benefit from skilled HHPT services moving forward.     Follow Up Recommendations Home health PT;Supervision for mobility/OOB    Equipment Recommendations  Other (comment) (well equipped)    Recommendations for Other Services       Precautions / Restrictions Precautions Precautions: Fall Precaution Comments: urostomy R abdomen Restrictions Weight Bearing Restrictions: No      Mobility  Bed Mobility Overal bed mobility: Needs Assistance Bed Mobility: Supine to Sit     Supine to sit: Supervision     General bed mobility comments: S for line management/safety  Transfers Overall transfer level: Needs assistance Equipment used: None Transfers: Sit to/from Stand Sit to Stand: Min guard         General transfer comment:  min guard for safety, mildly unsteady but able to correct  Ambulation/Gait Ambulation/Gait assistance: Min guard Gait Distance (Feet): 150 Feet Assistive device: Rolling walker (2 wheeled) Gait Pattern/deviations: Step-through pattern;Wide base of support;Drifts right/left;Trunk flexed Gait velocity: decreased   General Gait Details: slow and mildly unteady without device but able to maintain balance with min guard  Stairs            Wheelchair Mobility    Modified Rankin (Stroke Patients Only)       Balance Overall balance assessment: History of Falls;Needs assistance Sitting-balance support: Feet supported Sitting balance-Leahy Scale: Good     Standing balance support: No upper extremity supported;During functional activity Standing balance-Leahy Scale: Fair                               Pertinent Vitals/Pain Pain Assessment: No/denies pain    Home Living Family/patient expects to be discharged to:: Private residence Living Arrangements: Spouse/significant other;Children;Other (Comment) (daughter and son-in law, wife, grandkids) Available Help at Discharge: Family;Available 24 hours/day Type of Home: House Home Access: Stairs to enter Entrance Stairs-Rails: Psychiatric nurse of Steps: 20 steps at front with B railings (cannot reach both at same time), 3 in back with no rails Home Layout: Multi-level Home Equipment: Walker - 2 wheels;Grab bars - toilet;Bedside commode;Other (comment) (walking stick for outside) Additional Comments: has equipment but doesn't really use it    Prior Function Level of Independence: Independent with assistive device(s)         Comments: no device inside/walking stick outside but does have history of  falls; does sometimes need help with urostomy when not feeling well     Hand Dominance        Extremity/Trunk Assessment   Upper Extremity Assessment Upper Extremity Assessment: Defer to OT  evaluation    Lower Extremity Assessment Lower Extremity Assessment: Generalized weakness    Cervical / Trunk Assessment Cervical / Trunk Assessment: Kyphotic  Communication   Communication: No difficulties  Cognition Arousal/Alertness: Awake/alert Behavior During Therapy: WFL for tasks assessed/performed Overall Cognitive Status: Within Functional Limits for tasks assessed                                        General Comments      Exercises     Assessment/Plan    PT Assessment Patient needs continued PT services  PT Problem List Decreased strength;Obesity;Decreased activity tolerance;Decreased safety awareness;Decreased balance;Decreased knowledge of precautions;Decreased mobility;Decreased coordination       PT Treatment Interventions DME instruction;Balance training;Gait training;Stair training;Functional mobility training;Patient/family education;Therapeutic activities;Therapeutic exercise    PT Goals (Current goals can be found in the Care Plan section)  Acute Rehab PT Goals Patient Stated Goal: go homewhen medically ready PT Goal Formulation: With patient/family Time For Goal Achievement: 04/26/20 Potential to Achieve Goals: Good    Frequency Min 3X/week   Barriers to discharge        Co-evaluation               AM-PAC PT "6 Clicks" Mobility  Outcome Measure Help needed turning from your back to your side while in a flat bed without using bedrails?: None Help needed moving from lying on your back to sitting on the side of a flat bed without using bedrails?: A Little Help needed moving to and from a bed to a chair (including a wheelchair)?: A Little Help needed standing up from a chair using your arms (e.g., wheelchair or bedside chair)?: A Little Help needed to walk in hospital room?: A Little Help needed climbing 3-5 steps with a railing? : A Lot 6 Click Score: 18    End of Session Equipment Utilized During Treatment: Gait  belt Activity Tolerance: Patient tolerated treatment well Patient left: in chair;with call bell/phone within reach;with family/visitor present;Other (comment) (urologist attending) Nurse Communication: Mobility status PT Visit Diagnosis: Unsteadiness on feet (R26.81);Difficulty in walking, not elsewhere classified (R26.2);History of falling (Z91.81)    Time: 1100-1148 PT Time Calculation (min) (ACUTE ONLY): 48 min   Charges:   PT Evaluation $PT Eval Moderate Complexity: 1 Mod PT Treatments $Gait Training: 8-22 mins $Self Care/Home Management: 8-22        Windell Norfolk, DPT, PN1   Supplemental Physical Therapist [place]    Pager [phone] Acute Rehab Office [phone]

## 2020-04-12 NOTE — Progress Notes (Signed)
Cheryl from radiology called and gave results on Pt The renal calculus has moved from the peripheral system to the renal pelvis with moderate hydronephrosis. CT with contrast is allowed at this time if needed.

## 2020-04-13 ENCOUNTER — Inpatient Hospital Stay (HOSPITAL_COMMUNITY): Payer: Medicare Other

## 2020-04-13 HISTORY — PX: IR NEPHROSTOMY PLACEMENT LEFT: IMG6063

## 2020-04-13 LAB — CBC WITH DIFFERENTIAL/PLATELET
Abs Immature Granulocytes: 0.06 10*3/uL (ref 0.00–0.07)
Basophils Absolute: 0.1 10*3/uL (ref 0.0–0.1)
Basophils Relative: 1 %
Eosinophils Absolute: 0.2 10*3/uL (ref 0.0–0.5)
Eosinophils Relative: 2 %
HCT: 32.3 % — ABNORMAL LOW (ref 39.0–52.0)
Hemoglobin: 10.4 g/dL — ABNORMAL LOW (ref 13.0–17.0)
Immature Granulocytes: 1 %
Lymphocytes Relative: 20 %
Lymphs Abs: 1.7 10*3/uL (ref 0.7–4.0)
MCH: 29.7 pg (ref 26.0–34.0)
MCHC: 32.2 g/dL (ref 30.0–36.0)
MCV: 92.3 fL (ref 80.0–100.0)
Monocytes Absolute: 0.7 10*3/uL (ref 0.1–1.0)
Monocytes Relative: 8 %
Neutro Abs: 5.9 10*3/uL (ref 1.7–7.7)
Neutrophils Relative %: 68 %
Platelets: 239 10*3/uL (ref 150–400)
RBC: 3.5 MIL/uL — ABNORMAL LOW (ref 4.22–5.81)
RDW: 14.9 % (ref 11.5–15.5)
WBC: 8.4 10*3/uL (ref 4.0–10.5)
nRBC: 0 % (ref 0.0–0.2)

## 2020-04-13 LAB — BASIC METABOLIC PANEL
Anion gap: 10 (ref 5–15)
BUN: 38 mg/dL — ABNORMAL HIGH (ref 8–23)
CO2: 22 mmol/L (ref 22–32)
Calcium: 7.8 mg/dL — ABNORMAL LOW (ref 8.9–10.3)
Chloride: 105 mmol/L (ref 98–111)
Creatinine, Ser: 2.41 mg/dL — ABNORMAL HIGH (ref 0.61–1.24)
GFR calc Af Amer: 28 mL/min — ABNORMAL LOW (ref >60)
GFR calc non Af Amer: 24 mL/min — ABNORMAL LOW (ref >60)
Glucose, Bld: 98 mg/dL (ref 70–99)
Potassium: 4.4 mmol/L (ref 3.5–5.1)
Sodium: 137 mmol/L (ref 135–145)

## 2020-04-13 LAB — GLUCOSE, CAPILLARY
Glucose-Capillary: 83 mg/dL (ref 70–99)
Glucose-Capillary: 93 mg/dL (ref 70–99)
Glucose-Capillary: 105 mg/dL — ABNORMAL HIGH (ref 70–99)
Glucose-Capillary: 185 mg/dL — ABNORMAL HIGH (ref 70–99)

## 2020-04-13 LAB — CULTURE, BLOOD (ROUTINE X 2): Culture: NO GROWTH

## 2020-04-13 MED ORDER — SODIUM CHLORIDE 0.9% FLUSH
5.0000 mL | Freq: Three times a day (TID) | INTRAVENOUS | Status: DC
  Administered 2020-04-13 – 2020-04-17 (×9): 5 mL

## 2020-04-13 MED ORDER — LIDOCAINE HCL 1 % IJ SOLN
INTRAMUSCULAR | Status: AC | PRN
  Administered 2020-04-13: 30 mL via INTRADERMAL

## 2020-04-13 MED ORDER — LIDOCAINE HCL (PF) 1 % IJ SOLN
INTRAMUSCULAR | Status: AC
  Filled 2020-04-13: qty 30

## 2020-04-13 MED ORDER — IOHEXOL 300 MG/ML  SOLN
50.0000 mL | Freq: Once | INTRAMUSCULAR | Status: AC | PRN
  Administered 2020-04-13: 10 mL

## 2020-04-13 MED ORDER — FENTANYL CITRATE (PF) 100 MCG/2ML IJ SOLN
INTRAMUSCULAR | Status: AC | PRN
  Administered 2020-04-13: 25 ug via INTRAVENOUS
  Administered 2020-04-13: 50 ug via INTRAVENOUS

## 2020-04-13 MED ORDER — MIDAZOLAM HCL 2 MG/2ML IJ SOLN
INTRAMUSCULAR | Status: AC
  Filled 2020-04-13: qty 2

## 2020-04-13 MED ORDER — CIPROFLOXACIN IN D5W 400 MG/200ML IV SOLN
INTRAVENOUS | Status: AC
  Filled 2020-04-13: qty 200

## 2020-04-13 MED ORDER — FENTANYL CITRATE (PF) 100 MCG/2ML IJ SOLN
INTRAMUSCULAR | Status: AC
Start: 2020-04-13 — End: 2020-04-13
  Filled 2020-04-13: qty 2

## 2020-04-13 MED ORDER — HYDROMORPHONE HCL 1 MG/ML IJ SOLN
0.5000 mg | INTRAMUSCULAR | Status: DC | PRN
  Administered 2020-04-13: 0.5 mg via INTRAVENOUS
  Filled 2020-04-13: qty 1

## 2020-04-13 MED ORDER — MIDAZOLAM HCL 2 MG/2ML IJ SOLN
INTRAMUSCULAR | Status: AC | PRN
  Administered 2020-04-13: 1 mg via INTRAVENOUS

## 2020-04-13 NOTE — Progress Notes (Signed)
OT Cancellation Note  Patient Details Name: Shane [name] MRN: [id number] DOB: Dec 04, 1935   Cancelled Treatment:    Reason Eval/Treat Not Completed: Patient at procedure or test/ unavailable. Will follow and see as able.   Jolaine Artist, OT Acute Rehabilitation Services Pager [phone] Office [phone]   Delight Stare 04/13/2020, 8:50 AM

## 2020-04-13 NOTE — Progress Notes (Signed)
°PROGRESS NOTE ° ° ° °Shane [name]  MRN:[id number] DOB: 04/11/1936 DOA: 04/08/2020 °PCP: McGowen, Philip H, MD ° ° °Brief Narrative:  °Shane [name] is a 84 y.o. male with medical history significant of bladder cancer status post Ileal conduit, calcific pancreatitis, chronic kidney disease stage IIIa, coronary artery disease, aortic stenosis hypertension, hyperlipidemia, recurring UTIs, recurrent GI bleeds, history of CVA, diabetes, duodenal ulcer who presented to the ER with recurrent falls, fever, weakness and altered mental status.Patient apparently was trying to go to the bathroom yesterday when he had a fall. He had COVID-19 in January which he recovered from but has had 3 other episodes of pneumonia since then he has continued to be weak and debilitated.  His weakness gotten worse in the last 2 to 3 days.  He came to the ER where he was found to have Temperature 100.9 blood pressure 84/41 pulse 97 respirate 25 oxygen sat 96% room air.  Venous pH is 7.196.  Sodium 132 potassium 3.6 chloride 111 CO2 obtain glucose 200 BUN 54 creatinine 2.58 and calcium 7.9.  CK is 143 lactic acid 3.0 white count 12.8 hemoglobin 11.8.  Platelets 285.  PT 17.2 INR 1.5.  Urinalysis showed cloudy urine with many bacteria WBC more than 50 RBC 6-10 large leukocytes.  Chest x-ray showed subsegmental opacities at the left lung base probably atelectasis or scarring possible pneumonia.  Head CT without contrast showed no acute findings and met criteria for sepsis secondary to UTI and possible pneumonia.  He additionally has features of endorgan damage/AKI on CKD stage IIIa making him have severe sepsis.  He was admitted under hospital service.  Started on broad-spectrum IV antibiotics as well as IV fluids.  Subsequently his blood culture grew Enterococcus faecalis as well as Pseudomonas.  ID was consulted.  His antibiotics were switched to ampicillin and cefepime and then to Zosyn.  His creatinine improved but very slowly.   Further history was provided by the daughter that he was also complaining of left flank pain prior to his fall.  This prompted ultrasound kidney which showed suspicion of possible left renal mass.  Radiology recommended CT or MRI with and without contrast however due to elevated creatinine/AKI, we could not perform contrasted study.  I had a long discussion with radiology who recommended MRI without contrast which was done on 04/12/2020 which shows large left renal pelvis stone measuring 10 x 6 mm.  Causing hydronephrosis and pyelonephritis.  Urology was consulted. °  °Assessment & Plan: °  °Principal Problem: °  Severe sepsis (HCC) °Active Problems: °  Hypertension °  Diabetes mellitus without complication (HCC) °  UTI (urinary tract infection) °  Malignant neoplasm of urinary bladder (HCC) °  S/P ileal conduit (HCC) °  AKI (acute kidney injury) (HCC) °  Lobar pneumonia (HCC) ° ° °#1 severe sepsis secondary to UTI/left pyelonephritis/community-acquired pneumonia/gram-negative bacteremia: Patient met severe sepsis criteria based on fever, leukocytosis and tachypnea and endorgan damage of acute renal failure and lactic acid of 3.  Blood culture growing Enterococcus faecalis and Pseudomonas aeruginosa.  ID was also consulted. antibiotics were switched to ampicillin and cefepime and then to Zosyn on 04/11/2020.  Sepsis physiology is improving.  S/p left PCN today 04/13/2020 by IR. °  °#2 acute kidney injury on chronic kidney disease stage IIIa/dehydration/left renal stone/left pyelonephritis/left hydronephrosis: Secondary to sepsis/ATN. Creatinine only slightly improved again today but not as much as I had expected.  After receiving more history from the daughter yesterday where she told me that   patient was complaining of left flank pain before the fall, ultrasound kidney was done which showed suspicion of left renal mass. Radiology recommended CT or MRI with and without contrast however due to elevated creatinine/AKI, we  could not perform contrasted study.  I had a long discussion with radiology who recommended MRI without contrast which was done on 04/12/2020 which shows large left renal pelvis stone measuring 10 x 6 mm.  Causing hydronephrosis and pyelonephritis.  Creatinine 2.4, slightly higher today.  Now s/p left PCN by IR.  Hopefully we will see improvement in his creatinine tomorrow.  Will follow fluid culture taken from PCN today. °  °#3  Non-anion gap metabolic acidosis: Likely due to AKI.  Now resolved.  °  °#4.  Hypokalemia: Resolved ° °#6 history of urinary bladder cancer: Patient has had surgery on treatment.  Defer to his oncologist. °  °#7 diabetes type II: Continue with sliding scale insulin and supportive care. °  °#8 hypertension: Controlled.  Patient takes only lisinopril at home which is on hold due to AKI. ° °#9 hypomagnesemia: Resolved ° °#10: Deconditioning: PT OT consulted ° °DVT prophylaxis: heparin injection 5,000 Units Start: 04/09/20 0715 °  Code Status: Full Code  °Family Communication: Wife present at bedside.  Plan of care discussed with patient in length with patient and he verbalized understanding and agreed with it.  ° °Status is: Inpatient ° °Remains inpatient appropriate because:Inpatient level of care appropriate due to severity of illness ° ° °Dispo: The patient is from: Home °             Anticipated d/c is to: Home °             Anticipated d/c date is: 2 days °             Patient currently is not medically stable to d/c. ° ° ° ° ° ° ° °Estimated body mass index is 29.35 kg/m² as calculated from the following: °  Height as of this encounter: 5' 7" (1.702 m). °  Weight as of this encounter: 85 kg. ° °  ° ° °Nutritional status: ° °Nutrition Problem: Moderate Malnutrition °Etiology: chronic illness (recurrent PNA/UTIs) ° ° °Signs/Symptoms: mild fat depletion, moderate muscle depletion ° ° °Interventions: Refer to RD note for recommendations ° ° ° °Consultants:  °· ID °· Urology ° °Procedures:   °Left PCN ° °Antimicrobials:  °Anti-infectives (From admission, onward)  ° Start     Dose/Rate Route Frequency Ordered Stop  ° 04/13/20 0922  ciprofloxacin (CIPRO) 400 MG/200ML IVPB  Status:  Discontinued       °Note to Pharmacy: Ousterman, Kyle   : cabinet override  °    04/13/20 0922 04/13/20 1147  ° 04/11/20 1800  piperacillin-tazobactam (ZOSYN) IVPB 3.375 g       ° 3.375 g °12.5 mL/hr over 240 Minutes Intravenous Every 8 hours 04/11/20 1547    ° 04/10/20 2000  ceFEPIme (MAXIPIME) 2 g in sodium chloride 0.9 % 100 mL IVPB  Status:  Discontinued       ° 2 g °200 mL/hr over 30 Minutes Intravenous Every 24 hours 04/09/20 1806 04/11/20 1524  ° 04/09/20 2130  ceFEPIme (MAXIPIME) 2 g in sodium chloride 0.9 % 100 mL IVPB  Status:  Discontinued       ° 2 g °200 mL/hr over 30 Minutes Intravenous Every 24 hours 04/08/20 2207 04/09/20 0957  ° 04/09/20 2000  ampicillin (OMNIPEN) 2 g in sodium chloride 0.9 % 100 mL IVPB  Status:    Discontinued       ° 2 g °300 mL/hr over 20 Minutes Intravenous Every 8 hours 04/09/20 1806 04/11/20 1524  ° 04/09/20 1900  ceFEPIme (MAXIPIME) 2 g in sodium chloride 0.9 % 100 mL IVPB       ° 2 g °200 mL/hr over 30 Minutes Intravenous  Once 04/09/20 1806 04/09/20 2030  ° 04/09/20 0715  cefTRIAXone (ROCEPHIN) 2 g in sodium chloride 0.9 % 100 mL IVPB  Status:  Discontinued       ° 2 g °200 mL/hr over 30 Minutes Intravenous Every 24 hours 04/09/20 0711 04/09/20 1804  ° 04/09/20 0715  azithromycin (ZITHROMAX) 500 mg in sodium chloride 0.9 % 250 mL IVPB  Status:  Discontinued       ° 500 mg °250 mL/hr over 60 Minutes Intravenous Every 24 hours 04/09/20 0711 04/09/20 1804  ° 04/08/20 2207  vancomycin variable dose per unstable renal function (pharmacist dosing)  Status:  Discontinued       °  Does not apply See admin instructions 04/08/20 2207 04/09/20 0956  ° 04/08/20 2130  ceFEPIme (MAXIPIME) 2 g in sodium chloride 0.9 % 100 mL IVPB       ° 2 g °200 mL/hr over 30 Minutes Intravenous  Once 04/08/20 2119  04/08/20 2213  ° 04/08/20 2130  metroNIDAZOLE (FLAGYL) IVPB 500 mg       ° 500 mg °100 mL/hr over 60 Minutes Intravenous  Once 04/08/20 2119 04/08/20 2231  ° 04/08/20 2130  vancomycin (VANCOCIN) IVPB 1000 mg/200 mL premix  Status:  Discontinued       ° 1,000 mg °200 mL/hr over 60 Minutes Intravenous  Once 04/08/20 2119 04/08/20 2126  ° 04/08/20 2130  vancomycin (VANCOREADY) IVPB 1750 mg/350 mL       ° 1,750 mg °175 mL/hr over 120 Minutes Intravenous  Once 04/08/20 2126 04/08/20 2351  °  °  ° ° ° °Subjective: °Seen and examined after the procedure.  Slightly groggy and complaining of some pain.  No other complaint. ° °Objective: °Vitals:  ° 04/13/20 0945 04/13/20 0950 04/13/20 0955 04/13/20 1011  °BP: 106/66 114/68 101/64 117/67  °Pulse: 62 63 70 70  °Resp: 14 14 (!) 24 14  °Temp:      °TempSrc:      °SpO2: 100% 100% 99% 96%  °Weight:      °Height:      ° ° °Intake/Output Summary (Last 24 hours) at 04/13/2020 1451 °Last data filed at 04/13/2020 0900 °Gross per 24 hour  °Intake 162.5 ml  °Output 2612 ml  °Net -2449.5 ml  ° °Filed Weights  ° 04/08/20 2104  °Weight: 85 kg  ° ° °Examination: °General exam: Appears calm and comfortable  °Respiratory system: Clear to auscultation. Respiratory effort normal. °Cardiovascular system: S1 & S2 heard, RRR. No JVD, murmurs, rubs, gallops or clicks. No pedal edema. °Gastrointestinal system: Abdomen is nondistended, soft and nontender. No organomegaly or masses felt. Normal bowel sounds heard.  Left nephrostomy. °Central nervous system: Alert and oriented. No focal neurological deficits. °Extremities: Symmetric 5 x 5 power. °Skin: No rashes, lesions or ulcers.  °Psychiatry: Judgement and insight appear normal. Mood & affect appropriate.  ° °Data Reviewed: I have personally reviewed following labs and imaging studies ° °CBC: °Recent Labs  °Lab 04/08/20 °2120 04/08/20 °2303 04/09/20 °0725 04/10/20 °0522 04/11/20 °0054 04/12/20 °0601 04/13/20 °0150  °WBC 12.8*   < > 13.3* 11.1* 11.4*  8.8 8.4  °NEUTROABS 11.8*  --   --    9.6* 10.1* 6.7 5.9  °HGB 11.8*   < > 10.6* 11.1* 10.7* 10.1* 10.4*  °HCT 37.3*   < > 33.0* 34.0* 32.3* 30.8* 32.3*  °MCV 94.0   < > 95.1 91.9 89.7 91.4 92.3  °PLT 285   < > 260 234 259 230 239  ° < > = values in this interval not displayed.  ° °Basic Metabolic Panel: °Recent Labs  °Lab 04/09/20 °0725 04/10/20 °0522 04/11/20 °0054 04/12/20 °0601 04/13/20 °0150  °NA 135 137 135 137 137  °K 3.3* 3.1* 4.1 3.3* 4.4  °CL 115* 110 107 104 105  °CO2 11* 15* 16* 23 22  °GLUCOSE 179* 153* 125* 129* 98  °BUN 50* 46* 42* 37* 38*  °CREATININE 2.52* 2.39* 2.36* 2.26* 2.41*  °CALCIUM 7.8* 7.8* 7.8* 7.7* 7.8*  °MG  --  1.5* 1.9  --   --   ° °GFR: °Estimated Creatinine Clearance: 24.6 mL/min (A) (by C-G formula based on SCr of 2.41 mg/dL (H)). °Liver Function Tests: °Recent Labs  °Lab 04/08/20 °2120 04/09/20 °0725  °AST 15 16  °ALT 17 16  °ALKPHOS 70 62  °BILITOT 0.5 0.5  °PROT 6.7 5.7*  °ALBUMIN 2.9* 2.4*  ° °No results for input(s): LIPASE, AMYLASE in the last 168 hours. °No results for input(s): AMMONIA in the last 168 hours. °Coagulation Profile: °Recent Labs  °Lab 04/08/20 °2153 04/09/20 °0725 04/12/20 °1651  °INR 1.5* 1.5* 1.3*  ° °Cardiac Enzymes: °Recent Labs  °Lab 04/08/20 °2120  °CKTOTAL 143  ° °BNP (last 3 results) °No results for input(s): PROBNP in the last 8760 hours. °HbA1C: °No results for input(s): HGBA1C in the last 72 hours. °CBG: °Recent Labs  °Lab 04/12/20 °1158 04/12/20 °1606 04/12/20 °2110 04/13/20 °0754 04/13/20 °1154  °GLUCAP 104* 100* 144* 83 93  ° °Lipid Profile: °No results for input(s): CHOL, HDL, LDLCALC, TRIG, CHOLHDL, LDLDIRECT in the last 72 hours. °Thyroid Function Tests: °No results for input(s): TSH, T4TOTAL, FREET4, T3FREE, THYROIDAB in the last 72 hours. °Anemia Panel: °No results for input(s): VITAMINB12, FOLATE, FERRITIN, TIBC, IRON, RETICCTPCT in the last 72 hours. °Sepsis Labs: °Recent Labs  °Lab 04/08/20 °2120 04/08/20 °2250 04/09/20 °0411 04/09/20 °0725  04/09/20 °0943  °PROCALCITON  --   --   --  9.21  --   °LATICACIDVEN 2.2* 3.0* 1.0  --  1.6  ° ° °Recent Results (from the past 240 hour(s))  °Respiratory Panel by RT PCR (Flu A&B, Covid) - Nasopharyngeal Swab     Status: None  ° Collection Time: 04/08/20  9:00 PM  ° Specimen: Nasopharyngeal Swab  °Result Value Ref Range Status  ° SARS Coronavirus 2 by RT PCR NEGATIVE NEGATIVE Final  °  Comment: (NOTE) °SARS-CoV-2 target nucleic acids are NOT DETECTED. ° °The SARS-CoV-2 RNA is generally detectable in upper respiratoy °specimens during the acute phase of infection. The lowest °concentration of SARS-CoV-2 viral copies this assay can detect is °131 copies/mL. A negative result does not preclude SARS-Cov-2 °infection and should not be used as the sole basis for treatment or °other patient management decisions. A negative result may occur with  °improper specimen collection/handling, submission of specimen other °than nasopharyngeal swab, presence of viral mutation(s) within the °areas targeted by this assay, and inadequate number of viral copies °(<131 copies/mL). A negative result must be combined with clinical °observations, patient history, and epidemiological information. The °expected result is Negative. ° °Fact Sheet for Patients:  °https://www.fda.gov/media/142436/download ° °Fact Sheet for Healthcare Providers:  °https://www.fda.gov/media/142435/download ° °This test is   no t yet approved or cleared by the [place] FDA and  °has been authorized for detection and/or diagnosis of SARS-CoV-2 by °FDA under an Emergency Use Authorization (EUA). This EUA will remain  °in effect (meaning this test can be used) for the duration of the °COVID-19 declaration under Section 564(b)(1) of the Act, 21 U.S.C. °section 360bbb-3(b)(1), unless the authorization is terminated or °revoked sooner. ° °  ° Influenza A by PCR NEGATIVE NEGATIVE Final  ° Influenza B by PCR NEGATIVE NEGATIVE Final  °  Comment: (NOTE) °The Xpert Xpress  SARS-CoV-2/FLU/RSV assay is intended as an aid in  °the diagnosis of influenza from Nasopharyngeal swab specimens and  °should not be used as a sole basis for treatment. Nasal washings and  °aspirates are unacceptable for Xpert Xpress SARS-CoV-2/FLU/RSV  °testing. ° °Fact Sheet for Patients: °https://www.fda.gov/media/142436/download ° °Fact Sheet for Healthcare Providers: °https://www.fda.gov/media/142435/download ° °This test is not yet approved or cleared by the [place] FDA and  °has been authorized for detection and/or diagnosis of SARS-CoV-2 by  °FDA under an Emergency Use Authorization (EUA). This EUA will remain  °in effect (meaning this test can be used) for the duration of the  °Covid-19 declaration under Section 564(b)(1) of the Act, 21  °U.S.C. section 360bbb-3(b)(1), unless the authorization is  °terminated or revoked. °Performed at [facility], [address] [place], [place] °[address] °  °Blood Culture (routine x 2)     Status: Abnormal (Preliminary result)  ° Collection Time: 04/08/20  9:25 PM  ° Specimen: BLOOD LEFT ARM  °Result Value Ref Range Status  ° Specimen Description BLOOD LEFT ARM  Final  ° Special Requests   Final  °  BOTTLES DRAWN AEROBIC AND ANAEROBIC Blood Culture adequate volume  ° Culture  Setup Time   Final  °  GRAM POSITIVE COCCI IN CHAINS °GRAM NEGATIVE RODS °AEROBIC BOTTLE ONLY °CRITICAL RESULT CALLED TO, READ BACK BY AND VERIFIED WITH: PHARMD F WILSON 04/09/20 AT 1724 SK °GRAM POSITIVE RODS GRAM NEGATIVE RODS °ANAEROBIC BOTTLE ONLY °CRITICAL RESULT CALLED TO, READ BACK BY AND VERIFIED WITH: F WILSON PHARMD 1700 04/10/20 A BROWNING °  ° Culture (A)  Final  °  ENTEROCOCCUS FAECALIS °PSEUDOMONAS AERUGINOSA °PREVOTELLA MELANINOGENICA °BETA LACTAMASE POSITIVE °CULTURE REINCUBATED FOR BETTER GROWTH °Performed at [facility], [address] [place], [place] [address] °  ° Report Status PENDING  Incomplete  ° Organism ID, Bacteria PSEUDOMONAS AERUGINOSA  Final  °  Organism ID, Bacteria ENTEROCOCCUS FAECALIS  Final  °    Susceptibility  ° Enterococcus faecalis - MIC*  °  AMPICILLIN <=2 SENSITIVE Sensitive   °  VANCOMYCIN 2 SENSITIVE Sensitive   °  GENTAMICIN SYNERGY SENSITIVE Sensitive   °  * ENTEROCOCCUS FAECALIS  ° Pseudomonas aeruginosa - MIC*  °  CEFTAZIDIME 4 SENSITIVE Sensitive   °  CIPROFLOXACIN >=4 RESISTANT Resistant   °  GENTAMICIN <=1 SENSITIVE Sensitive   °  IMIPENEM 2 SENSITIVE Sensitive   °  PIP/TAZO 8 SENSITIVE Sensitive   °  CEFEPIME 2 SENSITIVE Sensitive   °  * PSEUDOMONAS AERUGINOSA  °Urine culture     Status: Abnormal  ° Collection Time: 04/08/20  9:25 PM  ° Specimen: Urine, Clean Catch  °Result Value Ref Range Status  ° Specimen Description URINE, CLEAN CATCH  Final  ° Special Requests   Final  °  NONE °Performed at [facility], [address] [place], [place] [address] °  ° Culture   MULTIPLE SPECIES PRESENT, SUGGEST RECOLLECTION (A)  Final  ° Report Status 04/10/2020 FINAL  Final  °Blood Culture ID Panel (Reflexed)     Status: Abnormal  ° Collection Time: 04/08/20  9:25 PM  °Result Value Ref Range Status  ° Enterococcus faecalis DETECTED (A) NOT DETECTED Final  °  Comment: CRITICAL RESULT CALLED TO, READ BACK BY AND VERIFIED WITH: °PHARMD F WILSON 04/09/20 AT 1724 SK °  ° Enterococcus Faecium NOT DETECTED NOT DETECTED Final  ° Listeria monocytogenes NOT DETECTED NOT DETECTED Final  ° Staphylococcus species NOT DETECTED NOT DETECTED Final  ° Staphylococcus aureus (BCID) NOT DETECTED NOT DETECTED Final  ° Staphylococcus epidermidis NOT DETECTED NOT DETECTED Final  ° Staphylococcus lugdunensis NOT DETECTED NOT DETECTED Final  ° Streptococcus species NOT DETECTED NOT DETECTED Final  ° Streptococcus agalactiae NOT DETECTED NOT DETECTED Final  ° Streptococcus pneumoniae NOT DETECTED NOT DETECTED Final  ° Streptococcus pyogenes NOT DETECTED NOT DETECTED Final  ° A.calcoaceticus-baumannii NOT DETECTED NOT DETECTED Final  ° Bacteroides fragilis NOT DETECTED  NOT DETECTED Final  ° Enterobacterales NOT DETECTED NOT DETECTED Final  ° Enterobacter cloacae complex NOT DETECTED NOT DETECTED Final  ° Escherichia coli NOT DETECTED NOT DETECTED Final  ° Klebsiella aerogenes NOT DETECTED NOT DETECTED Final  ° Klebsiella oxytoca NOT DETECTED NOT DETECTED Final  ° Klebsiella pneumoniae NOT DETECTED NOT DETECTED Final  ° Proteus species NOT DETECTED NOT DETECTED Final  ° Salmonella species NOT DETECTED NOT DETECTED Final  ° Serratia marcescens NOT DETECTED NOT DETECTED Final  ° Haemophilus influenzae NOT DETECTED NOT DETECTED Final  ° Neisseria meningitidis NOT DETECTED NOT DETECTED Final  ° Pseudomonas aeruginosa DETECTED (A) NOT DETECTED Final  °  Comment: CRITICAL RESULT CALLED TO, READ BACK BY AND VERIFIED WITH: °PHARMD F WILSON 04/09/20 AT 1724 SK °  ° Stenotrophomonas maltophilia NOT DETECTED NOT DETECTED Final  ° Candida albicans NOT DETECTED NOT DETECTED Final  ° Candida auris NOT DETECTED NOT DETECTED Final  ° Candida glabrata NOT DETECTED NOT DETECTED Final  ° Candida krusei NOT DETECTED NOT DETECTED Final  ° Candida parapsilosis NOT DETECTED NOT DETECTED Final  ° Candida tropicalis NOT DETECTED NOT DETECTED Final  ° Cryptococcus neoformans/gattii NOT DETECTED NOT DETECTED Final  ° CTX-M ESBL NOT DETECTED NOT DETECTED Final  ° Carbapenem resistance IMP NOT DETECTED NOT DETECTED Final  ° Carbapenem resistance KPC NOT DETECTED NOT DETECTED Final  ° Carbapenem resistance NDM NOT DETECTED NOT DETECTED Final  ° Vancomycin resistance NOT DETECTED NOT DETECTED Final  ° Carbapenem resistance VIM NOT DETECTED NOT DETECTED Final  °  Comment: Performed at [facility], [address] [place], [place] [address]  °Blood Culture (routine x 2)     Status: None  ° Collection Time: 04/08/20  9:34 PM  ° Specimen: BLOOD RIGHT HAND  °Result Value Ref Range Status  ° Specimen Description BLOOD RIGHT HAND  Final  ° Special Requests   Final  °  BOTTLES DRAWN AEROBIC AND ANAEROBIC Blood  Culture results may not be optimal due to an inadequate volume of blood received in culture bottles  ° Culture   Final  °  NO GROWTH 5 DAYS °Performed at [facility], [address] [place], [place] [address] °  ° Report Status 04/13/2020 FINAL  Final  °Culture, blood (routine x 2)     Status: None (Preliminary result)  ° Collection Time: 04/11/20 11:50 AM  ° Specimen: BLOOD RIGHT HAND  °Result Value Ref   Range Status  ° Specimen Description BLOOD RIGHT HAND  Final  ° Special Requests   Final  °  BOTTLES DRAWN AEROBIC AND ANAEROBIC Blood Culture adequate volume  ° Culture   Final  °  NO GROWTH 2 DAYS °Performed at [facility], [address] [place], [place] [address] °  ° Report Status PENDING  Incomplete  °Culture, blood (routine x 2)     Status: None (Preliminary result)  ° Collection Time: 04/11/20 12:00 PM  ° Specimen: BLOOD RIGHT ARM  °Result Value Ref Range Status  ° Specimen Description BLOOD RIGHT ARM  Final  ° Special Requests   Final  °  BOTTLES DRAWN AEROBIC AND ANAEROBIC Blood Culture adequate volume  ° Culture   Final  °  NO GROWTH 2 DAYS °Performed at [facility], [address] [place], [place] [address] °  ° Report Status PENDING  Incomplete  °Aerobic/Anaerobic Culture (surgical/deep wound)     Status: None (Preliminary result)  ° Collection Time: 04/13/20 10:00 AM  ° Specimen: Fluid; Urine  °Result Value Ref Range Status  ° Specimen Description FLUID  Final  ° Special Requests LEFT KIDNEY  Final  ° Gram Stain   Final  °  ABUNDANT WBC PRESENT,BOTH PMN AND MONONUCLEAR °ABUNDANT GRAM POSITIVE COCCI °Performed at [facility], [address] [place], [place] [address] °  ° Culture PENDING  Incomplete  ° Report Status PENDING  Incomplete  °  ° ° °Radiology Studies: °MR ABDOMEN WO CONTRAST ° °Result Date: 04/12/2020 °CLINICAL DATA:  Renal mass, renal insufficiency EXAM: MRI ABDOMEN WITHOUT CONTRAST TECHNIQUE: Multiplanar multisequence MR imaging was performed without the  administration of intravenous contrast. COMPARISON:  CT of the abdomen and pelvis dated December 19, 2017 FINDINGS: Lower chest: No sign of consolidation or pleural effusion. Limited assessment of lung bases on MRI. Hepatobiliary: Limited assessment on noncontrast imaging of the liver. No visible lesion with suspicious features. No pericholecystic stranding or sign of biliary duct dilation. Pancreas: 13 mm lesion in the head of the pancreas with cystic features. Mild main duct distension grossly similar to previous imaging with ectatic side branches. The cystic lesion in the head of the pancreas is unchanged compared to Dec 04, 2018. Loss of normal intrinsic T1 hyperintensity within the pancreatic parenchyma is similar to previous imaging Spleen: Spleen normal in size and contour without focal lesion. Limited assessment without contrast. Adrenals/Urinary Tract:  Adrenal glands are grossly normal. Moderate LEFT-sided hydroureteronephrosis. In the LEFT renal pelvis is an area of low T2 signal measuring approximately 10 x 6 mm. Extensive inflammatory changes surround the LEFT renal pelvis. There is distension of LEFT renal collecting systems as described, some of this distension showing intermediate rather than high T2 signal without high T1 signal and with signs of profound restricted diffusion. Mixed signal on diffusion is noted with a more central area in the renal sinus measuring approximately 3.1 x 0.9 cm displaying less diffusion than material that fills the peripheral collecting systems. Striated parenchymal signal on T2 weighted imaging and on diffusion RIGHT kidney with potential calculus in the lower pole best seen a susceptibility on image 25 of series 12. This is nonspecific. Mild fullness of the RIGHT ureter without overt hydronephrosis in the setting of ileal conduit following cysto prostatectomy. Stomach/Bowel: Limited assessment of gastrointestinal structures. RIGHT lower quadrant ileal conduit ostomy. Small  bowel, likely a portion of the conduit contained within a small ventral hernia is imaged only on coronal   images, grossly unchanged accounting for incomplete visualization as compared to December 19, 2017. Study not performed for bowel evaluation and therefore with limited assessment of bowel. Vascular/Lymphatic: Vascular structures in the abdomen not well assessed with signs of atheromatous plaque. Mildly enlarged lymph nodes along the LEFT periaortic chain, largest 13 mm on image 66 of series 10. Other: Generalized mild body wall edema. No visible ascites. No perinephric collection. Musculoskeletal: No focal, suspicious bone lesion visualized. IMPRESSION: 1. Interval migration of a calculus (approximately 10 x 6 mm) from the peripheral collecting systems of the LEFT kidney into the renal pelvis with surrounding inflammation and associated moderate hydronephrosis. 2. Material filling calices and collecting system elements may represent tumor or infectious debris, urologic consultation with retrograde evaluation as warranted is suggested. Noncontrast CT could be performed for confirmation of renal pelvic calculus if desired. 3. Striated appearance of parenchyma on the LEFT on T2 weighted imaging suggests concomitant pyelonephritis, combined with central obstructing calculus as described. 4. Mild fullness of the RIGHT ureter without overt hydronephrosis in the setting of ileal conduit following cysto prostatectomy. 5. Mildly enlarged LEFT periaortic lymph nodes, nonspecific. 6. 13 mm cystic lesion in the head of the pancreas with cystic features. This is unchanged compared to previous imaging and may represent a side branch IPMN. Attention on follow-up. MRCP at 2 year interval could be performed as warranted for further assessment These results will be called to the ordering clinician or representative by the Radiologist Assistant, and communication documented in the PACS or [employer]. Electronically Signed   By:  Zetta Bills M.D.   On: 04/12/2020 08:18   IR NEPHROSTOMY PLACEMENT LEFT  Result Date: 04/13/2020 INDICATION: 84 year old male with a history of obstructing left UPJ stone, referred for percutaneous drainage EXAM: IR NEPHROSTOMY PLACEMENT LEFT COMPARISON:  None. MEDICATIONS: Ciprofloxacin 400 mg IV; The antibiotic was administered in an appropriate time frame prior to skin puncture. ANESTHESIA/SEDATION: Fentanyl 75 mcg IV; Versed 1.0 mg IV Moderate Sedation Time:  13 minutes The patient was continuously monitored during the procedure by the interventional radiology nurse under my direct supervision. CONTRAST:  76m OMNIPAQUE IOHEXOL 300 MG/ML SOLN - administered into the collecting system(s) FLUOROSCOPY TIME:  Fluoroscopy Time: 1 minutes 48 seconds (6 mGy). COMPLICATIONS: None PROCEDURE: Informed written consent was obtained from the patient after a thorough discussion of the procedural risks, benefits and alternatives. All questions were addressed. Maximal Sterile Barrier Technique was utilized including caps, mask, sterile gowns, sterile gloves, sterile drape, hand hygiene and skin antiseptic. A timeout was performed prior to the initiation of the procedure. Patient positioned prone position on the fluoroscopy table. Ultrasound survey of the left flank was performed with images stored and sent to PACs. The patient was then prepped and draped in the usual sterile fashion. 1% lidocaine was used to anesthetize the skin and subcutaneous tissues for local anesthesia. A Chiba needle was then used to access a posterior inferior calyx with ultrasound guidance. With spontaneous urine returned through the needle, passage of an 018 micro wire into the collecting system was performed under fluoroscopy. A small incision was made with an 11 blade scalpel, and the needle was removed from the wire. An Accustick system was then advanced over the wire into the collecting system under fluoroscopy. The metal stiffener and inner  dilator were removed, and then a sample of fluid was aspirated through the 4 French outer sheath. Bentson wire was passed into the collecting system and the sheath removed. Ten French dilation of the  soft tissues was performed. Using modified Seldinger technique, a 10 French pigtail catheter drain was placed over the Bentson wire. Wire and inner stiffener removed, and the pigtail was formed in the collecting system. Small amount of contrast confirmed position of the catheter. Patient tolerated the procedure well and remained hemodynamically stable throughout. No complications were encountered and no significant blood loss encountered FINDINGS: Frankly purulent urine.  Sample was sent IMPRESSION: Status post image guided placement of left percutaneous nephrostomy. Sample was sent for culture. Signed, Jaime S. Wagner, DO, RPVI [facility] Electronically Signed   By: Jaime  Wagner D.O.   On: 04/13/2020 10:49  ° ° °Scheduled Meds: °• feeding supplement (GLUCERNA SHAKE)  237 mL Oral TID BM  °• ferrous sulfate  325 mg Oral Q breakfast  °• heparin  5,000 Units Subcutaneous Q8H  °• insulin aspart  0-5 Units Subcutaneous QHS  °• insulin aspart  0-9 Units Subcutaneous TID WC  °• insulin glargine  5 Units Subcutaneous QHS  °• lidocaine (PF)      °• loratadine  10 mg Oral Daily  °• midazolam      °• multivitamin with minerals  1 tablet Oral Daily  °• omega-3 acid ethyl esters  1 g Oral BID  °• pravastatin  20 mg Oral Daily  ° °Continuous Infusions: °• sodium chloride 75 mL/hr at 04/12/20 2144  °• piperacillin-tazobactam (ZOSYN)  IV 3.375 g (04/13/20 1124)  ° ° ° LOS: 4 days  ° °Time spent: 30 minutes ° ° ° , MD °[facility] ° °04/13/2020, 2:51 PM  ° °To contact the attending provider between 7A-7P or the covering provider during after hours 7P-7A, please log into the web site [email or web address]. ° °

## 2020-04-13 NOTE — Progress Notes (Signed)
PT Cancellation Note  Patient Details Name: Melvin Whiteford MRN: [id number] DOB: April 29, 1936   Cancelled Treatment:    Reason Eval/Treat Not Completed: Patient at procedure or test/unavailable (Pt just back from procedure.  Will check back Monday.)   Denice Paradise 04/13/2020, 12:09 PM Locke Barrell W,PT Acute Rehabilitation Services Pager:  [phone]  Office:  [phone]

## 2020-04-13 NOTE — Progress Notes (Signed)
Shane [name] is currently undergoing his IR procedure.   His repeat cultures have remained negative to date.  No changes from an ID perspective. He will continue with piperacillin/tazobactam while inpatient and at discharge can stop antibiotics.    Dr. Baxter Flattery is available over the weekend if needed, otherwise I will follow up on Monday.   Thayer Headings, MD

## 2020-04-13 NOTE — Progress Notes (Signed)
Subjective: Afebrile overnight. Pain controlled. Awaiting PCN.  Objective: Vital signs in last 24 hours: Temp:  [98.4 F (36.9 C)-98.9 F (37.2 C)] 98.7 F (37.1 C) (09/30 2304) Pulse Rate:  [55-64] 55 (09/30 2304) Resp:  [17-27] 20 (10/01 0010) BP: (114-117)/(57-71) 117/71 (09/30 2304) SpO2:  [96 %-98 %] 98 % (09/30 2304)  Intake/Output from previous day: 09/30 0701 - 10/01 0700 In: 162.5 [I.V.:112.5; IV Piggyback:50] Out: -  Intake/Output this shift: No intake/output data recorded.  Physical Exam:  General: Alert and oriented CV: RRR Lungs: Clear Abdomen: Soft, ND, NT Ext: NT, No erythema  Lab Results: Recent Labs    04/11/20 0054 04/12/20 0601 04/13/20 0150  HGB 10.7* 10.1* 10.4*  HCT 32.3* 30.8* 32.3*   BMET Recent Labs    04/12/20 0601 04/13/20 0150  NA 137 137  K 3.3* 4.4  CL 104 105  CO2 23 22  GLUCOSE 129* 98  BUN 37* 38*  CREATININE 2.26* 2.41*  CALCIUM 7.7* 7.8*     Studies/Results: MR ABDOMEN WO CONTRAST  Result Date: 04/12/2020 CLINICAL DATA:  Renal mass, renal insufficiency EXAM: MRI ABDOMEN WITHOUT CONTRAST TECHNIQUE: Multiplanar multisequence MR imaging was performed without the administration of intravenous contrast. COMPARISON:  CT of the abdomen and pelvis dated December 19, 2017 FINDINGS: Lower chest: No sign of consolidation or pleural effusion. Limited assessment of lung bases on MRI. Hepatobiliary: Limited assessment on noncontrast imaging of the liver. No visible lesion with suspicious features. No pericholecystic stranding or sign of biliary duct dilation. Pancreas: 13 mm lesion in the head of the pancreas with cystic features. Mild main duct distension grossly similar to previous imaging with ectatic side branches. The cystic lesion in the head of the pancreas is unchanged compared to Dec 04, 2018. Loss of normal intrinsic T1 hyperintensity within the pancreatic parenchyma is similar to previous imaging Spleen: Spleen normal in size and  contour without focal lesion. Limited assessment without contrast. Adrenals/Urinary Tract:  Adrenal glands are grossly normal. Moderate LEFT-sided hydroureteronephrosis. In the LEFT renal pelvis is an area of low T2 signal measuring approximately 10 x 6 mm. Extensive inflammatory changes surround the LEFT renal pelvis. There is distension of LEFT renal collecting systems as described, some of this distension showing intermediate rather than high T2 signal without high T1 signal and with signs of profound restricted diffusion. Mixed signal on diffusion is noted with a more central area in the renal sinus measuring approximately 3.1 x 0.9 cm displaying less diffusion than material that fills the peripheral collecting systems. Striated parenchymal signal on T2 weighted imaging and on diffusion RIGHT kidney with potential calculus in the lower pole best seen a susceptibility on image 25 of series 12. This is nonspecific. Mild fullness of the RIGHT ureter without overt hydronephrosis in the setting of ileal conduit following cysto prostatectomy. Stomach/Bowel: Limited assessment of gastrointestinal structures. RIGHT lower quadrant ileal conduit ostomy. Small bowel, likely a portion of the conduit contained within a small ventral hernia is imaged only on coronal images, grossly unchanged accounting for incomplete visualization as compared to December 19, 2017. Study not performed for bowel evaluation and therefore with limited assessment of bowel. Vascular/Lymphatic: Vascular structures in the abdomen not well assessed with signs of atheromatous plaque. Mildly enlarged lymph nodes along the LEFT periaortic chain, largest 13 mm on image 66 of series 10. Other: Generalized mild body wall edema. No visible ascites. No perinephric collection. Musculoskeletal: No focal, suspicious bone lesion visualized. IMPRESSION: 1. Interval migration of a calculus (approximately  10 x 6 mm) from the peripheral collecting systems of the LEFT  kidney into the renal pelvis with surrounding inflammation and associated moderate hydronephrosis. 2. Material filling calices and collecting system elements may represent tumor or infectious debris, urologic consultation with retrograde evaluation as warranted is suggested. Noncontrast CT could be performed for confirmation of renal pelvic calculus if desired. 3. Striated appearance of parenchyma on the LEFT on T2 weighted imaging suggests concomitant pyelonephritis, combined with central obstructing calculus as described. 4. Mild fullness of the RIGHT ureter without overt hydronephrosis in the setting of ileal conduit following cysto prostatectomy. 5. Mildly enlarged LEFT periaortic lymph nodes, nonspecific. 6. 13 mm cystic lesion in the head of the pancreas with cystic features. This is unchanged compared to previous imaging and may represent a side branch IPMN. Attention on follow-up. MRCP at 2 year interval could be performed as warranted for further assessment These results will be called to the ordering clinician or representative by the Radiologist Assistant, and communication documented in the PACS or [employer]. Electronically Signed   By: Zetta Bills M.D.   On: 04/12/2020 08:18   US RENAL  Result Date: 04/11/2020 CLINICAL DATA:  Acute kidney injury. EXAM: RENAL / URINARY TRACT ULTRASOUND COMPLETE COMPARISON:  Dec 04, 2018.  December 19, 2017 FINDINGS: Right Kidney: Renal measurements: 11.5 x 5.5 x 5.0 cm = volume: 166 mL. Mild hydronephrosis is noted. Echogenicity within normal limits. No mass visualized. Left Kidney: Renal measurements: 11.8 x 6.7 x 6.0 cm = volume: 248 mL. Echogenicity within normal limits. Mild hydronephrosis is noted. Lobulated echogenic abnormality is noted in the left renal pelvis. It is nonshadowing and measures approximately 2.5 x 3.0 cm. Bladder: Surgically removed due to cancer. Other: None. IMPRESSION: 1. Mild bilateral hydronephrosis is noted. 2. 3 cm lobular echogenic  abnormality is noted in the left renal pelvis which is concerning for possible mass or malignancy. CT scan or MRI scan with and without contrast administration is recommended for further evaluation. Electronically Signed   By: Marijo Conception M.D.   On: 04/11/2020 13:21    Assessment/Plan: 1. Left renal pelvis stone measuring 10x13mm causing obstruction: MR abd 9/29 with 10 x 6 mm left renal pelvis stone with moderate hydronephrosis. 2. Left renal pelvis filling defect possibly tumor versus infectious debris.  Seen on MR abd 9/29. 3. Right lower pole calculus.  Possibly seen on MR abd 9/29.  No evidence for hydronephrosis on the right side. 4. Sepsis due to urinary source: Presented to ED with recurrent falls, fever, weakness, AMS. T-max 100.9 in ED with systolic blood pressures in the 80s.  UA with many bacteria, white blood cells, large leukocyte esterase.  Urine culture 04/08/2020 with multiple species present.  Blood culture growing Enterococcus faecalis and Pseudomonas.  In setting of obstruction from left-sided renal pelvis stone as above. 5. AKI on CKD 3a: Cr 2.26 on 04/12/2020 at time of consultation 6. 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 Tresa Moore to treat L pelvic stone/evaluate for possible UTUC   LOS: 4 days   Shane [name] 04/13/2020, 7:19 AM [facility]  Pager: [phone]

## 2020-04-13 NOTE — Progress Notes (Signed)
Patient temp elevated post surgery, MEWS 3 Yellow, Pahwani, MD alerted. Tylenol, and cool bath provided will follow up vitals.

## 2020-04-13 NOTE — Procedures (Signed)
Interventional Radiology Procedure Note  Procedure: Image guided drain placement, left PCN.  98F pigtail drain.  Complications: None  EBL: None Sample: Culture sent  Recommendations: - Routine drain care record output - follow up Cx - routine wound care  Signed,  Dulcy Fanny. Earleen Newport, DO

## 2020-04-14 LAB — CBC WITH DIFFERENTIAL/PLATELET
Abs Immature Granulocytes: 0.19 10*3/uL — ABNORMAL HIGH (ref 0.00–0.07)
Basophils Absolute: 0.1 10*3/uL (ref 0.0–0.1)
Basophils Relative: 0 %
Eosinophils Absolute: 0.1 10*3/uL (ref 0.0–0.5)
Eosinophils Relative: 0 %
HCT: 29.8 % — ABNORMAL LOW (ref 39.0–52.0)
Hemoglobin: 9.2 g/dL — ABNORMAL LOW (ref 13.0–17.0)
Immature Granulocytes: 1 %
Lymphocytes Relative: 8 %
Lymphs Abs: 1.5 10*3/uL (ref 0.7–4.0)
MCH: 29.3 pg (ref 26.0–34.0)
MCHC: 30.9 g/dL (ref 30.0–36.0)
MCV: 94.9 fL (ref 80.0–100.0)
Monocytes Absolute: 0.8 10*3/uL (ref 0.1–1.0)
Monocytes Relative: 4 %
Neutro Abs: 16.7 10*3/uL — ABNORMAL HIGH (ref 1.7–7.7)
Neutrophils Relative %: 87 %
Platelets: 208 10*3/uL (ref 150–400)
RBC: 3.14 MIL/uL — ABNORMAL LOW (ref 4.22–5.81)
RDW: 15.1 % (ref 11.5–15.5)
WBC: 19.3 10*3/uL — ABNORMAL HIGH (ref 4.0–10.5)
nRBC: 0 % (ref 0.0–0.2)

## 2020-04-14 LAB — BASIC METABOLIC PANEL
Anion gap: 12 (ref 5–15)
BUN: 36 mg/dL — ABNORMAL HIGH (ref 8–23)
CO2: 21 mmol/L — ABNORMAL LOW (ref 22–32)
Calcium: 8 mg/dL — ABNORMAL LOW (ref 8.9–10.3)
Chloride: 105 mmol/L (ref 98–111)
Creatinine, Ser: 2.58 mg/dL — ABNORMAL HIGH (ref 0.61–1.24)
GFR calc Af Amer: 26 mL/min — ABNORMAL LOW (ref >60)
GFR calc non Af Amer: 22 mL/min — ABNORMAL LOW (ref >60)
Glucose, Bld: 147 mg/dL — ABNORMAL HIGH (ref 70–99)
Potassium: 3.9 mmol/L (ref 3.5–5.1)
Sodium: 138 mmol/L (ref 135–145)

## 2020-04-14 LAB — GLUCOSE, CAPILLARY
Glucose-Capillary: 111 mg/dL — ABNORMAL HIGH (ref 70–99)
Glucose-Capillary: 132 mg/dL — ABNORMAL HIGH (ref 70–99)
Glucose-Capillary: 150 mg/dL — ABNORMAL HIGH (ref 70–99)
Glucose-Capillary: 132 mg/dL — ABNORMAL HIGH (ref 70–99)

## 2020-04-14 LAB — MAGNESIUM: Magnesium: 2 mg/dL (ref 1.7–2.4)

## 2020-04-14 NOTE — Progress Notes (Signed)
PROGRESS NOTE    Shane [name]  OIN:[id number] DOB: 1935/08/16 DOA: 04/08/2020 PCP: Tammi Sou, MD   Brief Narrative:  Shane [name] is a 84 y.o. male with medical history significant of bladder cancer status post Ileal conduit, calcific pancreatitis, chronic kidney disease stage IIIa, coronary artery disease, aortic stenosis hypertension, hyperlipidemia, recurring UTIs, recurrent GI bleeds, history of CVA, diabetes, duodenal ulcer who presented to the ER with recurrent falls, fever, weakness and altered mental status.Patient apparently was trying to go to the bathroom yesterday when he had a fall. He had COVID-19 in January which he recovered from but has had 3 other episodes of pneumonia since then he has continued to be weak and debilitated.  His weakness gotten worse in the last 2 to 3 days.  He came to the ER where he was found to have Temperature 100.9 blood pressure 84/41 pulse 97 respirate 25 oxygen sat 96% room air.  Venous pH is 7.196.  Sodium 132 potassium 3.6 chloride 111 CO2 obtain glucose 200 BUN 54 creatinine 2.58 and calcium 7.9.  CK is 143 lactic acid 3.0 white count 12.8 hemoglobin 11.8.  Platelets 285.  PT 17.2 INR 1.5.  Urinalysis showed cloudy urine with many bacteria WBC more than 50 RBC 6-10 large leukocytes.  Chest x-ray showed subsegmental opacities at the left lung base probably atelectasis or scarring possible pneumonia.  Head CT without contrast showed no acute findings and met criteria for sepsis secondary to UTI and possible pneumonia.  He additionally has features of endorgan damage/AKI on CKD stage IIIa making him have severe sepsis.  He was admitted under hospital service.  Started on broad-spectrum IV antibiotics as well as IV fluids.  Subsequently his blood culture grew Enterococcus faecalis as well as Pseudomonas.  ID was consulted.  His antibiotics were switched to ampicillin and cefepime and then to Zosyn.  His creatinine improved but very slowly.   Further history was provided by the daughter that he was also complaining of left flank pain prior to his fall.  This prompted ultrasound kidney which showed suspicion of possible left renal mass.  Radiology recommended CT or MRI with and without contrast however due to elevated creatinine/AKI, we could not perform contrasted study.  I had a long discussion with radiology who recommended MRI without contrast which was done on 04/12/2020 which shows large left renal pelvis stone measuring 10 x 6 mm.  Causing hydronephrosis and pyelonephritis.  Urology was consulted.  They intend consulted IR and patient underwent PCN on the left on 04/13/2020.  Assessment & Plan:   Principal Problem:   Severe sepsis ([place]) Active Problems:   Hypertension   Diabetes mellitus without complication ([place])   UTI (urinary tract infection)   Malignant neoplasm of urinary bladder (HCC)   S/P ileal conduit (HCC)   AKI (acute kidney injury) ([place])   Lobar pneumonia (HCC)   #1 severe sepsis secondary to UTI/left pyelonephritis/community-acquired pneumonia/gram-negative bacteremia: Patient met severe sepsis criteria based on fever, leukocytosis and tachypnea and endorgan damage of acute renal failure and lactic acid of 3.  Blood culture growing Enterococcus faecalis and Pseudomonas aeruginosa.  ID was also consulted. antibiotics were switched to ampicillin and cefepime and then to Zosyn on 04/11/2020.  Sepsis physiology is improving.  S/p left PCN 04/13/2020 by IR.  Jump in leukocytosis today.  Could very well be due to stress reaction.  No fever.  Continue current antibiotics.  #2 acute kidney injury on chronic kidney disease stage IIIa/dehydration/left renal  stone/left pyelonephritis/left hydronephrosis: Secondary to sepsis/ATN. Creatinine only slightly improved again today but not as much as I had expected.  After receiving more history from the daughter yesterday where she told me that patient was complaining of left flank pain  before the fall, ultrasound kidney was done which showed suspicion of left renal mass. Radiology recommended CT or MRI with and without contrast however due to elevated creatinine/AKI, we could not perform contrasted study.  I had a long discussion with radiology who recommended MRI without contrast which was done on 04/12/2020 which shows large left renal pelvis stone measuring 10 x 6 mm.  Causing hydronephrosis and pyelonephritis.  Now s/p left PCN by IR.  Morning BMP still pending.  #3  Non-anion gap metabolic acidosis: Likely due to AKI.  Now resolved.   #4.  Hypokalemia: Resolved  #6 history of urinary bladder cancer: Patient has had surgery on treatment.  Defer to his oncologist.  #7 diabetes type II: Continue with sliding scale insulin and supportive care.  #8 hypertension: Controlled.  Patient takes only lisinopril at home which is on hold due to AKI.  #9 hypomagnesemia: Resolved  #10: Deconditioning: PT OT consulted.  Home health recommended.  DVT prophylaxis: heparin injection 5,000 Units Start: 04/09/20 0715   Code Status: Full Code  Family Communication: Daughter present at bedside.  Plan of care discussed with patient in length with patient and he verbalized understanding and agreed with it.   Status is: Inpatient  Remains inpatient appropriate because:Inpatient level of care appropriate due to severity of illness   Dispo: The patient is from: Home              Anticipated d/c is to: Home with home health              Anticipated d/c date is: 1 to 2 days              Patient currently is not medically stable to d/c.        Estimated body mass index is 29.35 kg/m as calculated from the following:   Height as of this encounter: _0  (1.702 m).   Weight as of this encounter: 85 kg.      Nutritional status:  Nutrition Problem: Moderate Malnutrition Etiology: chronic illness (recurrent PNA/UTIs)   Signs/Symptoms: mild fat depletion, moderate muscle  depletion   Interventions: Refer to RD note for recommendations    Consultants:   ID  Urology  IR  Procedures:  Left PCN  Antimicrobials:  Anti-infectives (From admission, onward)   Start     Dose/Rate Route Frequency Ordered Stop   04/13/20 0922  ciprofloxacin (CIPRO) 400 MG/200ML IVPB  Status:  Discontinued       Note to Pharmacy: Domenick Bookbinder   : cabinet override      04/13/20 0922 04/13/20 1147   04/11/20 1800  piperacillin-tazobactam (ZOSYN) IVPB 3.375 g        3.375 g 12.5 mL/hr over 240 Minutes Intravenous Every 8 hours 04/11/20 1547     04/10/20 2000  ceFEPIme (MAXIPIME) 2 g in sodium chloride 0.9 % 100 mL IVPB  Status:  Discontinued        2 g 200 mL/hr over 30 Minutes Intravenous Every 24 hours 04/09/20 1806 04/11/20 1524   04/09/20 2130  ceFEPIme (MAXIPIME) 2 g in sodium chloride 0.9 % 100 mL IVPB  Status:  Discontinued        2 g 200 mL/hr over 30 Minutes Intravenous Every 24  hours 04/08/20 2207 04/09/20 0957   04/09/20 2000  ampicillin (OMNIPEN) 2 g in sodium chloride 0.9 % 100 mL IVPB  Status:  Discontinued        2 g 300 mL/hr over 20 Minutes Intravenous Every 8 hours 04/09/20 1806 04/11/20 1524   04/09/20 1900  ceFEPIme (MAXIPIME) 2 g in sodium chloride 0.9 % 100 mL IVPB        2 g 200 mL/hr over 30 Minutes Intravenous  Once 04/09/20 1806 04/09/20 2030   04/09/20 0715  cefTRIAXone (ROCEPHIN) 2 g in sodium chloride 0.9 % 100 mL IVPB  Status:  Discontinued        2 g 200 mL/hr over 30 Minutes Intravenous Every 24 hours 04/09/20 0711 04/09/20 1804   04/09/20 0715  azithromycin (ZITHROMAX) 500 mg in sodium chloride 0.9 % 250 mL IVPB  Status:  Discontinued        500 mg 250 mL/hr over 60 Minutes Intravenous Every 24 hours 04/09/20 0711 04/09/20 1804   04/08/20 2207  vancomycin variable dose per unstable renal function (pharmacist dosing)  Status:  Discontinued         Does not apply See admin instructions 04/08/20 2207 04/09/20 0956   04/08/20 2130   ceFEPIme (MAXIPIME) 2 g in sodium chloride 0.9 % 100 mL IVPB        2 g 200 mL/hr over 30 Minutes Intravenous  Once 04/08/20 2119 04/08/20 2213   04/08/20 2130  metroNIDAZOLE (FLAGYL) IVPB 500 mg        500 mg 100 mL/hr over 60 Minutes Intravenous  Once 04/08/20 2119 04/08/20 2231   04/08/20 2130  vancomycin (VANCOCIN) IVPB 1000 mg/200 mL premix  Status:  Discontinued        1,000 mg 200 mL/hr over 60 Minutes Intravenous  Once 04/08/20 2119 04/08/20 2126   04/08/20 2130  vancomycin (VANCOREADY) IVPB 1750 mg/350 mL        1,750 mg 175 mL/hr over 120 Minutes Intravenous  Once 04/08/20 2126 04/08/20 2351         Subjective: Seen and examined.  Daughter at the bedside.  Feels well without having any complaints.  Objective: Vitals:   04/13/20 1453 04/13/20 1626 04/13/20 2144 04/14/20 0830  BP: (!) 103/54 (!) 91/50 (!) 101/57 118/63  Pulse:  80 67 (!) 54  Resp: (!) _0 Temp: (!) 100.7 F (38.2 C) 98.1 F (36.7 C) 98.2 F (36.8 C) (!) 97.5 F (36.4 C)  TempSrc: Oral  Oral   SpO2: 93% 93% 99% 97%  Weight:      Height:        Intake/Output Summary (Last 24 hours) at 04/14/2020 1115 Last data filed at 04/14/2020 0647 Gross per 24 hour  Intake 60 ml  Output 1675 ml  Net -1615 ml   Filed Weights   04/08/20 2104  Weight: 85 kg    Examination: General exam: Appears calm and comfortable  Respiratory system: Clear to auscultation. Respiratory effort normal. Cardiovascular system: S1 & S2 heard, RRR. No JVD, murmurs, rubs, gallops or clicks. No pedal edema. Gastrointestinal system: Abdomen is nondistended, soft and nontender. No organomegaly or masses felt. Normal bowel sounds heard.  Left PCN with clear yellow urine in the bag. Central nervous system: Alert and oriented. No focal neurological deficits. Extremities: Symmetric 5 x 5 power. Skin: No rashes, lesions or ulcers.  Psychiatry: Judgement and insight appear normal. Mood & affect appropriate.   Data Reviewed: I  have personally  reviewed following labs and imaging studies  CBC: Recent Labs  Lab 04/10/20 0522 04/11/20 0054 04/12/20 0601 04/13/20 0150 04/14/20 0125  WBC 11.1* 11.4* 8.8 8.4 19.3*  NEUTROABS 9.6* 10.1* 6.7 5.9 16.7*  HGB 11.1* 10.7* 10.1* 10.4* 9.2*  HCT 34.0* 32.3* 30.8* 32.3* 29.8*  MCV 91.9 89.7 91.4 92.3 94.9  PLT 234 259 230 239 440   Basic Metabolic Panel: Recent Labs  Lab 04/09/20 0725 04/10/20 0522 04/11/20 0054 04/12/20 0601 04/13/20 0150  NA 135 137 135 137 137  K 3.3* 3.1* 4.1 3.3* 4.4  CL 115* 110 107 104 105  CO2 11* 15* 16* 23 22  GLUCOSE 179* 153* 125* 129* 98  BUN 50* 46* 42* 37* 38*  CREATININE 2.52* 2.39* 2.36* 2.26* 2.41*  CALCIUM 7.8* 7.8* 7.8* 7.7* 7.8*  MG  --  1.5* 1.9  --   --    GFR: Estimated Creatinine Clearance: 24.6 mL/min (A) (by C-G formula based on SCr of 2.41 mg/dL (H)). Liver Function Tests: Recent Labs  Lab 04/08/20 2120 04/09/20 0725  AST 15 16  ALT 17 16  ALKPHOS 70 62  BILITOT 0.5 0.5  PROT 6.7 5.7*  ALBUMIN 2.9* 2.4*   No results for input(s): LIPASE, AMYLASE in the last 168 hours. No results for input(s): AMMONIA in the last 168 hours. Coagulation Profile: Recent Labs  Lab 04/08/20 2153 04/09/20 0725 04/12/20 1651  INR 1.5* 1.5* 1.3*   Cardiac Enzymes: Recent Labs  Lab 04/08/20 2120  CKTOTAL 143   BNP (last 3 results) No results for input(s): PROBNP in the last 8760 hours. HbA1C: No results for input(s): HGBA1C in the last 72 hours. CBG: Recent Labs  Lab 04/13/20 0754 04/13/20 1154 04/13/20 1627 04/13/20 2102 04/14/20 0814  GLUCAP 83 93 105* 185* 111*   Lipid Profile: No results for input(s): CHOL, HDL, LDLCALC, TRIG, CHOLHDL, LDLDIRECT in the last 72 hours. Thyroid Function Tests: No results for input(s): TSH, T4TOTAL, FREET4, T3FREE, THYROIDAB in the last 72 hours. Anemia Panel: No results for input(s): VITAMINB12, FOLATE, FERRITIN, TIBC, IRON, RETICCTPCT in the last 72 hours. Sepsis  Labs: Recent Labs  Lab 04/08/20 2120 04/08/20 2250 04/09/20 0411 04/09/20 0725 04/09/20 0943  PROCALCITON  --   --   --  9.21  --   LATICACIDVEN 2.2* 3.0* 1.0  --  1.6    Recent Results (from the past 240 hour(s))  Respiratory Panel by RT PCR (Flu A&B, Covid) - Nasopharyngeal Swab     Status: None   Collection Time: 04/08/20  9:00 PM   Specimen: Nasopharyngeal Swab  Result Value Ref Range Status   SARS Coronavirus 2 by RT PCR NEGATIVE NEGATIVE Final    Comment: (NOTE) SARS-CoV-2 target nucleic acids are NOT DETECTED.  The SARS-CoV-2 RNA is generally detectable in upper respiratoy specimens during the acute phase of infection. The lowest concentration of SARS-CoV-2 viral copies this assay can detect is 131 copies/mL. A negative result does not preclude SARS-Cov-2 infection and should not be used as the sole basis for treatment or other patient management decisions. A negative result may occur with  improper specimen collection/handling, submission of specimen other than nasopharyngeal swab, presence of viral mutation(s) within the areas targeted by this assay, and inadequate number of viral copies (<131 copies/mL). A negative result must be combined with clinical observations, patient history, and epidemiological information. The expected result is Negative.  Fact Sheet for Patients:  PinkCheek.be  Fact Sheet for Healthcare Providers:  GravelBags.it  This test  is no t yet approved or cleared by the [place] and  has been authorized for detection and/or diagnosis of SARS-CoV-2 by FDA under an Emergency Use Authorization (EUA). This EUA will remain  in effect (meaning this test can be used) for the duration of the COVID-19 declaration under Section 564(b)(1) of the Act, 21 U.S.C. section 360bbb-3(b)(1), unless the authorization is terminated or revoked sooner.     Influenza A by PCR NEGATIVE NEGATIVE Final    Influenza B by PCR NEGATIVE NEGATIVE Final    Comment: (NOTE) The Xpert Xpress SARS-CoV-2/FLU/RSV assay is intended as an aid in  the diagnosis of influenza from Nasopharyngeal swab specimens and  should not be used as a sole basis for treatment. Nasal washings and  aspirates are unacceptable for Xpert Xpress SARS-CoV-2/FLU/RSV  testing.  Fact Sheet for Patients: PinkCheek.be  Fact Sheet for Healthcare Providers: GravelBags.it  This test is not yet approved or cleared by the [place] FDA and  has been authorized for detection and/or diagnosis of SARS-CoV-2 by  FDA under an Emergency Use Authorization (EUA). This EUA will remain  in effect (meaning this test can be used) for the duration of the  Covid-19 declaration under Section 564(b)(1) of the Act, 21  U.S.C. section 360bbb-3(b)(1), unless the authorization is  terminated or revoked. Performed at [facility], [place] [address] [place], [place] [address]   Blood Culture (routine x 2)     Status: Abnormal (Preliminary result)   Collection Time: 04/08/20  9:25 PM   Specimen: BLOOD LEFT ARM  Result Value Ref Range Status   Specimen Description BLOOD LEFT ARM  Final   Special Requests   Final    BOTTLES DRAWN AEROBIC AND ANAEROBIC Blood Culture adequate volume   Culture  Setup Time   Final    GRAM POSITIVE COCCI IN CHAINS GRAM NEGATIVE RODS AEROBIC BOTTLE ONLY CRITICAL RESULT CALLED TO, READ BACK BY AND VERIFIED WITH: PHARMD F WILSON 04/09/20 AT 52 SK GRAM POSITIVE RODS GRAM NEGATIVE RODS ANAEROBIC BOTTLE ONLY CRITICAL RESULT CALLED TO, READ BACK BY AND VERIFIED WITH: Tillman Sers PHARMD 1700 04/10/20 A BROWNING    Culture (A)  Final    ENTEROCOCCUS FAECALIS PSEUDOMONAS AERUGINOSA PREVOTELLA MELANINOGENICA BETA LACTAMASE POSITIVE CULTURE REINCUBATED FOR BETTER GROWTH Performed at [facility], [place] [address], [place], [place] [address]    Report  Status PENDING  Incomplete   Organism ID, Bacteria PSEUDOMONAS AERUGINOSA  Final   Organism ID, Bacteria ENTEROCOCCUS FAECALIS  Final      Susceptibility   Enterococcus faecalis - MIC*    AMPICILLIN <=2 SENSITIVE Sensitive     VANCOMYCIN 2 SENSITIVE Sensitive     GENTAMICIN SYNERGY SENSITIVE Sensitive     * ENTEROCOCCUS FAECALIS   Pseudomonas aeruginosa - MIC*    CEFTAZIDIME 4 SENSITIVE Sensitive     CIPROFLOXACIN >=4 RESISTANT Resistant     GENTAMICIN <=1 SENSITIVE Sensitive     IMIPENEM 2 SENSITIVE Sensitive     PIP/TAZO 8 SENSITIVE Sensitive     CEFEPIME 2 SENSITIVE Sensitive     * PSEUDOMONAS AERUGINOSA  Urine culture     Status: Abnormal   Collection Time: 04/08/20  9:25 PM   Specimen: Urine, Clean Catch  Result Value Ref Range Status   Specimen Description URINE, CLEAN CATCH  Final   Special Requests   Final    NONE Performed at [facility], [place] [address] [place], [place] [address]  Culture MULTIPLE SPECIES PRESENT, SUGGEST RECOLLECTION (A)  Final   Report Status 04/10/2020 FINAL  Final  Blood Culture ID Panel (Reflexed)     Status: Abnormal   Collection Time: 04/08/20  9:25 PM  Result Value Ref Range Status   Enterococcus faecalis DETECTED (A) NOT DETECTED Final    Comment: CRITICAL RESULT CALLED TO, READ BACK BY AND VERIFIED WITH: PHARMD F WILSON 04/09/20 AT 1724 SK    Enterococcus Faecium NOT DETECTED NOT DETECTED Final   Listeria monocytogenes NOT DETECTED NOT DETECTED Final   Staphylococcus species NOT DETECTED NOT DETECTED Final   Staphylococcus aureus (BCID) NOT DETECTED NOT DETECTED Final   Staphylococcus epidermidis NOT DETECTED NOT DETECTED Final   Staphylococcus lugdunensis NOT DETECTED NOT DETECTED Final   Streptococcus species NOT DETECTED NOT DETECTED Final   Streptococcus agalactiae NOT DETECTED NOT DETECTED Final   Streptococcus pneumoniae NOT DETECTED NOT DETECTED Final   Streptococcus pyogenes NOT DETECTED NOT DETECTED Final    A.calcoaceticus-baumannii NOT DETECTED NOT DETECTED Final   Bacteroides fragilis NOT DETECTED NOT DETECTED Final   Enterobacterales NOT DETECTED NOT DETECTED Final   Enterobacter cloacae complex NOT DETECTED NOT DETECTED Final   Escherichia coli NOT DETECTED NOT DETECTED Final   Klebsiella aerogenes NOT DETECTED NOT DETECTED Final   Klebsiella oxytoca NOT DETECTED NOT DETECTED Final   Klebsiella pneumoniae NOT DETECTED NOT DETECTED Final   Proteus species NOT DETECTED NOT DETECTED Final   Salmonella species NOT DETECTED NOT DETECTED Final   Serratia marcescens NOT DETECTED NOT DETECTED Final   Haemophilus influenzae NOT DETECTED NOT DETECTED Final   Neisseria meningitidis NOT DETECTED NOT DETECTED Final   Pseudomonas aeruginosa DETECTED (A) NOT DETECTED Final    Comment: CRITICAL RESULT CALLED TO, READ BACK BY AND VERIFIED WITH: PHARMD F WILSON 04/09/20 AT 1724 SK    Stenotrophomonas maltophilia NOT DETECTED NOT DETECTED Final   Candida albicans NOT DETECTED NOT DETECTED Final   Candida auris NOT DETECTED NOT DETECTED Final   Candida glabrata NOT DETECTED NOT DETECTED Final   Candida krusei NOT DETECTED NOT DETECTED Final   Candida parapsilosis NOT DETECTED NOT DETECTED Final   Candida tropicalis NOT DETECTED NOT DETECTED Final   Cryptococcus neoformans/gattii NOT DETECTED NOT DETECTED Final   CTX-M ESBL NOT DETECTED NOT DETECTED Final   Carbapenem resistance IMP NOT DETECTED NOT DETECTED Final   Carbapenem resistance KPC NOT DETECTED NOT DETECTED Final   Carbapenem resistance NDM NOT DETECTED NOT DETECTED Final   Vancomycin resistance NOT DETECTED NOT DETECTED Final   Carbapenem resistance VIM NOT DETECTED NOT DETECTED Final    Comment: Performed at [facility], [address] [place], [place] [address]  Blood Culture (routine x 2)     Status: None   Collection Time: 04/08/20  9:34 PM   Specimen: BLOOD RIGHT HAND  Result Value Ref Range Status   Specimen Description BLOOD  RIGHT HAND  Final   Special Requests   Final    BOTTLES DRAWN AEROBIC AND ANAEROBIC Blood Culture results may not be optimal due to an inadequate volume of blood received in culture bottles   Culture   Final    NO GROWTH 5 DAYS Performed at [facility], [place] [address] [place], [place] [address]    Report Status 04/13/2020 FINAL  Final  Culture, blood (routine x 2)     Status: None (Preliminary result)   Collection Time: 04/11/20 11:50 AM   Specimen: BLOOD RIGHT HAND  Result Value  Ref Range Status   Specimen Description BLOOD RIGHT HAND  Final   Special Requests   Final    BOTTLES DRAWN AEROBIC AND ANAEROBIC Blood Culture adequate volume   Culture   Final    NO GROWTH 3 DAYS Performed at [facility], [address], [place], [place] [address]    Report Status PENDING  Incomplete  Culture, blood (routine x 2)     Status: None (Preliminary result)   Collection Time: 04/11/20 12:00 PM   Specimen: BLOOD RIGHT ARM  Result Value Ref Range Status   Specimen Description BLOOD RIGHT ARM  Final   Special Requests   Final    BOTTLES DRAWN AEROBIC AND ANAEROBIC Blood Culture adequate volume   Culture   Final    NO GROWTH 3 DAYS Performed at [facility], [place] [address], [place], [place] [address]    Report Status PENDING  Incomplete  Aerobic/Anaerobic Culture (surgical/deep wound)     Status: None (Preliminary result)   Collection Time: 04/13/20 10:00 AM   Specimen: Fluid; Urine  Result Value Ref Range Status   Specimen Description FLUID  Final   Special Requests LEFT KIDNEY  Final   Gram Stain   Final    ABUNDANT WBC PRESENT,BOTH PMN AND MONONUCLEAR ABUNDANT GRAM POSITIVE COCCI    Culture   Final    ABUNDANT GRAM NEGATIVE RODS CULTURE REINCUBATED FOR BETTER GROWTH Performed at [facility], [place] [address] [place], [place] [address]    Report Status PENDING  Incomplete      Radiology Studies: IR NEPHROSTOMY PLACEMENT LEFT  Result Date:  04/13/2020 INDICATION: 84 year old male with a history of obstructing left UPJ stone, referred for percutaneous drainage EXAM: IR NEPHROSTOMY PLACEMENT LEFT COMPARISON:  None. MEDICATIONS: Ciprofloxacin 400 mg IV; The antibiotic was administered in an appropriate time frame prior to skin puncture. ANESTHESIA/SEDATION: Fentanyl 75 mcg IV; Versed 1.0 mg IV Moderate Sedation Time:  13 minutes The patient was continuously monitored during the procedure by the interventional radiology nurse under my direct supervision. CONTRAST:  40m OMNIPAQUE IOHEXOL 300 MG/ML SOLN - administered into the collecting system(s) FLUOROSCOPY TIME:  Fluoroscopy Time: 1 minutes 48 seconds (6 mGy). COMPLICATIONS: None PROCEDURE: Informed written consent was obtained from the patient after a thorough discussion of the procedural risks, benefits and alternatives. All questions were addressed. Maximal Sterile Barrier Technique was utilized including caps, mask, sterile gowns, sterile gloves, sterile drape, hand hygiene and skin antiseptic. A timeout was performed prior to the initiation of the procedure. Patient positioned prone position on the fluoroscopy table. Ultrasound survey of the left flank was performed with images stored and sent to PACs. The patient was then prepped and draped in the usual sterile fashion. 1% lidocaine was used to anesthetize the skin and subcutaneous tissues for local anesthesia. A Chiba needle was then used to access a posterior inferior calyx with ultrasound guidance. With spontaneous urine returned through the needle, passage of an 018 micro wire into the collecting system was performed under fluoroscopy. A small incision was made with an 11 blade scalpel, and the needle was removed from the wire. An Accustick system was then advanced over the wire into the collecting system under fluoroscopy. The metal stiffener and inner dilator were removed, and then a sample of fluid was aspirated through the 4 French outer  sheath. Bentson wire was passed into the collecting system and the sheath removed. Ten French dilation of the soft tissues was performed. Using  modified Seldinger technique, a 10 French pigtail catheter drain was placed over the Bentson wire. Wire and inner stiffener removed, and the pigtail was formed in the collecting system. Small amount of contrast confirmed position of the catheter. Patient tolerated the procedure well and remained hemodynamically stable throughout. No complications were encountered and no significant blood loss encountered FINDINGS: Frankly purulent urine.  Sample was sent IMPRESSION: Status post image guided placement of left percutaneous nephrostomy. Sample was sent for culture. Signed, Dulcy Fanny. Dellia Nims, RPVI [facility] Electronically Signed   By: Corrie Mckusick D.O.   On: 04/13/2020 10:49    Scheduled Meds: . feeding supplement (GLUCERNA SHAKE)  237 mL Oral TID BM  . ferrous sulfate  325 mg Oral Q breakfast  . heparin  5,000 Units Subcutaneous Q8H  . insulin aspart  0-5 Units Subcutaneous QHS  . insulin aspart  0-9 Units Subcutaneous TID WC  . insulin glargine  5 Units Subcutaneous QHS  . loratadine  10 mg Oral Daily  . multivitamin with minerals  1 tablet Oral Daily  . omega-3 acid ethyl esters  1 g Oral BID  . pravastatin  20 mg Oral Daily  . sodium chloride flush  5 mL Intracatheter Q8H   Continuous Infusions: . sodium chloride 75 mL/hr at 04/12/20 2144  . piperacillin-tazobactam (ZOSYN)  IV 3.375 g (04/14/20 0849)     LOS: 5 days   Time spent: 29 minutes   Darliss Cheney, MD [facility]  04/14/2020, 11:15 AM   To contact the attending provider between 7A-7P or the covering provider during after hours 7P-7A, please log into the web site [email or web address].

## 2020-04-14 NOTE — Progress Notes (Signed)
Referring Physician(s): Dr. Abner Greenspan  Supervising Physician: Corrie Mckusick  Patient Status:  [facility] - In-pt  Chief Complaint: Follow up left PCN placed 10/1 in IR  Subjective:  Patient seated in recliner eating lunch, family member at bedside. He has some soreness at drain insertion site but overall feels well - asking to get up and use the restroom.  Allergies: Other  Medications: Prior to Admission medications   Medication Sig Start Date End Date Taking? Authorizing Provider  albuterol (VENTOLIN HFA) 108 (90 Base) MCG/ACT inhaler Inhale 2 puffs into the lungs every 6 (six) hours as needed. Patient taking differently: Inhale 2 puffs into the lungs every 6 (six) hours as needed for wheezing or shortness of breath.  07/22/19  Yes Saguier, Percell Miller, PA-C  cetirizine (ZYRTEC) 10 MG tablet Take 1 tablet (10 mg total) by mouth daily. Patient taking differently: Take 10 mg by mouth daily as needed for allergies or rhinitis.  02/18/19  Yes McGowen, [name], MD  Continuous Blood Gluc Sensor (FREESTYLE LIBRE 14 DAY SENSOR) MISC USE AS DIRECTED Patient taking differently: Inject 1 patch into the skin every 14 (fourteen) days.  04/05/19  Yes McGowen, [name], MD  Ferrous Sulfate 27 MG TABS Take 27 mg by mouth daily with breakfast.   Yes [provider]  Omega-3 Fatty Acids (FISH OIL PO) Take 1 capsule by mouth daily.    Yes [provider]  pravastatin (PRAVACHOL) 20 MG tablet TAKE 1 TABLET BY MOUTH EVERY DAY Patient taking differently: Take 20 mg by mouth daily.  10/31/19  Yes McGowen, [name], MD  aspirin EC 81 MG tablet Take 1 tablet (81 mg total) by mouth daily. Patient not taking: Reported on 04/08/2020 05/17/19   Tammi Sou, MD  azithromycin (ZITHROMAX) 250 MG tablet Take 2 tablets by mouth on day 1, followed by 1 tablet by mouth daily for 4 days. Patient not taking: Reported on 04/08/2020 07/22/19   Saguier, Percell Miller, PA-C  benzonatate (TESSALON) 100 MG capsule Take 1 capsule  (100 mg total) by mouth 3 (three) times daily as needed for cough. Patient not taking: Reported on 04/08/2020 07/22/19   Saguier, Percell Miller, PA-C  FERROUS SULFATE PO Take 27 mg by mouth. Takes 1 daily Patient not taking: Reported on 04/08/2020    [provider]  insulin aspart (NOVOLOG FLEXPEN) 100 UNIT/ML FlexPen CBG < 70: implement hypoglycemia protocol CBG 70 - 120: 3 units CBG 121 - 150: 5 units CBG 151 - 200: 6 units CBG 201 - 250: 8 units CBG 251 - 300: 11 units CBG 301 - 350: 14 units CBG 351 - 400: 18 units CBG > 400: call MD 02/04/18   Tammi Sou, MD  Insulin Glargine (LANTUS SOLOSTAR) 100 UNIT/ML Solostar Pen INJECT 5 UNITS SUBQ AT BEDTIME Patient taking differently: Inject 5 Units into the skin at bedtime. INJECT 5 UNITS SUBQ AT BEDTIME 04/07/19   McGowen, [name], MD  Insulin Pen Needle (PEN NEEDLES) 32G X 4 MM MISC 1 each by Does not apply route daily. 10/07/17   McGowen, [name], MD  Insulin Pen Needle 31G X 5 MM MISC Inject insulin via insulin pen 3 x daily with meals 11/17/17   McGowen, [name], MD  Lancets 30G MISC Use to check blood sugar 4 times daily 01/21/18   McGowen, [name], MD  lisinopril (ZESTRIL) 20 MG tablet Take 1 tablet (20 mg total) by mouth 2 (two) times daily. Patient not taking: Reported on 04/08/2020  05/17/19   McGowen, [name], MD     Vital Signs: BP (!) 97/54 (BP Location: Left Wrist)   Pulse 62   Temp 98 F (36.7 C) (Oral)   Resp 20   Ht 5\' 7"  (1.702 m)   Wt 187 lb 6.3 oz (85 kg)   SpO2 98%   BMI 29.35 kg/m   Physical Exam Vitals and nursing note reviewed.  Constitutional:      General: He is not in acute distress. Cardiovascular:     Rate and Rhythm: Normal rate.  Pulmonary:     Effort: Pulmonary effort is normal.  Abdominal:     Palpations: Abdomen is soft.  Genitourinary:    Comments: (+) left PCN to gravity with ~500 cc of cloudy yellow urine, some purulent material present. No blood noted in bag or at insertion. No  leakage around insertion site. Appropriately tender to palpation of insertion site. Skin:    General: Skin is warm and dry.  Neurological:     Mental Status: He is alert. Mental status is at baseline.     Imaging: MR ABDOMEN WO CONTRAST  Result Date: 04/12/2020 CLINICAL DATA:  Renal mass, renal insufficiency EXAM: MRI ABDOMEN WITHOUT CONTRAST TECHNIQUE: Multiplanar multisequence MR imaging was performed without the administration of intravenous contrast. COMPARISON:  CT of the abdomen and pelvis dated December 19, 2017 FINDINGS: Lower chest: No sign of consolidation or pleural effusion. Limited assessment of lung bases on MRI. Hepatobiliary: Limited assessment on noncontrast imaging of the liver. No visible lesion with suspicious features. No pericholecystic stranding or sign of biliary duct dilation. Pancreas: 13 mm lesion in the head of the pancreas with cystic features. Mild main duct distension grossly similar to previous imaging with ectatic side branches. The cystic lesion in the head of the pancreas is unchanged compared to Dec 04, 2018. Loss of normal intrinsic T1 hyperintensity within the pancreatic parenchyma is similar to previous imaging Spleen: Spleen normal in size and contour without focal lesion. Limited assessment without contrast. Adrenals/Urinary Tract:  Adrenal glands are grossly normal. Moderate LEFT-sided hydroureteronephrosis. In the LEFT renal pelvis is an area of low T2 signal measuring approximately 10 x 6 mm. Extensive inflammatory changes surround the LEFT renal pelvis. There is distension of LEFT renal collecting systems as described, some of this distension showing intermediate rather than high T2 signal without high T1 signal and with signs of profound restricted diffusion. Mixed signal on diffusion is noted with a more central area in the renal sinus measuring approximately 3.1 x 0.9 cm displaying less diffusion than material that fills the peripheral collecting systems.  Striated parenchymal signal on T2 weighted imaging and on diffusion RIGHT kidney with potential calculus in the lower pole best seen a susceptibility on image 25 of series 12. This is nonspecific. Mild fullness of the RIGHT ureter without overt hydronephrosis in the setting of ileal conduit following cysto prostatectomy. Stomach/Bowel: Limited assessment of gastrointestinal structures. RIGHT lower quadrant ileal conduit ostomy. Small bowel, likely a portion of the conduit contained within a small ventral hernia is imaged only on coronal images, grossly unchanged accounting for incomplete visualization as compared to December 19, 2017. Study not performed for bowel evaluation and therefore with limited assessment of bowel. Vascular/Lymphatic: Vascular structures in the abdomen not well assessed with signs of atheromatous plaque. Mildly enlarged lymph nodes along the LEFT periaortic chain, largest 13 mm on image 66 of series 10. Other: Generalized mild body wall edema. No visible ascites. No perinephric collection. Musculoskeletal:  No focal, suspicious bone lesion visualized. IMPRESSION: 1. Interval migration of a calculus (approximately 10 x 6 mm) from the peripheral collecting systems of the LEFT kidney into the renal pelvis with surrounding inflammation and associated moderate hydronephrosis. 2. Material filling calices and collecting system elements may represent tumor or infectious debris, urologic consultation with retrograde evaluation as warranted is suggested. Noncontrast CT could be performed for confirmation of renal pelvic calculus if desired. 3. Striated appearance of parenchyma on the LEFT on T2 weighted imaging suggests concomitant pyelonephritis, combined with central obstructing calculus as described. 4. Mild fullness of the RIGHT ureter without overt hydronephrosis in the setting of ileal conduit following cysto prostatectomy. 5. Mildly enlarged LEFT periaortic lymph nodes, nonspecific. 6. 13 mm cystic  lesion in the head of the pancreas with cystic features. This is unchanged compared to previous imaging and may represent a side branch IPMN. Attention on follow-up. MRCP at 2 year interval could be performed as warranted for further assessment These results will be called to the ordering clinician or representative by the Radiologist Assistant, and communication documented in the PACS or [employer]. Electronically Signed   By: Zetta Bills M.D.   On: 04/12/2020 08:18   US RENAL  Result Date: 04/11/2020 CLINICAL DATA:  Acute kidney injury. EXAM: RENAL / URINARY TRACT ULTRASOUND COMPLETE COMPARISON:  Dec 04, 2018.  December 19, 2017 FINDINGS: Right Kidney: Renal measurements: 11.5 x 5.5 x 5.0 cm = volume: 166 mL. Mild hydronephrosis is noted. Echogenicity within normal limits. No mass visualized. Left Kidney: Renal measurements: 11.8 x 6.7 x 6.0 cm = volume: 248 mL. Echogenicity within normal limits. Mild hydronephrosis is noted. Lobulated echogenic abnormality is noted in the left renal pelvis. It is nonshadowing and measures approximately 2.5 x 3.0 cm. Bladder: Surgically removed due to cancer. Other: None. IMPRESSION: 1. Mild bilateral hydronephrosis is noted. 2. 3 cm lobular echogenic abnormality is noted in the left renal pelvis which is concerning for possible mass or malignancy. CT scan or MRI scan with and without contrast administration is recommended for further evaluation. Electronically Signed   By: Marijo Conception M.D.   On: 04/11/2020 13:21   IR NEPHROSTOMY PLACEMENT LEFT  Result Date: 04/13/2020 INDICATION: 84 year old male with a history of obstructing left UPJ stone, referred for percutaneous drainage EXAM: IR NEPHROSTOMY PLACEMENT LEFT COMPARISON:  None. MEDICATIONS: Ciprofloxacin 400 mg IV; The antibiotic was administered in an appropriate time frame prior to skin puncture. ANESTHESIA/SEDATION: Fentanyl 75 mcg IV; Versed 1.0 mg IV Moderate Sedation Time:  13 minutes The patient was  continuously monitored during the procedure by the interventional radiology nurse under my direct supervision. CONTRAST:  57mL OMNIPAQUE IOHEXOL 300 MG/ML SOLN - administered into the collecting system(s) FLUOROSCOPY TIME:  Fluoroscopy Time: 1 minutes 48 seconds (6 mGy). COMPLICATIONS: None PROCEDURE: Informed written consent was obtained from the patient after a thorough discussion of the procedural risks, benefits and alternatives. All questions were addressed. Maximal Sterile Barrier Technique was utilized including caps, mask, sterile gowns, sterile gloves, sterile drape, hand hygiene and skin antiseptic. A timeout was performed prior to the initiation of the procedure. Patient positioned prone position on the fluoroscopy table. Ultrasound survey of the left flank was performed with images stored and sent to PACs. The patient was then prepped and draped in the usual sterile fashion. 1% lidocaine was used to anesthetize the skin and subcutaneous tissues for local anesthesia. A Chiba needle was then used to access a posterior inferior calyx with ultrasound guidance.  With spontaneous urine returned through the needle, passage of an 018 micro wire into the collecting system was performed under fluoroscopy. A small incision was made with an 11 blade scalpel, and the needle was removed from the wire. An Accustick system was then advanced over the wire into the collecting system under fluoroscopy. The metal stiffener and inner dilator were removed, and then a sample of fluid was aspirated through the 4 French outer sheath. Bentson wire was passed into the collecting system and the sheath removed. Ten French dilation of the soft tissues was performed. Using modified Seldinger technique, a 10 French pigtail catheter drain was placed over the Bentson wire. Wire and inner stiffener removed, and the pigtail was formed in the collecting system. Small amount of contrast confirmed position of the catheter. Patient tolerated  the procedure well and remained hemodynamically stable throughout. No complications were encountered and no significant blood loss encountered FINDINGS: Frankly purulent urine.  Sample was sent IMPRESSION: Status post image guided placement of left percutaneous nephrostomy. Sample was sent for culture. Signed, Dulcy Fanny. Dellia Nims, RPVI [facility] Electronically Signed   By: Corrie Mckusick D.O.   On: 04/13/2020 10:49    Labs:  CBC: Recent Labs    04/11/20 0054 04/12/20 0601 04/13/20 0150 04/14/20 0125  WBC 11.4* 8.8 8.4 19.3*  HGB 10.7* 10.1* 10.4* 9.2*  HCT 32.3* 30.8* 32.3* 29.8*  PLT 259 230 239 208    COAGS: Recent Labs    04/08/20 2153 04/09/20 0725 04/12/20 1651  INR 1.5* 1.5* 1.3*  APTT 30  --   --     BMP: Recent Labs    04/11/20 0054 04/12/20 0601 04/13/20 0150 04/14/20 1055  NA 135 137 137 138  K 4.1 3.3* 4.4 3.9  CL 107 104 105 105  CO2 16* 23 22 21*  GLUCOSE 125* 129* 98 147*  BUN 42* 37* 38* 36*  CALCIUM 7.8* 7.7* 7.8* 8.0*  CREATININE 2.36* 2.26* 2.41* 2.58*  GFRNONAA 25* 26* 24* 22*  GFRAA 29* 30* 28* 26*    LIVER FUNCTION TESTS: Recent Labs    04/08/20 2120 04/09/20 0725  BILITOT 0.5 0.5  AST 15 16  ALT 17 16  ALKPHOS 70 62  PROT 6.7 5.7*  ALBUMIN 2.9* 2.4*    Assessment and Plan:  84 y/o M s/p left PCN placement 10/1 in IR seen today for site check post procedure.  Patient has minimal pain at insertion site which is to be expected. On my exam there is ~500 cc of cloudy, yellow urine with some purulent material present and I/O shows appropriate output since placement. No leakage or bleeding around insertion site.  Continue current management of PCN, further plans per urology/primary team.   IR remains available as needed - please call with questions or concerns.  Electronically Signed: Joaquim Nam, PA-C 04/14/2020, 4:02 PM   I spent a total of 15 Minutes at the  the patient's bedside AND on the patient's hospital floor or unit, greater than 50% of which was counseling/coordinating care for left PCN follow up.

## 2020-04-14 NOTE — Evaluation (Signed)
Occupational Therapy Evaluation Patient Details Name: Shane [name] MRN: [id number] DOB: 11-15-1935 Today's Date: 04/14/2020    History of Present Illness 84yo male who recently fell at home; recent history of Covid January 2021 and has been weak ever since but it has been getting worse. CTH negative, but imaging did find L renal mass pending w/u. Admitted with severe sepsis secondary to UTI. PMH moderate aortic stenosis, HTN, HLD, hx Bells palsy, DM, CVA,  bladder cancer, cardiac cath, urostomy   Clinical Impression   PTA, pt was living at home with his wife and daughter, pt was independent with ADL/IADL and reports using RW intermittently in the home and using a walking cane outside. Pt reports history of falls. Pt currently requires minguard for functional mobility at RW level. He requires supervision-minguard assistance for LB dressing and grooming. Pt demonstrates decreased activity tolerance and generalized weakness impacting his safety and independence with ADL/IADL and functional mobiltiy. Due to decline in current level of function, pt would benefit from acute OT to address established goals to facilitate safe D/C to venue listed below. At this time, recommend HHOT follow-up. Will continue to follow acutely.     Follow Up Recommendations  Home health OT;Supervision - Intermittent (with mobility)    Equipment Recommendations  None recommended by OT    Recommendations for Other Services       Precautions / Restrictions Precautions Precautions: Fall Precaution Comments: urostomy R abdomen Restrictions Weight Bearing Restrictions: No      Mobility Bed Mobility               General bed mobility comments: pt sitting in recliner upon arrival  Transfers Overall transfer level: Needs assistance Equipment used: Rolling walker (2 wheeled) Transfers: Sit to/from Stand Sit to Stand: Min guard         General transfer comment: min guard for safety, mildly unsteady  but able to correct    Balance Overall balance assessment: History of Falls;Needs assistance Sitting-balance support: Feet supported Sitting balance-Leahy Scale: Good     Standing balance support: No upper extremity supported;During functional activity Standing balance-Leahy Scale: Fair Standing balance comment: able to static stand without UE support, noted instability;improved stability with use of RW during ambulation                           ADL either performed or assessed with clinical judgement   ADL Overall ADL's : Needs assistance/impaired Eating/Feeding: Set up;Sitting   Grooming: Supervision/safety;Standing   Upper Body Bathing: Set up;Sitting   Lower Body Bathing: Supervison/ safety;Sit to/from stand   Upper Body Dressing : Set up;Sitting   Lower Body Dressing: Supervision/safety;Sit to/from stand Lower Body Dressing Details (indicate cue type and reason): pt able to [employer] Transfer: Min guard;Ambulation;RW Toilet Transfer Details (indicate cue type and reason): simulated  Toileting- Clothing Manipulation and Hygiene: Supervision/safety;Sit to/from stand       Functional mobility during ADLs: Min guard;Rolling walker;Cueing for safety General ADL Comments: pt requires minguard for safety, mild instability noted during ambulation and noted pt to fatigue after walking 77min. Educated pt on importance of mobility progressing and safe use of RW;began education on fall prevention     Vision         Perception     Praxis      Pertinent Vitals/Pain Pain Assessment: No/denies pain     Hand Dominance Right   Extremity/Trunk Assessment Upper Extremity Assessment Upper Extremity Assessment: Generalized weakness  Lower Extremity Assessment Lower Extremity Assessment: Generalized weakness   Cervical / Trunk Assessment Cervical / Trunk Assessment: Kyphotic   Communication Communication Communication: No difficulties   Cognition  Arousal/Alertness: Awake/alert Behavior During Therapy: WFL for tasks assessed/performed Overall Cognitive Status: Within Functional Limits for tasks assessed                                     General Comments  urostomy bag not draining properly, notified RN     Exercises     Shoulder Instructions      Home Living Family/patient expects to be discharged to:: Private residence Living Arrangements: Spouse/significant other;Children;Other (Comment) (daughter and son-in law, wife, grandkids) Available Help at Discharge: Family;Available 24 hours/day Type of Home: House Home Access: Stairs to enter [employer] of Steps: 20 steps at front with B railings (cannot reach both at same time), 3 in back with no rails Entrance Stairs-Rails: Right;Left Home Layout: Multi-level Alternate Level Stairs-Number of Steps: lives on main level; daughter and son in law live in basement, grandkids stay upstairs   Bathroom Shower/Tub: Teacher, early years/pre: Standard Bathroom Accessibility: Yes How Accessible: Accessible via walker Home Equipment: Walker - 2 wheels;Grab bars - toilet;Bedside commode;Other (comment) (walking stick for outside)   Additional Comments: has equipment but doesn't really use it      Prior Functioning/Environment Level of Independence: Independent with assistive device(s)        Comments: no device inside/walking stick outside but does have history of falls; does sometimes need help with urostomy when not feeling well        OT Problem List: Decreased activity tolerance;Impaired balance (sitting and/or standing);Decreased safety awareness;Decreased knowledge of use of DME or AE      OT Treatment/Interventions: Self-care/ADL training;Therapeutic exercise;Energy conservation;DME and/or AE instruction;Therapeutic activities;Patient/family education;Balance training    OT Goals(Current goals can be found in the care plan section)  Acute Rehab OT Goals Patient Stated Goal: to return to prior level of functioning OT Goal Formulation: With patient Time For Goal Achievement: 04/28/20 Potential to Achieve Goals: Good ADL Goals Pt Will Perform Grooming: with modified independence;standing Pt Will Perform Lower Body Dressing: with modified independence;sit to/from stand Pt Will Transfer to Toilet: with modified independence;ambulating Additional ADL Goal #1: Pt will verbalize 3-5 fall prevention strategies.  OT Frequency: Min 2X/week   Barriers to D/C:            Co-evaluation              AM-PAC OT "6 Clicks" Daily Activity     Outcome Measure Help from another person eating meals?: None Help from another person taking care of personal grooming?: A Little Help from another person toileting, which includes using toliet, bedpan, or urinal?: A Little Help from another person bathing (including washing, rinsing, drying)?: A Little Help from another person to put on and taking off regular upper body clothing?: None Help from another person to put on and taking off regular lower body clothing?: A Little 6 Click Score: 20   End of Session Equipment Utilized During Treatment: Gait belt;Rolling walker Nurse Communication: Mobility status  Activity Tolerance: Patient tolerated treatment well Patient left: in chair;with call bell/phone within reach;with family/visitor present  OT Visit Diagnosis: Unsteadiness on feet (R26.81);Other abnormalities of gait and mobility (R26.89);Muscle weakness (generalized) (M62.81);History of falling (Z91.81)  Time: 2003-7944 OT Time Calculation (min): 40 min Charges:  OT General Charges $OT Visit: 1 Visit OT Evaluation $OT Eval Moderate Complexity: 1 Mod OT Treatments $Self Care/Home Management : 23-37 mins  Helene Kelp OTR/L Acute Rehabilitation Services Office: [phone]   Wyn Forster 04/14/2020, 3:11 PM

## 2020-04-15 ENCOUNTER — Inpatient Hospital Stay (HOSPITAL_COMMUNITY): Payer: Medicare Other

## 2020-04-15 LAB — CBC WITH DIFFERENTIAL/PLATELET
Abs Immature Granulocytes: 0.1 10*3/uL — ABNORMAL HIGH (ref 0.00–0.07)
Basophils Absolute: 0.1 10*3/uL (ref 0.0–0.1)
Basophils Relative: 1 %
Eosinophils Absolute: 0.3 10*3/uL (ref 0.0–0.5)
Eosinophils Relative: 2 %
HCT: 34.1 % — ABNORMAL LOW (ref 39.0–52.0)
Hemoglobin: 10.8 g/dL — ABNORMAL LOW (ref 13.0–17.0)
Immature Granulocytes: 1 %
Lymphocytes Relative: 20 %
Lymphs Abs: 2.2 10*3/uL (ref 0.7–4.0)
MCH: 30.3 pg (ref 26.0–34.0)
MCHC: 31.7 g/dL (ref 30.0–36.0)
MCV: 95.8 fL (ref 80.0–100.0)
Monocytes Absolute: 0.7 10*3/uL (ref 0.1–1.0)
Monocytes Relative: 6 %
Neutro Abs: 7.6 10*3/uL (ref 1.7–7.7)
Neutrophils Relative %: 70 %
Platelets: 261 10*3/uL (ref 150–400)
RBC: 3.56 MIL/uL — ABNORMAL LOW (ref 4.22–5.81)
RDW: 14.8 % (ref 11.5–15.5)
WBC: 10.9 10*3/uL — ABNORMAL HIGH (ref 4.0–10.5)
nRBC: 0 % (ref 0.0–0.2)

## 2020-04-15 LAB — BASIC METABOLIC PANEL
Anion gap: 11 (ref 5–15)
BUN: 34 mg/dL — ABNORMAL HIGH (ref 8–23)
CO2: 20 mmol/L — ABNORMAL LOW (ref 22–32)
Calcium: 7.9 mg/dL — ABNORMAL LOW (ref 8.9–10.3)
Chloride: 105 mmol/L (ref 98–111)
Creatinine, Ser: 2.27 mg/dL — ABNORMAL HIGH (ref 0.61–1.24)
GFR calc Af Amer: 30 mL/min — ABNORMAL LOW (ref >60)
GFR calc non Af Amer: 26 mL/min — ABNORMAL LOW (ref >60)
Glucose, Bld: 142 mg/dL — ABNORMAL HIGH (ref 70–99)
Potassium: 4.4 mmol/L (ref 3.5–5.1)
Sodium: 136 mmol/L (ref 135–145)

## 2020-04-15 LAB — CULTURE, BLOOD (ROUTINE X 2): Special Requests: ADEQUATE

## 2020-04-15 LAB — GLUCOSE, CAPILLARY
Glucose-Capillary: 126 mg/dL — ABNORMAL HIGH (ref 70–99)
Glucose-Capillary: 148 mg/dL — ABNORMAL HIGH (ref 70–99)
Glucose-Capillary: 147 mg/dL — ABNORMAL HIGH (ref 70–99)
Glucose-Capillary: 139 mg/dL — ABNORMAL HIGH (ref 70–99)
Glucose-Capillary: 188 mg/dL — ABNORMAL HIGH (ref 70–99)

## 2020-04-15 NOTE — Progress Notes (Signed)
PROGRESS NOTE    Shane [name]  SFK:[id number] DOB: [birth year] DOA: 04/08/2020 PCP: Tammi Sou, MD   Brief Narrative:  Shane [name] is a 84 y.o. male with medical history significant of bladder cancer status post Ileal conduit, calcific pancreatitis, chronic kidney disease stage IIIa, coronary artery disease, aortic stenosis hypertension, hyperlipidemia, recurring UTIs, recurrent GI bleeds, history of CVA, diabetes, duodenal ulcer who presented to the ER with recurrent falls, fever, weakness and altered mental status.Patient apparently was trying to go to the bathroom yesterday when he had a fall. He had COVID-19 in January which he recovered from but has had 3 other episodes of pneumonia since then he has continued to be weak and debilitated.  His weakness gotten worse in the last 2 to 3 days.  He came to the ER where he was found to have Temperature 100.9 blood pressure 84/41 pulse 97 respirate 25 oxygen sat 96% room air.  Venous pH is 7.196.  Sodium 132 potassium 3.6 chloride 111 CO2 obtain glucose 200 BUN 54 creatinine 2.58 and calcium 7.9.  CK is 143 lactic acid 3.0 white count 12.8 hemoglobin 11.8.  Platelets 285.  PT 17.2 INR 1.5.  Urinalysis showed cloudy urine with many bacteria WBC more than 50 RBC 6-10 large leukocytes.  Chest x-ray showed subsegmental opacities at the left lung base probably atelectasis or scarring possible pneumonia.  Head CT without contrast showed no acute findings and met criteria for sepsis secondary to UTI and possible pneumonia.  He additionally has features of endorgan damage/AKI on CKD stage IIIa making him have severe sepsis.  He was admitted under hospital service.  Started on broad-spectrum IV antibiotics as well as IV fluids.  Subsequently his blood culture grew Enterococcus faecalis as well as Pseudomonas.  ID was consulted.  His antibiotics were switched to ampicillin and cefepime and then to Zosyn.  His creatinine improved but very slowly.   Further history was provided by the daughter that he was also complaining of left flank pain prior to his fall.  This prompted ultrasound kidney which showed suspicion of possible left renal mass.  Radiology recommended CT or MRI with and without contrast however due to elevated creatinine/AKI, we could not perform contrasted study.  I had a long discussion with radiology who recommended MRI without contrast which was done on 04/12/2020 which shows large left renal pelvis stone measuring 10 x 6 mm.  Causing hydronephrosis and pyelonephritis.  Urology was consulted.  They intend consulted IR and patient underwent PCN on the left on 04/13/2020.  Assessment & Plan:   Principal Problem:   Severe sepsis ([place]) Active Problems:   Hypertension   Diabetes mellitus without complication ([place])   UTI (urinary tract infection)   Malignant neoplasm of urinary bladder (HCC)   S/P ileal conduit (HCC)   AKI (acute kidney injury) ([place])   Lobar pneumonia (HCC)   #1 severe sepsis secondary to UTI/left pyelonephritis/community-acquired pneumonia/gram-negative bacteremia: Patient met severe sepsis criteria based on fever, leukocytosis and tachypnea and endorgan damage of acute renal failure and lactic acid of 3.  Blood culture growing Enterococcus faecalis and Pseudomonas aeruginosa.  ID was also consulted. antibiotics were switched to ampicillin and cefepime and then to Zosyn on 04/11/2020.  Sepsis physiology is improving.  S/p left PCN 04/13/2020 by IR.  Leukocytosis improved.  He remains afebrile.  Awaiting ID to see him and provide further recommendations.  Sent a message to Dr. Graylon Good.  #2 acute kidney injury on chronic kidney disease  stage IIIa/dehydration/left renal stone/left pyelonephritis/left hydronephrosis: Secondary to sepsis/ATN. Creatinine only slightly improved again today but not as much as I had expected.  After receiving more history from the daughter yesterday where she told me that patient was  complaining of left flank pain before the fall, ultrasound kidney was done which showed suspicion of left renal mass. Radiology recommended CT or MRI with and without contrast however due to elevated creatinine/AKI, we could not perform contrasted study.  I had a long discussion with radiology who recommended MRI without contrast which was done on 04/12/2020 which shows large left renal pelvis stone measuring 10 x 6 mm.  Causing hydronephrosis and pyelonephritis.  Now s/p left PCN by IR.  Creatinine is still not improving per expectorations.  Discussed with Dr. Claudia Desanctis of urology today.  Ordered CT abdomen pelvis without contrast per her recommendations.  CT shows no hydronephrosis.  Will wait 1 more day, repeat BMP tomorrow morning.  If no improvement, will consult nephrology.  #3  Non-anion gap metabolic acidosis: Likely due to AKI.  Initially resolved but CO2 going down again.  Will watch closely.  #4.  Hypokalemia: Resolved  #6 history of urinary bladder cancer: Patient has had surgery on treatment.  Defer to his oncologist.  #7 diabetes type II: Continue with sliding scale insulin and supportive care.  #8 hypertension: Controlled.  Patient takes only lisinopril at home which is on hold due to AKI.  #9 hypomagnesemia: Resolved  #10: Deconditioning: PT OT consulted.  Home health recommended.  DVT prophylaxis: heparin injection 5,000 Units Start: 04/09/20 0715   Code Status: Full Code  Family Communication: None present at bedside.  Plan of care discussed with patient in length with patient and he verbalized understanding and agreed with it.   Status is: Inpatient  Remains inpatient appropriate because:Inpatient level of care appropriate due to severity of illness   Dispo: The patient is from: Home              Anticipated d/c is to: Home with home health              Anticipated d/c date is: 1 to 2 days              Patient currently is not medically stable to  d/c.        Estimated body mass index is 29.35 kg/m as calculated from the following:   Height as of this encounter: _0  (1.702 m).   Weight as of this encounter: 85 kg.      Nutritional status:  Nutrition Problem: Moderate Malnutrition Etiology: chronic illness (recurrent PNA/UTIs)   Signs/Symptoms: mild fat depletion, moderate muscle depletion   Interventions: Refer to RD note for recommendations    Consultants:   ID  Urology  IR  Procedures:  Left PCN  Antimicrobials:  Anti-infectives (From admission, onward)   Start     Dose/Rate Route Frequency Ordered Stop   04/13/20 0922  ciprofloxacin (CIPRO) 400 MG/200ML IVPB  Status:  Discontinued       Note to Pharmacy: Domenick Bookbinder   : cabinet override      04/13/20 0922 04/13/20 1147   04/11/20 1800  piperacillin-tazobactam (ZOSYN) IVPB 3.375 g        3.375 g 12.5 mL/hr over 240 Minutes Intravenous Every 8 hours 04/11/20 1547     04/10/20 2000  ceFEPIme (MAXIPIME) 2 g in sodium chloride 0.9 % 100 mL IVPB  Status:  Discontinued  2 g 200 mL/hr over 30 Minutes Intravenous Every 24 hours 04/09/20 1806 04/11/20 1524   04/09/20 2130  ceFEPIme (MAXIPIME) 2 g in sodium chloride 0.9 % 100 mL IVPB  Status:  Discontinued        2 g 200 mL/hr over 30 Minutes Intravenous Every 24 hours 04/08/20 2207 04/09/20 0957   04/09/20 2000  ampicillin (OMNIPEN) 2 g in sodium chloride 0.9 % 100 mL IVPB  Status:  Discontinued        2 g 300 mL/hr over 20 Minutes Intravenous Every 8 hours 04/09/20 1806 04/11/20 1524   04/09/20 1900  ceFEPIme (MAXIPIME) 2 g in sodium chloride 0.9 % 100 mL IVPB        2 g 200 mL/hr over 30 Minutes Intravenous  Once 04/09/20 1806 04/09/20 2030   04/09/20 0715  cefTRIAXone (ROCEPHIN) 2 g in sodium chloride 0.9 % 100 mL IVPB  Status:  Discontinued        2 g 200 mL/hr over 30 Minutes Intravenous Every 24 hours 04/09/20 0711 04/09/20 1804   04/09/20 0715  azithromycin (ZITHROMAX) 500 mg in  sodium chloride 0.9 % 250 mL IVPB  Status:  Discontinued        500 mg 250 mL/hr over 60 Minutes Intravenous Every 24 hours 04/09/20 0711 04/09/20 1804   04/08/20 2207  vancomycin variable dose per unstable renal function (pharmacist dosing)  Status:  Discontinued         Does not apply See admin instructions 04/08/20 2207 04/09/20 0956   04/08/20 2130  ceFEPIme (MAXIPIME) 2 g in sodium chloride 0.9 % 100 mL IVPB        2 g 200 mL/hr over 30 Minutes Intravenous  Once 04/08/20 2119 04/08/20 2213   04/08/20 2130  metroNIDAZOLE (FLAGYL) IVPB 500 mg        500 mg 100 mL/hr over 60 Minutes Intravenous  Once 04/08/20 2119 04/08/20 2231   04/08/20 2130  vancomycin (VANCOCIN) IVPB 1000 mg/200 mL premix  Status:  Discontinued        1,000 mg 200 mL/hr over 60 Minutes Intravenous  Once 04/08/20 2119 04/08/20 2126   04/08/20 2130  vancomycin (VANCOREADY) IVPB 1750 mg/350 mL        1,750 mg 175 mL/hr over 120 Minutes Intravenous  Once 04/08/20 2126 04/08/20 2351         Subjective: Seen and examined.  Continues to feel well.  No complaints.  Objective: Vitals:   04/14/20 0830 04/14/20 1549 04/14/20 2150 04/15/20 0811  BP: 118/63 (!) 97/54 107/67 110/62  Pulse: (!) 54 62 61 (!) 57  Resp: _0 Temp: (!) 97.5 F (36.4 C) 98 F (36.7 C) 98.4 F (36.9 C) 98.1 F (36.7 C)  TempSrc:  Oral Oral Oral  SpO2: 97% 98% 98% 100%  Weight:      Height:        Intake/Output Summary (Last 24 hours) at 04/15/2020 1217 Last data filed at 04/15/2020 0900 Gross per 24 hour  Intake 240 ml  Output 3425 ml  Net -3185 ml   Filed Weights   04/08/20 2104  Weight: 85 kg    Examination: General exam: Appears calm and comfortable  Respiratory system: Clear to auscultation. Respiratory effort normal. Cardiovascular system: S1 & S2 heard, RRR. No JVD, murmurs, rubs, gallops or clicks. No pedal edema. Gastrointestinal system: Abdomen is nondistended, soft and nontender. No organomegaly or masses  felt. Normal bowel sounds heard.  Urostomy and PCN  bag draining normal color urine. Central nervous system: Alert and oriented. No focal neurological deficits. Extremities: Symmetric 5 x 5 power. Skin: No rashes, lesions or ulcers.  Psychiatry: Judgement and insight appear normal. Mood & affect appropriate.   Data Reviewed: I have personally reviewed following labs and imaging studies  CBC: Recent Labs  Lab 04/11/20 0054 04/12/20 0601 04/13/20 0150 04/14/20 0125 04/15/20 0132  WBC 11.4* 8.8 8.4 19.3* 10.9*  NEUTROABS 10.1* 6.7 5.9 16.7* 7.6  HGB 10.7* 10.1* 10.4* 9.2* 10.8*  HCT 32.3* 30.8* 32.3* 29.8* 34.1*  MCV 89.7 91.4 92.3 94.9 95.8  PLT 259 230 239 208 950   Basic Metabolic Panel: Recent Labs  Lab 04/10/20 0522 04/10/20 0522 04/11/20 0054 04/12/20 0601 04/13/20 0150 04/14/20 1055 04/15/20 0132  NA 137   < > 135 137 137 138 136  K 3.1*   < > 4.1 3.3* 4.4 3.9 4.4  CL 110   < > 107 104 105 105 105  CO2 15*   < > 16* 23 22 21* 20*  GLUCOSE 153*   < > 125* 129* 98 147* 142*  BUN 46*   < > 42* 37* 38* 36* 34*  CREATININE 2.39*   < > 2.36* 2.26* 2.41* 2.58* 2.27*  CALCIUM 7.8*   < > 7.8* 7.7* 7.8* 8.0* 7.9*  MG 1.5*  --  1.9  --   --  2.0  --    < > = values in this interval not displayed.   GFR: Estimated Creatinine Clearance: 26.2 mL/min (A) (by C-G formula based on SCr of 2.27 mg/dL (H)). Liver Function Tests: Recent Labs  Lab 04/08/20 2120 04/09/20 0725  AST 15 16  ALT 17 16  ALKPHOS 70 62  BILITOT 0.5 0.5  PROT 6.7 5.7*  ALBUMIN 2.9* 2.4*   No results for input(s): LIPASE, AMYLASE in the last 168 hours. No results for input(s): AMMONIA in the last 168 hours. Coagulation Profile: Recent Labs  Lab 04/08/20 2153 04/09/20 0725 04/12/20 1651  INR 1.5* 1.5* 1.3*   Cardiac Enzymes: Recent Labs  Lab 04/08/20 2120  CKTOTAL 143   BNP (last 3 results) No results for input(s): PROBNP in the last 8760 hours. HbA1C: No results for input(s): HGBA1C in  the last 72 hours. CBG: Recent Labs  Lab 04/14/20 1223 04/14/20 1709 04/14/20 2148 04/15/20 0736 04/15/20 1149  GLUCAP 132* 150* 132* 126* 148*   Lipid Profile: No results for input(s): CHOL, HDL, LDLCALC, TRIG, CHOLHDL, LDLDIRECT in the last 72 hours. Thyroid Function Tests: No results for input(s): TSH, T4TOTAL, FREET4, T3FREE, THYROIDAB in the last 72 hours. Anemia Panel: No results for input(s): VITAMINB12, FOLATE, FERRITIN, TIBC, IRON, RETICCTPCT in the last 72 hours. Sepsis Labs: Recent Labs  Lab 04/08/20 2120 04/08/20 2250 04/09/20 0411 04/09/20 0725 04/09/20 0943  PROCALCITON  --   --   --  9.21  --   LATICACIDVEN 2.2* 3.0* 1.0  --  1.6    Recent Results (from the past 240 hour(s))  Respiratory Panel by RT PCR (Flu A&B, Covid) - Nasopharyngeal Swab     Status: None   Collection Time: 04/08/20  9:00 PM   Specimen: Nasopharyngeal Swab  Result Value Ref Range Status   SARS Coronavirus 2 by RT PCR NEGATIVE NEGATIVE Final    Comment: (NOTE) SARS-CoV-2 target nucleic acids are NOT DETECTED.  The SARS-CoV-2 RNA is generally detectable in upper respiratoy specimens during the acute phase of infection. The lowest concentration of SARS-CoV-2 viral copies  this assay can detect is 131 copies/mL. A negative result does not preclude SARS-Cov-2 infection and should not be used as the sole basis for treatment or other patient management decisions. A negative result may occur with  improper specimen collection/handling, submission of specimen other than nasopharyngeal swab, presence of viral mutation(s) within the areas targeted by this assay, and inadequate number of viral copies (<131 copies/mL). A negative result must be combined with clinical observations, patient history, and epidemiological information. The expected result is Negative.  Fact Sheet for Patients:  PinkCheek.be  Fact Sheet for Healthcare Providers:   GravelBags.it  This test is no t yet approved or cleared by the [place] FDA and  has been authorized for detection and/or diagnosis of SARS-CoV-2 by FDA under an Emergency Use Authorization (EUA). This EUA will remain  in effect (meaning this test can be used) for the duration of the COVID-19 declaration under Section 564(b)(1) of the Act, 21 U.S.C. section 360bbb-3(b)(1), unless the authorization is terminated or revoked sooner.     Influenza A by PCR NEGATIVE NEGATIVE Final   Influenza B by PCR NEGATIVE NEGATIVE Final    Comment: (NOTE) The Xpert Xpress SARS-CoV-2/FLU/RSV assay is intended as an aid in  the diagnosis of influenza from Nasopharyngeal swab specimens and  should not be used as a sole basis for treatment. Nasal washings and  aspirates are unacceptable for Xpert Xpress SARS-CoV-2/FLU/RSV  testing.  Fact Sheet for Patients: PinkCheek.be  Fact Sheet for Healthcare Providers: GravelBags.it  This test is not yet approved or cleared by the [place] FDA and  has been authorized for detection and/or diagnosis of SARS-CoV-2 by  FDA under an Emergency Use Authorization (EUA). This EUA will remain  in effect (meaning this test can be used) for the duration of the  Covid-19 declaration under Section 564(b)(1) of the Act, 21  U.S.C. section 360bbb-3(b)(1), unless the authorization is  terminated or revoked. Performed at [facility], [place] [address] [place], [place] [address]   Blood Culture (routine x 2)     Status: Abnormal (Preliminary result)   Collection Time: 04/08/20  9:25 PM   Specimen: BLOOD LEFT ARM  Result Value Ref Range Status   Specimen Description BLOOD LEFT ARM  Final   Special Requests   Final    BOTTLES DRAWN AEROBIC AND ANAEROBIC Blood Culture adequate volume   Culture  Setup Time   Final    GRAM POSITIVE COCCI IN CHAINS GRAM NEGATIVE RODS AEROBIC  BOTTLE ONLY CRITICAL RESULT CALLED TO, READ BACK BY AND VERIFIED WITH: PHARMD F WILSON 04/09/20 AT 42 SK GRAM POSITIVE RODS GRAM NEGATIVE RODS ANAEROBIC BOTTLE ONLY CRITICAL RESULT CALLED TO, READ BACK BY AND VERIFIED WITH: Tillman Sers PHARMD 1700 04/10/20 A BROWNING    Culture (A)  Final    ENTEROCOCCUS FAECALIS PSEUDOMONAS AERUGINOSA PREVOTELLA MELANINOGENICA BETA LACTAMASE POSITIVE CULTURE REINCUBATED FOR BETTER GROWTH Performed at [facility], [place] [address], [place], [place] [address]    Report Status PENDING  Incomplete   Organism ID, Bacteria PSEUDOMONAS AERUGINOSA  Final   Organism ID, Bacteria ENTEROCOCCUS FAECALIS  Final      Susceptibility   Enterococcus faecalis - MIC*    AMPICILLIN <=2 SENSITIVE Sensitive     VANCOMYCIN 2 SENSITIVE Sensitive     GENTAMICIN SYNERGY SENSITIVE Sensitive     * ENTEROCOCCUS FAECALIS   Pseudomonas aeruginosa - MIC*    CEFTAZIDIME 4 SENSITIVE Sensitive     CIPROFLOXACIN >=4 RESISTANT Resistant  GENTAMICIN <=1 SENSITIVE Sensitive     IMIPENEM 2 SENSITIVE Sensitive     PIP/TAZO 8 SENSITIVE Sensitive     CEFEPIME 2 SENSITIVE Sensitive     * PSEUDOMONAS AERUGINOSA  Urine culture     Status: Abnormal   Collection Time: 04/08/20  9:25 PM   Specimen: Urine, Clean Catch  Result Value Ref Range Status   Specimen Description URINE, CLEAN CATCH  Final   Special Requests   Final    NONE Performed at [facility], [place] [address], [place], [place] [id number]    Culture MULTIPLE SPECIES PRESENT, SUGGEST RECOLLECTION (A)  Final   Report Status 04/10/2020 FINAL  Final  Blood Culture ID Panel (Reflexed)     Status: Abnormal   Collection Time: 04/08/20  9:25 PM  Result Value Ref Range Status   Enterococcus faecalis DETECTED (A) NOT DETECTED Final    Comment: CRITICAL RESULT CALLED TO, READ BACK BY AND VERIFIED WITH: PHARMD F WILSON 04/09/20 AT 1724 SK    Enterococcus Faecium NOT DETECTED NOT DETECTED Final   Listeria monocytogenes  NOT DETECTED NOT DETECTED Final   Staphylococcus species NOT DETECTED NOT DETECTED Final   Staphylococcus aureus (BCID) NOT DETECTED NOT DETECTED Final   Staphylococcus epidermidis NOT DETECTED NOT DETECTED Final   Staphylococcus lugdunensis NOT DETECTED NOT DETECTED Final   Streptococcus species NOT DETECTED NOT DETECTED Final   Streptococcus agalactiae NOT DETECTED NOT DETECTED Final   Streptococcus pneumoniae NOT DETECTED NOT DETECTED Final   Streptococcus pyogenes NOT DETECTED NOT DETECTED Final   A.calcoaceticus-baumannii NOT DETECTED NOT DETECTED Final   Bacteroides fragilis NOT DETECTED NOT DETECTED Final   Enterobacterales NOT DETECTED NOT DETECTED Final   Enterobacter cloacae complex NOT DETECTED NOT DETECTED Final   Escherichia coli NOT DETECTED NOT DETECTED Final   Klebsiella aerogenes NOT DETECTED NOT DETECTED Final   Klebsiella oxytoca NOT DETECTED NOT DETECTED Final   Klebsiella pneumoniae NOT DETECTED NOT DETECTED Final   Proteus species NOT DETECTED NOT DETECTED Final   Salmonella species NOT DETECTED NOT DETECTED Final   Serratia marcescens NOT DETECTED NOT DETECTED Final   Haemophilus influenzae NOT DETECTED NOT DETECTED Final   Neisseria meningitidis NOT DETECTED NOT DETECTED Final   Pseudomonas aeruginosa DETECTED (A) NOT DETECTED Final    Comment: CRITICAL RESULT CALLED TO, READ BACK BY AND VERIFIED WITH: PHARMD F WILSON 04/09/20 AT 1724 SK    Stenotrophomonas maltophilia NOT DETECTED NOT DETECTED Final   Candida albicans NOT DETECTED NOT DETECTED Final   Candida auris NOT DETECTED NOT DETECTED Final   Candida glabrata NOT DETECTED NOT DETECTED Final   Candida krusei NOT DETECTED NOT DETECTED Final   Candida parapsilosis NOT DETECTED NOT DETECTED Final   Candida tropicalis NOT DETECTED NOT DETECTED Final   Cryptococcus neoformans/gattii NOT DETECTED NOT DETECTED Final   CTX-M ESBL NOT DETECTED NOT DETECTED Final   Carbapenem resistance IMP NOT DETECTED NOT  DETECTED Final   Carbapenem resistance KPC NOT DETECTED NOT DETECTED Final   Carbapenem resistance NDM NOT DETECTED NOT DETECTED Final   Vancomycin resistance NOT DETECTED NOT DETECTED Final   Carbapenem resistance VIM NOT DETECTED NOT DETECTED Final    Comment: Performed at [facility], [address], [place], [place] [address]  Blood Culture (routine x 2)     Status: None   Collection Time: 04/08/20  9:34 PM   Specimen: BLOOD RIGHT HAND  Result Value Ref Range Status   Specimen Description BLOOD RIGHT HAND  Final   Special Requests   Final    BOTTLES DRAWN AEROBIC AND ANAEROBIC Blood Culture results may not be optimal due to an inadequate volume of blood received in culture bottles   Culture   Final    NO GROWTH 5 DAYS Performed at [facility], [place] [address] [place], [place] [address]    Report Status 04/13/2020 FINAL  Final  Culture, blood (routine x 2)     Status: None (Preliminary result)   Collection Time: 04/11/20 11:50 AM   Specimen: BLOOD RIGHT HAND  Result Value Ref Range Status   Specimen Description BLOOD RIGHT HAND  Final   Special Requests   Final    BOTTLES DRAWN AEROBIC AND ANAEROBIC Blood Culture adequate volume   Culture   Final    NO GROWTH 4 DAYS Performed at [facility], [place] [address], [place], [place] [address]    Report Status PENDING  Incomplete  Culture, blood (routine x 2)     Status: None (Preliminary result)   Collection Time: 04/11/20 12:00 PM   Specimen: BLOOD RIGHT ARM  Result Value Ref Range Status   Specimen Description BLOOD RIGHT ARM  Final   Special Requests   Final    BOTTLES DRAWN AEROBIC AND ANAEROBIC Blood Culture adequate volume   Culture   Final    NO GROWTH 4 DAYS Performed at [facility], [place] [address], [place], [place] [address]    Report Status PENDING  Incomplete  Aerobic/Anaerobic Culture (surgical/deep wound)     Status: None (Preliminary result)   Collection Time: 04/13/20 10:00 AM    Specimen: Fluid; Urine  Result Value Ref Range Status   Specimen Description FLUID  Final   Special Requests LEFT KIDNEY  Final   Gram Stain   Final    ABUNDANT WBC PRESENT,BOTH PMN AND MONONUCLEAR ABUNDANT GRAM POSITIVE COCCI    Culture   Final    ABUNDANT PSEUDOMONAS AERUGINOSA SUSCEPTIBILITIES TO FOLLOW Performed at [facility], [place] [address] [place], [place] [address]    Report Status PENDING  Incomplete   Organism ID, Bacteria PSEUDOMONAS AERUGINOSA  Final      Susceptibility   Pseudomonas aeruginosa - MIC*    CEFTAZIDIME 4 SENSITIVE Sensitive     CIPROFLOXACIN 2 INTERMEDIATE Intermediate     GENTAMICIN <=1 SENSITIVE Sensitive     IMIPENEM 2 SENSITIVE Sensitive     PIP/TAZO 8 SENSITIVE Sensitive     CEFEPIME 2 SENSITIVE Sensitive     * ABUNDANT PSEUDOMONAS AERUGINOSA      Radiology Studies: CT ABDOMEN PELVIS WO CONTRAST  Result Date: 04/15/2020 CLINICAL DATA:  Hydronephrosis. EXAM: CT ABDOMEN AND PELVIS WITHOUT CONTRAST TECHNIQUE: Multidetector CT imaging of the abdomen and pelvis was performed following the standard protocol without IV contrast. COMPARISON:  December 19, 2017.  April 11, 2020. FINDINGS: Lower chest: No acute abnormality. Hepatobiliary: No focal liver abnormality is seen. No gallstones, gallbladder wall thickening, or biliary dilatation. Pancreas: Pancreatic calcifications are noted consistent with chronic pancreatitis. Some degree of atrophy is present as well. No definite acute abnormality is noted. Spleen: Normal in size without focal abnormality. Adrenals/Urinary Tract: Adrenal glands appear normal. Status post left percutaneous nephrostomy. Bilateral nephrolithiasis is noted. No hydronephrosis is noted. Status post cystectomy with ileal conduit seen in the right lower quadrant. Stomach/Bowel: Stomach is within normal limits. Appendix appears normal. No evidence of bowel wall thickening, distention, or inflammatory changes. Vascular/Lymphatic: Aortic  atherosclerosis. No enlarged  abdominal or pelvic lymph nodes. Reproductive: Status post prostatectomy. Other: No abdominal wall hernia or abnormality. No abdominopelvic ascites. Musculoskeletal: No acute or significant osseous findings. IMPRESSION: 1. Bilateral nephrolithiasis. No hydronephrosis is noted. Status post left percutaneous nephrostomy. 2. Status post cystectomy with ileal conduit seen in the right lower quadrant. 3. Pancreatic calcifications are noted consistent with chronic pancreatitis. 4. Aortic atherosclerosis. Aortic Atherosclerosis (ICD10-I70.0). Electronically Signed   By: Marijo Conception M.D.   On: 04/15/2020 10:21    Scheduled Meds: . feeding supplement (GLUCERNA SHAKE)  237 mL Oral TID BM  . ferrous sulfate  325 mg Oral Q breakfast  . heparin  5,000 Units Subcutaneous Q8H  . insulin aspart  0-5 Units Subcutaneous QHS  . insulin aspart  0-9 Units Subcutaneous TID WC  . insulin glargine  5 Units Subcutaneous QHS  . loratadine  10 mg Oral Daily  . multivitamin with minerals  1 tablet Oral Daily  . omega-3 acid ethyl esters  1 g Oral BID  . pravastatin  20 mg Oral Daily  . sodium chloride flush  5 mL Intracatheter Q8H   Continuous Infusions: . sodium chloride 75 mL/hr at 04/14/20 1548  . piperacillin-tazobactam (ZOSYN)  IV 3.375 g (04/15/20 0821)     LOS: 6 days   Time spent: 28 minutes   Darliss Cheney, MD [facility]  04/15/2020, 12:17 PM   To contact the attending provider between 7A-7P or the covering provider during after hours 7P-7A, please log into the web site [email or web address].

## 2020-04-15 NOTE — Progress Notes (Signed)
Physical Therapy Treatment Patient Details Name: Shane [name] MRN: [id number] DOB: 22-Apr-1936 Today's Date: 04/15/2020    History of Present Illness 84yo male who recently fell at home; recent history of Covid January 2021 and has been weak ever since but it has been getting worse. CTH negative, but imaging did find L renal mass pending w/u. Admitted with severe sepsis secondary to UTI. PMH moderate aortic stenosis, HTN, HLD, hx Bells palsy, DM, CVA,  bladder cancer, cardiac cath, urostomy    PT Comments    Pt is improving well toward goals.  Emphasis on standing exercise and gait stability/stamina over a longer distance.   Follow Up Recommendations  Home health PT;Supervision for mobility/OOB     Equipment Recommendations  Other (comment)    Recommendations for Other Services       Precautions / Restrictions Precautions Precautions: Fall (minimal risk) Precaution Comments: urostomy R abdomen    Mobility  Bed Mobility               General bed mobility comments: pt sitting in recliner upon arrival  Transfers Overall transfer level: Needs assistance   Transfers: Sit to/from Stand Sit to Stand: Supervision            Ambulation/Gait Ambulation/Gait assistance: Min guard Gait Distance (Feet): 1000 Feet Assistive device: IV Pole;None Gait Pattern/deviations: Step-through pattern Gait velocity: decreased Gait velocity interpretation: 1.31 - 2.62 ft/sec, indicative of limited community ambulator General Gait Details: slower to moderate cadence, generally steady, slumping posture with fatiguing.  SpO2 on RA with gait at 91% and HR 90's.  With rest sats rose to 95%   Stairs             Wheelchair Mobility    Modified Rankin (Stroke Patients Only)       Balance Overall balance assessment: Needs assistance;History of Falls   Sitting balance-Leahy Scale: Good     Standing balance support: No upper extremity supported Standing balance-Leahy  Scale: Fair                              Cognition Arousal/Alertness: Awake/alert Behavior During Therapy: WFL for tasks assessed/performed Overall Cognitive Status: Within Functional Limits for tasks assessed                                        Exercises General Exercises - Lower Extremity Hip ABduction/ADduction: AROM;Strengthening;Both;10 reps;Standing Hip Flexion/Marching: AROM;Strengthening;Both;10 reps;Standing Toe Raises: AROM;10 reps;Standing Heel Raises: AROM;10 reps;Seated Mini-Sqauts: AROM;10 reps;Standing    General Comments        Pertinent Vitals/Pain Pain Assessment: Faces Faces Pain Scale: No hurt Pain Intervention(s): Monitored during session    Home Living                      Prior Function            PT Goals (current goals can now be found in the care plan section) Acute Rehab PT Goals Patient Stated Goal: to return to prior level of functioning PT Goal Formulation: With patient/family Time For Goal Achievement: 04/26/20 Potential to Achieve Goals: Good Progress towards PT goals: Progressing toward goals    Frequency    Min 3X/week      PT Plan Current plan remains appropriate    Co-evaluation  AM-PAC PT "6 Clicks" Mobility   Outcome Measure  Help needed turning from your back to your side while in a flat bed without using bedrails?: None Help needed moving from lying on your back to sitting on the side of a flat bed without using bedrails?: A Little Help needed moving to and from a bed to a chair (including a wheelchair)?: A Little Help needed standing up from a chair using your arms (e.g., wheelchair or bedside chair)?: A Little Help needed to walk in hospital room?: A Little Help needed climbing 3-5 steps with a railing? : A Lot 6 Click Score: 18    End of Session   Activity Tolerance: Patient tolerated treatment well Patient left: in chair;with call bell/phone within  reach;with family/visitor present Nurse Communication: Mobility status PT Visit Diagnosis: Unsteadiness on feet (R26.81);Difficulty in walking, not elsewhere classified (R26.2);History of falling (Z91.81)     Time: 1478-2956 PT Time Calculation (min) (ACUTE ONLY): 20 min  Charges:  $Gait Training: 8-22 mins                     04/15/2020  Ginger Carne., PT Acute Rehabilitation Services [phone]  (pager) [phone]  (office)   Tessie Fass Rossi Silvestro 04/15/2020, 5:23 PM

## 2020-04-15 NOTE — Progress Notes (Signed)
Chart reviewed: Patient had LEFT PCN placement on 10/1 but creatinine has not significantly improved.  Will repeat non-contrast CT abd/pelvis to reevaluate for hydro/obstruction.

## 2020-04-15 NOTE — Consult Note (Signed)
[place] Nurse ostomy consult note Stoma type/location: RUQ urostomy Stomal assessment/size: 1 inch Peristomal assessment: Not seen today Treatment options for stomal/peristomal skin: skin barrier ring and convex pouch Output: yellow Ostomy pouching: 1pc.convex urostomy pouch with skin barrier ring.  Adapter is needed to attach to bedside urinary drainage bag.  Education provided: None. Patient's wife is independent in ostomy pouch change procedure. Enrolled patient in [place] program: Yes, during previous admission. Patient is established with a supply provider.  Pouches requested to bedside: 4 pouches, Lawson # [phone], 4 skin barrier rings, Kellie Simmering # [phone], 1 adapter Kellie Simmering # [phone]   [place] nursing team will not follow, but will remain available to this patient, the nursing and medical teams.  Please re-consult if needed. Thanks, Maudie Flakes, MSN, RN, [place], Arther Abbott  Pager# [phone]

## 2020-04-15 NOTE — Progress Notes (Addendum)
[facility] for Infectious Disease    Date of Admission:  04/08/2020   Total days of antibiotics 8/day 5 of piptazo          ID: Shane [name] is a 84 y.o. male with polymicrobial bacteremia due to renal stones with pyelonephrosis/ hydronephrosis and secondary bacteremia with pseudomonas and enterococcus and prevotella species Principal Problem:   Severe sepsis ([place]) Active Problems:   Hypertension   Diabetes mellitus without complication ([place])   UTI (urinary tract infection)   Malignant neoplasm of urinary bladder ([place])   S/P ileal conduit (HCC)   AKI (acute kidney injury) ([place])   Lobar pneumonia (HCC)    Subjective: Underwent left perc neph placement on 10/1, urinary sample grew PsA(R FQ), had post procedure fever and leukocytosis but now trending down. Labs today show CBC of 10.9K, remains afebrile. Cr at 2.2 today  Medications:  . feeding supplement (GLUCERNA SHAKE)  237 mL Oral TID BM  . ferrous sulfate  325 mg Oral Q breakfast  . heparin  5,000 Units Subcutaneous Q8H  . insulin aspart  0-5 Units Subcutaneous QHS  . insulin aspart  0-9 Units Subcutaneous TID WC  . insulin glargine  5 Units Subcutaneous QHS  . loratadine  10 mg Oral Daily  . multivitamin with minerals  1 tablet Oral Daily  . omega-3 acid ethyl esters  1 g Oral BID  . pravastatin  20 mg Oral Daily  . sodium chloride flush  5 mL Intracatheter Q8H    Objective: Vital signs in last 24 hours: Temp:  [98 F (36.7 C)-98.4 F (36.9 C)] 98.1 F (36.7 C) (10/03 0811) Pulse Rate:  [57-62] 57 (10/03 0811) Resp:  [16-20] 16 (10/03 0811) BP: (97-110)/(54-67) 110/62 (10/03 0811) SpO2:  [98 %-100 %] 100 % (10/03 4132) Physical Exam  Constitutional: He is oriented to person, place, and time. He appears well-developed and well-nourished. No distress.  HENT:  Mouth/Throat: Oropharynx is clear and moist. No oropharyngeal exudate.  Cardiovascular: Normal rate, regular rhythm and normal heart sounds. Exam  reveals no gallop and no friction rub.  No murmur heard.  Pulmonary/Chest: Effort normal and breath sounds normal. No respiratory distress. He has no wheezes.  Abdominal: Soft. Bowel sounds are normal. He exhibits no distension. There is no tenderness. RUQ urostomy - yellow urine Back:left perc neph in place with clear yellow urine Lymphadenopathy:  He has no cervical adenopathy.  Neurological: He is alert and oriented to person, place, and time.  Skin: Skin is warm and dry. No rash noted. No erythema.  Psychiatric: He has a normal mood and affect. His behavior is normal.      Lab Results Recent Labs    04/13/20 0150 04/14/20 0125 04/14/20 1055 04/15/20 0132  WBC  --  19.3*  --  10.9*  HGB  --  9.2*  --  10.8*  HCT  --  29.8*  --  34.1*  NA   < >  --  138 136  K   < >  --  3.9 4.4  CL   < >  --  105 105  CO2   < >  --  21* 20*  BUN   < >  --  36* 34*  CREATININE   < >  --  2.58* 2.27*   < > = values in this interval not displayed.    Microbiology: Reviewed, 10/1 urine cultures showing PsA and blood cx from 9/27 showing PsA, enterococcus, and prevotella  species Studies/Results: CT ABDOMEN PELVIS WO CONTRAST  Result Date: 04/15/2020 CLINICAL DATA:  Hydronephrosis. EXAM: CT ABDOMEN AND PELVIS WITHOUT CONTRAST TECHNIQUE: Multidetector CT imaging of the abdomen and pelvis was performed following the standard protocol without IV contrast. COMPARISON:  December 19, 2017.  April 11, 2020. FINDINGS: Lower chest: No acute abnormality. Hepatobiliary: No focal liver abnormality is seen. No gallstones, gallbladder wall thickening, or biliary dilatation. Pancreas: Pancreatic calcifications are noted consistent with chronic pancreatitis. Some degree of atrophy is present as well. No definite acute abnormality is noted. Spleen: Normal in size without focal abnormality. Adrenals/Urinary Tract: Adrenal glands appear normal. Status post left percutaneous nephrostomy. Bilateral nephrolithiasis is  noted. No hydronephrosis is noted. Status post cystectomy with ileal conduit seen in the right lower quadrant. Stomach/Bowel: Stomach is within normal limits. Appendix appears normal. No evidence of bowel wall thickening, distention, or inflammatory changes. Vascular/Lymphatic: Aortic atherosclerosis. No enlarged abdominal or pelvic lymph nodes. Reproductive: Status post prostatectomy. Other: No abdominal wall hernia or abnormality. No abdominopelvic ascites. Musculoskeletal: No acute or significant osseous findings. IMPRESSION: 1. Bilateral nephrolithiasis. No hydronephrosis is noted. Status post left percutaneous nephrostomy. 2. Status post cystectomy with ileal conduit seen in the right lower quadrant. 3. Pancreatic calcifications are noted consistent with chronic pancreatitis. 4. Aortic atherosclerosis. Aortic Atherosclerosis (ICD10-I70.0). Electronically Signed   By: Marijo Conception M.D.   On: 04/15/2020 10:21     Assessment/Plan: Pyelonephritis with associated hydronephrosis s/p PCN and secondary polymicrobial bacteremia = continue on pip/tazo to treat pyelo and secondary bacteremia. Now has source control as PCN was placed. Still evidence of PsA on culture from 10/1 sample.  Leukocytosis = likely due to post procedural instrumentation from perc neph now improving, but still slightly elevated  [facility] for Infectious Diseases Cell: [phone] Pager: [phone]  04/15/2020, 12:34 PM

## 2020-04-16 LAB — BASIC METABOLIC PANEL
Anion gap: 10 (ref 5–15)
BUN: 25 mg/dL — ABNORMAL HIGH (ref 8–23)
CO2: 24 mmol/L (ref 22–32)
Calcium: 8.2 mg/dL — ABNORMAL LOW (ref 8.9–10.3)
Chloride: 107 mmol/L (ref 98–111)
Creatinine, Ser: 2.17 mg/dL — ABNORMAL HIGH (ref 0.61–1.24)
GFR calc Af Amer: 32 mL/min — ABNORMAL LOW (ref >60)
GFR calc non Af Amer: 27 mL/min — ABNORMAL LOW (ref >60)
Glucose, Bld: 132 mg/dL — ABNORMAL HIGH (ref 70–99)
Potassium: 4.1 mmol/L (ref 3.5–5.1)
Sodium: 141 mmol/L (ref 135–145)

## 2020-04-16 LAB — CBC WITH DIFFERENTIAL/PLATELET
Abs Immature Granulocytes: 0.05 10*3/uL (ref 0.00–0.07)
Basophils Absolute: 0.1 10*3/uL (ref 0.0–0.1)
Basophils Relative: 1 %
Eosinophils Absolute: 0.2 10*3/uL (ref 0.0–0.5)
Eosinophils Relative: 3 %
HCT: 33.6 % — ABNORMAL LOW (ref 39.0–52.0)
Hemoglobin: 10.6 g/dL — ABNORMAL LOW (ref 13.0–17.0)
Immature Granulocytes: 1 %
Lymphocytes Relative: 34 %
Lymphs Abs: 2.7 10*3/uL (ref 0.7–4.0)
MCH: 30.2 pg (ref 26.0–34.0)
MCHC: 31.5 g/dL (ref 30.0–36.0)
MCV: 95.7 fL (ref 80.0–100.0)
Monocytes Absolute: 0.5 10*3/uL (ref 0.1–1.0)
Monocytes Relative: 7 %
Neutro Abs: 4.4 10*3/uL (ref 1.7–7.7)
Neutrophils Relative %: 54 %
Platelets: 292 10*3/uL (ref 150–400)
RBC: 3.51 MIL/uL — ABNORMAL LOW (ref 4.22–5.81)
RDW: 14.6 % (ref 11.5–15.5)
WBC: 8 10*3/uL (ref 4.0–10.5)
nRBC: 0 % (ref 0.0–0.2)

## 2020-04-16 LAB — CULTURE, BLOOD (ROUTINE X 2)
Culture: NO GROWTH
Culture: NO GROWTH
Special Requests: ADEQUATE
Special Requests: ADEQUATE

## 2020-04-16 LAB — GLUCOSE, CAPILLARY
Glucose-Capillary: 126 mg/dL — ABNORMAL HIGH (ref 70–99)
Glucose-Capillary: 133 mg/dL — ABNORMAL HIGH (ref 70–99)
Glucose-Capillary: 142 mg/dL — ABNORMAL HIGH (ref 70–99)
Glucose-Capillary: 123 mg/dL — ABNORMAL HIGH (ref 70–99)

## 2020-04-16 NOTE — Progress Notes (Signed)
[facility] for Infectious Disease   Reason for visit: Follow up on bacteremia  Interval History: had PCN placed 10/1 and cultures with Pseudomonas and Enterococcus as well.  No complaints.  Wife at his bedside.  WBC wnl.  Remains afebrile.     Physical Exam: Constitutional:  Vitals:   04/15/20 1940 04/16/20 0723  BP: 120/86 114/68  Pulse: 64 (!) 54  Resp: 18 16  Temp: 98 F (36.7 C) 98.1 F (36.7 C)  SpO2: 100% 97%   patient appears in NAD Respiratory: Normal respiratory effort; CTA B Cardiovascular: RRR GI: soft, nt, nd  Review of Systems: Constitutional: negative for fevers and chills Gastrointestinal: negative for diarrhea  Lab Results  Component Value Date   WBC 8.0 04/16/2020   HGB 10.6 (L) 04/16/2020   HCT 33.6 (L) 04/16/2020   MCV 95.7 04/16/2020   PLT 292 04/16/2020    Lab Results  Component Value Date   CREATININE 2.17 (H) 04/16/2020   BUN 25 (H) 04/16/2020   NA 141 04/16/2020   K 4.1 04/16/2020   CL 107 04/16/2020   CO2 24 04/16/2020    Lab Results  Component Value Date   ALT 16 04/09/2020   AST 16 04/09/2020   ALKPHOS 62 04/09/2020     Microbiology: Recent Results (from the past 240 hour(s))  Respiratory Panel by RT PCR (Flu A&B, Covid) - Nasopharyngeal Swab     Status: None   Collection Time: 04/08/20  9:00 PM   Specimen: Nasopharyngeal Swab  Result Value Ref Range Status   SARS Coronavirus 2 by RT PCR NEGATIVE NEGATIVE Final    Comment: (NOTE) SARS-CoV-2 target nucleic acids are NOT DETECTED.  The SARS-CoV-2 RNA is generally detectable in upper respiratoy specimens during the acute phase of infection. The lowest concentration of SARS-CoV-2 viral copies this assay can detect is 131 copies/mL. A negative result does not preclude SARS-Cov-2 infection and should not be used as the sole basis for treatment or other patient management decisions. A negative result may occur with  improper specimen collection/handling, submission of  specimen other than nasopharyngeal swab, presence of viral mutation(s) within the areas targeted by this assay, and inadequate number of viral copies (<131 copies/mL). A negative result must be combined with clinical observations, patient history, and epidemiological information. The expected result is Negative.  Fact Sheet for Patients:  PinkCheek.be  Fact Sheet for Healthcare Providers:  GravelBags.it  This test is no t yet approved or cleared by the [place] FDA and  has been authorized for detection and/or diagnosis of SARS-CoV-2 by FDA under an Emergency Use Authorization (EUA). This EUA will remain  in effect (meaning this test can be used) for the duration of the COVID-19 declaration under Section 564(b)(1) of the Act, 21 U.S.C. section 360bbb-3(b)(1), unless the authorization is terminated or revoked sooner.     Influenza A by PCR NEGATIVE NEGATIVE Final   Influenza B by PCR NEGATIVE NEGATIVE Final    Comment: (NOTE) The Xpert Xpress SARS-CoV-2/FLU/RSV assay is intended as an aid in  the diagnosis of influenza from Nasopharyngeal swab specimens and  should not be used as a sole basis for treatment. Nasal washings and  aspirates are unacceptable for Xpert Xpress SARS-CoV-2/FLU/RSV  testing.  Fact Sheet for Patients: PinkCheek.be  Fact Sheet for Healthcare Providers: GravelBags.it  This test is not yet approved or cleared by the [place] FDA and  has been authorized for detection and/or diagnosis of SARS-CoV-2 by  FDA under  an Emergency Use Authorization (EUA). This EUA will remain  in effect (meaning this test can be used) for the duration of the  Covid-19 declaration under Section 564(b)(1) of the Act, 21  U.S.C. section 360bbb-3(b)(1), unless the authorization is  terminated or revoked. Performed at [facility], [place] [address],  [place], [place] [address]   Blood Culture (routine x 2)     Status: Abnormal   Collection Time: 04/08/20  9:25 PM   Specimen: BLOOD LEFT ARM  Result Value Ref Range Status   Specimen Description BLOOD LEFT ARM  Final   Special Requests   Final    BOTTLES DRAWN AEROBIC AND ANAEROBIC Blood Culture adequate volume   Culture  Setup Time   Final    GRAM POSITIVE COCCI IN CHAINS GRAM NEGATIVE RODS AEROBIC BOTTLE ONLY CRITICAL RESULT CALLED TO, READ BACK BY AND VERIFIED WITH: PHARMD F WILSON 04/09/20 AT 2 SK GRAM POSITIVE RODS GRAM NEGATIVE RODS ANAEROBIC BOTTLE ONLY CRITICAL RESULT CALLED TO, READ BACK BY AND VERIFIED WITHTillman Sers PHARMD 1700 04/10/20 A BROWNING    Culture (A)  Final    ENTEROCOCCUS FAECALIS PSEUDOMONAS AERUGINOSA PREVOTELLA MELANINOGENICA BETA LACTAMASE POSITIVE ACTINOTIGNUM SCHAALII Performed at [facility], [place] [address], [place], [place] [address]    Report Status 04/15/2020 FINAL  Final   Organism ID, Bacteria PSEUDOMONAS AERUGINOSA  Final   Organism ID, Bacteria ENTEROCOCCUS FAECALIS  Final      Susceptibility   Enterococcus faecalis - MIC*    AMPICILLIN <=2 SENSITIVE Sensitive     VANCOMYCIN 2 SENSITIVE Sensitive     GENTAMICIN SYNERGY SENSITIVE Sensitive     * ENTEROCOCCUS FAECALIS   Pseudomonas aeruginosa - MIC*    CEFTAZIDIME 4 SENSITIVE Sensitive     CIPROFLOXACIN >=4 RESISTANT Resistant     GENTAMICIN <=1 SENSITIVE Sensitive     IMIPENEM 2 SENSITIVE Sensitive     PIP/TAZO 8 SENSITIVE Sensitive     CEFEPIME 2 SENSITIVE Sensitive     * PSEUDOMONAS AERUGINOSA  Urine culture     Status: Abnormal   Collection Time: 04/08/20  9:25 PM   Specimen: Urine, Clean Catch  Result Value Ref Range Status   Specimen Description URINE, CLEAN CATCH  Final   Special Requests   Final    NONE Performed at [facility], [place] [address] [place], [place] [address]    Culture MULTIPLE SPECIES PRESENT, SUGGEST RECOLLECTION (A)  Final   Report Status  04/10/2020 FINAL  Final  Blood Culture ID Panel (Reflexed)     Status: Abnormal   Collection Time: 04/08/20  9:25 PM  Result Value Ref Range Status   Enterococcus faecalis DETECTED (A) NOT DETECTED Final    Comment: CRITICAL RESULT CALLED TO, READ BACK BY AND VERIFIED WITH: PHARMD F WILSON 04/09/20 AT 1724 SK    Enterococcus Faecium NOT DETECTED NOT DETECTED Final   Listeria monocytogenes NOT DETECTED NOT DETECTED Final   Staphylococcus species NOT DETECTED NOT DETECTED Final   Staphylococcus aureus (BCID) NOT DETECTED NOT DETECTED Final   Staphylococcus epidermidis NOT DETECTED NOT DETECTED Final   Staphylococcus lugdunensis NOT DETECTED NOT DETECTED Final   Streptococcus species NOT DETECTED NOT DETECTED Final   Streptococcus agalactiae NOT DETECTED NOT DETECTED Final   Streptococcus pneumoniae NOT DETECTED NOT DETECTED Final   Streptococcus pyogenes NOT DETECTED NOT DETECTED Final   A.calcoaceticus-baumannii NOT DETECTED NOT DETECTED Final   Bacteroides fragilis NOT DETECTED NOT DETECTED Final   Enterobacterales  NOT DETECTED NOT DETECTED Final   Enterobacter cloacae complex NOT DETECTED NOT DETECTED Final   Escherichia coli NOT DETECTED NOT DETECTED Final   Klebsiella aerogenes NOT DETECTED NOT DETECTED Final   Klebsiella oxytoca NOT DETECTED NOT DETECTED Final   Klebsiella pneumoniae NOT DETECTED NOT DETECTED Final   Proteus species NOT DETECTED NOT DETECTED Final   Salmonella species NOT DETECTED NOT DETECTED Final   Serratia marcescens NOT DETECTED NOT DETECTED Final   Haemophilus influenzae NOT DETECTED NOT DETECTED Final   Neisseria meningitidis NOT DETECTED NOT DETECTED Final   Pseudomonas aeruginosa DETECTED (A) NOT DETECTED Final    Comment: CRITICAL RESULT CALLED TO, READ BACK BY AND VERIFIED WITH: PHARMD F WILSON 04/09/20 AT 1724 SK    Stenotrophomonas maltophilia NOT DETECTED NOT DETECTED Final   Candida albicans NOT DETECTED NOT DETECTED Final   Candida auris NOT  DETECTED NOT DETECTED Final   Candida glabrata NOT DETECTED NOT DETECTED Final   Candida krusei NOT DETECTED NOT DETECTED Final   Candida parapsilosis NOT DETECTED NOT DETECTED Final   Candida tropicalis NOT DETECTED NOT DETECTED Final   Cryptococcus neoformans/gattii NOT DETECTED NOT DETECTED Final   CTX-M ESBL NOT DETECTED NOT DETECTED Final   Carbapenem resistance IMP NOT DETECTED NOT DETECTED Final   Carbapenem resistance KPC NOT DETECTED NOT DETECTED Final   Carbapenem resistance NDM NOT DETECTED NOT DETECTED Final   Vancomycin resistance NOT DETECTED NOT DETECTED Final   Carbapenem resistance VIM NOT DETECTED NOT DETECTED Final    Comment: Performed at [facility], [address] [place], [place] [address]  Blood Culture (routine x 2)     Status: None   Collection Time: 04/08/20  9:34 PM   Specimen: BLOOD RIGHT HAND  Result Value Ref Range Status   Specimen Description BLOOD RIGHT HAND  Final   Special Requests   Final    BOTTLES DRAWN AEROBIC AND ANAEROBIC Blood Culture results may not be optimal due to an inadequate volume of blood received in culture bottles   Culture   Final    NO GROWTH 5 DAYS Performed at [facility], [place] [address] [place], [place] [address]    Report Status 04/13/2020 FINAL  Final  Culture, blood (routine x 2)     Status: None (Preliminary result)   Collection Time: 04/11/20 11:50 AM   Specimen: BLOOD RIGHT HAND  Result Value Ref Range Status   Specimen Description BLOOD RIGHT HAND  Final   Special Requests   Final    BOTTLES DRAWN AEROBIC AND ANAEROBIC Blood Culture adequate volume   Culture   Final    NO GROWTH 4 DAYS Performed at [facility], [place] [address], [place], [place] [address]    Report Status PENDING  Incomplete  Culture, blood (routine x 2)     Status: None (Preliminary result)   Collection Time: 04/11/20 12:00 PM   Specimen: BLOOD RIGHT ARM  Result Value Ref Range Status   Specimen Description BLOOD RIGHT ARM   Final   Special Requests   Final    BOTTLES DRAWN AEROBIC AND ANAEROBIC Blood Culture adequate volume   Culture   Final    NO GROWTH 4 DAYS Performed at [facility], [place] [address] [place], [place] [address]    Report Status PENDING  Incomplete  Aerobic/Anaerobic Culture (surgical/deep wound)     Status: None (Preliminary result)   Collection Time: 04/13/20 10:00 AM   Specimen: Fluid; Urine  Result Value Ref Range Status   Specimen Description FLUID  Final   Special Requests LEFT KIDNEY  Final   Gram Stain   Final    ABUNDANT WBC PRESENT,BOTH PMN AND MONONUCLEAR ABUNDANT GRAM POSITIVE COCCI Performed at [facility], [place] [address] [place], [place] [address]    Culture   Final    ABUNDANT PSEUDOMONAS AERUGINOSA ABUNDANT ENTEROCOCCUS FAECALIS    Report Status PENDING  Incomplete   Organism ID, Bacteria PSEUDOMONAS AERUGINOSA  Final   Organism ID, Bacteria ENTEROCOCCUS FAECALIS  Final      Susceptibility   Enterococcus faecalis - MIC*    AMPICILLIN <=2 SENSITIVE Sensitive     VANCOMYCIN 2 SENSITIVE Sensitive     GENTAMICIN SYNERGY SENSITIVE Sensitive     * ABUNDANT ENTEROCOCCUS FAECALIS   Pseudomonas aeruginosa - MIC*    CEFTAZIDIME 4 SENSITIVE Sensitive     CIPROFLOXACIN 2 INTERMEDIATE Intermediate     GENTAMICIN <=1 SENSITIVE Sensitive     IMIPENEM 2 SENSITIVE Sensitive     PIP/TAZO 8 SENSITIVE Sensitive     CEFEPIME 2 SENSITIVE Sensitive     * ABUNDANT PSEUDOMONAS AERUGINOSA    Impression/Plan:  1. Bacteremia - urinary source with same bacterial growth.  On piperacillin/tazobactam. Repeat blood cultures remained negative.   2.  Left sided renal pelivic stone with obstruction, s/p PCN tube placement - growth of culture as above with the same bacteria c/w urinary source with urinary stones.   He is on piperacillin/tazobactam and will continue.  With the PCN tube I favor continuing antibiotics at discharge.  Unfortunately is resistant to oral options so  will continue with piperacillin/tazobactam 7 more days through 10/11 (10 days post PCN).    3.  Access - has acute on chronic renal insufficiency.   Will order for a tunneled catheter rather than a picc line.   Plan discussed with the patient and his wife.  He will follow up with Dr. Tresa Moore  Diagnosis: bacteremia  Culture Result: Pseudomonas and Enterococcus  Allergies  Allergen Reactions  . Other Swelling and Other (See Comments)    Pt states had swelling of the eye when having eye surgery and the anesthetic placed in eye- recovered the same day    OPAT Orders Discharge antibiotics to be given via PICC line Discharge antibiotics: piperacillin/tazobactam Per pharmacy protocol yes Duration: 7 more days End Date: 10/11  [facility] Care Per Protocol:  Home health RN for IV administration and teaching; PICC line care and labs.    Labs weekly while on IV antibiotics: _x_ CBC with differential _x_ BMP __ CMP __ CRP __ ESR __ Vancomycin trough __ CK  _x_ Please pull PIC at completion of IV antibiotics - will need IR removal of CVC __ Please leave PIC in place until doctor has seen patient or been notified  Fax weekly labs to [phone]  Clinic Follow Up Appt: None - following with urology.

## 2020-04-16 NOTE — Progress Notes (Signed)
Subjective: Afebrile. Pain controlled. No nausea or emesis. Tolerating L PCN, draining clear yellow urine. Urostomy draining clear.  Objective: Vital signs in last 24 hours: Temp:  [98 F (36.7 C)-98.1 F (36.7 C)] 98.1 F (36.7 C) (10/04 0723) Pulse Rate:  [54-64] 54 (10/04 0723) Resp:  [16-18] 16 (10/04 0723) BP: (110-131)/(62-86) 114/68 (10/04 0723) SpO2:  [97 %-100 %] 97 % (10/04 0723)  Intake/Output from previous day: 10/03 0701 - 10/04 0700 In: 250  Out: 2770 [Urine:2770] Intake/Output this shift: No intake/output data recorded.  Physical Exam:  General: Alert and oriented CV: RRR Lungs: Clear Abdomen: Soft, ND, IC p/p draining clear yellow urine Ext: NT, No erythema  Lab Results: Recent Labs    04/14/20 0125 04/15/20 0132 04/16/20 0331  HGB 9.2* 10.8* 10.6*  HCT 29.8* 34.1* 33.6*   BMET Recent Labs    04/15/20 0132 04/16/20 0331  NA 136 141  K 4.4 4.1  CL 105 107  CO2 20* 24  GLUCOSE 142* 132*  BUN 34* 25*  CREATININE 2.27* 2.17*  CALCIUM 7.9* 8.2*     Studies/Results: CT ABDOMEN PELVIS WO CONTRAST  Result Date: 04/15/2020 CLINICAL DATA:  Hydronephrosis. EXAM: CT ABDOMEN AND PELVIS WITHOUT CONTRAST TECHNIQUE: Multidetector CT imaging of the abdomen and pelvis was performed following the standard protocol without IV contrast. COMPARISON:  December 19, 2017.  April 11, 2020. FINDINGS: Lower chest: No acute abnormality. Hepatobiliary: No focal liver abnormality is seen. No gallstones, gallbladder wall thickening, or biliary dilatation. Pancreas: Pancreatic calcifications are noted consistent with chronic pancreatitis. Some degree of atrophy is present as well. No definite acute abnormality is noted. Spleen: Normal in size without focal abnormality. Adrenals/Urinary Tract: Adrenal glands appear normal. Status post left percutaneous nephrostomy. Bilateral nephrolithiasis is noted. No hydronephrosis is noted. Status post cystectomy with ileal conduit seen in  the right lower quadrant. Stomach/Bowel: Stomach is within normal limits. Appendix appears normal. No evidence of bowel wall thickening, distention, or inflammatory changes. Vascular/Lymphatic: Aortic atherosclerosis. No enlarged abdominal or pelvic lymph nodes. Reproductive: Status post prostatectomy. Other: No abdominal wall hernia or abnormality. No abdominopelvic ascites. Musculoskeletal: No acute or significant osseous findings. IMPRESSION: 1. Bilateral nephrolithiasis. No hydronephrosis is noted. Status post left percutaneous nephrostomy. 2. Status post cystectomy with ileal conduit seen in the right lower quadrant. 3. Pancreatic calcifications are noted consistent with chronic pancreatitis. 4. Aortic atherosclerosis. Aortic Atherosclerosis (ICD10-I70.0). Electronically Signed   By: Marijo Conception M.D.   On: 04/15/2020 10:21    Assessment/Plan: 1. Left renal pelvis stone measuring 10x70mm causing obstruction: MR abd 9/29 with10 x 6 mm left renal pelvis stone with moderate hydronephrosis. S/p L PCN on 10/1. 2. Left renal pelvis filling defect possibly tumor versus infectious debris.Seen on MR abd9/29. 3. Right lower pole calculus. Possibly seen on MR abd9/29. No evidence for hydronephrosis on the right side. 4. Sepsis due to urinary source: Presented to ED with recurrent falls, fever, weakness, AMS. T-max 100.9 in ED with systolic blood pressures in the 80s. UA with many bacteria, white blood cells, large leukocyte esterase. Urine culture 04/08/2020 with multiple species present.Blood culture growing Enterococcus faecalis and Pseudomonas. In setting of obstruction from left-sided renal pelvis stone as above. 5. AKI on CKD 3a: Cr2.26 on 04/12/2020 at time of consultation.  6. History ofpT2N0(0/16)M0R0urothelial carcinoma the bladder s/p RC/IC 10/16/2016  -L PCN placed 10/1 -CT A/P 10/3 with resolved hydronephrosis -Cr 2.17 slight downtrend from 2.27 -Continue abx per ID -Messaged  schedulers to arrange for f/u with  Dr. Tresa Moore to treat L pelvic stone/evaluate for possible L UTUC -Will sign off. Please call with questions.   LOS: 7 days   Janith Lima 04/16/2020, 7:49 AM [facility]  Pager: [phone]

## 2020-04-16 NOTE — Progress Notes (Signed)
Occupational Therapy Treatment Patient Details Name: Shane [name] MRN: [id number] DOB: 1936/03/05 Today's Date: 04/16/2020    History of present illness 84yo male who recently fell at home; recent history of Covid January 2021 and has been weak ever since but it has been getting worse. CTH negative, but imaging did find L renal mass pending w/u. Admitted with severe sepsis secondary to UTI. PMH moderate aortic stenosis, HTN, HLD, hx Bells palsy, DM, CVA,  bladder cancer, cardiac cath, urostomy   OT comments  Pt progressing towards established OT goals. Pt performing ADLs and functional mobility at [facility] A level. Wife reporting that today patient is more awake and engaged. Providing pt with handout and education on energy conservation techniques for ADLs; both pt and wife verbalized understanding. Continue to recommend dc to home with HHOT and will continue to follow acutely as admitted.    Follow Up Recommendations  Home health OT;Supervision - Intermittent (with mobility)    Equipment Recommendations  None recommended by OT    Recommendations for Other Services      Precautions / Restrictions Precautions Precautions: Fall (minimal risk) Precaution Comments: urostomy R abdomen Restrictions Weight Bearing Restrictions: No       Mobility Bed Mobility Overal bed mobility: Needs Assistance             General bed mobility comments: In recliner upon arrival  Transfers Overall transfer level: Needs assistance Equipment used: None Transfers: Sit to/from Stand Sit to Stand: Supervision         General transfer comment: Supervision for safety    Balance Overall balance assessment: Needs assistance;History of Falls Sitting-balance support: Feet supported Sitting balance-Leahy Scale: Good     Standing balance support: No upper extremity supported Standing balance-Leahy Scale: Fair                             ADL either performed or  assessed with clinical judgement   ADL Overall ADL's : Needs assistance/impaired                         Toilet Transfer: Ambulation;RW;Supervision/safety;Min guard (simulated at recliner) [employer] forces technical officer Details (indicate cue type and reason): Supervision-Min Guard A for safety         Functional mobility during ADLs: Min guard;Cueing for safety General ADL Comments: Reviewing EC handout with pt and wife; both verbalizing understanding. However, wife providing situation in which techniques can apply to daily routine. Pt performing ADLs at [place] level.      Vision       Perception     Praxis      Cognition Arousal/Alertness: Awake/alert Behavior During Therapy: WFL for tasks assessed/performed Overall Cognitive Status: Impaired/Different from baseline Area of Impairment: Safety/judgement;Problem solving;Memory                     Memory: Decreased short-term memory   Safety/Judgement: Decreased awareness of safety   Problem Solving: Slow processing General Comments: Requiring increased time and moments when forgetful of prior conversations. Becoming irritable during education. Overall, following commands.         Exercises     Shoulder Instructions       General Comments Wife present throughout session    Pertinent Vitals/ Pain       Pain Assessment: Faces Faces Pain Scale: No hurt Pain Intervention(s): Monitored during session  Home Living  Prior Functioning/Environment              Frequency  Min 2X/week        Progress Toward Goals  OT Goals(current goals can now be found in the care plan section)  Progress towards OT goals: Progressing toward goals  Acute Rehab OT Goals Patient Stated Goal: to return to prior level of functioning OT Goal Formulation: With patient Time For Goal Achievement: 04/28/20 Potential to Achieve Goals: Good ADL Goals Pt Will  Perform Grooming: with modified independence;standing Pt Will Perform Lower Body Dressing: with modified independence;sit to/from stand Pt Will Transfer to Toilet: with modified independence;ambulating Additional ADL Goal #1: Pt will verbalize 3-5 fall prevention strategies.  Plan Discharge plan remains appropriate    Co-evaluation                 AM-PAC OT "6 Clicks" Daily Activity     Outcome Measure   Help from another person eating meals?: None Help from another person taking care of personal grooming?: A Little Help from another person toileting, which includes using toliet, bedpan, or urinal?: A Little Help from another person bathing (including washing, rinsing, drying)?: A Little Help from another person to put on and taking off regular upper body clothing?: None Help from another person to put on and taking off regular lower body clothing?: A Little 6 Click Score: 20    End of Session    OT Visit Diagnosis: Unsteadiness on feet (R26.81);Other abnormalities of gait and mobility (R26.89);Muscle weakness (generalized) (M62.81);History of falling (Z91.81)   Activity Tolerance Patient tolerated treatment well   Patient Left  (with PT)   Nurse Communication Mobility status        Time: 6269-4854 OT Time Calculation (min): 21 min  Charges: OT General Charges $OT Visit: 1 Visit OT Treatments $Self Care/Home Management : 8-22 mins  [place], OTR/L Acute Rehab Pager: [phone] Office: [place] 04/16/2020, 12:35 PM

## 2020-04-16 NOTE — Progress Notes (Addendum)
PHARMACY CONSULT NOTE FOR:  OUTPATIENT  PARENTERAL ANTIBIOTIC THERAPY (OPAT)  Indication: Bacteremia 2/2 pyelonephritis Regimen: Pip/tazo 13.5g as a continuous infusion every 24 hours End date: 04/23/20  IV antibiotic discharge orders are pended. To discharging provider:  please sign these orders via discharge navigator,  Select New Orders & click on the button choice - Manage This Unsigned Work.    Thank you for allowing pharmacy to be a part of this patient's care.  Alfonse Spruce, PharmD PGY2 ID Pharmacy Resident [id number]  04/16/2020, 11:02 AM

## 2020-04-16 NOTE — Progress Notes (Signed)
Physical Therapy Treatment Patient Details Name: Shane [name] MRN: [id number] DOB: November 04, 1935 Today's Date: 04/16/2020    History of Present Illness 84yo male who recently fell at home; recent history of Covid January 2021 and has been weak ever since but it has been getting worse. CTH negative, but imaging did find L renal mass pending w/u. Admitted with severe sepsis secondary to UTI. PMH moderate aortic stenosis, HTN, HLD, hx Bells palsy, DM, CVA,  bladder cancer, cardiac cath, urostomy    PT Comments    Pt ambulating hallway distance this day with intermittent use of IV pole to steady self. Pt uses a walking stick intermittently at home, PT encouraged continued use of SL support as needed upon d/c. Pt proficiently navigated x5 steps this day, could not perform whole flight due to running IV. Pt plans to continue HHPT upon d/c, will continue to follow acutely.     Follow Up Recommendations  Home health PT;Supervision for mobility/OOB     Equipment Recommendations  Other (comment)    Recommendations for Other Services       Precautions / Restrictions Precautions Precautions: Fall (minimal risk) Precaution Comments: urostomy R abdomen Restrictions Weight Bearing Restrictions: No    Mobility  Bed Mobility Overal bed mobility: Needs Assistance             General bed mobility comments: up with OT  Transfers                 General transfer comment: up with OT  Ambulation/Gait Ambulation/Gait assistance: Min guard Gait Distance (Feet): 400 Feet Assistive device: IV Pole;None Gait Pattern/deviations: Step-through pattern;Decreased stride length;Trunk flexed Gait velocity: decr   General Gait Details: min guard for safety, lines/leads management. Slow and mildly unsteady gait without use of AD, pt stating he feels more comfortable with SL Support. near LOB x1 when changing directions, pt-recovered.   Stairs Stairs: Yes Stairs assistance:  Supervision Stair Management: One rail Left;Forwards;Alternating pattern Number of Stairs: 5 General stair comments: supervision for safety, slow and steady when using railing   Wheelchair Mobility    Modified Rankin (Stroke Patients Only)       Balance Overall balance assessment: Needs assistance;History of Falls   Sitting balance-Leahy Scale: Good     Standing balance support: No upper extremity supported Standing balance-Leahy Scale: Fair                              Cognition Arousal/Alertness: Awake/alert Behavior During Therapy: WFL for tasks assessed/performed Overall Cognitive Status: Within Functional Limits for tasks assessed                                        Exercises      General Comments        Pertinent Vitals/Pain Pain Assessment: Faces Faces Pain Scale: No hurt Pain Intervention(s): Monitored during session    Home Living                      Prior Function            PT Goals (current goals can now be found in the care plan section) Acute Rehab PT Goals Patient Stated Goal: to return to prior level of functioning PT Goal Formulation: With patient/family Time For Goal Achievement: 04/26/20 Potential to Achieve Goals: Good Progress towards  PT goals: Progressing toward goals    Frequency    Min 3X/week      PT Plan Current plan remains appropriate    Co-evaluation              AM-PAC PT "6 Clicks" Mobility   Outcome Measure  Help needed turning from your back to your side while in a flat bed without using bedrails?: None Help needed moving from lying on your back to sitting on the side of a flat bed without using bedrails?: A Little Help needed moving to and from a bed to a chair (including a wheelchair)?: A Little Help needed standing up from a chair using your arms (e.g., wheelchair or bedside chair)?: A Little Help needed to walk in hospital room?: A Little Help needed climbing  3-5 steps with a railing? : A Lot 6 Click Score: 18    End of Session   Activity Tolerance: Patient tolerated treatment well Patient left: in chair;with call bell/phone within reach;with family/visitor present Nurse Communication: Mobility status PT Visit Diagnosis: Unsteadiness on feet (R26.81);Difficulty in walking, not elsewhere classified (R26.2);History of falling (Z91.81)     Time: 3491-7915 PT Time Calculation (min) (ACUTE ONLY): 14 min  Charges:  $Gait Training: 8-22 mins                     Geneive Sandstrom E, PT Acute Rehabilitation Services Pager [phone]  Office [phone]     Crystal Ellwood D Candace Ramus 04/16/2020, 11:58 AM

## 2020-04-16 NOTE — TOC Progression Note (Signed)
Transition of Care [facility]) - Progression Note    Patient Details  Name: Shane [name] MRN: [id number] Date of Birth: [birth year]  Transition of Care [facility]) CM/SW Contact  Joanne Chars, LCSW Phone Number: 04/16/2020, 9:45 AM  Clinical Narrative:  CSW spoke with pt, pt wife, pt daughter and they continue to decline HH or outpt PT services.  They are aware that if they change their mind after discharge, they can contact PCP to arrange [facility].  All report that they are planning to continue to receive PCP services through their current remote health provider. No equipment needs identified.      Expected Discharge Plan: Home/Self Care Barriers to Discharge: Continued Medical Work up  Expected Discharge Plan and Services Expected Discharge Plan: Home/Self Care     Post Acute Care Choice:  (Pt declines post acute services today) Living arrangements for the past 2 months: Single Family Home                                       Social Determinants of Health (SDOH) Interventions    Readmission Risk Interventions Readmission Risk Prevention Plan 04/16/2020  Transportation Screening Complete  PCP or Specialist Appt within 3-5 Days Complete  HRI or [place] Patient refused  Social Work Consult for [place] Planning/Counseling Complete  Palliative Care Screening Not Applicable  Some recent data might be hidden

## 2020-04-16 NOTE — Progress Notes (Signed)
PROGRESS NOTE    Shane [name]  ZOX:[id number] DOB: May 23, 1936 DOA: 04/08/2020 PCP: Tammi Sou, MD   Brief Narrative:  Shane [name] is a 84 y.o. male with medical history significant of bladder cancer status post Ileal conduit, calcific pancreatitis, chronic kidney disease stage IIIa, coronary artery disease, aortic stenosis hypertension, hyperlipidemia, recurring UTIs, recurrent GI bleeds, history of CVA, diabetes, duodenal ulcer who presented to the ER with recurrent falls, fever, weakness and altered mental status.Patient apparently was trying to go to the bathroom yesterday when he had a fall. He had COVID-19 in January which he recovered from but has had 3 other episodes of pneumonia since then he has continued to be weak and debilitated.  His weakness gotten worse in the last 2 to 3 days.  He came to the ER where he was found to have Temperature 100.9 blood pressure 84/41 pulse 97 respirate 25 oxygen sat 96% room air.  Venous pH is 7.196.  Sodium 132 potassium 3.6 chloride 111 CO2 obtain glucose 200 BUN 54 creatinine 2.58 and calcium 7.9.  CK is 143 lactic acid 3.0 white count 12.8 hemoglobin 11.8.  Platelets 285.  PT 17.2 INR 1.5.  Urinalysis showed cloudy urine with many bacteria WBC more than 50 RBC 6-10 large leukocytes.  Chest x-ray showed subsegmental opacities at the left lung base probably atelectasis or scarring possible pneumonia.  Head CT without contrast showed no acute findings and met criteria for sepsis secondary to UTI and possible pneumonia.  He additionally has features of endorgan damage/AKI on CKD stage IIIa making him have severe sepsis.  He was admitted under hospital service.  Started on broad-spectrum IV antibiotics as well as IV fluids.  Subsequently his blood culture grew Enterococcus faecalis as well as Pseudomonas.  ID was consulted.  His antibiotics were switched to ampicillin and cefepime and then to Zosyn.  His creatinine improved but very slowly.   Further history was provided by the daughter that he was also complaining of left flank pain prior to his fall.  This prompted ultrasound kidney which showed suspicion of possible left renal mass.  Radiology recommended CT or MRI with and without contrast however due to elevated creatinine/AKI, we could not perform contrasted study.  I had a long discussion with radiology who recommended MRI without contrast which was done on 04/12/2020 which shows large left renal pelvis stone measuring 10 x 6 mm.  Causing hydronephrosis and pyelonephritis.  Urology was consulted.  They intend consulted IR and patient underwent PCN on the left on 04/13/2020.  Assessment & Plan:   Principal Problem:   Severe sepsis ([place]) Active Problems:   Hypertension   Diabetes mellitus without complication ([place])   UTI (urinary tract infection)   Malignant neoplasm of urinary bladder (HCC)   S/P ileal conduit (HCC)   AKI (acute kidney injury) ([place])   Lobar pneumonia (HCC)   #1 severe sepsis secondary to UTI/left pyelonephritis/community-acquired pneumonia/gram-negative bacteremia: Patient met severe sepsis criteria based on fever, leukocytosis and tachypnea and endorgan damage of acute renal failure and lactic acid of 3.  Blood culture growing Enterococcus faecalis and Pseudomonas aeruginosa.  ID was also consulted. antibiotics were switched to ampicillin and cefepime and then to Zosyn on 04/11/2020.  Sepsis physiology is improving.  S/p left PCN 04/13/2020 by IR.  Leukocytosis improved.  He remains afebrile.  ID recommends Zosyn for another 7 days.  Tunneled CVC has been ordered by ID as well.   #2 acute kidney injury on chronic  kidney disease stage IIIa/dehydration/left renal stone/left pyelonephritis/left hydronephrosis: Secondary to sepsis/ATN. Creatinine only slightly improved again today but not as much as I had expected.  After receiving more history from the daughter yesterday where she told me that patient was complaining  of left flank pain before the fall, ultrasound kidney was done which showed suspicion of left renal mass. Radiology recommended CT or MRI with and without contrast however due to elevated creatinine/AKI, we could not perform contrasted study.  I had a long discussion with radiology who recommended MRI without contrast which was done on 04/12/2020 which shows large left renal pelvis stone measuring 10 x 6 mm.  Causing hydronephrosis and pyelonephritis.  Now s/p left PCN by IR.  Creatinine was still not improving per expectorations.  Discussed with Dr. Claudia Desanctis of urology today.  Ordered CT abdomen pelvis without contrast per her recommendations.  CT shows no hydronephrosis.  Likely, his creatinine has shown some improvement today.  Hoping that we will see more improvement tomorrow and likely discharge him home.  #3  Non-anion gap metabolic acidosis: Likely due to AKI.  Resolved  #4.  Hypokalemia: Resolved  #6 history of urinary bladder cancer: Patient has had surgery on treatment.  Defer to his oncologist.  #7 diabetes type II: Continue with sliding scale insulin and supportive care.  #8 hypertension: Controlled.  Patient takes only lisinopril at home which is on hold due to AKI.  #9 hypomagnesemia: Resolved  #10: Deconditioning: PT OT consulted.  Home health recommended but patient and family declined that.  DVT prophylaxis: heparin injection 5,000 Units Start: 04/09/20 0715   Code Status: Full Code  Family Communication: None present at bedside.  Plan of care discussed with patient in length with patient and he verbalized understanding and agreed with it.   Status is: Inpatient  Remains inpatient appropriate because:Inpatient level of care appropriate due to severity of illness   Dispo: The patient is from: Home              Anticipated d/c is to: Home with home health              Anticipated d/c date is: 1 to 2 days              Patient currently is not medically stable to  d/c.        Estimated body mass index is 29.35 kg/m as calculated from the following:   Height as of this encounter: $RemoveBeforeD'5\' 7"'[id number]$  (1.702 m).   Weight as of this encounter: 85 kg.      Nutritional status:  Nutrition Problem: Moderate Malnutrition Etiology: chronic illness (recurrent PNA/UTIs)   Signs/Symptoms: mild fat depletion, moderate muscle depletion   Interventions: Refer to RD note for recommendations    Consultants:   ID  Urology  IR  Procedures:  Left PCN  Antimicrobials:  Anti-infectives (From admission, onward)   Start     Dose/Rate Route Frequency Ordered Stop   04/13/20 0922  ciprofloxacin (CIPRO) 400 MG/200ML IVPB  Status:  Discontinued       Note to Pharmacy: Domenick Bookbinder   : cabinet override      04/13/20 0922 04/13/20 1147   04/11/20 1800  piperacillin-tazobactam (ZOSYN) IVPB 3.375 g        3.375 g 12.5 mL/hr over 240 Minutes Intravenous Every 8 hours 04/11/20 1547     04/10/20 2000  ceFEPIme (MAXIPIME) 2 g in sodium chloride 0.9 % 100 mL IVPB  Status:  Discontinued  2 g 200 mL/hr over 30 Minutes Intravenous Every 24 hours 04/09/20 1806 04/11/20 1524   04/09/20 2130  ceFEPIme (MAXIPIME) 2 g in sodium chloride 0.9 % 100 mL IVPB  Status:  Discontinued        2 g 200 mL/hr over 30 Minutes Intravenous Every 24 hours 04/08/20 2207 04/09/20 0957   04/09/20 2000  ampicillin (OMNIPEN) 2 g in sodium chloride 0.9 % 100 mL IVPB  Status:  Discontinued        2 g 300 mL/hr over 20 Minutes Intravenous Every 8 hours 04/09/20 1806 04/11/20 1524   04/09/20 1900  ceFEPIme (MAXIPIME) 2 g in sodium chloride 0.9 % 100 mL IVPB        2 g 200 mL/hr over 30 Minutes Intravenous  Once 04/09/20 1806 04/09/20 2030   04/09/20 0715  cefTRIAXone (ROCEPHIN) 2 g in sodium chloride 0.9 % 100 mL IVPB  Status:  Discontinued        2 g 200 mL/hr over 30 Minutes Intravenous Every 24 hours 04/09/20 0711 04/09/20 1804   04/09/20 0715  azithromycin (ZITHROMAX) 500 mg in  sodium chloride 0.9 % 250 mL IVPB  Status:  Discontinued        500 mg 250 mL/hr over 60 Minutes Intravenous Every 24 hours 04/09/20 0711 04/09/20 1804   04/08/20 2207  vancomycin variable dose per unstable renal function (pharmacist dosing)  Status:  Discontinued         Does not apply See admin instructions 04/08/20 2207 04/09/20 0956   04/08/20 2130  ceFEPIme (MAXIPIME) 2 g in sodium chloride 0.9 % 100 mL IVPB        2 g 200 mL/hr over 30 Minutes Intravenous  Once 04/08/20 2119 04/08/20 2213   04/08/20 2130  metroNIDAZOLE (FLAGYL) IVPB 500 mg        500 mg 100 mL/hr over 60 Minutes Intravenous  Once 04/08/20 2119 04/08/20 2231   04/08/20 2130  vancomycin (VANCOCIN) IVPB 1000 mg/200 mL premix  Status:  Discontinued        1,000 mg 200 mL/hr over 60 Minutes Intravenous  Once 04/08/20 2119 04/08/20 2126   04/08/20 2130  vancomycin (VANCOREADY) IVPB 1750 mg/350 mL        1,750 mg 175 mL/hr over 120 Minutes Intravenous  Once 04/08/20 2126 04/08/20 2351         Subjective: Seen and examined.  Wife at the bedside.  Doing excellent.  No complaints.  Objective: Vitals:   04/15/20 0811 04/15/20 1715 04/15/20 1940 04/16/20 0723  BP: 110/62 131/63 120/86 114/68  Pulse: (!) 57 60 64 (!) 54  Resp: $Remo'16 16 18 16  'btoCN$ Temp: 98.1 F (36.7 C) 98.1 F (36.7 C) 98 F (36.7 C) 98.1 F (36.7 C)  TempSrc: Oral Oral Oral Oral  SpO2: 100% 99% 100% 97%  Weight:      Height:        Intake/Output Summary (Last 24 hours) at 04/16/2020 1154 Last data filed at 04/16/2020 0830 Gross per 24 hour  Intake 250 ml  Output 2670 ml  Net -2420 ml   Filed Weights   04/08/20 2104  Weight: 85 kg    Examination: General exam: Appears calm and comfortable  Respiratory system: Clear to auscultation. Respiratory effort normal. Cardiovascular system: S1 & S2 heard, RRR. No JVD, murmurs, rubs, gallops or clicks. No pedal edema. Gastrointestinal system: Abdomen is nondistended, soft and nontender. No organomegaly  or masses felt. Normal bowel sounds heard.  Urostomy and  PCN bags have clear/yellow color urine. Central nervous system: Alert and oriented. No focal neurological deficits. Extremities: Symmetric 5 x 5 power. Skin: No rashes, lesions or ulcers.  Psychiatry: Judgement and insight appear normal. Mood & affect appropriate.   Data Reviewed: I have personally reviewed following labs and imaging studies  CBC: Recent Labs  Lab 04/12/20 0601 04/13/20 0150 04/14/20 0125 04/15/20 0132 04/16/20 0331  WBC 8.8 8.4 19.3* 10.9* 8.0  NEUTROABS 6.7 5.9 16.7* 7.6 4.4  HGB 10.1* 10.4* 9.2* 10.8* 10.6*  HCT 30.8* 32.3* 29.8* 34.1* 33.6*  MCV 91.4 92.3 94.9 95.8 95.7  PLT 230 239 208 261 093   Basic Metabolic Panel: Recent Labs  Lab 04/10/20 0522 04/10/20 0522 04/11/20 0054 04/11/20 0054 04/12/20 0601 04/13/20 0150 04/14/20 1055 04/15/20 0132 04/16/20 0331  NA 137   < > 135   < > 137 137 138 136 141  K 3.1*   < > 4.1   < > 3.3* 4.4 3.9 4.4 4.1  CL 110   < > 107   < > 104 105 105 105 107  CO2 15*   < > 16*   < > 23 22 21* 20* 24  GLUCOSE 153*   < > 125*   < > 129* 98 147* 142* 132*  BUN 46*   < > 42*   < > 37* 38* 36* 34* 25*  CREATININE 2.39*   < > 2.36*   < > 2.26* 2.41* 2.58* 2.27* 2.17*  CALCIUM 7.8*   < > 7.8*   < > 7.7* 7.8* 8.0* 7.9* 8.2*  MG 1.5*  --  1.9  --   --   --  2.0  --   --    < > = values in this interval not displayed.   GFR: Estimated Creatinine Clearance: 27.4 mL/min (A) (by C-G formula based on SCr of 2.17 mg/dL (H)). Liver Function Tests: No results for input(s): AST, ALT, ALKPHOS, BILITOT, PROT, ALBUMIN in the last 168 hours. No results for input(s): LIPASE, AMYLASE in the last 168 hours. No results for input(s): AMMONIA in the last 168 hours. Coagulation Profile: Recent Labs  Lab 04/12/20 1651  INR 1.3*   Cardiac Enzymes: No results for input(s): CKTOTAL, CKMB, CKMBINDEX, TROPONINI in the last 168 hours. BNP (last 3 results) No results for input(s):  PROBNP in the last 8760 hours. HbA1C: No results for input(s): HGBA1C in the last 72 hours. CBG: Recent Labs  Lab 04/15/20 1149 04/15/20 1715 04/15/20 1945 04/15/20 2013 04/16/20 0725  GLUCAP 148* 147* 139* 188* 126*   Lipid Profile: No results for input(s): CHOL, HDL, LDLCALC, TRIG, CHOLHDL, LDLDIRECT in the last 72 hours. Thyroid Function Tests: No results for input(s): TSH, T4TOTAL, FREET4, T3FREE, THYROIDAB in the last 72 hours. Anemia Panel: No results for input(s): VITAMINB12, FOLATE, FERRITIN, TIBC, IRON, RETICCTPCT in the last 72 hours. Sepsis Labs: No results for input(s): PROCALCITON, LATICACIDVEN in the last 168 hours.  Recent Results (from the past 240 hour(s))  Respiratory Panel by RT PCR (Flu A&B, Covid) - Nasopharyngeal Swab     Status: None   Collection Time: 04/08/20  9:00 PM   Specimen: Nasopharyngeal Swab  Result Value Ref Range Status   SARS Coronavirus 2 by RT PCR NEGATIVE NEGATIVE Final    Comment: (NOTE) SARS-CoV-2 target nucleic acids are NOT DETECTED.  The SARS-CoV-2 RNA is generally detectable in upper respiratoy specimens during the acute phase of infection. The lowest concentration of SARS-CoV-2 viral copies this  assay can detect is 131 copies/mL. A negative result does not preclude SARS-Cov-2 infection and should not be used as the sole basis for treatment or other patient management decisions. A negative result may occur with  improper specimen collection/handling, submission of specimen other than nasopharyngeal swab, presence of viral mutation(s) within the areas targeted by this assay, and inadequate number of viral copies (<131 copies/mL). A negative result must be combined with clinical observations, patient history, and epidemiological information. The expected result is Negative.  Fact Sheet for Patients:  PinkCheek.be  Fact Sheet for Healthcare Providers:   GravelBags.it  This test is no t yet approved or cleared by the [place] FDA and  has been authorized for detection and/or diagnosis of SARS-CoV-2 by FDA under an Emergency Use Authorization (EUA). This EUA will remain  in effect (meaning this test can be used) for the duration of the COVID-19 declaration under Section 564(b)(1) of the Act, 21 U.S.C. section 360bbb-3(b)(1), unless the authorization is terminated or revoked sooner.     Influenza A by PCR NEGATIVE NEGATIVE Final   Influenza B by PCR NEGATIVE NEGATIVE Final    Comment: (NOTE) The Xpert Xpress SARS-CoV-2/FLU/RSV assay is intended as an aid in  the diagnosis of influenza from Nasopharyngeal swab specimens and  should not be used as a sole basis for treatment. Nasal washings and  aspirates are unacceptable for Xpert Xpress SARS-CoV-2/FLU/RSV  testing.  Fact Sheet for Patients: PinkCheek.be  Fact Sheet for Healthcare Providers: GravelBags.it  This test is not yet approved or cleared by the [place] FDA and  has been authorized for detection and/or diagnosis of SARS-CoV-2 by  FDA under an Emergency Use Authorization (EUA). This EUA will remain  in effect (meaning this test can be used) for the duration of the  Covid-19 declaration under Section 564(b)(1) of the Act, 21  U.S.C. section 360bbb-3(b)(1), unless the authorization is  terminated or revoked. Performed at [facility], [place] [address], [place], [place] [address]   Blood Culture (routine x 2)     Status: Abnormal   Collection Time: 04/08/20  9:25 PM   Specimen: BLOOD LEFT ARM  Result Value Ref Range Status   Specimen Description BLOOD LEFT ARM  Final   Special Requests   Final    BOTTLES DRAWN AEROBIC AND ANAEROBIC Blood Culture adequate volume   Culture  Setup Time   Final    GRAM POSITIVE COCCI IN CHAINS GRAM NEGATIVE RODS AEROBIC BOTTLE ONLY CRITICAL  RESULT CALLED TO, READ BACK BY AND VERIFIED WITH: PHARMD F WILSON 04/09/20 AT 64 SK GRAM POSITIVE RODS GRAM NEGATIVE RODS ANAEROBIC BOTTLE ONLY CRITICAL RESULT CALLED TO, READ BACK BY AND VERIFIED WITHTillman Sers PHARMD 1700 04/10/20 A BROWNING    Culture (A)  Final    ENTEROCOCCUS FAECALIS PSEUDOMONAS AERUGINOSA PREVOTELLA MELANINOGENICA BETA LACTAMASE POSITIVE ACTINOTIGNUM SCHAALII Performed at [facility], [place] [address], [place], [place] [address]    Report Status 04/15/2020 FINAL  Final   Organism ID, Bacteria PSEUDOMONAS AERUGINOSA  Final   Organism ID, Bacteria ENTEROCOCCUS FAECALIS  Final      Susceptibility   Enterococcus faecalis - MIC*    AMPICILLIN <=2 SENSITIVE Sensitive     VANCOMYCIN 2 SENSITIVE Sensitive     GENTAMICIN SYNERGY SENSITIVE Sensitive     * ENTEROCOCCUS FAECALIS   Pseudomonas aeruginosa - MIC*    CEFTAZIDIME 4 SENSITIVE Sensitive     CIPROFLOXACIN >=4 RESISTANT Resistant     GENTAMICIN <=  1 SENSITIVE Sensitive     IMIPENEM 2 SENSITIVE Sensitive     PIP/TAZO 8 SENSITIVE Sensitive     CEFEPIME 2 SENSITIVE Sensitive     * PSEUDOMONAS AERUGINOSA  Urine culture     Status: Abnormal   Collection Time: 04/08/20  9:25 PM   Specimen: Urine, Clean Catch  Result Value Ref Range Status   Specimen Description URINE, CLEAN CATCH  Final   Special Requests   Final    NONE Performed at [facility], [place] [address] [place], [place] [address]    Culture MULTIPLE SPECIES PRESENT, SUGGEST RECOLLECTION (A)  Final   Report Status 04/10/2020 FINAL  Final  Blood Culture ID Panel (Reflexed)     Status: Abnormal   Collection Time: 04/08/20  9:25 PM  Result Value Ref Range Status   Enterococcus faecalis DETECTED (A) NOT DETECTED Final    Comment: CRITICAL RESULT CALLED TO, READ BACK BY AND VERIFIED WITH: PHARMD F WILSON 04/09/20 AT 1724 SK    Enterococcus Faecium NOT DETECTED NOT DETECTED Final   Listeria monocytogenes NOT DETECTED NOT DETECTED Final    Staphylococcus species NOT DETECTED NOT DETECTED Final   Staphylococcus aureus (BCID) NOT DETECTED NOT DETECTED Final   Staphylococcus epidermidis NOT DETECTED NOT DETECTED Final   Staphylococcus lugdunensis NOT DETECTED NOT DETECTED Final   Streptococcus species NOT DETECTED NOT DETECTED Final   Streptococcus agalactiae NOT DETECTED NOT DETECTED Final   Streptococcus pneumoniae NOT DETECTED NOT DETECTED Final   Streptococcus pyogenes NOT DETECTED NOT DETECTED Final   A.calcoaceticus-baumannii NOT DETECTED NOT DETECTED Final   Bacteroides fragilis NOT DETECTED NOT DETECTED Final   Enterobacterales NOT DETECTED NOT DETECTED Final   Enterobacter cloacae complex NOT DETECTED NOT DETECTED Final   Escherichia coli NOT DETECTED NOT DETECTED Final   Klebsiella aerogenes NOT DETECTED NOT DETECTED Final   Klebsiella oxytoca NOT DETECTED NOT DETECTED Final   Klebsiella pneumoniae NOT DETECTED NOT DETECTED Final   Proteus species NOT DETECTED NOT DETECTED Final   Salmonella species NOT DETECTED NOT DETECTED Final   Serratia marcescens NOT DETECTED NOT DETECTED Final   Haemophilus influenzae NOT DETECTED NOT DETECTED Final   Neisseria meningitidis NOT DETECTED NOT DETECTED Final   Pseudomonas aeruginosa DETECTED (A) NOT DETECTED Final    Comment: CRITICAL RESULT CALLED TO, READ BACK BY AND VERIFIED WITH: PHARMD F WILSON 04/09/20 AT 1724 SK    Stenotrophomonas maltophilia NOT DETECTED NOT DETECTED Final   Candida albicans NOT DETECTED NOT DETECTED Final   Candida auris NOT DETECTED NOT DETECTED Final   Candida glabrata NOT DETECTED NOT DETECTED Final   Candida krusei NOT DETECTED NOT DETECTED Final   Candida parapsilosis NOT DETECTED NOT DETECTED Final   Candida tropicalis NOT DETECTED NOT DETECTED Final   Cryptococcus neoformans/gattii NOT DETECTED NOT DETECTED Final   CTX-M ESBL NOT DETECTED NOT DETECTED Final   Carbapenem resistance IMP NOT DETECTED NOT DETECTED Final   Carbapenem resistance  KPC NOT DETECTED NOT DETECTED Final   Carbapenem resistance NDM NOT DETECTED NOT DETECTED Final   Vancomycin resistance NOT DETECTED NOT DETECTED Final   Carbapenem resistance VIM NOT DETECTED NOT DETECTED Final    Comment: Performed at [facility], [address], [place], [place] [address]  Blood Culture (routine x 2)     Status: None   Collection Time: 04/08/20  9:34 PM   Specimen: BLOOD RIGHT HAND  Result Value Ref Range Status   Specimen Description BLOOD RIGHT HAND  Final   Special Requests   Final    BOTTLES DRAWN AEROBIC AND ANAEROBIC Blood Culture results may not be optimal due to an inadequate volume of blood received in culture bottles   Culture   Final    NO GROWTH 5 DAYS Performed at [facility], [address], [place], [place] [address]    Report Status 04/13/2020 FINAL  Final  Culture, blood (routine x 2)     Status: None   Collection Time: 04/11/20 11:50 AM   Specimen: BLOOD RIGHT HAND  Result Value Ref Range Status   Specimen Description BLOOD RIGHT HAND  Final   Special Requests   Final    BOTTLES DRAWN AEROBIC AND ANAEROBIC Blood Culture adequate volume   Culture   Final    NO GROWTH 5 DAYS Performed at [facility], [address] [place], [place] [address]    Report Status 04/16/2020 FINAL  Final  Culture, blood (routine x 2)     Status: None   Collection Time: 04/11/20 12:00 PM   Specimen: BLOOD RIGHT ARM  Result Value Ref Range Status   Specimen Description BLOOD RIGHT ARM  Final   Special Requests   Final    BOTTLES DRAWN AEROBIC AND ANAEROBIC Blood Culture adequate volume   Culture   Final    NO GROWTH 5 DAYS Performed at [facility], [address], [place], [place] [address]    Report Status 04/16/2020 FINAL  Final  Aerobic/Anaerobic Culture (surgical/deep wound)     Status: None (Preliminary result)   Collection Time: 04/13/20 10:00 AM   Specimen: Fluid; Urine  Result Value Ref Range Status   Specimen Description  FLUID  Final   Special Requests LEFT KIDNEY  Final   Gram Stain   Final    ABUNDANT WBC PRESENT,BOTH PMN AND MONONUCLEAR ABUNDANT GRAM POSITIVE COCCI Performed at [facility], [address], [place], [place] [address]    Culture   Final    ABUNDANT PSEUDOMONAS AERUGINOSA ABUNDANT ENTEROCOCCUS FAECALIS    Report Status PENDING  Incomplete   Organism ID, Bacteria PSEUDOMONAS AERUGINOSA  Final   Organism ID, Bacteria ENTEROCOCCUS FAECALIS  Final      Susceptibility   Enterococcus faecalis - MIC*    AMPICILLIN <=2 SENSITIVE Sensitive     VANCOMYCIN 2 SENSITIVE Sensitive     GENTAMICIN SYNERGY SENSITIVE Sensitive     * ABUNDANT ENTEROCOCCUS FAECALIS   Pseudomonas aeruginosa - MIC*    CEFTAZIDIME 4 SENSITIVE Sensitive     CIPROFLOXACIN 2 INTERMEDIATE Intermediate     GENTAMICIN <=1 SENSITIVE Sensitive     IMIPENEM 2 SENSITIVE Sensitive     PIP/TAZO 8 SENSITIVE Sensitive     CEFEPIME 2 SENSITIVE Sensitive     * ABUNDANT PSEUDOMONAS AERUGINOSA      Radiology Studies: CT ABDOMEN PELVIS WO CONTRAST  Result Date: 04/15/2020 CLINICAL DATA:  Hydronephrosis. EXAM: CT ABDOMEN AND PELVIS WITHOUT CONTRAST TECHNIQUE: Multidetector CT imaging of the abdomen and pelvis was performed following the standard protocol without IV contrast. COMPARISON:  December 19, 2017.  April 11, 2020. FINDINGS: Lower chest: No acute abnormality. Hepatobiliary: No focal liver abnormality is seen. No gallstones, gallbladder wall thickening, or biliary dilatation. Pancreas: Pancreatic calcifications are noted consistent with chronic pancreatitis. Some degree of atrophy is present as well. No definite acute abnormality is noted. Spleen: Normal in size without focal abnormality. Adrenals/Urinary Tract: Adrenal glands appear normal. Status post left percutaneous nephrostomy. Bilateral  nephrolithiasis is noted. No hydronephrosis is noted. Status post cystectomy with ileal conduit seen in the right lower quadrant.  Stomach/Bowel: Stomach is within normal limits. Appendix appears normal. No evidence of bowel wall thickening, distention, or inflammatory changes. Vascular/Lymphatic: Aortic atherosclerosis. No enlarged abdominal or pelvic lymph nodes. Reproductive: Status post prostatectomy. Other: No abdominal wall hernia or abnormality. No abdominopelvic ascites. Musculoskeletal: No acute or significant osseous findings. IMPRESSION: 1. Bilateral nephrolithiasis. No hydronephrosis is noted. Status post left percutaneous nephrostomy. 2. Status post cystectomy with ileal conduit seen in the right lower quadrant. 3. Pancreatic calcifications are noted consistent with chronic pancreatitis. 4. Aortic atherosclerosis. Aortic Atherosclerosis (ICD10-I70.0). Electronically Signed   By: Marijo Conception M.D.   On: 04/15/2020 10:21    Scheduled Meds: . feeding supplement (GLUCERNA SHAKE)  237 mL Oral TID BM  . ferrous sulfate  325 mg Oral Q breakfast  . heparin  5,000 Units Subcutaneous Q8H  . insulin aspart  0-5 Units Subcutaneous QHS  . insulin aspart  0-9 Units Subcutaneous TID WC  . insulin glargine  5 Units Subcutaneous QHS  . loratadine  10 mg Oral Daily  . multivitamin with minerals  1 tablet Oral Daily  . omega-3 acid ethyl esters  1 g Oral BID  . pravastatin  20 mg Oral Daily  . sodium chloride flush  5 mL Intracatheter Q8H   Continuous Infusions: . sodium chloride 75 mL/hr at 04/15/20 1809  . piperacillin-tazobactam (ZOSYN)  IV 3.375 g (04/16/20 0855)     LOS: 7 days   Time spent: 27 minutes   Darliss Cheney, MD [facility]  04/16/2020, 11:54 AM   To contact the attending provider between 7A-7P or the covering provider during after hours 7P-7A, please log into the web site [email or web address].

## 2020-04-17 ENCOUNTER — Inpatient Hospital Stay (HOSPITAL_COMMUNITY): Payer: Medicare Other

## 2020-04-17 HISTORY — PX: IR PERC TUN PERIT CATH WO PORT S&I /IMAG: IMG2327

## 2020-04-17 HISTORY — PX: IR US GUIDE VASC ACCESS RIGHT: IMG2390

## 2020-04-17 HISTORY — PX: IR FLUORO GUIDE CV LINE RIGHT: IMG2283

## 2020-04-17 LAB — CBC WITH DIFFERENTIAL/PLATELET
Abs Immature Granulocytes: 0.05 10*3/uL (ref 0.00–0.07)
Basophils Absolute: 0.1 10*3/uL (ref 0.0–0.1)
Basophils Relative: 1 %
Eosinophils Absolute: 0.2 10*3/uL (ref 0.0–0.5)
Eosinophils Relative: 3 %
HCT: 33.8 % — ABNORMAL LOW (ref 39.0–52.0)
Hemoglobin: 10.4 g/dL — ABNORMAL LOW (ref 13.0–17.0)
Immature Granulocytes: 1 %
Lymphocytes Relative: 40 %
Lymphs Abs: 2.8 10*3/uL (ref 0.7–4.0)
MCH: 29.4 pg (ref 26.0–34.0)
MCHC: 30.8 g/dL (ref 30.0–36.0)
MCV: 95.5 fL (ref 80.0–100.0)
Monocytes Absolute: 0.4 10*3/uL (ref 0.1–1.0)
Monocytes Relative: 6 %
Neutro Abs: 3.4 10*3/uL (ref 1.7–7.7)
Neutrophils Relative %: 49 %
Platelets: 282 10*3/uL (ref 150–400)
RBC: 3.54 MIL/uL — ABNORMAL LOW (ref 4.22–5.81)
RDW: 14.6 % (ref 11.5–15.5)
WBC: 6.9 10*3/uL (ref 4.0–10.5)
nRBC: 0 % (ref 0.0–0.2)

## 2020-04-17 LAB — BASIC METABOLIC PANEL
Anion gap: 10 (ref 5–15)
BUN: 25 mg/dL — ABNORMAL HIGH (ref 8–23)
CO2: 23 mmol/L (ref 22–32)
Calcium: 8.2 mg/dL — ABNORMAL LOW (ref 8.9–10.3)
Chloride: 106 mmol/L (ref 98–111)
Creatinine, Ser: 1.97 mg/dL — ABNORMAL HIGH (ref 0.61–1.24)
GFR calc Af Amer: 36 mL/min — ABNORMAL LOW (ref >60)
GFR calc non Af Amer: 31 mL/min — ABNORMAL LOW (ref >60)
Glucose, Bld: 121 mg/dL — ABNORMAL HIGH (ref 70–99)
Potassium: 4.1 mmol/L (ref 3.5–5.1)
Sodium: 139 mmol/L (ref 135–145)

## 2020-04-17 LAB — GLUCOSE, CAPILLARY
Glucose-Capillary: 108 mg/dL — ABNORMAL HIGH (ref 70–99)
Glucose-Capillary: 175 mg/dL — ABNORMAL HIGH (ref 70–99)

## 2020-04-17 MED ORDER — HEPARIN SOD (PORK) LOCK FLUSH 100 UNIT/ML IV SOLN
INTRAVENOUS | Status: AC
  Filled 2020-04-17: qty 5

## 2020-04-17 MED ORDER — LIDOCAINE-EPINEPHRINE 1 %-1:100000 IJ SOLN
INTRAMUSCULAR | Status: DC | PRN
  Administered 2020-04-17: 10 mL

## 2020-04-17 MED ORDER — CHLORHEXIDINE GLUCONATE CLOTH 2 % EX PADS
6.0000 | MEDICATED_PAD | Freq: Every day | CUTANEOUS | Status: DC
  Administered 2020-04-17: 6 via TOPICAL

## 2020-04-17 MED ORDER — LIDOCAINE-EPINEPHRINE 1 %-1:100000 IJ SOLN
INTRAMUSCULAR | Status: AC
  Filled 2020-04-17: qty 1

## 2020-04-17 MED ORDER — PIPERACILLIN-TAZOBACTAM IV (FOR PTA / DISCHARGE USE ONLY): 13.5000 g | INTRAVENOUS | 0 refills | Status: AC

## 2020-04-17 MED ORDER — HEPARIN SOD (PORK) LOCK FLUSH 100 UNIT/ML IV SOLN
INTRAVENOUS | Status: DC | PRN
  Administered 2020-04-17: 500 [IU] via INTRAVENOUS

## 2020-04-17 NOTE — Discharge Instructions (Signed)

## 2020-04-17 NOTE — Care Management Important Message (Signed)
Important Message  Patient Details  Name: Shane [name] MRN: [id number] Date of Birth: 30-Apr-1936   Medicare Important Message Given:  Yes     Orbie Pyo 04/17/2020, 4:27 PM

## 2020-04-17 NOTE — Discharge Summary (Signed)
Physician Discharge Summary  Shalin Vonbargen ZDG:[id number] DOB: 10-13-1935 DOA: 04/08/2020  PCP: Tammi Sou, MD  Admit date: 04/08/2020 Discharge date: 04/17/2020  Admitted From: Home Disposition: Home  Recommendations for Outpatient Follow-up:  1. Follow up with PCP in 1-2 weeks 2. Follow-up with urology/Dr. Tresa Moore within 1 to 2 weeks 3. Please obtain BMP/CBC in one week 4. Please follow up with your PCP on the following pending results: Unresulted Labs (From admission, onward)          Start     Ordered   04/10/20 0500  CBC with Differential/Platelet  Daily,   R      04/09/20 1441           Home Health: Yes Equipment/Devices: Shower stool  Discharge Condition: Stable CODE STATUS: Full code Diet recommendation: Previous home diet/cardiac diet  Subjective: Seen and examined.  Wife at the bedside.  He has no complaints.  He wants to go home.  Brief/Interim Summary: Shane Mcphee Loweis a 84 y.o.malewith medical history significant ofbladder cancer status postIleal conduit, calcific pancreatitis, chronic kidney disease stage IIIa, coronary artery disease, aortic stenosis hypertension, hyperlipidemia, recurring UTIs, recurrent GI bleeds, history of CVA, diabetes, duodenal ulcer who presented to the ER with recurrent falls, fever, weakness and altered mental status.Shane [name] apparently wastrying to go to the bathroom when he had a fall.He had COVID-19 in January which he recovered from but has had 3 other episodes of pneumonia since then he has continued to be weak and debilitated. His weakness gotten worse in the last 2 to 3 days. He came to the ER where he was found to have Temperature 100.9 blood pressure 84/41 pulse 97 respirate 25 oxygen sat 96% room air. Venous pH is 7.196. Sodium 132 potassium 3.6 chloride 111 CO2 obtain glucose 200 BUN 54 creatinine 2.58 and calcium 7.9. CK is 143 lactic acid 3.0 white count 12.8 hemoglobin 11.8. Platelets 285. PT 17.2 INR  1.5. Urinalysis showed cloudy urine with many bacteria WBC more than 50 RBC 6-10 large leukocytes. Chest x-ray showed subsegmental opacities at the left lung base probably atelectasis or scarring possible pneumonia. Head CT without contrast showed no acute findings and met criteria for sepsissecondary to UTI and possible pneumonia. He additionally had features of endorgan damage/AKI on CKD stage IIIa making him have severe sepsis.  He was admitted under hospital service.  Started on broad-spectrum IV antibiotics as well as IV fluids.  Subsequently his blood culture grew Enterococcus faecalis as well as Pseudomonas.  ID was consulted.  His antibiotics were switched to ampicillin and cefepime and then to Zosyn.  His creatinine improved but very slowly.  Further history was provided by the daughter that he was also complaining of left flank pain prior to his fall.  This prompted ultrasound kidney which showed suspicion of possible left renal mass.  Radiology recommended CT or MRI with and without contrast however due to elevated creatinine/AKI, we could not perform contrasted study.  I had a long discussion with radiology who, in his current scenario recommended MRI without contrast which was done on 04/12/2020 which shows large left renal pelvis stone measuring 10 x 6 mm.  Causing hydronephrosis and pyelonephritis.  Urology was consulted.  They consulted IR and Shane [name] underwent PCN on the left on 04/13/2020.  Culture was taken from fluid from that area as well and that also is growing Enterococcus faecalis as well as Pseudomonas.  Shane [name]'s creatinine since then has a started to improve but very slowly.  Currently his creatinine is 1.97, almost doubled at his baseline.  He has been having good urine output.  I discussed this with the Shane [name] and his wife at the bedside and Shane [name] was adamant to go home today and requested to discharge.  Due to the fact that his creatinine has not improved to my expectations, I  offered him to stay another night so we can recheck his creatinine tomorrow but he declined that offer and decided to go home.  ID had seen the Shane [name] and recommended 7 more days of IV Zosyn at home.  Home infusion has been arranged for the Shane [name].  Wife was at the bedside who asked several questions regarding infusion other things which were answered to the best of her satisfaction.  Shane [name] is being discharged in stable condition.  Discharge Diagnoses:  Principal Problem:   Severe sepsis ([place]) Active Problems:   Hypertension   Diabetes mellitus without complication ([place])   UTI (urinary tract infection)   Malignant neoplasm of urinary bladder ([place])   S/P ileal conduit ([place])   AKI (acute kidney injury) ([place])   Lobar pneumonia [facility])    Discharge Instructions  Discharge Instructions    [facility] pharmacist to adjust dose for Vancomycin, Aminoglycosides and other anti-infective therapies as requested by physician.   Complete by: As directed    [facility] to provide Cath Flo 2mg    Complete by: As directed    Administer for PICC line occlusion and as ordered by physician for other access device issues.   Anaphylaxis Kit: Provided to treat any anaphylactic reaction to the medication being provided to the Shane [name] if First Dose or when requested by physician   Complete by: As directed    Epinephrine 1mg /ml vial / amp: Administer 0.3mg  (0.27ml) subcutaneously once for moderate to severe anaphylaxis, nurse to call physician and pharmacy when reaction occurs and call 911 if needed for immediate care   Diphenhydramine 50mg /ml IV vial: Administer 25-50mg  IV/IM PRN for first dose reaction, rash, itching, mild reaction, nurse to call physician and pharmacy when reaction occurs   Sodium Chloride 0.9% NS 570ml IV: Administer if needed for hypovolemic blood pressure drop or as ordered by physician after call to physician with anaphylactic reaction   Change dressing on IV access  line weekly and PRN   Complete by: As directed    Discharge Shane [name]   Complete by: As directed    Discharge disposition: 06-Home-Health Care Svc   Discharge Shane [name] date: 04/17/2020   Flush IV access with Sodium Chloride 0.9% and Heparin 10 units/ml or 100 units/ml   Complete by: As directed    Home infusion instructions - [facility]   Complete by: As directed    Instructions: Flush IV access with Sodium Chloride 0.9% and Heparin 10units/ml or 100units/ml   Change dressing on IV access line: Weekly and PRN   Instructions Cath Flo 2mg : Administer for PICC Line occlusion and as ordered by physician for other access device   [facility] pharmacist to adjust dose for: Vancomycin, Aminoglycosides and other anti-infective therapies as requested by physician   Method of administration may be changed at the discretion of home infusion pharmacist based upon assessment of the Shane [name] and/or caregiver's ability to self-administer the medication ordered   Complete by: As directed      Allergies as of 04/17/2020      Reactions   Other Swelling, Other (See Comments)   Pt states had swelling of the eye when  having eye surgery and the anesthetic placed in eye- recovered the same day      Medication List    STOP taking these medications   azithromycin 250 MG tablet Commonly known as: ZITHROMAX   lisinopril 20 MG tablet Commonly known as: ZESTRIL     TAKE these medications   albuterol 108 (90 Base) MCG/ACT inhaler Commonly known as: VENTOLIN HFA Inhale 2 puffs into the lungs every 6 (six) hours as needed. What changed: reasons to take this   aspirin EC 81 MG tablet Take 1 tablet (81 mg total) by mouth daily.   benzonatate 100 MG capsule Commonly known as: TESSALON Take 1 capsule (100 mg total) by mouth 3 (three) times daily as needed for cough.   cetirizine 10 MG tablet Commonly known as: ZYRTEC Take 1 tablet (10 mg total) by mouth daily. What changed:   when to  take this  reasons to take this   FERROUS SULFATE PO Take 27 mg by mouth. Takes 1 daily   Ferrous Sulfate 27 MG Tabs Take 27 mg by mouth daily with breakfast.   FISH OIL PO Take 1 capsule by mouth daily.   FreeStyle Libre 14 Day Sensor Misc USE AS DIRECTED What changed:   how much to take  how to take this  when to take this   insulin aspart 100 UNIT/ML FlexPen Commonly known as: NovoLOG FlexPen CBG < 70: implement hypoglycemia protocol CBG 70 - 120: 3 units CBG 121 - 150: 5 units CBG 151 - 200: 6 units CBG 201 - 250: 8 units CBG 251 - 300: 11 units CBG 301 - 350: 14 units CBG 351 - 400: 18 units CBG > 400: call MD   Lancets 30G Misc Use to check blood sugar 4 times daily   Lantus SoloStar 100 UNIT/ML Solostar Pen Generic drug: insulin glargine INJECT 5 UNITS SUBQ AT BEDTIME What changed:   how much to take  how to take this  when to take this   Pen Needles 32G X 4 MM Misc 1 each by Does not apply route daily.   Insulin Pen Needle 31G X 5 MM Misc Inject insulin via insulin pen 3 x daily with meals   piperacillin-tazobactam  IVPB Commonly known as: ZOSYN Inject 13.5 g into the vein daily for 7 days. As a continuous infusion. Indication:  Bacteremia First Dose:  Yes Last Day of Therapy:  04/23/20 Labs - Once weekly:  CBC/D and BMP, Labs - Every other week:  ESR and CRP Method of administration: Elastomeric (Continuous infusion) Method of administration may be changed at the discretion of home infusion pharmacist based upon assessment of the Shane [name] and/or caregiver's ability to self-administer the medication ordered.   pravastatin 20 MG tablet Commonly known as: PRAVACHOL TAKE 1 TABLET BY MOUTH EVERY DAY            Discharge Care Instructions  (From admission, onward)         Start     Ordered   04/17/20 0000  Change dressing on IV access line weekly and PRN  (Home infusion instructions - [facility] )        04/17/20 1126           Follow-up Information    Call [facility].   Why: As needed Contact information: [place]       [employer] [facility] [place]. Go on 04/23/2020.   Why:  Please attend your hospital follow up appt with Dr Anitra Lauth on Monday, 04/23/20 at 2:30pm. Contact information: [address] [place] [address] P: [place], [facility] Follow up.   Specialty: Home Health Services Why: Alvis Lemmings will contact you within 24-48 hours to arrange your first visit.  Contact information: [address] [place] [address] [phone]        [employer] .        [facility] Follow up.   Why: Carolynn Sayers will work with [facility] on your IV medication needs. Contact information: [address] [place], [place] [address] [phone]       Tammi Sou, MD Follow up in 1 week(s).   Specialty: Family Medicine Contact information: [address] [place]  [address] [phone]              Allergies  Allergen Reactions  . Other Swelling and Other (See Comments)    Pt states had swelling of the eye when having eye surgery and the anesthetic placed in eye- recovered the same day    Consultations: ID, urology and IR   Procedures/Studies: CT ABDOMEN PELVIS WO CONTRAST  Result Date: 04/15/2020 CLINICAL DATA:  Hydronephrosis. EXAM: CT ABDOMEN AND PELVIS WITHOUT CONTRAST TECHNIQUE: Multidetector CT imaging of the abdomen and pelvis was performed following the standard protocol without IV contrast. COMPARISON:  December 19, 2017.  April 11, 2020. FINDINGS: Lower chest: No acute abnormality. Hepatobiliary: No focal liver abnormality is seen. No gallstones, gallbladder wall thickening, or biliary dilatation. Pancreas: Pancreatic calcifications are noted consistent with chronic pancreatitis. Some degree of atrophy is present as well. No definite  acute abnormality is noted. Spleen: Normal in size without focal abnormality. Adrenals/Urinary Tract: Adrenal glands appear normal. Status post left percutaneous nephrostomy. Bilateral nephrolithiasis is noted. No hydronephrosis is noted. Status post cystectomy with ileal conduit seen in the right lower quadrant. Stomach/Bowel: Stomach is within normal limits. Appendix appears normal. No evidence of bowel wall thickening, distention, or inflammatory changes. Vascular/Lymphatic: Aortic atherosclerosis. No enlarged abdominal or pelvic lymph nodes. Reproductive: Status post prostatectomy. Other: No abdominal wall hernia or abnormality. No abdominopelvic ascites. Musculoskeletal: No acute or significant osseous findings. IMPRESSION: 1. Bilateral nephrolithiasis. No hydronephrosis is noted. Status post left percutaneous nephrostomy. 2. Status post cystectomy with ileal conduit seen in the right lower quadrant. 3. Pancreatic calcifications are noted consistent with chronic pancreatitis. 4. Aortic atherosclerosis. Aortic Atherosclerosis (ICD10-I70.0). Electronically Signed   By: Marijo Conception M.D.   On: 04/15/2020 10:21   CT Head Wo Contrast  Result Date: 04/08/2020 CLINICAL DATA:  Fall yesterday.  Fever and confusion today. EXAM: CT HEAD WITHOUT CONTRAST TECHNIQUE: Contiguous axial images were obtained from the base of the skull through the vertex without intravenous contrast. COMPARISON:  CT head 10/12/2017.  MRI brain 10/14/2017 FINDINGS: Brain: No evidence of acute infarction, hemorrhage, hydrocephalus, extra-axial collection or mass lesion/mass effect. Diffuse cerebral atrophy. Mild ventricular dilatation consistent with central atrophy. Low-attenuation changes in the deep white matter consistent small vessel ischemia. Parenchymal calcifications in the basal ganglia and cerebellum. Vascular: Intracranial arterial vascular calcifications are present. Skull: Depression and deformity of the outer table of the right  frontal sinus, new since previous study. This is consistent with a depression fracture. No significant overlying soft tissue swelling suggests this to represent an old rather than acute fracture. Calvarium appears otherwise intact. Sinuses/Orbits: Paranasal sinuses and mastoid air cells are  clear. Other: None. IMPRESSION: 1. No acute intracranial abnormalities. 2. Chronic atrophy and small vessel ischemic changes. 3. Depressed fracture deformity of the outer table of the right frontal sinus. No significant overlying soft tissue swelling and lack of fluid in the sinus suggests this to represent an old rather than acute fracture. Electronically Signed   By: Burman Nieves M.D.   On: 04/08/2020 23:33   MR ABDOMEN WO CONTRAST  Result Date: 04/12/2020 CLINICAL DATA:  Renal mass, renal insufficiency EXAM: MRI ABDOMEN WITHOUT CONTRAST TECHNIQUE: Multiplanar multisequence MR imaging was performed without the administration of intravenous contrast. COMPARISON:  CT of the abdomen and pelvis dated December 19, 2017 FINDINGS: Lower chest: No sign of consolidation or pleural effusion. Limited assessment of lung bases on MRI. Hepatobiliary: Limited assessment on noncontrast imaging of the liver. No visible lesion with suspicious features. No pericholecystic stranding or sign of biliary duct dilation. Pancreas: 13 mm lesion in the head of the pancreas with cystic features. Mild main duct distension grossly similar to previous imaging with ectatic side branches. The cystic lesion in the head of the pancreas is unchanged compared to Dec 04, 2018. Loss of normal intrinsic T1 hyperintensity within the pancreatic parenchyma is similar to previous imaging Spleen: Spleen normal in size and contour without focal lesion. Limited assessment without contrast. Adrenals/Urinary Tract:  Adrenal glands are grossly normal. Moderate LEFT-sided hydroureteronephrosis. In the LEFT renal pelvis is an area of low T2 signal measuring approximately 10 x  6 mm. Extensive inflammatory changes surround the LEFT renal pelvis. There is distension of LEFT renal collecting systems as described, some of this distension showing intermediate rather than high T2 signal without high T1 signal and with signs of profound restricted diffusion. Mixed signal on diffusion is noted with a more central area in the renal sinus measuring approximately 3.1 x 0.9 cm displaying less diffusion than material that fills the peripheral collecting systems. Striated parenchymal signal on T2 weighted imaging and on diffusion RIGHT kidney with potential calculus in the lower pole best seen a susceptibility on image 25 of series 12. This is nonspecific. Mild fullness of the RIGHT ureter without overt hydronephrosis in the setting of ileal conduit following cysto prostatectomy. Stomach/Bowel: Limited assessment of gastrointestinal structures. RIGHT lower quadrant ileal conduit ostomy. Small bowel, likely a portion of the conduit contained within a small ventral hernia is imaged only on coronal images, grossly unchanged accounting for incomplete visualization as compared to December 19, 2017. Study not performed for bowel evaluation and therefore with limited assessment of bowel. Vascular/Lymphatic: Vascular structures in the abdomen not well assessed with signs of atheromatous plaque. Mildly enlarged lymph nodes along the LEFT periaortic chain, largest 13 mm on image 66 of series 10. Other: Generalized mild body wall edema. No visible ascites. No perinephric collection. Musculoskeletal: No focal, suspicious bone lesion visualized. IMPRESSION: 1. Interval migration of a calculus (approximately 10 x 6 mm) from the peripheral collecting systems of the LEFT kidney into the renal pelvis with surrounding inflammation and associated moderate hydronephrosis. 2. Material filling calices and collecting system elements may represent tumor or infectious debris, urologic consultation with retrograde evaluation as  warranted is suggested. Noncontrast CT could be performed for confirmation of renal pelvic calculus if desired. 3. Striated appearance of parenchyma on the LEFT on T2 weighted imaging suggests concomitant pyelonephritis, combined with central obstructing calculus as described. 4. Mild fullness of the RIGHT ureter without overt hydronephrosis in the setting of ileal conduit following cysto prostatectomy. 5. Mildly enlarged LEFT  periaortic lymph nodes, nonspecific. 6. 13 mm cystic lesion in the head of the pancreas with cystic features. This is unchanged compared to previous imaging and may represent a side branch IPMN. Attention on follow-up. MRCP at 2 year interval could be performed as warranted for further assessment These results will be called to the ordering clinician or representative by the Radiologist Assistant, and communication documented in the PACS or [employer]. Electronically Signed   By: Zetta Bills M.D.   On: 04/12/2020 08:18   US RENAL  Result Date: 04/11/2020 CLINICAL DATA:  Acute kidney injury. EXAM: RENAL / URINARY TRACT ULTRASOUND COMPLETE COMPARISON:  Dec 04, 2018.  December 19, 2017 FINDINGS: Right Kidney: Renal measurements: 11.5 x 5.5 x 5.0 cm = volume: 166 mL. Mild hydronephrosis is noted. Echogenicity within normal limits. No mass visualized. Left Kidney: Renal measurements: 11.8 x 6.7 x 6.0 cm = volume: 248 mL. Echogenicity within normal limits. Mild hydronephrosis is noted. Lobulated echogenic abnormality is noted in the left renal pelvis. It is nonshadowing and measures approximately 2.5 x 3.0 cm. Bladder: Surgically removed due to cancer. Other: None. IMPRESSION: 1. Mild bilateral hydronephrosis is noted. 2. 3 cm lobular echogenic abnormality is noted in the left renal pelvis which is concerning for possible mass or malignancy. CT scan or MRI scan with and without contrast administration is recommended for further evaluation. Electronically Signed   By: Marijo Conception M.D.    On: 04/11/2020 13:21   DG Chest Port 1 View  Result Date: 04/08/2020 CLINICAL DATA:  Question sepsis.  Fever today, confusion. EXAM: PORTABLE CHEST 1 VIEW COMPARISON:  10/12/2017 FINDINGS: The cardiomediastinal contours are normal. Normal heart size. Aortic atherosclerosis. Subsegmental opacities at the left lung base. Pulmonary vasculature is normal. No pleural effusion or pneumothorax. No acute osseous abnormalities are seen. The bones are under mineralized. There is likely interposition of colon under the right hemidiaphragm. IMPRESSION: Subsegmental opacities at the left lung base, favor atelectasis or scarring. Airspace disease such as pneumonia is also considered in the setting of fever. Electronically Signed   By: Keith Rake M.D.   On: 04/08/2020 21:53   IR NEPHROSTOMY PLACEMENT LEFT  Result Date: 04/13/2020 INDICATION: 84 year old male with a history of obstructing left UPJ stone, referred for percutaneous drainage EXAM: IR NEPHROSTOMY PLACEMENT LEFT COMPARISON:  None. MEDICATIONS: Ciprofloxacin 400 mg IV; The antibiotic was administered in an appropriate time frame prior to skin puncture. ANESTHESIA/SEDATION: Fentanyl 75 mcg IV; Versed 1.0 mg IV Moderate Sedation Time:  13 minutes The Shane [name] was continuously monitored during the procedure by the interventional radiology nurse under my direct supervision. CONTRAST:  39mL OMNIPAQUE IOHEXOL 300 MG/ML SOLN - administered into the collecting system(s) FLUOROSCOPY TIME:  Fluoroscopy Time: 1 minutes 48 seconds (6 mGy). COMPLICATIONS: None PROCEDURE: Informed written consent was obtained from the Shane [name] after a thorough discussion of the procedural risks, benefits and alternatives. All questions were addressed. Maximal Sterile Barrier Technique was utilized including caps, mask, sterile gowns, sterile gloves, sterile drape, hand hygiene and skin antiseptic. A timeout was performed prior to the initiation of the procedure. Shane [name] positioned prone  position on the fluoroscopy table. Ultrasound survey of the left flank was performed with images stored and sent to PACs. The Shane [name] was then prepped and draped in the usual sterile fashion. 1% lidocaine was used to anesthetize the skin and subcutaneous tissues for local anesthesia. A Chiba needle was then used to access a posterior inferior calyx with ultrasound guidance. With spontaneous urine returned  through the needle, passage of an 018 micro wire into the collecting system was performed under fluoroscopy. A small incision was made with an 11 blade scalpel, and the needle was removed from the wire. An Accustick system was then advanced over the wire into the collecting system under fluoroscopy. The metal stiffener and inner dilator were removed, and then a sample of fluid was aspirated through the 4 French outer sheath. Bentson wire was passed into the collecting system and the sheath removed. Ten French dilation of the soft tissues was performed. Using modified Seldinger technique, a 10 French pigtail catheter drain was placed over the Bentson wire. Wire and inner stiffener removed, and the pigtail was formed in the collecting system. Small amount of contrast confirmed position of the catheter. Shane [name] tolerated the procedure well and remained hemodynamically stable throughout. No complications were encountered and no significant blood loss encountered FINDINGS: Frankly purulent urine.  Sample was sent IMPRESSION: Status post image guided placement of left percutaneous nephrostomy. Sample was sent for culture. Signed, Dulcy Fanny. Dellia Nims, RPVI [facility] Radiology Electronically Signed   By: Corrie Mckusick D.O.   On: 04/13/2020 10:49      Discharge Exam: Vitals:   04/16/20 2007 04/17/20 0735  BP: 114/65 122/73  Pulse: 63 (!) 58  Resp: 20 20  Temp: 98.4 F (36.9 C) 97.6 F (36.4 C)  SpO2: 98% 98%   Vitals:   04/16/20 1650 04/16/20 1723 04/16/20  2007 04/17/20 0735  BP:  107/66 114/65 122/73  Pulse: 65 65 63 (!) 58  Resp: $Remo'16 18 20 20  'TKNCP$ Temp: 98.4 F (36.9 C) 98.1 F (36.7 C) 98.4 F (36.9 C) 97.6 F (36.4 C)  TempSrc: Oral Oral Oral   SpO2: 97% 97% 98% 98%  Weight:      Height:        General: Pt is alert, awake, not in acute distress Cardiovascular: RRR, S1/S2 +, no rubs, no gallops Respiratory: CTA bilaterally, no wheezing, no rhonchi Abdominal: Soft, NT, ND, bowel sounds +, nephrostomy bag in place with clear urine.  Urostomy bag in place with clear urine. Extremities: no edema, no cyanosis    The results of significant diagnostics from this hospitalization (including imaging, microbiology, ancillary and laboratory) are listed below for reference.     Microbiology: Recent Results (from the past 240 hour(s))  Respiratory Panel by RT PCR (Flu A&B, Covid) - Nasopharyngeal Swab     Status: None   Collection Time: 04/08/20  9:00 PM   Specimen: Nasopharyngeal Swab  Result Value Ref Range Status   SARS Coronavirus 2 by RT PCR NEGATIVE NEGATIVE Final    Comment: (NOTE) SARS-CoV-2 target nucleic acids are NOT DETECTED.  The SARS-CoV-2 RNA is generally detectable in upper respiratoy specimens during the acute phase of infection. The lowest concentration of SARS-CoV-2 viral copies this assay can detect is 131 copies/mL. A negative result does not preclude SARS-Cov-2 infection and should not be used as the sole basis for treatment or other Shane [name] management decisions. A negative result may occur with  improper specimen collection/handling, submission of specimen other than nasopharyngeal swab, presence of viral mutation(s) within the areas targeted by this assay, and inadequate number of viral copies (<131 copies/mL). A negative result must be combined with clinical observations, Shane [name] history, and epidemiological information. The expected result is Negative.  Fact Sheet for Patients:   PinkCheek.be  Fact Sheet for Healthcare Providers:  GravelBags.it  This test is no t yet approved  or cleared by the [place] and  has been authorized for detection and/or diagnosis of SARS-CoV-2 by FDA under an Emergency Use Authorization (EUA). This EUA will remain  in effect (meaning this test can be used) for the duration of the COVID-19 declaration under Section 564(b)(1) of the Act, 21 U.S.C. section 360bbb-3(b)(1), unless the authorization is terminated or revoked sooner.     Influenza A by PCR NEGATIVE NEGATIVE Final   Influenza B by PCR NEGATIVE NEGATIVE Final    Comment: (NOTE) The Xpert Xpress SARS-CoV-2/FLU/RSV assay is intended as an aid in  the diagnosis of influenza from Nasopharyngeal swab specimens and  should not be used as a sole basis for treatment. Nasal washings and  aspirates are unacceptable for Xpert Xpress SARS-CoV-2/FLU/RSV  testing.  Fact Sheet for Patients: PinkCheek.be  Fact Sheet for Healthcare Providers: GravelBags.it  This test is not yet approved or cleared by the [place] FDA and  has been authorized for detection and/or diagnosis of SARS-CoV-2 by  FDA under an Emergency Use Authorization (EUA). This EUA will remain  in effect (meaning this test can be used) for the duration of the  Covid-19 declaration under Section 564(b)(1) of the Act, 21  U.S.C. section 360bbb-3(b)(1), unless the authorization is  terminated or revoked. Performed at [facility], [place] [address] [place], [place] [address]   Blood Culture (routine x 2)     Status: Abnormal   Collection Time: 04/08/20  9:25 PM   Specimen: BLOOD LEFT ARM  Result Value Ref Range Status   Specimen Description BLOOD LEFT ARM  Final   Special Requests   Final    BOTTLES DRAWN AEROBIC AND ANAEROBIC Blood Culture adequate volume   Culture  Setup Time   Final     GRAM POSITIVE COCCI IN CHAINS GRAM NEGATIVE RODS AEROBIC BOTTLE ONLY CRITICAL RESULT CALLED TO, READ BACK BY AND VERIFIED WITH: PHARMD F WILSON 04/09/20 AT 52 SK GRAM POSITIVE RODS GRAM NEGATIVE RODS ANAEROBIC BOTTLE ONLY CRITICAL RESULT CALLED TO, READ BACK BY AND VERIFIED WITHTillman Sers PHARMD 1700 04/10/20 A BROWNING    Culture (A)  Final    ENTEROCOCCUS FAECALIS PSEUDOMONAS AERUGINOSA PREVOTELLA MELANINOGENICA BETA LACTAMASE POSITIVE ACTINOTIGNUM SCHAALII Performed at [facility], [place] [address], [place], [place] [address]    Report Status 04/15/2020 FINAL  Final   Organism ID, Bacteria PSEUDOMONAS AERUGINOSA  Final   Organism ID, Bacteria ENTEROCOCCUS FAECALIS  Final      Susceptibility   Enterococcus faecalis - MIC*    AMPICILLIN <=2 SENSITIVE Sensitive     VANCOMYCIN 2 SENSITIVE Sensitive     GENTAMICIN SYNERGY SENSITIVE Sensitive     * ENTEROCOCCUS FAECALIS   Pseudomonas aeruginosa - MIC*    CEFTAZIDIME 4 SENSITIVE Sensitive     CIPROFLOXACIN >=4 RESISTANT Resistant     GENTAMICIN <=1 SENSITIVE Sensitive     IMIPENEM 2 SENSITIVE Sensitive     PIP/TAZO 8 SENSITIVE Sensitive     CEFEPIME 2 SENSITIVE Sensitive     * PSEUDOMONAS AERUGINOSA  Urine culture     Status: Abnormal   Collection Time: 04/08/20  9:25 PM   Specimen: Urine, Clean Catch  Result Value Ref Range Status   Specimen Description URINE, CLEAN CATCH  Final   Special Requests   Final    NONE Performed at [facility], [place] [address] [place], [place] [address]    Culture MULTIPLE SPECIES PRESENT, SUGGEST RECOLLECTION (A)  Final  Report Status 04/10/2020 FINAL  Final  Blood Culture ID Panel (Reflexed)     Status: Abnormal   Collection Time: 04/08/20  9:25 PM  Result Value Ref Range Status   Enterococcus faecalis DETECTED (A) NOT DETECTED Final    Comment: CRITICAL RESULT CALLED TO, READ BACK BY AND VERIFIED WITH: PHARMD F WILSON 04/09/20 AT 1724 SK    Enterococcus Faecium NOT  DETECTED NOT DETECTED Final   Listeria monocytogenes NOT DETECTED NOT DETECTED Final   Staphylococcus species NOT DETECTED NOT DETECTED Final   Staphylococcus aureus (BCID) NOT DETECTED NOT DETECTED Final   Staphylococcus epidermidis NOT DETECTED NOT DETECTED Final   Staphylococcus lugdunensis NOT DETECTED NOT DETECTED Final   Streptococcus species NOT DETECTED NOT DETECTED Final   Streptococcus agalactiae NOT DETECTED NOT DETECTED Final   Streptococcus pneumoniae NOT DETECTED NOT DETECTED Final   Streptococcus pyogenes NOT DETECTED NOT DETECTED Final   A.calcoaceticus-baumannii NOT DETECTED NOT DETECTED Final   Bacteroides fragilis NOT DETECTED NOT DETECTED Final   Enterobacterales NOT DETECTED NOT DETECTED Final   Enterobacter cloacae complex NOT DETECTED NOT DETECTED Final   Escherichia coli NOT DETECTED NOT DETECTED Final   Klebsiella aerogenes NOT DETECTED NOT DETECTED Final   Klebsiella oxytoca NOT DETECTED NOT DETECTED Final   Klebsiella pneumoniae NOT DETECTED NOT DETECTED Final   Proteus species NOT DETECTED NOT DETECTED Final   Salmonella species NOT DETECTED NOT DETECTED Final   Serratia marcescens NOT DETECTED NOT DETECTED Final   Haemophilus influenzae NOT DETECTED NOT DETECTED Final   Neisseria meningitidis NOT DETECTED NOT DETECTED Final   Pseudomonas aeruginosa DETECTED (A) NOT DETECTED Final    Comment: CRITICAL RESULT CALLED TO, READ BACK BY AND VERIFIED WITH: PHARMD F WILSON 04/09/20 AT 1724 SK    Stenotrophomonas maltophilia NOT DETECTED NOT DETECTED Final   Candida albicans NOT DETECTED NOT DETECTED Final   Candida auris NOT DETECTED NOT DETECTED Final   Candida glabrata NOT DETECTED NOT DETECTED Final   Candida krusei NOT DETECTED NOT DETECTED Final   Candida parapsilosis NOT DETECTED NOT DETECTED Final   Candida tropicalis NOT DETECTED NOT DETECTED Final   Cryptococcus neoformans/gattii NOT DETECTED NOT DETECTED Final   CTX-M ESBL NOT DETECTED NOT DETECTED  Final   Carbapenem resistance IMP NOT DETECTED NOT DETECTED Final   Carbapenem resistance KPC NOT DETECTED NOT DETECTED Final   Carbapenem resistance NDM NOT DETECTED NOT DETECTED Final   Vancomycin resistance NOT DETECTED NOT DETECTED Final   Carbapenem resistance VIM NOT DETECTED NOT DETECTED Final    Comment: Performed at [facility], [address], [place], [place] [address]  Blood Culture (routine x 2)     Status: None   Collection Time: 04/08/20  9:34 PM   Specimen: BLOOD RIGHT HAND  Result Value Ref Range Status   Specimen Description BLOOD RIGHT HAND  Final   Special Requests   Final    BOTTLES DRAWN AEROBIC AND ANAEROBIC Blood Culture results may not be optimal due to an inadequate volume of blood received in culture bottles   Culture   Final    NO GROWTH 5 DAYS Performed at [facility], [place] [address] [place], [place] [address]    Report Status 04/13/2020 FINAL  Final  Culture, blood (routine x 2)     Status: None   Collection Time: 04/11/20 11:50 AM   Specimen: BLOOD RIGHT HAND  Result Value Ref Range Status   Specimen Description BLOOD RIGHT HAND  Final  Special Requests   Final    BOTTLES DRAWN AEROBIC AND ANAEROBIC Blood Culture adequate volume   Culture   Final    NO GROWTH 5 DAYS Performed at [facility], [place] [address], [place], [place] [address]    Report Status 04/16/2020 FINAL  Final  Culture, blood (routine x 2)     Status: None   Collection Time: 04/11/20 12:00 PM   Specimen: BLOOD RIGHT ARM  Result Value Ref Range Status   Specimen Description BLOOD RIGHT ARM  Final   Special Requests   Final    BOTTLES DRAWN AEROBIC AND ANAEROBIC Blood Culture adequate volume   Culture   Final    NO GROWTH 5 DAYS Performed at [facility], [place] [address] [place], [place] [address]    Report Status 04/16/2020 FINAL  Final  Aerobic/Anaerobic Culture (surgical/deep wound)     Status: None (Preliminary result)   Collection Time: 04/13/20  10:00 AM   Specimen: Fluid; Urine  Result Value Ref Range Status   Specimen Description FLUID  Final   Special Requests LEFT KIDNEY  Final   Gram Stain   Final    ABUNDANT WBC PRESENT,BOTH PMN AND MONONUCLEAR ABUNDANT GRAM POSITIVE COCCI Performed at [facility], [place] [address], [place], [place] [address]    Culture   Final    ABUNDANT PSEUDOMONAS AERUGINOSA ABUNDANT ENTEROCOCCUS FAECALIS NO ANAEROBES ISOLATED; CULTURE IN PROGRESS FOR 5 DAYS    Report Status PENDING  Incomplete   Organism ID, Bacteria PSEUDOMONAS AERUGINOSA  Final   Organism ID, Bacteria ENTEROCOCCUS FAECALIS  Final      Susceptibility   Enterococcus faecalis - MIC*    AMPICILLIN <=2 SENSITIVE Sensitive     VANCOMYCIN 2 SENSITIVE Sensitive     GENTAMICIN SYNERGY SENSITIVE Sensitive     * ABUNDANT ENTEROCOCCUS FAECALIS   Pseudomonas aeruginosa - MIC*    CEFTAZIDIME 4 SENSITIVE Sensitive     CIPROFLOXACIN 2 INTERMEDIATE Intermediate     GENTAMICIN <=1 SENSITIVE Sensitive     IMIPENEM 2 SENSITIVE Sensitive     PIP/TAZO 8 SENSITIVE Sensitive     CEFEPIME 2 SENSITIVE Sensitive     * ABUNDANT PSEUDOMONAS AERUGINOSA     Labs: BNP (last 3 results) No results for input(s): BNP in the last 8760 hours. Basic Metabolic Panel: Recent Labs  Lab 04/11/20 0054 04/12/20 0601 04/13/20 0150 04/14/20 1055 04/15/20 0132 04/16/20 0331 04/17/20 0053  NA 135   < > 137 138 136 141 139  K 4.1   < > 4.4 3.9 4.4 4.1 4.1  CL 107   < > 105 105 105 107 106  CO2 16*   < > 22 21* 20* 24 23  GLUCOSE 125*   < > 98 147* 142* 132* 121*  BUN 42*   < > 38* 36* 34* 25* 25*  CREATININE 2.36*   < > 2.41* 2.58* 2.27* 2.17* 1.97*  CALCIUM 7.8*   < > 7.8* 8.0* 7.9* 8.2* 8.2*  MG 1.9  --   --  2.0  --   --   --    < > = values in this interval not displayed.   Liver Function Tests: No results for input(s): AST, ALT, ALKPHOS, BILITOT, PROT, ALBUMIN in the last 168 hours. No results for input(s): LIPASE, AMYLASE in the last 168  hours. No results for input(s): AMMONIA in the last 168 hours. CBC: Recent Labs  Lab 04/13/20 0150 04/14/20 0125 04/15/20 0132  04/16/20 0331 04/17/20 0053  WBC 8.4 19.3* 10.9* 8.0 6.9  NEUTROABS 5.9 16.7* 7.6 4.4 3.4  HGB 10.4* 9.2* 10.8* 10.6* 10.4*  HCT 32.3* 29.8* 34.1* 33.6* 33.8*  MCV 92.3 94.9 95.8 95.7 95.5  PLT 239 208 261 292 282   Cardiac Enzymes: No results for input(s): CKTOTAL, CKMB, CKMBINDEX, TROPONINI in the last 168 hours. BNP: Invalid input(s): POCBNP CBG: Recent Labs  Lab 04/16/20 0725 04/16/20 1205 04/16/20 1646 04/16/20 2057 04/17/20 0736  GLUCAP 126* 133* 142* 123* 108*   D-Dimer No results for input(s): DDIMER in the last 72 hours. Hgb A1c No results for input(s): HGBA1C in the last 72 hours. Lipid Profile No results for input(s): CHOL, HDL, LDLCALC, TRIG, CHOLHDL, LDLDIRECT in the last 72 hours. Thyroid function studies No results for input(s): TSH, T4TOTAL, T3FREE, THYROIDAB in the last 72 hours.  Invalid input(s): FREET3 Anemia work up No results for input(s): VITAMINB12, FOLATE, FERRITIN, TIBC, IRON, RETICCTPCT in the last 72 hours. Urinalysis    Component Value Date/Time   COLORURINE YELLOW 04/08/2020 2110   APPEARANCEUR CLOUDY (A) 04/08/2020 2110   LABSPEC 1.010 04/08/2020 2110   PHURINE 6.0 04/08/2020 2110   GLUCOSEU NEGATIVE 04/08/2020 2110   HGBUR MODERATE (A) 04/08/2020 2110   BILIRUBINUR NEGATIVE 04/08/2020 2110   BILIRUBINUR negative 04/14/2016 1456   KETONESUR NEGATIVE 04/08/2020 2110   PROTEINUR 30 (A) 04/08/2020 2110   UROBILINOGEN 0.2 04/14/2016 1456   NITRITE NEGATIVE 04/08/2020 2110   LEUKOCYTESUR LARGE (A) 04/08/2020 2110   Sepsis Labs Invalid input(s): PROCALCITONIN,  WBC,  LACTICIDVEN Microbiology Recent Results (from the past 240 hour(s))  Respiratory Panel by RT PCR (Flu A&B, Covid) - Nasopharyngeal Swab     Status: None   Collection Time: 04/08/20  9:00 PM   Specimen: Nasopharyngeal Swab  Result Value  Ref Range Status   SARS Coronavirus 2 by RT PCR NEGATIVE NEGATIVE Final    Comment: (NOTE) SARS-CoV-2 target nucleic acids are NOT DETECTED.  The SARS-CoV-2 RNA is generally detectable in upper respiratoy specimens during the acute phase of infection. The lowest concentration of SARS-CoV-2 viral copies this assay can detect is 131 copies/mL. A negative result does not preclude SARS-Cov-2 infection and should not be used as the sole basis for treatment or other Shane [name] management decisions. A negative result may occur with  improper specimen collection/handling, submission of specimen other than nasopharyngeal swab, presence of viral mutation(s) within the areas targeted by this assay, and inadequate number of viral copies (<131 copies/mL). A negative result must be combined with clinical observations, Shane [name] history, and epidemiological information. The expected result is Negative.  Fact Sheet for Patients:  https://www.moore.com/  Fact Sheet for Healthcare Providers:  https://www.young.biz/  This test is no t yet approved or cleared by the [place] FDA and  has been authorized for detection and/or diagnosis of SARS-CoV-2 by FDA under an Emergency Use Authorization (EUA). This EUA will remain  in effect (meaning this test can be used) for the duration of the COVID-19 declaration under Section 564(b)(1) of the Act, 21 U.S.C. section 360bbb-3(b)(1), unless the authorization is terminated or revoked sooner.     Influenza A by PCR NEGATIVE NEGATIVE Final   Influenza B by PCR NEGATIVE NEGATIVE Final    Comment: (NOTE) The Xpert Xpress SARS-CoV-2/FLU/RSV assay is intended as an aid in  the diagnosis of influenza from Nasopharyngeal swab specimens and  should not be used as a sole basis for treatment. Nasal washings and  aspirates are unacceptable for  Xpert Xpress SARS-CoV-2/FLU/RSV  testing.  Fact Sheet for  Patients: https://www.moore.com/  Fact Sheet for Healthcare Providers: https://www.young.biz/  This test is not yet approved or cleared by the [place] FDA and  has been authorized for detection and/or diagnosis of SARS-CoV-2 by  FDA under an Emergency Use Authorization (EUA). This EUA will remain  in effect (meaning this test can be used) for the duration of the  Covid-19 declaration under Section 564(b)(1) of the Act, 21  U.S.C. section 360bbb-3(b)(1), unless the authorization is  terminated or revoked. Performed at [facility], [address], [place], [place] [address]   Blood Culture (routine x 2)     Status: Abnormal   Collection Time: 04/08/20  9:25 PM   Specimen: BLOOD LEFT ARM  Result Value Ref Range Status   Specimen Description BLOOD LEFT ARM  Final   Special Requests   Final    BOTTLES DRAWN AEROBIC AND ANAEROBIC Blood Culture adequate volume   Culture  Setup Time   Final    GRAM POSITIVE COCCI IN CHAINS GRAM NEGATIVE RODS AEROBIC BOTTLE ONLY CRITICAL RESULT CALLED TO, READ BACK BY AND VERIFIED WITH: PHARMD F WILSON 04/09/20 AT 1724 SK GRAM POSITIVE RODS GRAM NEGATIVE RODS ANAEROBIC BOTTLE ONLY CRITICAL RESULT CALLED TO, READ BACK BY AND VERIFIED WITHKelby Fam PHARMD 1700 04/10/20 A BROWNING    Culture (A)  Final    ENTEROCOCCUS FAECALIS PSEUDOMONAS AERUGINOSA PREVOTELLA MELANINOGENICA BETA LACTAMASE POSITIVE ACTINOTIGNUM SCHAALII Performed at [facility], [address], [place], [place] [address]    Report Status 04/15/2020 FINAL  Final   Organism ID, Bacteria PSEUDOMONAS AERUGINOSA  Final   Organism ID, Bacteria ENTEROCOCCUS FAECALIS  Final      Susceptibility   Enterococcus faecalis - MIC*    AMPICILLIN <=2 SENSITIVE Sensitive     VANCOMYCIN 2 SENSITIVE Sensitive     GENTAMICIN SYNERGY SENSITIVE Sensitive     * ENTEROCOCCUS FAECALIS   Pseudomonas aeruginosa - MIC*    CEFTAZIDIME 4 SENSITIVE  Sensitive     CIPROFLOXACIN >=4 RESISTANT Resistant     GENTAMICIN <=1 SENSITIVE Sensitive     IMIPENEM 2 SENSITIVE Sensitive     PIP/TAZO 8 SENSITIVE Sensitive     CEFEPIME 2 SENSITIVE Sensitive     * PSEUDOMONAS AERUGINOSA  Urine culture     Status: Abnormal   Collection Time: 04/08/20  9:25 PM   Specimen: Urine, Clean Catch  Result Value Ref Range Status   Specimen Description URINE, CLEAN CATCH  Final   Special Requests   Final    NONE Performed at [facility], [address] [place], [place] [address]    Culture MULTIPLE SPECIES PRESENT, SUGGEST RECOLLECTION (A)  Final   Report Status 04/10/2020 FINAL  Final  Blood Culture ID Panel (Reflexed)     Status: Abnormal   Collection Time: 04/08/20  9:25 PM  Result Value Ref Range Status   Enterococcus faecalis DETECTED (A) NOT DETECTED Final    Comment: CRITICAL RESULT CALLED TO, READ BACK BY AND VERIFIED WITH: PHARMD F WILSON 04/09/20 AT 1724 SK    Enterococcus Faecium NOT DETECTED NOT DETECTED Final   Listeria monocytogenes NOT DETECTED NOT DETECTED Final   Staphylococcus species NOT DETECTED NOT DETECTED Final   Staphylococcus aureus (BCID) NOT DETECTED NOT DETECTED Final   Staphylococcus epidermidis NOT DETECTED NOT DETECTED Final   Staphylococcus lugdunensis NOT DETECTED NOT DETECTED Final   Streptococcus species NOT DETECTED NOT DETECTED Final  Streptococcus agalactiae NOT DETECTED NOT DETECTED Final   Streptococcus pneumoniae NOT DETECTED NOT DETECTED Final   Streptococcus pyogenes NOT DETECTED NOT DETECTED Final   A.calcoaceticus-baumannii NOT DETECTED NOT DETECTED Final   Bacteroides fragilis NOT DETECTED NOT DETECTED Final   Enterobacterales NOT DETECTED NOT DETECTED Final   Enterobacter cloacae complex NOT DETECTED NOT DETECTED Final   Escherichia coli NOT DETECTED NOT DETECTED Final   Klebsiella aerogenes NOT DETECTED NOT DETECTED Final   Klebsiella oxytoca NOT DETECTED NOT DETECTED Final   Klebsiella  pneumoniae NOT DETECTED NOT DETECTED Final   Proteus species NOT DETECTED NOT DETECTED Final   Salmonella species NOT DETECTED NOT DETECTED Final   Serratia marcescens NOT DETECTED NOT DETECTED Final   Haemophilus influenzae NOT DETECTED NOT DETECTED Final   Neisseria meningitidis NOT DETECTED NOT DETECTED Final   Pseudomonas aeruginosa DETECTED (A) NOT DETECTED Final    Comment: CRITICAL RESULT CALLED TO, READ BACK BY AND VERIFIED WITH: PHARMD F WILSON 04/09/20 AT 1724 SK    Stenotrophomonas maltophilia NOT DETECTED NOT DETECTED Final   Candida albicans NOT DETECTED NOT DETECTED Final   Candida auris NOT DETECTED NOT DETECTED Final   Candida glabrata NOT DETECTED NOT DETECTED Final   Candida krusei NOT DETECTED NOT DETECTED Final   Candida parapsilosis NOT DETECTED NOT DETECTED Final   Candida tropicalis NOT DETECTED NOT DETECTED Final   Cryptococcus neoformans/gattii NOT DETECTED NOT DETECTED Final   CTX-M ESBL NOT DETECTED NOT DETECTED Final   Carbapenem resistance IMP NOT DETECTED NOT DETECTED Final   Carbapenem resistance KPC NOT DETECTED NOT DETECTED Final   Carbapenem resistance NDM NOT DETECTED NOT DETECTED Final   Vancomycin resistance NOT DETECTED NOT DETECTED Final   Carbapenem resistance VIM NOT DETECTED NOT DETECTED Final    Comment: Performed at [facility], [address] [place], [place] [address]  Blood Culture (routine x 2)     Status: None   Collection Time: 04/08/20  9:34 PM   Specimen: BLOOD RIGHT HAND  Result Value Ref Range Status   Specimen Description BLOOD RIGHT HAND  Final   Special Requests   Final    BOTTLES DRAWN AEROBIC AND ANAEROBIC Blood Culture results may not be optimal due to an inadequate volume of blood received in culture bottles   Culture   Final    NO GROWTH 5 DAYS Performed at [facility], [address], [place], [place] [address]    Report Status 04/13/2020 FINAL  Final  Culture, blood (routine x 2)     Status: None    Collection Time: 04/11/20 11:50 AM   Specimen: BLOOD RIGHT HAND  Result Value Ref Range Status   Specimen Description BLOOD RIGHT HAND  Final   Special Requests   Final    BOTTLES DRAWN AEROBIC AND ANAEROBIC Blood Culture adequate volume   Culture   Final    NO GROWTH 5 DAYS Performed at [facility], [address], [place], [place] [address]    Report Status 04/16/2020 FINAL  Final  Culture, blood (routine x 2)     Status: None   Collection Time: 04/11/20 12:00 PM   Specimen: BLOOD RIGHT ARM  Result Value Ref Range Status   Specimen Description BLOOD RIGHT ARM  Final   Special Requests   Final    BOTTLES DRAWN AEROBIC AND ANAEROBIC Blood Culture adequate volume   Culture   Final    NO GROWTH 5 DAYS Performed at [facility], [place] [address] [place], [place] [address]    Report Status 04/16/2020 FINAL  Final  Aerobic/Anaerobic Culture (surgical/deep wound)     Status: None (Preliminary result)   Collection Time: 04/13/20 10:00 AM   Specimen: Fluid; Urine  Result Value Ref Range Status   Specimen Description FLUID  Final   Special Requests LEFT KIDNEY  Final   Gram Stain   Final    ABUNDANT WBC PRESENT,BOTH PMN AND MONONUCLEAR ABUNDANT GRAM POSITIVE COCCI Performed at [facility], [place] [address] [place],  [address]    Culture   Final    ABUNDANT PSEUDOMONAS AERUGINOSA ABUNDANT ENTEROCOCCUS FAECALIS NO ANAEROBES ISOLATED; CULTURE IN PROGRESS FOR 5 DAYS    Report Status PENDING  Incomplete   Organism ID, Bacteria PSEUDOMONAS AERUGINOSA  Final   Organism ID, Bacteria ENTEROCOCCUS FAECALIS  Final      Susceptibility   Enterococcus faecalis - MIC*    AMPICILLIN <=2 SENSITIVE Sensitive     VANCOMYCIN 2 SENSITIVE Sensitive     GENTAMICIN SYNERGY SENSITIVE Sensitive     * ABUNDANT ENTEROCOCCUS FAECALIS   Pseudomonas aeruginosa - MIC*    CEFTAZIDIME 4 SENSITIVE Sensitive     CIPROFLOXACIN 2 INTERMEDIATE Intermediate     GENTAMICIN <=1 SENSITIVE  Sensitive     IMIPENEM 2 SENSITIVE Sensitive     PIP/TAZO 8 SENSITIVE Sensitive     CEFEPIME 2 SENSITIVE Sensitive     * ABUNDANT PSEUDOMONAS AERUGINOSA     Time coordinating discharge: Over 30 minutes  SIGNED:   Darliss Cheney, MD  [facility] 04/17/2020, 11:26 AM  If 7PM-7AM, please contact night-coverage [email or web address]

## 2020-04-17 NOTE — TOC Transition Note (Signed)
Transition of Care [facility]) - CM/SW Discharge Note   Patient Details  Name: Shane [name] MRN: [id number] Date of Birth: September 14, 1935  Transition of Care [facility]) CM/SW Contact:  Joanne Chars, LCSW Phone Number: 04/17/2020, 1:00 PM   Clinical Narrative:   Pt discharging with [facility].  PCP hospital discharge appt made.  No other needs identified.     Final next level of care: [place] Barriers to Discharge: Barriers Resolved   Patient Goals and CMS Choice Patient states their goals for this hospitalization and ongoing recovery are:: stay mobile      Discharge Placement                       Discharge Plan and Services     Post Acute Care Choice:  (Pt declines post acute services today)          DME Arranged: N/A         HH Arranged: RN HH Agency: [place] Date [facility] Agency Contacted: 04/16/20 Time [place]: [id number] Representative spoke with at [place]: [place] Interventions     Readmission Risk Interventions Readmission Risk Prevention Plan 04/16/2020  Transportation Screening Complete  PCP or Specialist Appt within 3-5 Days Complete  HRI or [place] Patient refused  Social Work Consult for [place] Planning/Counseling Complete  Palliative Care Screening Not Applicable  Some recent data might be hidden

## 2020-04-17 NOTE — Care Management Important Message (Signed)
Important Message  Patient Details  Name: Shane [name] MRN: [id number] Date of Birth: [birth year]   Medicare Important Message Given:  Yes   IM Document  delivered on 04/17/2020   [name] 04/17/2020, 11:00 AM

## 2020-04-17 NOTE — Procedures (Signed)
Pre procedural Diagnosis: Poor venous access Post Procedural Diagnosis: Same  Successful placement of right IJ approach single lumen tunneled CVC with tip at the superior caval-atrial junction.    EBL: None  No immediate post procedural complication.  The CVC is ready for immediate use.  Jay Caidance Sybert, MD Pager #: [phone]   

## 2020-04-18 ENCOUNTER — Encounter: Payer: Self-pay | Admitting: Family Medicine

## 2020-04-18 ENCOUNTER — Telehealth: Payer: Self-pay

## 2020-04-18 DIAGNOSIS — Z452 Encounter for adjustment and management of vascular access device: Secondary | ICD-10-CM | POA: Diagnosis not present

## 2020-04-18 DIAGNOSIS — N1831 Chronic kidney disease, stage 3a: Secondary | ICD-10-CM | POA: Diagnosis not present

## 2020-04-18 DIAGNOSIS — D631 Anemia in chronic kidney disease: Secondary | ICD-10-CM | POA: Diagnosis not present

## 2020-04-18 DIAGNOSIS — I350 Nonrheumatic aortic (valve) stenosis: Secondary | ICD-10-CM | POA: Diagnosis not present

## 2020-04-18 DIAGNOSIS — E1122 Type 2 diabetes mellitus with diabetic chronic kidney disease: Secondary | ICD-10-CM | POA: Diagnosis not present

## 2020-04-18 DIAGNOSIS — I6782 Cerebral ischemia: Secondary | ICD-10-CM | POA: Diagnosis not present

## 2020-04-18 DIAGNOSIS — K862 Cyst of pancreas: Secondary | ICD-10-CM | POA: Diagnosis not present

## 2020-04-18 DIAGNOSIS — K269 Duodenal ulcer, unspecified as acute or chronic, without hemorrhage or perforation: Secondary | ICD-10-CM | POA: Diagnosis not present

## 2020-04-18 DIAGNOSIS — N136 Pyonephrosis: Secondary | ICD-10-CM | POA: Diagnosis not present

## 2020-04-18 DIAGNOSIS — K319 Disease of stomach and duodenum, unspecified: Secondary | ICD-10-CM | POA: Diagnosis not present

## 2020-04-18 DIAGNOSIS — N179 Acute kidney failure, unspecified: Secondary | ICD-10-CM | POA: Diagnosis not present

## 2020-04-18 DIAGNOSIS — I129 Hypertensive chronic kidney disease with stage 1 through stage 4 chronic kidney disease, or unspecified chronic kidney disease: Secondary | ICD-10-CM | POA: Diagnosis not present

## 2020-04-18 DIAGNOSIS — A4152 Sepsis due to Pseudomonas: Secondary | ICD-10-CM | POA: Diagnosis not present

## 2020-04-18 DIAGNOSIS — K861 Other chronic pancreatitis: Secondary | ICD-10-CM | POA: Diagnosis not present

## 2020-04-18 DIAGNOSIS — G510 Bell's palsy: Secondary | ICD-10-CM | POA: Diagnosis not present

## 2020-04-18 DIAGNOSIS — A4181 Sepsis due to Enterococcus: Secondary | ICD-10-CM | POA: Diagnosis not present

## 2020-04-18 DIAGNOSIS — Z436 Encounter for attention to other artificial openings of urinary tract: Secondary | ICD-10-CM | POA: Diagnosis not present

## 2020-04-18 DIAGNOSIS — I700 Atherosclerosis of aorta: Secondary | ICD-10-CM | POA: Diagnosis not present

## 2020-04-18 DIAGNOSIS — J181 Lobar pneumonia, unspecified organism: Secondary | ICD-10-CM | POA: Diagnosis not present

## 2020-04-18 DIAGNOSIS — I10 Essential (primary) hypertension: Secondary | ICD-10-CM | POA: Diagnosis not present

## 2020-04-18 DIAGNOSIS — Z792 Long term (current) use of antibiotics: Secondary | ICD-10-CM | POA: Diagnosis not present

## 2020-04-18 DIAGNOSIS — C679 Malignant neoplasm of bladder, unspecified: Secondary | ICD-10-CM | POA: Diagnosis not present

## 2020-04-18 DIAGNOSIS — Q6102 Congenital multiple renal cysts: Secondary | ICD-10-CM | POA: Diagnosis not present

## 2020-04-18 DIAGNOSIS — E872 Acidosis: Secondary | ICD-10-CM | POA: Diagnosis not present

## 2020-04-18 DIAGNOSIS — I2510 Atherosclerotic heart disease of native coronary artery without angina pectoris: Secondary | ICD-10-CM | POA: Diagnosis not present

## 2020-04-18 DIAGNOSIS — D509 Iron deficiency anemia, unspecified: Secondary | ICD-10-CM | POA: Diagnosis not present

## 2020-04-18 LAB — AEROBIC/ANAEROBIC CULTURE W GRAM STAIN (SURGICAL/DEEP WOUND)

## 2020-04-18 NOTE — Telephone Encounter (Signed)
1st TCM call attempted. No answer. Will try again tomorrow.

## 2020-04-19 ENCOUNTER — Other Ambulatory Visit: Payer: Self-pay | Admitting: Internal Medicine

## 2020-04-19 ENCOUNTER — Encounter (HOSPITAL_COMMUNITY): Payer: Self-pay

## 2020-04-19 ENCOUNTER — Telehealth: Payer: Self-pay

## 2020-04-19 DIAGNOSIS — R7881 Bacteremia: Secondary | ICD-10-CM | POA: Insufficient documentation

## 2020-04-19 NOTE — Telephone Encounter (Signed)
Noted  

## 2020-04-19 NOTE — Telephone Encounter (Signed)
2nd  attempt TCM call . Left message for patient to call back

## 2020-04-19 NOTE — Telephone Encounter (Signed)
Transition Care Management Unsuccessful Follow-up Telephone Call  Date of discharge and from where:  04/17/2020-[place]  Attempts:  3rd Attempt  Reason for unsuccessful TCM follow-up call:  Left voice message

## 2020-04-23 ENCOUNTER — Ambulatory Visit (INDEPENDENT_AMBULATORY_CARE_PROVIDER_SITE_OTHER): Payer: Medicare Other | Admitting: Family Medicine

## 2020-04-23 ENCOUNTER — Telehealth: Payer: Self-pay

## 2020-04-23 ENCOUNTER — Encounter: Payer: Self-pay | Admitting: Family Medicine

## 2020-04-23 ENCOUNTER — Other Ambulatory Visit: Payer: Self-pay

## 2020-04-23 VITALS — BP 120/68 | HR 70 | Temp 98.2°F | Resp 16 | Ht 67.0 in | Wt 186.4 lb

## 2020-04-23 DIAGNOSIS — N179 Acute kidney failure, unspecified: Secondary | ICD-10-CM | POA: Diagnosis not present

## 2020-04-23 DIAGNOSIS — N12 Tubulo-interstitial nephritis, not specified as acute or chronic: Secondary | ICD-10-CM

## 2020-04-23 DIAGNOSIS — C679 Malignant neoplasm of bladder, unspecified: Secondary | ICD-10-CM | POA: Diagnosis not present

## 2020-04-23 DIAGNOSIS — R6521 Severe sepsis with septic shock: Secondary | ICD-10-CM | POA: Diagnosis not present

## 2020-04-23 DIAGNOSIS — A4181 Sepsis due to Enterococcus: Secondary | ICD-10-CM | POA: Diagnosis not present

## 2020-04-23 DIAGNOSIS — A4152 Sepsis due to Pseudomonas: Secondary | ICD-10-CM | POA: Diagnosis not present

## 2020-04-23 DIAGNOSIS — N183 Chronic kidney disease, stage 3 unspecified: Secondary | ICD-10-CM | POA: Diagnosis not present

## 2020-04-23 DIAGNOSIS — Z436 Encounter for attention to other artificial openings of urinary tract: Secondary | ICD-10-CM | POA: Diagnosis not present

## 2020-04-23 DIAGNOSIS — A419 Sepsis, unspecified organism: Secondary | ICD-10-CM | POA: Diagnosis not present

## 2020-04-23 DIAGNOSIS — J181 Lobar pneumonia, unspecified organism: Secondary | ICD-10-CM | POA: Diagnosis not present

## 2020-04-23 DIAGNOSIS — N136 Pyonephrosis: Secondary | ICD-10-CM | POA: Diagnosis not present

## 2020-04-23 NOTE — Progress Notes (Signed)
04/23/2020  CC:  Chief Complaint  Patient presents with  . Hospitalization Follow-up    Patient is a 84 y.o. Caucasian male who presents for  hospital follow up, specifically Transitional Care Services face-to-face visit. Dates hospitalized: 9/26-10/5, 2021. Days since d/c from hospital: 6 days Patient was discharged from hospital to home. Reason for admission to hospital: generalized weakness, fever. Date of interactive (phone) contact with patient and/or caregiver:3 unsuccessful attempts 10/6-10/7.  I have reviewed patient's discharge summary plus pertinent specific notes, labs, and imaging from the hospitalization.    Pt found to have enterococcus and pseudomonas sepsis, ultimately found to have L obstructing renal stone, urine grew enterococcus and pseudomonas. PCN tube inserted and pt improved with abx. He was anxious to go home prior to finishing abx so it was arranged for him to complete 7 more days of IV zosyn at home.  States he feels great.  Eating well. Getting daily abx IV as ordered.  Last dose was today.  His diabetes is diet controlled.  Has insulin to use if needed (lantus and novolog). Using sliding scale, has tendency to drop in middle of the night so they are careful. Fasting: typically 80-90. Multiple checks throughout the day : never gets over 130.  F/u with Dr. Tresa Moore tomorrow. Medication reconciliation was done today and patient is taking meds as recommended by discharging hospitalist/specialist.    PMH:  Past Medical History:  Diagnosis Date  . Abnormal EKG 2014   This led to stress test which was abnl, which led to cardiology referral: cath showed small vessel dz of 2nd diag branch with 70-80% stenosis--preserved LV function  . Aortic atherosclerosis ([place])    w/out aneurism  . Bilateral renal cysts    Non-complex (CT 03/2016): no enhancement, coarse calcifications, mass effect does give false appearance of hydro on non-contract series (followed by  urol).  . Bladder cancer ([place]) 03/2016   Invasive high grade urothelial carcinoma.  No mets at dx.  Cystoscopy + bx and partial resection of tumor in hosp when admitted for gross hematuria and AUR.  Neoadj chemo 06/2016, then got cystoprostatectomy. (Urostomy--ileal conduit urinary diversion with refluxing anastamoses--has RLQ urostomy)-gets routine surveillance by urol (Dr. Tresa Moore), most recent 04/2017--no sign of recurrence--obs status  . CAD (coronary artery disease)    small vessel dz x 1 vessel on cath 2014: med mgmt  . Cerebellar cerebrovascular accident (CVA) without late effect 10/2017   Left, punctate: DAPT therapy x 3 wks per Dr. Leonie Man, neuro, then ASA 53m alone.  Stable as of 11/2017 f/u with neuro, Dr. XErlinda Hong  . [place] calcific [place]itis [place] 03/2016   +stable on f/u imaging 11/2018, + 2 small pseudocysts  . Chronic renal insufficiency, stage 2 (mild) 2019   Borderline II/III  . Diabetes mellitus without complication [place]    Insulin dependent; DKA admission 10/2017.  . GI bleed 12/2017   Presented with melena and Hb around 5.  Transfused 5 U pRBCs, started to get hematochezia in hosp: no clear explanation for GI bleeding was found on EGD or colonoscopy.  CT abd w/out acute abnormality.  Upper GI w/SBFT showed subtle duodenal erosion.  .Marland KitchenHeart murmur, systolic    Noted in prior PCP's records.  I recommended echocardiogram 07/2016 but pt declined.  .Marland KitchenHistory of Bell's palsy   . History of chemotherapy   . Hyperlipidemia   . Hypertension   . IDA (iron deficiency anemia) 12/2017   GI bleed  . Moderate aortic stenosis  Echo 03/2019. Pt asymptomatic.  Rpt 1 yr.   . Nephrolithiasis 03/2020   1 cm L renal pelvis stone-->hydronephrosis and pyelonephritis->sepsis (enterococcus and pseudomonas).  . Pancreas cyst 03/2016   CT abd/pelv 12/2017 for GI bleed w/u showed 1.6 cm pancreatic head cyst-->stable and likely benign.  Abd MRI 11/2018->stable pancreatic pseudocysts + chronic pancreatitis  changes. Rpt MRI abd w &w/out contrast 2 yrs (approx 11/2020).  . Sepsis ([place]) 03/2020   pseudomonas and enterococcus.  He had large L renal stone causing hydronephrosis->PCN was done.  [name] Umbilical hernia    Repaired when he got his bladder surgery.    PSH:  Past Surgical History:  Procedure Laterality Date  . BIOPSY  12/18/2017   Procedure: BIOPSY;  Surgeon: Doran Stabler, MD;  Location: [facility];  Service: Gastroenterology;;  . CARDIAC CATHETERIZATION  02/09/2013   small vessel dz of 2nd diag branch with 70-80% stenosis--preserved LV function. Medical therapy rec'd. Agustin Cree, PA)  . CARDIOVASCULAR STRESS TEST  01/26/2013   No ischemia.  Wall motion abnormalities at inferior base suggesting small area of infarction.  EF 64%.  . CAROTID ARTERY DOPPLERS  10/15/2017   1-39% stenosis R ICA, no stenosis L ICA.  [name] CATARACT EXTRACTION    . COLONOSCOPY N/A 12/19/2017   Clotted blood in cecum w/out any underlying abnormality found -->entired colon and terminal ilium normal.  Procedure: COLONOSCOPY;  Surgeon: Doran Stabler, MD;  Location: [facility];  Service: Gastroenterology;  Laterality: N/A;  . cystoprostatectomy w/LN surgery  10/17/2016   Ileal conduit: this is an incontinent urine diversion that directs urine from the ureters through a segment of isolated bowel to the surface of the abdominal wall via a cutaneous stoma, and urine drains continuously into an external ostomy appliance.  . CYSTOSCOPY WITH INJECTION N/A 10/17/2016   Procedure: CYSTOSCOPY WITH INJECTION OF INDOCYANINE GREEN DYE;  Surgeon: Alexis Frock, MD;  Location: [facility];  Service: Urology;  Laterality: N/A;  . ESOPHAGOGASTRODUODENOSCOPY N/A 12/18/2017   Mild chronic gastritis, no metaplasia or dysplasia.  H pylori pending as of 12/21/17.  nonbleeding duodenal ulcers.  Procedure: ESOPHAGOGASTRODUODENOSCOPY (EGD);  Surgeon: Doran Stabler, MD;  Location: [facility];  Service: Gastroenterology;  Laterality: N/A;   . EYE SURGERY     cataract surgery bilat   . IR FLUORO GUIDE CV LINE RIGHT  04/17/2020  . IR GENERIC HISTORICAL  05/12/2016   IR US GUIDE VASC ACCESS RIGHT 05/12/2016 Greggory Keen, MD [facility]  . IR GENERIC HISTORICAL  05/12/2016   IR FLUORO GUIDE PORT INSERTION RIGHT 05/12/2016 Greggory Keen, MD [facility]  . IR NEPHROSTOMY PLACEMENT LEFT  04/13/2020  . IR REMOVAL TUN ACCESS W/ PORT W/O FL MOD SED  07/03/2017  . IR US GUIDE VASC ACCESS RIGHT  04/17/2020  . OSTOMY     urostomy  . TONSILLECTOMY AND ADENOIDECTOMY  Remote  . TRANSTHORACIC ECHOCARDIOGRAM  10/16/2017; 03/2019   10/2017 EF 65-70%, normal wall motion, grd I DD.  03/2019 same but also new moderate aortic stenosis. Rpt echo 1 yr.  . TRANSURETHRAL RESECTION OF PROSTATE N/A 04/02/2016   Procedure: CYSTOSCOPY, TRANSURETHRAL RESECTION OF BLADDER TUMOR;  Surgeon: [name], MD;  Location[facility];  Service: Urology;  Laterality: N/A;  . UMBILICAL HERNIA REPAIR  10/2016  . VASECTOMY      MEDS:  Outpatient Medications Prior to Visit  Medication Sig Dispense Refill  . Continuous Blood Gluc Sensor (FREESTYLE LIBRE 14 DAY SENSOR) MISC USE AS DIRECTED (  Patient taking differently: Inject 1 patch into the skin every 14 (fourteen) days. ) 2 each 12  . Ferrous Sulfate 27 MG TABS Take 27 mg by mouth daily with breakfast.    . insulin aspart (NOVOLOG FLEXPEN) 100 UNIT/ML FlexPen CBG < 70: implement hypoglycemia protocol CBG 70 - 120: 3 units CBG 121 - 150: 5 units CBG 151 - 200: 6 units CBG 201 - 250: 8 units CBG 251 - 300: 11 units CBG 301 - 350: 14 units CBG 351 - 400: 18 units CBG > 400: call MD 15 mL 6  . Insulin Glargine (LANTUS SOLOSTAR) 100 UNIT/ML Solostar Pen INJECT 5 UNITS SUBQ AT BEDTIME (Patient taking differently: Inject 5 Units into the skin at bedtime. INJECT 5 UNITS SUBQ AT BEDTIME) 15 mL 3  . Insulin Pen Needle (PEN NEEDLES) 32G X 4 MM MISC 1 each by Does not apply route daily. 100 each 11  . Insulin Pen Needle  31G X 5 MM MISC Inject insulin via insulin pen 3 x daily with meals 100 each 3  . Lancets 30G MISC Use to check blood sugar 4 times daily 100 each 0  . Omega-3 Fatty Acids (FISH OIL PO) Take 1 capsule by mouth daily.     . piperacillin-tazobactam (ZOSYN) IVPB Inject 13.5 g into the vein daily for 7 days. As a continuous infusion. Indication:  Bacteremia First Dose:  Yes Last Day of Therapy:  04/23/20 Labs - Once weekly:  CBC/D and BMP, Labs - Every other week:  ESR and CRP Method of administration: Elastomeric (Continuous infusion) Method of administration may be changed at the discretion of home infusion pharmacist based upon assessment of the patient and/or caregiver's ability to self-administer the medication ordered. 7 Units 0  . pravastatin (PRAVACHOL) 20 MG tablet TAKE 1 TABLET BY MOUTH EVERY DAY (Patient taking differently: Take 20 mg by mouth daily. ) 30 tablet 0  . albuterol (VENTOLIN HFA) 108 (90 Base) MCG/ACT inhaler Inhale 2 puffs into the lungs every 6 (six) hours as needed. (Patient not taking: Reported on 04/23/2020) 18 g 0  . aspirin EC 81 MG tablet Take 1 tablet (81 mg total) by mouth daily. (Patient not taking: Reported on 04/08/2020) 30 tablet 0  . cetirizine (ZYRTEC) 10 MG tablet Take 1 tablet (10 mg total) by mouth daily. (Patient not taking: Reported on 04/23/2020) 30 tablet 11  . benzonatate (TESSALON) 100 MG capsule Take 1 capsule (100 mg total) by mouth 3 (three) times daily as needed for cough. (Patient not taking: Reported on 04/08/2020) 30 capsule 0  . FERROUS SULFATE PO Take 27 mg by mouth. Takes 1 daily (Patient not taking: Reported on 04/08/2020)     No facility-administered medications prior to visit.  EXAM: Gen: Alert, well appearing.  Patient is oriented to person, place, time, and situation. AFFECT: pleasant, lucid thought and speech. CV: RRR, no m/r/g.   LUNGS: CTA bilat, nonlabored resps, good aeration in all lung fields. ABD: soft, NT/ND EXT: no clubbing or  cyanosis.  no edema.    Pertinent labs/imaging   Chemistry      Component Value Date/Time   NA 139 04/17/2020 0053   NA 133 (A) 10/03/2017 0000   NA 139 06/18/2017 1125   K 4.1 04/17/2020 0053   K 4.5 06/18/2017 1125   CL 106 04/17/2020 0053   CO2 23 04/17/2020 0053   CO2 27 10/02/2017 0000   CO2 21 (L) 06/18/2017 1125   BUN 25 (H) 04/17/2020 0053  BUN 46 (A) 10/03/2017 0000   BUN 26.5 (H) 06/18/2017 1125   CREATININE 1.97 (H) 04/17/2020 0053   CREATININE 1.49 (H) 04/23/2018 1535   CREATININE 1.2 06/18/2017 1125   GLU 525 10/03/2017 0000      Component Value Date/Time   CALCIUM 8.2 (L) 04/17/2020 0053   CALCIUM 9.3 06/18/2017 1125   ALKPHOS 62 04/09/2020 0725   ALKPHOS 67 06/18/2017 1125   AST 16 04/09/2020 0725   AST 12 06/18/2017 1125   ALT 16 04/09/2020 0725   ALT 13 06/18/2017 1125   BILITOT 0.5 04/09/2020 0725   BILITOT 0.40 06/18/2017 1125     Lab Results  Component Value Date   WBC 6.9 04/17/2020   HGB 10.4 (L) 04/17/2020   HCT 33.8 (L) 04/17/2020   MCV 95.5 04/17/2020   PLT 282 04/17/2020   Lab Results  Component Value Date   HGBA1C 6.3 (H) 04/09/2020   ASSESSMENT/PLAN:  1) Pyelonephritis with sepsis secondary to obstructing L renal stone (pseudomonas and enterococcus).  He finished his zosyn at home today. Doing very well at this time.  HH did cbc today and we'll get results in 1-2d. HH PT briefly but not anymore-->he does the exercises daily. Has urology f/u tomorrow (Dr. Tresa Moore): Plan is to go into PCN tube/ureteral stent to break up and retrieve the stone.  2) DM, diet controlled. He and his wife follow glucoses closely and have insulin they use wisely and sparingly---but no insulin requirements at all lately.  3) AKI superimposed on CRI III: improved with IVF and insertion of PCN tube and ureteral stent in hosp. HH did CBC and BMET already--we'll get results from this in the next 1-2 days. Avoid NSAIDs and drink lots of fluids.  Plan repeat  CBC, CMET, A1c with me in 4 mo  Medical decision making of moderate complexity was utilized today.  FOLLOW UP:  4 mo, RCI  Signed:  Crissie Sickles, MD           04/23/2020

## 2020-04-23 NOTE — Telephone Encounter (Signed)
Spoke with Alma Downs, [facility] nurse who has already obtained labs today while at patient's home. Cbc and bmet collected. Will fax over results within the next 2-3 days. Labs not needed during appt today

## 2020-04-23 NOTE — Telephone Encounter (Signed)
Noted  

## 2020-04-24 DIAGNOSIS — N281 Cyst of kidney, acquired: Secondary | ICD-10-CM | POA: Diagnosis not present

## 2020-04-24 DIAGNOSIS — C672 Malignant neoplasm of lateral wall of bladder: Secondary | ICD-10-CM | POA: Diagnosis not present

## 2020-04-24 DIAGNOSIS — N184 Chronic kidney disease, stage 4 (severe): Secondary | ICD-10-CM | POA: Diagnosis not present

## 2020-04-24 DIAGNOSIS — N200 Calculus of kidney: Secondary | ICD-10-CM | POA: Diagnosis not present

## 2020-04-24 DIAGNOSIS — Z936 Other artificial openings of urinary tract status: Secondary | ICD-10-CM | POA: Diagnosis not present

## 2020-04-26 ENCOUNTER — Other Ambulatory Visit: Payer: Self-pay | Admitting: Urology

## 2020-04-26 ENCOUNTER — Telehealth: Payer: Self-pay

## 2020-04-26 DIAGNOSIS — A4181 Sepsis due to Enterococcus: Secondary | ICD-10-CM | POA: Diagnosis not present

## 2020-04-26 DIAGNOSIS — Z436 Encounter for attention to other artificial openings of urinary tract: Secondary | ICD-10-CM | POA: Diagnosis not present

## 2020-04-26 DIAGNOSIS — C679 Malignant neoplasm of bladder, unspecified: Secondary | ICD-10-CM | POA: Diagnosis not present

## 2020-04-26 DIAGNOSIS — J181 Lobar pneumonia, unspecified organism: Secondary | ICD-10-CM | POA: Diagnosis not present

## 2020-04-26 DIAGNOSIS — N136 Pyonephrosis: Secondary | ICD-10-CM | POA: Diagnosis not present

## 2020-04-26 DIAGNOSIS — A4152 Sepsis due to Pseudomonas: Secondary | ICD-10-CM | POA: Diagnosis not present

## 2020-04-26 NOTE — Telephone Encounter (Signed)
Left voicemail with IR to contact patient and schedule picc removal.  Aundria Rud, [place]

## 2020-04-26 NOTE — Telephone Encounter (Signed)
Received call from Agapito Games, Case manager with [facility] stating there was resistance when pulling picc today. Per note patient's picc is sutured and will need removal at IR. Updated case Freight forwarder. Left voicemail with patient requesting call back to schedule appointment at IR.

## 2020-04-27 ENCOUNTER — Telehealth (HOSPITAL_COMMUNITY): Payer: Self-pay | Admitting: Radiology

## 2020-04-27 NOTE — Telephone Encounter (Signed)
Spoke with Anderson Malta with IR and she states she has called the patient to scheduled him. Anderson Malta states she has not received a call back form the patient. [place] Dragone T Brooks Sailors

## 2020-04-27 NOTE — Telephone Encounter (Signed)
Called pt, left VM for him to call to schedule tunneled cath removal. JM

## 2020-04-30 NOTE — Telephone Encounter (Signed)
Left voicemail with patient and spouse x2 to schedule picc line removal. Unable to reach either at this time. Left voicemail with IR contact information. CMA will continue to follow. [place]

## 2020-05-01 ENCOUNTER — Other Ambulatory Visit: Payer: Self-pay | Admitting: Family Medicine

## 2020-05-01 ENCOUNTER — Telehealth: Payer: Self-pay

## 2020-05-01 NOTE — Telephone Encounter (Signed)
Home health orders received 04/30/20 for [place] health initiation orders: Yes.  Home health re-certification orders: No. Patient last seen by ordering physician for this condition: 04/23/20. Must be less than 90 days for re-certification and less than 30 days prior for initiation. Visit must have been for the condition the orders are being placed.  Patient meets criteria for Physician to sign orders: Yes.        Current med list has been attached: Yes        Orders placed on physicians desk for signature: 05/01/20 (date) If patient does not meet criteria for orders to be signed: pt was called to schedule appt. Appt is scheduled for n/a.   [place]

## 2020-05-02 DIAGNOSIS — C679 Malignant neoplasm of bladder, unspecified: Secondary | ICD-10-CM | POA: Diagnosis not present

## 2020-05-02 DIAGNOSIS — A4181 Sepsis due to Enterococcus: Secondary | ICD-10-CM | POA: Diagnosis not present

## 2020-05-02 DIAGNOSIS — Z436 Encounter for attention to other artificial openings of urinary tract: Secondary | ICD-10-CM | POA: Diagnosis not present

## 2020-05-02 DIAGNOSIS — N136 Pyonephrosis: Secondary | ICD-10-CM | POA: Diagnosis not present

## 2020-05-02 DIAGNOSIS — A4152 Sepsis due to Pseudomonas: Secondary | ICD-10-CM | POA: Diagnosis not present

## 2020-05-02 DIAGNOSIS — J181 Lobar pneumonia, unspecified organism: Secondary | ICD-10-CM | POA: Diagnosis not present

## 2020-05-03 ENCOUNTER — Other Ambulatory Visit: Payer: Self-pay

## 2020-05-03 ENCOUNTER — Ambulatory Visit (HOSPITAL_COMMUNITY)
Admission: RE | Admit: 2020-05-03 | Discharge: 2020-05-03 | Disposition: A | Discharge status: Home / Self Care | Payer: Medicare Other | Source: Ambulatory Visit | Attending: Internal Medicine | Admitting: Internal Medicine

## 2020-05-03 DIAGNOSIS — Z452 Encounter for adjustment and management of vascular access device: Secondary | ICD-10-CM | POA: Insufficient documentation

## 2020-05-03 DIAGNOSIS — R7881 Bacteremia: Secondary | ICD-10-CM | POA: Insufficient documentation

## 2020-05-03 HISTORY — PX: IR REMOVAL TUN CV CATH W/O FL: IMG2289

## 2020-05-03 NOTE — Procedures (Addendum)
Tunneled rt IJ CVC removed in its entirety without immediate complications. Gauze dressing applied to site afterwards. No medications used. EBL none.

## 2020-05-05 NOTE — Telephone Encounter (Signed)
Signed and put in box to go up front. Signed:  Phil Arieanna Pressey, MD           05/05/2020  

## 2020-05-07 NOTE — Patient Instructions (Addendum)
DUE TO COVID-19 ONLY ONE VISITOR IS ALLOWED TO COME WITH YOU AND STAY IN THE WAITING ROOM ONLY DURING PRE OP AND PROCEDURE DAY OF SURGERY. THE 1 VISITOR  MAY VISIT WITH YOU AFTER SURGERY IN YOUR PRIVATE ROOM DURING VISITING HOURS ONLY!  YOU NEED TO HAVE A COVID 19 TEST ON_11/2______ @_12 :00 pm______, THIS TEST MUST BE DONE BEFORE SURGERY,  COVID TESTING SITE [place] [address], IT IS ON THE RIGHT GOING OUT [address] APPROXIMATELY  2 MINUTES PAST [employer] ON THE RIGHT. ONCE YOUR COVID TEST IS COMPLETED,  PLEASE BEGIN THE QUARANTINE INSTRUCTIONS AS OUTLINED IN YOUR HANDOUT.                Shane [name]    Your procedure is scheduled on: 05/18/20   Report to [facility] Main  Entrance   Report to radiology at 8:00 AM     Call this number if you have problems the morning of surgery [phone]    Remember: Do not eat food or drink liquids :After Midnight.   BRUSH YOUR TEETH MORNING OF SURGERY AND RINSE YOUR MOUTH OUT, NO CHEWING GUM CANDY OR MINTS.     Take these medicines the morning of surgery with A SIP OF WATER: None  How to Manage Your Diabetes Before and After Surgery  Why is it important to control my blood sugar before and after surgery? . Improving blood sugar levels before and after surgery helps healing and can limit problems. . A way of improving blood sugar control is eating a healthy diet by: o  Eating less sugar and carbohydrates o  Increasing activity/exercise o  Talking with your doctor about reaching your blood sugar goals . High blood sugars (greater than 180 mg/dL) can raise your risk of infections and slow your recovery, so you will need to focus on controlling your diabetes during the weeks before surgery. . Make sure that the doctor who takes care of your diabetes knows about your planned surgery including the date and location.  How do I manage my blood sugar before surgery? . Check your blood sugar at  least 4 times a day, starting 2 days before surgery, to make sure that the level is not too high or low. o Check your blood sugar the morning of your surgery when you wake up and every 2 hours until you get to the Short Stay unit. . If your blood sugar is less than 70 mg/dL, you will need to treat for low blood sugar: o Do not take insulin. o Treat a low blood sugar (less than 70 mg/dL) with  cup of clear juice (cranberry or apple), 4 glucose tablets, OR glucose gel. o Recheck blood sugar in 15 minutes after treatment (to make sure it is greater than 70 mg/dL). If your blood sugar is not greater than 70 mg/dL on recheck, call [phone] for further instructions. . Report your blood sugar to the short stay nurse when you get to Short Stay.  . If you are admitted to the hospital after surgery: o Your blood sugar will be checked by the staff and you will probably be given insulin after surgery (instead of oral diabetes medicines) to make sure you have good blood sugar levels. o The goal for blood sugar control after surgery is 80-180 mg/dL.   WHAT DO I DO ABOUT MY DIABETES MEDICATION?  [name] Do not take oral diabetes medicines (pills) the morning of surgery.  [name] THE  NIGHT BEFORE SURGERY, take  2.5     units of   Glargine insulin.       . THE MORNING OF SURGERY, take 2.5  units of Glargine    insulin.  . The day of surgery, do not take other diabetes injectables, including Byetta (exenatide), Bydureon (exenatide ER), Victoza (liraglutide), or Trulicity (dulaglutide).  . If your CBG is greater than 220 mg/dL, you may take  of your sliding scale  . (correction) dose of insulin.                                  You may not have any metal on your body including              piercings  Do not wear jewelry,  lotions, powders or deodorant              Men may shave face and neck.   Do not bring valuables to the hospital. [place].  Contacts,  dentures or bridgework may not be worn into surgery.                   Please read over the following fact sheets you were given: _____________________________________________________________________             [facility] - Preparing for Surgery Before surgery, you can play an important role.  Because skin is not sterile, your skin needs to be as free of germs as possible.  You can reduce the number of germs on your skin by washing with CHG (chlorahexidine gluconate) soap before surgery.  CHG is an antiseptic cleaner which kills germs and bonds with the skin to continue killing germs even after washing. Please DO NOT use if you have an allergy to CHG or antibacterial soaps.  If your skin becomes reddened/irritated stop using the CHG and inform your nurse when you arrive at Short Stay.   You may shave your face/neck. Please follow these instructions carefully:  1.  Shower with CHG Soap the night before surgery and the  morning of Surgery.  2.  If you choose to wash your hair, wash your hair first as usual with your  normal  shampoo.  3.  After you shampoo, rinse your hair and body thoroughly to remove the  shampoo.                                        4.  Use CHG as you would any other liquid soap.  You can apply chg directly  to the skin and wash                       Gently with a scrungie or clean washcloth.  5.  Apply the CHG Soap to your body ONLY FROM THE NECK DOWN.   Do not use on face/ open                           Wound or open sores. Avoid contact with eyes, ears mouth and genitals (private parts).  Wash face,  Genitals (private parts) with your normal soap.             6.  Wash thoroughly, paying special attention to the area where your surgery  will be performed.  7.  Thoroughly rinse your body with warm water from the neck down.  8.  DO NOT shower/wash with your normal soap after using and rinsing off  the CHG Soap.             9.  Pat yourself dry with  a clean towel.            10.  Wear clean pajamas.            11.  Place clean sheets on your bed the night of your first shower and do not  sleep with pets. Day of Surgery : Do not apply any lotions/deodorants the morning of surgery.  Please wear clean clothes to the hospital/surgery center.  FAILURE TO FOLLOW THESE INSTRUCTIONS MAY RESULT IN THE CANCELLATION OF YOUR SURGERY PATIENT SIGNATURE_________________________________  NURSE SIGNATURE__________________________________  ________________________________________________________________________

## 2020-05-08 ENCOUNTER — Encounter (HOSPITAL_COMMUNITY)
Admission: RE | Admit: 2020-05-08 | Discharge: 2020-05-08 | Disposition: A | Discharge status: Home / Self Care | Payer: Medicare Other | Source: Ambulatory Visit | Attending: Urology | Admitting: Urology

## 2020-05-08 ENCOUNTER — Telehealth: Payer: Self-pay

## 2020-05-08 ENCOUNTER — Other Ambulatory Visit: Payer: Self-pay

## 2020-05-08 ENCOUNTER — Encounter (HOSPITAL_COMMUNITY): Payer: Self-pay

## 2020-05-08 DIAGNOSIS — Z8673 Personal history of transient ischemic attack (TIA), and cerebral infarction without residual deficits: Secondary | ICD-10-CM | POA: Insufficient documentation

## 2020-05-08 DIAGNOSIS — I2510 Atherosclerotic heart disease of native coronary artery without angina pectoris: Secondary | ICD-10-CM | POA: Insufficient documentation

## 2020-05-08 DIAGNOSIS — Z79899 Other long term (current) drug therapy: Secondary | ICD-10-CM | POA: Insufficient documentation

## 2020-05-08 DIAGNOSIS — N182 Chronic kidney disease, stage 2 (mild): Secondary | ICD-10-CM | POA: Insufficient documentation

## 2020-05-08 DIAGNOSIS — I129 Hypertensive chronic kidney disease with stage 1 through stage 4 chronic kidney disease, or unspecified chronic kidney disease: Secondary | ICD-10-CM | POA: Insufficient documentation

## 2020-05-08 DIAGNOSIS — N136 Pyonephrosis: Secondary | ICD-10-CM | POA: Diagnosis not present

## 2020-05-08 DIAGNOSIS — J90 Pleural effusion, not elsewhere classified: Secondary | ICD-10-CM | POA: Diagnosis not present

## 2020-05-08 DIAGNOSIS — N390 Urinary tract infection, site not specified: Secondary | ICD-10-CM | POA: Diagnosis not present

## 2020-05-08 DIAGNOSIS — Z794 Long term (current) use of insulin: Secondary | ICD-10-CM | POA: Insufficient documentation

## 2020-05-08 DIAGNOSIS — Z7982 Long term (current) use of aspirin: Secondary | ICD-10-CM | POA: Insufficient documentation

## 2020-05-08 DIAGNOSIS — E785 Hyperlipidemia, unspecified: Secondary | ICD-10-CM | POA: Diagnosis not present

## 2020-05-08 DIAGNOSIS — Z8551 Personal history of malignant neoplasm of bladder: Secondary | ICD-10-CM | POA: Diagnosis not present

## 2020-05-08 DIAGNOSIS — Z01812 Encounter for preprocedural laboratory examination: Secondary | ICD-10-CM | POA: Insufficient documentation

## 2020-05-08 DIAGNOSIS — R059 Cough, unspecified: Secondary | ICD-10-CM | POA: Diagnosis not present

## 2020-05-08 DIAGNOSIS — R509 Fever, unspecified: Secondary | ICD-10-CM | POA: Diagnosis not present

## 2020-05-08 DIAGNOSIS — D509 Iron deficiency anemia, unspecified: Secondary | ICD-10-CM | POA: Insufficient documentation

## 2020-05-08 DIAGNOSIS — N200 Calculus of kidney: Secondary | ICD-10-CM | POA: Diagnosis not present

## 2020-05-08 DIAGNOSIS — N12 Tubulo-interstitial nephritis, not specified as acute or chronic: Secondary | ICD-10-CM | POA: Diagnosis not present

## 2020-05-08 DIAGNOSIS — E1122 Type 2 diabetes mellitus with diabetic chronic kidney disease: Secondary | ICD-10-CM | POA: Insufficient documentation

## 2020-05-08 DIAGNOSIS — A419 Sepsis, unspecified organism: Secondary | ICD-10-CM | POA: Diagnosis not present

## 2020-05-08 DIAGNOSIS — A4152 Sepsis due to Pseudomonas: Secondary | ICD-10-CM | POA: Diagnosis not present

## 2020-05-08 DIAGNOSIS — Z8616 Personal history of COVID-19: Secondary | ICD-10-CM | POA: Diagnosis not present

## 2020-05-08 HISTORY — DX: Personal history of urinary calculi: Z87.442

## 2020-05-08 HISTORY — DX: Pneumonia, unspecified organism: J18.9

## 2020-05-08 LAB — BASIC METABOLIC PANEL
Anion gap: 10 (ref 5–15)
BUN: 41 mg/dL — ABNORMAL HIGH (ref 8–23)
CO2: 22 mmol/L (ref 22–32)
Calcium: 9.1 mg/dL (ref 8.9–10.3)
Chloride: 108 mmol/L (ref 98–111)
Creatinine, Ser: 1.67 mg/dL — ABNORMAL HIGH (ref 0.61–1.24)
GFR, Estimated: 41 mL/min — ABNORMAL LOW (ref >60)
Glucose, Bld: 105 mg/dL — ABNORMAL HIGH (ref 70–99)
Potassium: 4.9 mmol/L (ref 3.5–5.1)
Sodium: 140 mmol/L (ref 135–145)

## 2020-05-08 LAB — CBC
HCT: 39.8 % (ref 39.0–52.0)
Hemoglobin: 12.4 g/dL — ABNORMAL LOW (ref 13.0–17.0)
MCH: 30 pg (ref 26.0–34.0)
MCHC: 31.2 g/dL (ref 30.0–36.0)
MCV: 96.4 fL (ref 80.0–100.0)
Platelets: 201 10*3/uL (ref 150–400)
RBC: 4.13 MIL/uL — ABNORMAL LOW (ref 4.22–5.81)
RDW: 14 % (ref 11.5–15.5)
WBC: 8.6 10*3/uL (ref 4.0–10.5)
nRBC: 0 % (ref 0.0–0.2)

## 2020-05-08 LAB — GLUCOSE, CAPILLARY: Glucose-Capillary: 119 mg/dL — ABNORMAL HIGH (ref 70–99)

## 2020-05-08 NOTE — Progress Notes (Signed)
COVID Vaccine Completed:Yes Date COVID Vaccine completed:10/26/19 COVID vaccine manufacturer: [place]       PCP - Dr. Elijah Birk Cardiologist - none  Chest x-ray - 04/08/20-Epic EKG - 04/17/20-Epic Stress Test - 2014 ECHO - 04/04/20-Epic Cardiac Cath - 2014 Pacemaker/ICD device last checked:NA  Sleep Study - no CPAP -   Fasting Blood Sugar - 68-90 Checks Blood Sugar _____ times a day continuous  Blood Thinner Instructions:NA Aspirin Instructions: Last Dose:  Anesthesia review:   Patient denies shortness of breath, fever, cough and chest pain at PAT appointment yes   Patient verbalized understanding of instructions that were given to them at the PAT appointment. Patient was also instructed that they will need to review over the PAT instructions again at home before surgery. Yes Pt reports that he has no SOB climbing stairs, working or with ADLs His CBGs are kept in good range with strict diet and sometimes doesn't need insulin

## 2020-05-08 NOTE — Telephone Encounter (Signed)
Home health orders received 05/08/20 for [facility] initiation orders: Yes.  Home health re-certification orders: No. Patient last seen by ordering physician for this condition: 04/23/20. Must be less than 90 days for re-certification and less than 30 days prior for initiation. Visit must have been for the condition the orders are being placed.  Patient meets criteria for Physician to sign orders: Yes.        Current med list has been attached: Yes        Orders placed on physicians desk for signature: 05/08/20 (date) If patient does not meet criteria for orders to be signed: pt was called to schedule appt. Appt is scheduled for 08/24/20.   Emilee Hero

## 2020-05-08 NOTE — Progress Notes (Signed)
Anesthesia Chart Review:   Case: [id number] Date/Time: 05/18/20 0915   Procedures:      NEPHROLITHOTOMY PERCUTANEOUS (Left ) - 2 HRS     ANTEGRADE URETEROSCOPY WITH STENT PLACEMENT (Left )     HOLMIUM LASER APPLICATION (Left )   Anesthesia type: General   Pre-op diagnosis: LEFT RENAL STONE, HISTORY OF CYSTECTOMY   Location: [place] [facility]   Surgeons: Alexis Frock, MD      DISCUSSION:  Pt is an 84 year old with hx CAD (cath in 2014: 70-80% D2--> medical management), aortic stenosis (moderate by 04/05/19 echo), HTN, DM, CVA, IDA, CKD, bladder cancer  - Hospitalized 9/26-10/5/21 for sepsis, AKI due to pyelonephritis and L renal pelvis stone - Discharge summary documents pt has had 4 episodes of pneumonia this year  VS: BP 130/67   Pulse 60   Temp 37 C (Oral)   Resp 16   Ht 5\' 7"  (1.702 m)   Wt 83.2 kg   SpO2 100%   BMI 28.72 kg/m    PROVIDERS: - PCP is McGowen, [name], MD - No cardiologist. PCP follows aortic stenosis   LABS: Labs reviewed: Acceptable for surgery.  - Cr 1.67 is improved from recent prior results 1.97-2.58 (recent AKI due to pyelonephritis and sepsis due to obstructing renal stone)  (all labs ordered are listed, but only abnormal results are displayed)  Labs Reviewed  BASIC METABOLIC PANEL - Abnormal; Notable for the following components:      Result Value   Glucose, Bld 105 (*)    BUN 41 (*)    Creatinine, Ser 1.67 (*)    GFR, Estimated 41 (*)    All other components within normal limits  CBC - Abnormal; Notable for the following components:   RBC 4.13 (*)    Hemoglobin 12.4 (*)    All other components within normal limits  GLUCOSE, CAPILLARY - Abnormal; Notable for the following components:   Glucose-Capillary 119 (*)    All other components within normal limits     IMAGES: 1 view CXR 04/08/20:  - Subsegmental opacities at the left lung base, favor atelectasis or scarring. -  - Airspace disease such as pneumonia is also  considered in the setting of fever.   EKG 04/08/20: Sinus rhythm. LAD, consider left anterior fascicular block. Borderline T wave abnormalities. Prolonged QT interval   CV: Echo 04/05/19:  1. Left ventricular ejection fraction, by visual estimation, is 65 to 70%. The left ventricle has hyperdynamic function. Normal left ventricular size. There is mildly increased left ventricular hypertrophy.  2. Left ventricular diastolic Doppler parameters are consistent with impaired relaxation pattern of LV diastolic filling.  3. Global right ventricle has normal systolic function.The right ventricular size is normal. No increase in right ventricular wall thickness.  4. Left atrial size was mildly dilated.  5. Right atrial size was normal.  6. Moderate calcification of the mitral valve leaflet(s).  7. Moderate thickening of the mitral valve leaflet(s).  8. The mitral valve is normal in structure. Mild mitral valve regurgitation. No evidence of mitral stenosis.  9. The tricuspid valve is normal in structure. Tricuspid valve regurgitation is mild.  10. Aortic valve mean gradient measures 17.0 mmHg.  11. The aortic valve in severely thickened and calcified. Aortic valve regurgitation was not visualized by color flow Doppler. Moderate aortic valve stenosis.  12. The pulmonic valve was normal in structure. Pulmonic valve regurgitation is not visualized by color flow Doppler.  13. Normal pulmonary  artery systolic pressure.  14. The inferior vena cava is normal in size with greater than 50% respiratory variability, suggesting right atrial pressure of 3 mmHg.    Carotid duplex 10/15/17:  - Right Carotid: Velocities in the right ICA are consistent with a 1-39% stenosis.  - Left Carotid: There is no evidence of stenosis in the left ICA.  - Vertebrals: Bilateral vertebral arteries demonstrate antegrade flow.  - Subclavians: Normal flow hemodynamics were seen in bilateral subclavian arteries.   Cardiac  cath 02/09/13 (by Silver Huguenin, MD in [place], [place]; found in "AMB correspondence" 07/23/16 in media tab on page 16).  - LAD: proximal 20-30%; D2 proximal 70-80% - LM, CX, RCA all free of significant disease   Past Medical History:  Diagnosis Date  . Abnormal EKG 2014   This led to stress test which was abnl, which led to cardiology referral: cath showed small vessel dz of 2nd diag branch with 70-80% stenosis--preserved LV function  . Aortic atherosclerosis ([place])    w/out aneurism  . Bilateral renal cysts    Non-complex (CT 03/2016): no enhancement, coarse calcifications, mass effect does give false appearance of hydro on non-contract series (followed by urol).  . Bladder cancer ([place]) 03/2016   Invasive high grade urothelial carcinoma.  No mets at dx.  Cystoscopy + bx and partial resection of tumor in hosp when admitted for gross hematuria and AUR.  Neoadj chemo 06/2016, then got cystoprostatectomy. (Urostomy--ileal conduit urinary diversion with refluxing anastamoses--has RLQ urostomy)-gets routine surveillance by urol (Dr. Tresa Moore), most recent 04/2017--no sign of recurrence--obs status  . CAD (coronary artery disease)    small vessel dz x 1 vessel on cath 2014: med mgmt  . Cerebellar cerebrovascular accident (CVA) without late effect 10/2017   Left, punctate: DAPT therapy x 3 wks per Dr. Leonie Man, neuro, then ASA 81mg  alone.  Stable as of 11/2017 f/u with neuro, Dr. Erlinda Hong.  . Chronic calcific pancreatitis ([place]) 03/2016   +stable on f/u imaging 11/2018, + 2 small pseudocysts  . Chronic renal insufficiency, stage 2 (mild) 2019   Borderline II/III  . Diabetes mellitus without complication ([place])    Insulin dependent; DKA admission 10/2017.  . GI bleed 12/2017   Presented with melena and Hb around 5.  Transfused 5 U pRBCs, started to get hematochezia in hosp: no clear explanation for GI bleeding was found on EGD or colonoscopy.  CT abd w/out acute abnormality.  Upper GI w/SBFT showed subtle duodenal  erosion.  [name] Heart murmur, systolic    Noted in prior PCP's records.  I recommended echocardiogram 07/2016 but pt declined.  [name] History of Bell's palsy   . History of chemotherapy   . History of kidney stones   . Hyperlipidemia   . Hypertension   . IDA (iron deficiency anemia) 12/2017   GI bleed  . Moderate aortic stenosis    Echo 03/2019. Pt asymptomatic.  Rpt 1 yr.   . Nephrolithiasis 03/2020   1 cm L renal pelvis stone-->hydronephrosis and pyelonephritis->sepsis (enterococcus and pseudomonas).  . Pancreas cyst 03/2016   CT abd/pelv 12/2017 for GI bleed w/u showed 1.6 cm pancreatic head cyst-->stable and likely benign.  Abd MRI 11/2018->stable pancreatic pseudocysts + chronic pancreatitis changes. Rpt MRI abd w &w/out contrast 2 yrs (approx 11/2020).  . Pneumonia    with Covid  . Sepsis ([place]) 03/2020   pseudomonas and enterococcus.  He had large L renal stone causing hydronephrosis->PCN was done.  [name] Umbilical hernia    Repaired when he  got his bladder surgery.    Past Surgical History:  Procedure Laterality Date  . BIOPSY  12/18/2017   Procedure: BIOPSY;  Surgeon: Doran Stabler, MD;  Location: [facility];  Service: Gastroenterology;;  . CARDIAC CATHETERIZATION  02/09/2013   small vessel dz of 2nd diag branch with 70-80% stenosis--preserved LV function. Medical therapy rec'd. Agustin Cree, PA)  . CARDIOVASCULAR STRESS TEST  01/26/2013   No ischemia.  Wall motion abnormalities at inferior base suggesting small area of infarction.  EF 64%.  . CAROTID ARTERY DOPPLERS  10/15/2017   1-39% stenosis R ICA, no stenosis L ICA.  [name] CATARACT EXTRACTION    . COLONOSCOPY N/A 12/19/2017   Clotted blood in cecum w/out any underlying abnormality found -->entired colon and terminal ilium normal.  Procedure: COLONOSCOPY;  Surgeon: Doran Stabler, MD;  Location: [facility];  Service: Gastroenterology;  Laterality: N/A;  . cystoprostatectomy w/LN surgery  10/17/2016   Ileal conduit: this is an  incontinent urine diversion that directs urine from the ureters through a segment of isolated bowel to the surface of the abdominal wall via a cutaneous stoma, and urine drains continuously into an external ostomy appliance.  . CYSTOSCOPY WITH INJECTION N/A 10/17/2016   Procedure: CYSTOSCOPY WITH INJECTION OF INDOCYANINE GREEN DYE;  Surgeon: Alexis Frock, MD;  Location: [facility];  Service: Urology;  Laterality: N/A;  . ESOPHAGOGASTRODUODENOSCOPY N/A 12/18/2017   Mild chronic gastritis, no metaplasia or dysplasia.  H pylori pending as of 12/21/17.  nonbleeding duodenal ulcers.  Procedure: ESOPHAGOGASTRODUODENOSCOPY (EGD);  Surgeon: Doran Stabler, MD;  Location: [facility];  Service: Gastroenterology;  Laterality: N/A;  . EYE SURGERY     cataract surgery bilat   . IR FLUORO GUIDE CV LINE RIGHT  04/17/2020  . IR GENERIC HISTORICAL  05/12/2016   IR US GUIDE VASC ACCESS RIGHT 05/12/2016 Greggory Keen, MD [facility]  . IR GENERIC HISTORICAL  05/12/2016   IR FLUORO GUIDE PORT INSERTION RIGHT 05/12/2016 Greggory Keen, MD [facility]  . IR NEPHROSTOMY PLACEMENT LEFT  04/13/2020  . IR REMOVAL TUN ACCESS W/ PORT W/O FL MOD SED  07/03/2017  . IR REMOVAL TUN CV CATH W/O FL  05/03/2020  . IR US GUIDE VASC ACCESS RIGHT  04/17/2020  . OSTOMY     urostomy  . TONSILLECTOMY AND ADENOIDECTOMY  Remote  . TRANSTHORACIC ECHOCARDIOGRAM  10/16/2017; 03/2019   10/2017 EF 65-70%, normal wall motion, grd I DD.  03/2019 same but also new moderate aortic stenosis. Rpt echo 1 yr.  . TRANSURETHRAL RESECTION OF PROSTATE N/A 04/02/2016   Procedure: CYSTOSCOPY, TRANSURETHRAL RESECTION OF BLADDER TUMOR;  Surgeon: [name], MD;  Location: [facility];  Service: Urology;  Laterality: N/A;  . UMBILICAL HERNIA REPAIR  10/2016  . VASECTOMY      MEDICATIONS: . albuterol (VENTOLIN HFA) 108 (90 Base) MCG/ACT inhaler  . aspirin EC 81 MG tablet  . cetirizine (ZYRTEC) 10 MG tablet  . Continuous Blood Gluc Sensor (FREESTYLE LIBRE  14 DAY SENSOR) MISC  . Ferrous Sulfate 27 MG TABS  . insulin aspart (NOVOLOG FLEXPEN) 100 UNIT/ML FlexPen  . Insulin Glargine (LANTUS SOLOSTAR) 100 UNIT/ML Solostar Pen  . Insulin Pen Needle (PEN NEEDLES) 32G X 4 MM MISC  . Insulin Pen Needle 31G X 5 MM MISC  . Lancets 30G MISC  . Omega-3 Fatty Acids (FISH OIL PO)  . pravastatin (PRAVACHOL) 20 MG tablet   No current facility-administered medications for this encounter.    If no  changes, I anticipate pt can proceed with surgery as scheduled.   Willeen Cass, PhD, FNP-BC [facility]/Anesthesiology Phone: [phone] 05/08/2020 4:45 PM

## 2020-05-08 NOTE — Anesthesia Preprocedure Evaluation (Addendum)
Anesthesia Evaluation  Patient identified by MRN, date of birth, ID band Patient awake    Reviewed: Allergy & Precautions, NPO status , Patient's Chart, lab work & pertinent test results  Airway Mallampati: I  TM Distance: >3 FB Neck ROM: Full    Dental  (+) Edentulous Upper, Edentulous Lower   Pulmonary neg pulmonary ROS, former smoker,    Pulmonary exam normal breath sounds clear to auscultation       Cardiovascular hypertension, Pt. on medications + CAD  Normal cardiovascular exam+ Valvular Problems/Murmurs (moderate AS) AS  Rhythm:Regular Rate:Normal  EKG 04/08/20: Sinus rhythm. LAD, consider left anterior fascicular block. Borderline T wave abnormalities. Prolonged QT interval  CV: Echo 04/05/19:  1. Left ventricular ejection fraction, by visual estimation, is 65 to 70%. The left ventricle has hyperdynamic function. Normal left ventricular size. There is mildly increased left ventricular hypertrophy.  2. Left ventricular diastolic Doppler parameters are consistent with impaired relaxation pattern of LV diastolic filling.  3. Global right ventricle has normal systolic function.The right ventricular size is normal. No increase in right ventricular wall thickness.  4. Left atrial size was mildly dilated.  5. Right atrial size was normal.  6. Moderate calcification of the mitral valve leaflet(s).  7. Moderate thickening of the mitral valve leaflet(s).  8. The mitral valve is normal in structure. Mild mitral valve regurgitation. No evidence of mitral stenosis.  9. The tricuspid valve is normal in structure. Tricuspid valve regurgitation is mild.  10. Aortic valve mean gradient measures 17.0 mmHg.  11. The aortic valve in severely thickened and calcified. Aortic valve regurgitation was not visualized by color flow Doppler. Moderate aortic valve stenosis.  12. The pulmonic valve was normal in structure. Pulmonic valve  regurgitation is not visualized by color flow Doppler.  13. Normal pulmonary artery systolic pressure.  14. The inferior vena cava is normal in size with greater than 50% respiratory variability, suggesting right atrial pressure of 3 mmHg.   Carotid duplex 10/15/17:  - Right Carotid: Velocities in the right ICA are consistent with a 1-39% stenosis.  - Left Carotid: There is no evidence of stenosis in the left ICA.  - Vertebrals: Bilateral vertebral arteries demonstrate antegrade flow.  - Subclavians: Normal flow hemodynamics were seen in bilateral subclavian arteries.   Cardiac cath 02/09/13 (by Silver Huguenin, MD in [place], [place]; found in "AMB correspondence" 07/23/16 in media tab on page 16).  - LAD: proximal 20-30%; D2 proximal 70-80% - LM, CX, RCA all free of significant disease   Neuro/Psych CVA, No Residual Symptoms negative psych ROS   GI/Hepatic Neg liver ROS, PUD,   Endo/Other  negative endocrine ROSdiabetes (diet controlled), Well Controlled, Type 2  Renal/GU Renal InsufficiencyRenal disease (Cr 1.81, K4)  negative genitourinary   Musculoskeletal negative musculoskeletal ROS (+)   Abdominal   Peds  Hematology negative hematology ROS (+)   Anesthesia Other Findings   Reproductive/Obstetrics                           Anesthesia Physical Anesthesia Plan  ASA: III  Anesthesia Plan: General   Post-op Pain Management:    Induction: Intravenous  PONV Risk Score and Plan: 2 and Dexamethasone, Ondansetron and Treatment may vary due to age or medical condition  Airway Management Planned: Oral ETT  Additional Equipment:   Intra-op Plan:   Post-operative Plan: Extubation in OR  Informed Consent: I have reviewed the patients History and Physical, chart, labs and  discussed the procedure including the risks, benefits and alternatives for the proposed anesthesia with the patient or authorized representative who has indicated his/her  understanding and acceptance.     Dental advisory given  Plan Discussed with: CRNA  Anesthesia Plan Comments:       Anesthesia Quick Evaluation

## 2020-05-09 ENCOUNTER — Encounter (HOSPITAL_COMMUNITY): Payer: Self-pay | Admitting: *Deleted

## 2020-05-09 ENCOUNTER — Emergency Department (HOSPITAL_COMMUNITY): Payer: Medicare Other

## 2020-05-09 ENCOUNTER — Inpatient Hospital Stay (HOSPITAL_COMMUNITY)
Admission: EM | Admit: 2020-05-09 | Discharge: 2020-05-11 | DRG: 872 | Disposition: A | Discharge status: Home Health | Payer: Medicare Other | Attending: Internal Medicine | Admitting: Internal Medicine

## 2020-05-09 ENCOUNTER — Observation Stay (HOSPITAL_COMMUNITY): Payer: Medicare Other

## 2020-05-09 ENCOUNTER — Other Ambulatory Visit: Payer: Self-pay

## 2020-05-09 DIAGNOSIS — N136 Pyonephrosis: Secondary | ICD-10-CM | POA: Diagnosis not present

## 2020-05-09 DIAGNOSIS — Z79899 Other long term (current) drug therapy: Secondary | ICD-10-CM

## 2020-05-09 DIAGNOSIS — E1122 Type 2 diabetes mellitus with diabetic chronic kidney disease: Secondary | ICD-10-CM | POA: Diagnosis present

## 2020-05-09 DIAGNOSIS — Z794 Long term (current) use of insulin: Secondary | ICD-10-CM

## 2020-05-09 DIAGNOSIS — Z7982 Long term (current) use of aspirin: Secondary | ICD-10-CM

## 2020-05-09 DIAGNOSIS — R059 Cough, unspecified: Secondary | ICD-10-CM | POA: Diagnosis not present

## 2020-05-09 DIAGNOSIS — Z8551 Personal history of malignant neoplasm of bladder: Secondary | ICD-10-CM

## 2020-05-09 DIAGNOSIS — N1831 Chronic kidney disease, stage 3a: Secondary | ICD-10-CM | POA: Diagnosis present

## 2020-05-09 DIAGNOSIS — E785 Hyperlipidemia, unspecified: Secondary | ICD-10-CM | POA: Diagnosis present

## 2020-05-09 DIAGNOSIS — A419 Sepsis, unspecified organism: Secondary | ICD-10-CM | POA: Diagnosis not present

## 2020-05-09 DIAGNOSIS — K8689 Other specified diseases of pancreas: Secondary | ICD-10-CM | POA: Diagnosis not present

## 2020-05-09 DIAGNOSIS — N12 Tubulo-interstitial nephritis, not specified as acute or chronic: Secondary | ICD-10-CM

## 2020-05-09 DIAGNOSIS — N200 Calculus of kidney: Secondary | ICD-10-CM | POA: Diagnosis not present

## 2020-05-09 DIAGNOSIS — Z8673 Personal history of transient ischemic attack (TIA), and cerebral infarction without residual deficits: Secondary | ICD-10-CM

## 2020-05-09 DIAGNOSIS — Z8616 Personal history of COVID-19: Secondary | ICD-10-CM

## 2020-05-09 DIAGNOSIS — J181 Lobar pneumonia, unspecified organism: Secondary | ICD-10-CM | POA: Diagnosis not present

## 2020-05-09 DIAGNOSIS — A4181 Sepsis due to Enterococcus: Secondary | ICD-10-CM | POA: Diagnosis not present

## 2020-05-09 DIAGNOSIS — R509 Fever, unspecified: Secondary | ICD-10-CM | POA: Diagnosis not present

## 2020-05-09 DIAGNOSIS — A4152 Sepsis due to Pseudomonas: Principal | ICD-10-CM | POA: Diagnosis present

## 2020-05-09 DIAGNOSIS — J90 Pleural effusion, not elsewhere classified: Secondary | ICD-10-CM | POA: Diagnosis not present

## 2020-05-09 DIAGNOSIS — Z9221 Personal history of antineoplastic chemotherapy: Secondary | ICD-10-CM

## 2020-05-09 DIAGNOSIS — Z9079 Acquired absence of other genital organ(s): Secondary | ICD-10-CM

## 2020-05-09 DIAGNOSIS — N390 Urinary tract infection, site not specified: Secondary | ICD-10-CM | POA: Diagnosis not present

## 2020-05-09 DIAGNOSIS — I2510 Atherosclerotic heart disease of native coronary artery without angina pectoris: Secondary | ICD-10-CM | POA: Diagnosis present

## 2020-05-09 DIAGNOSIS — Z936 Other artificial openings of urinary tract status: Secondary | ICD-10-CM

## 2020-05-09 DIAGNOSIS — Z436 Encounter for attention to other artificial openings of urinary tract: Secondary | ICD-10-CM | POA: Diagnosis not present

## 2020-05-09 DIAGNOSIS — C679 Malignant neoplasm of bladder, unspecified: Secondary | ICD-10-CM | POA: Diagnosis not present

## 2020-05-09 DIAGNOSIS — I129 Hypertensive chronic kidney disease with stage 1 through stage 4 chronic kidney disease, or unspecified chronic kidney disease: Secondary | ICD-10-CM | POA: Diagnosis present

## 2020-05-09 DIAGNOSIS — Z87891 Personal history of nicotine dependence: Secondary | ICD-10-CM

## 2020-05-09 DIAGNOSIS — R531 Weakness: Secondary | ICD-10-CM | POA: Diagnosis not present

## 2020-05-09 LAB — COMPREHENSIVE METABOLIC PANEL
ALT: 16 U/L (ref 0–44)
AST: 16 U/L (ref 15–41)
Albumin: 3.6 g/dL (ref 3.5–5.0)
Alkaline Phosphatase: 74 U/L (ref 38–126)
Anion gap: 14 (ref 5–15)
BUN: 35 mg/dL — ABNORMAL HIGH (ref 8–23)
CO2: 18 mmol/L — ABNORMAL LOW (ref 22–32)
Calcium: 8.4 mg/dL — ABNORMAL LOW (ref 8.9–10.3)
Chloride: 105 mmol/L (ref 98–111)
Creatinine, Ser: 1.76 mg/dL — ABNORMAL HIGH (ref 0.61–1.24)
GFR, Estimated: 38 mL/min — ABNORMAL LOW (ref >60)
Glucose, Bld: 199 mg/dL — ABNORMAL HIGH (ref 70–99)
Potassium: 3.8 mmol/L (ref 3.5–5.1)
Sodium: 137 mmol/L (ref 135–145)
Total Bilirubin: 0.6 mg/dL (ref 0.3–1.2)
Total Protein: 7.9 g/dL (ref 6.5–8.1)

## 2020-05-09 LAB — CBC WITH DIFFERENTIAL/PLATELET
Abs Immature Granulocytes: 0.05 10*3/uL (ref 0.00–0.07)
Basophils Absolute: 0.1 10*3/uL (ref 0.0–0.1)
Basophils Relative: 1 %
Eosinophils Absolute: 0 10*3/uL (ref 0.0–0.5)
Eosinophils Relative: 0 %
HCT: 40.2 % (ref 39.0–52.0)
Hemoglobin: 12.9 g/dL — ABNORMAL LOW (ref 13.0–17.0)
Immature Granulocytes: 0 %
Lymphocytes Relative: 8 %
Lymphs Abs: 1 10*3/uL (ref 0.7–4.0)
MCH: 30.8 pg (ref 26.0–34.0)
MCHC: 32.1 g/dL (ref 30.0–36.0)
MCV: 95.9 fL (ref 80.0–100.0)
Monocytes Absolute: 0.8 10*3/uL (ref 0.1–1.0)
Monocytes Relative: 7 %
Neutro Abs: 10.5 10*3/uL — ABNORMAL HIGH (ref 1.7–7.7)
Neutrophils Relative %: 84 %
Platelets: 201 10*3/uL (ref 150–400)
RBC: 4.19 MIL/uL — ABNORMAL LOW (ref 4.22–5.81)
RDW: 13.8 % (ref 11.5–15.5)
WBC: 12.4 10*3/uL — ABNORMAL HIGH (ref 4.0–10.5)
nRBC: 0 % (ref 0.0–0.2)

## 2020-05-09 LAB — LACTIC ACID, PLASMA
Lactic Acid, Venous: 2.1 mmol/L (ref 0.5–1.9)
Lactic Acid, Venous: 2 mmol/L (ref 0.5–1.9)

## 2020-05-09 LAB — URINALYSIS, ROUTINE W REFLEX MICROSCOPIC
Bilirubin Urine: NEGATIVE
Glucose, UA: NEGATIVE mg/dL
Ketones, ur: NEGATIVE mg/dL
Nitrite: POSITIVE — AB
Protein, ur: 100 mg/dL — AB
Specific Gravity, Urine: 1.008 (ref 1.005–1.030)
WBC, UA: >50 WBC/hpf — ABNORMAL HIGH (ref 0–5)
pH: 6 (ref 5.0–8.0)

## 2020-05-09 LAB — RESPIRATORY PANEL BY RT PCR (FLU A&B, COVID)
Influenza A by PCR: NEGATIVE
Influenza B by PCR: NEGATIVE
SARS Coronavirus 2 by RT PCR: NEGATIVE

## 2020-05-09 LAB — GLUCOSE, CAPILLARY: Glucose-Capillary: 200 mg/dL — ABNORMAL HIGH (ref 70–99)

## 2020-05-09 MED ORDER — PRAVASTATIN SODIUM 20 MG PO TABS
20.0000 mg | ORAL_TABLET | Freq: Every day | ORAL | Status: DC
  Administered 2020-05-10 – 2020-05-11 (×2): 20 mg via ORAL
  Filled 2020-05-09 (×2): qty 1

## 2020-05-09 MED ORDER — FERROUS SULFATE 325 (65 FE) MG PO TABS
325.0000 mg | ORAL_TABLET | Freq: Every day | ORAL | Status: DC
  Administered 2020-05-10 – 2020-05-11 (×2): 325 mg via ORAL
  Filled 2020-05-09 (×2): qty 1

## 2020-05-09 MED ORDER — ALBUTEROL SULFATE HFA 108 (90 BASE) MCG/ACT IN AERS: 2.0000 | INHALATION_SPRAY | Freq: Four times a day (QID) | RESPIRATORY_TRACT | Status: DC | PRN

## 2020-05-09 MED ORDER — ACETAMINOPHEN 650 MG RE SUPP: 650.0000 mg | Freq: Four times a day (QID) | RECTAL | Status: DC | PRN

## 2020-05-09 MED ORDER — ONDANSETRON HCL 4 MG/2ML IJ SOLN: 4.0000 mg | Freq: Four times a day (QID) | INTRAMUSCULAR | Status: DC | PRN

## 2020-05-09 MED ORDER — IOHEXOL 300 MG/ML  SOLN
75.0000 mL | Freq: Once | INTRAMUSCULAR | Status: AC | PRN
  Administered 2020-05-09: 75 mL via INTRAVENOUS

## 2020-05-09 MED ORDER — ACETAMINOPHEN 325 MG PO TABS: 650.0000 mg | ORAL_TABLET | Freq: Four times a day (QID) | ORAL | Status: DC | PRN

## 2020-05-09 MED ORDER — SODIUM CHLORIDE 0.9 % IV SOLN
1.0000 g | Freq: Two times a day (BID) | INTRAVENOUS | Status: DC
  Administered 2020-05-09 – 2020-05-11 (×4): 1 g via INTRAVENOUS
  Filled 2020-05-09 (×4): qty 1

## 2020-05-09 MED ORDER — ONDANSETRON HCL 4 MG PO TABS: 4.0000 mg | ORAL_TABLET | Freq: Four times a day (QID) | ORAL | Status: DC | PRN

## 2020-05-09 MED ORDER — INSULIN ASPART 100 UNIT/ML ~~LOC~~ SOLN
0.0000 [IU] | Freq: Three times a day (TID) | SUBCUTANEOUS | Status: DC
  Administered 2020-05-10 – 2020-05-11 (×3): 1 [IU] via SUBCUTANEOUS
  Administered 2020-05-11: 2 [IU] via SUBCUTANEOUS
  Filled 2020-05-09: qty 0.09

## 2020-05-09 NOTE — H&P (Signed)
Urology Consult  Referring physician: N pickering Reason for referral: UTI sepsis  Chief Complaint: UTI sepsis  History of Present Illness: Patient well-known to Dr. Tresa Moore.  He has had a cystectomy and ileal conduit and chemotherapy.  Urethra still in situ.  He is known to have a 6 mm lower pole stone dating back to 2019.  Recently it enlarged to 1 cm and a left percutaneous tube was placed.  He was last seen October 12 and had noted functional decline over 2 years.  No sign of cancer recurrence.  He scheduled a percutaneous procedure electively for 1 cm stone.  He is known to have chronic renal insufficiency and likely intrinsic renal disease.  He came in today with generalized weakness and was being admitted for urinary tract infection.  Temperature at home was 102.  Percutaneous tube draining but it became cloudy.  3 weeks ago he was discharged home on Zosyn, had mildly elevated white blood count and lactic acid and was started on IV antibiotics and I recommend a CT scan to rule out any complicating factors  White blood count 12.4.  Serum creatinine 1.76 which is approximately his baseline and it it has been as high as 2.58  Septic protocol and culture sent.  CT scan pending  Past Medical History:  Diagnosis Date  . Abnormal EKG 2014   This led to stress test which was abnl, which led to cardiology referral: cath showed small vessel dz of 2nd diag branch with 70-80% stenosis--preserved LV function  . Aortic atherosclerosis ([place])    w/out aneurism  . Bilateral renal cysts    Non-complex (CT 03/2016): no enhancement, coarse calcifications, mass effect does give false appearance of hydro on non-contract series (followed by urol).  . Bladder cancer ([place]) 03/2016   Invasive high grade urothelial carcinoma.  No mets at dx.  Cystoscopy + bx and partial resection of tumor in hosp when admitted for gross hematuria and AUR.  Neoadj chemo 06/2016, then got cystoprostatectomy. (Urostomy--ileal conduit  urinary diversion with refluxing anastamoses--has RLQ urostomy)-gets routine surveillance by urol (Dr. Tresa Moore), most recent 04/2017--no sign of recurrence--obs status  . CAD (coronary artery disease)    small vessel dz x 1 vessel on cath 2014: med mgmt  . Cerebellar cerebrovascular accident (CVA) without late effect 10/2017   Left, punctate: DAPT therapy x 3 wks per Dr. Leonie Man, neuro, then ASA 81mg  alone.  Stable as of 11/2017 f/u with neuro, Dr. Erlinda Hong.  . Chronic calcific pancreatitis ([place]) 03/2016   +stable on f/u imaging 11/2018, + 2 small pseudocysts  . Chronic renal insufficiency, stage 2 (mild) 2019   Borderline II/III  . Diabetes mellitus without complication ([place])    Insulin dependent; DKA admission 10/2017.  . GI bleed 12/2017   Presented with melena and Hb around 5.  Transfused 5 U pRBCs, started to get hematochezia in hosp: no clear explanation for GI bleeding was found on EGD or colonoscopy.  CT abd w/out acute abnormality.  Upper GI w/SBFT showed subtle duodenal erosion.  [name] Heart murmur, systolic    Noted in prior PCP's records.  I recommended echocardiogram 07/2016 but pt declined.  [name] History of Bell's palsy   . History of chemotherapy   . History of kidney stones   . Hyperlipidemia   . Hypertension   . IDA (iron deficiency anemia) 12/2017   GI bleed  . Moderate aortic stenosis    Echo 03/2019. Pt asymptomatic.  Rpt 1 yr.   . Nephrolithiasis 03/2020  1 cm L renal pelvis stone-->hydronephrosis and pyelonephritis->sepsis (enterococcus and pseudomonas).  . Pancreas cyst 03/2016   CT abd/pelv 12/2017 for GI bleed w/u showed 1.6 cm pancreatic head cyst-->stable and likely benign.  Abd MRI 11/2018->stable pancreatic pseudocysts + chronic pancreatitis changes. Rpt MRI abd w &w/out contrast 2 yrs (approx 11/2020).  . Pneumonia    with Covid  . Sepsis ([place]) 03/2020   pseudomonas and enterococcus.  He had large L renal stone causing hydronephrosis->PCN was done.  [name] Umbilical hernia     Repaired when he got his bladder surgery.   Past Surgical History:  Procedure Laterality Date  . BIOPSY  12/18/2017   Procedure: BIOPSY;  Surgeon: Doran Stabler, MD;  Location: [facility];  Service: Gastroenterology;;  . CARDIAC CATHETERIZATION  02/09/2013   small vessel dz of 2nd diag branch with 70-80% stenosis--preserved LV function. Medical therapy rec'd. Agustin Cree, PA)  . CARDIOVASCULAR STRESS TEST  01/26/2013   No ischemia.  Wall motion abnormalities at inferior base suggesting small area of infarction.  EF 64%.  . CAROTID ARTERY DOPPLERS  10/15/2017   1-39% stenosis R ICA, no stenosis L ICA.  [name] CATARACT EXTRACTION    . COLONOSCOPY N/A 12/19/2017   Clotted blood in cecum w/out any underlying abnormality found -->entired colon and terminal ilium normal.  Procedure: COLONOSCOPY;  Surgeon: Doran Stabler, MD;  Location: [facility];  Service: Gastroenterology;  Laterality: N/A;  . cystoprostatectomy w/LN surgery  10/17/2016   Ileal conduit: this is an incontinent urine diversion that directs urine from the ureters through a segment of isolated bowel to the surface of the abdominal wall via a cutaneous stoma, and urine drains continuously into an external ostomy appliance.  . CYSTOSCOPY WITH INJECTION N/A 10/17/2016   Procedure: CYSTOSCOPY WITH INJECTION OF INDOCYANINE GREEN DYE;  Surgeon: Alexis Frock, MD;  Location: [facility];  Service: Urology;  Laterality: N/A;  . ESOPHAGOGASTRODUODENOSCOPY N/A 12/18/2017   Mild chronic gastritis, no metaplasia or dysplasia.  H pylori pending as of 12/21/17.  nonbleeding duodenal ulcers.  Procedure: ESOPHAGOGASTRODUODENOSCOPY (EGD);  Surgeon: Doran Stabler, MD;  Location: [facility];  Service: Gastroenterology;  Laterality: N/A;  . EYE SURGERY     cataract surgery bilat   . IR FLUORO GUIDE CV LINE RIGHT  04/17/2020  . IR GENERIC HISTORICAL  05/12/2016   IR US GUIDE VASC ACCESS RIGHT 05/12/2016 Greggory Keen, MD [facility]  . IR GENERIC  HISTORICAL  05/12/2016   IR FLUORO GUIDE PORT INSERTION RIGHT 05/12/2016 Greggory Keen, MD [facility]  . IR NEPHROSTOMY PLACEMENT LEFT  04/13/2020  . IR REMOVAL TUN ACCESS W/ PORT W/O FL MOD SED  07/03/2017  . IR REMOVAL TUN CV CATH W/O FL  05/03/2020  . IR US GUIDE VASC ACCESS RIGHT  04/17/2020  . OSTOMY     urostomy  . TONSILLECTOMY AND ADENOIDECTOMY  Remote  . TRANSTHORACIC ECHOCARDIOGRAM  10/16/2017; 03/2019   10/2017 EF 65-70%, normal wall motion, grd I DD.  03/2019 same but also new moderate aortic stenosis. Rpt echo 1 yr.  . TRANSURETHRAL RESECTION OF PROSTATE N/A 04/02/2016   Procedure: CYSTOSCOPY, TRANSURETHRAL RESECTION OF BLADDER TUMOR;  Surgeon: [name], MD;  Location: [facility];  Service: Urology;  Laterality: N/A;  . UMBILICAL HERNIA REPAIR  10/2016  . VASECTOMY      Medications: I have reviewed the patient's current medications. Allergies:  Allergies  Allergen Reactions  . Other Swelling and Other (See Comments)    Pt  states had swelling of the eye when having eye surgery and the anesthetic placed in eye- recovered the same day    Family History  Problem Relation Age of Onset  . Diabetes Mellitus I Mother   . Diabetes Mellitus I Sister   . Stroke Paternal Grandfather    Social History:  reports that he has quit smoking. He quit after 3.00 years of use. He has never used smokeless tobacco. He reports that he does not drink alcohol and does not use drugs.  ROS: All systems are reviewed and negative except as noted. Rest negative  Physical Exam:  Vital signs in last 24 hours: Temp:  [100.7 F (38.2 C)] 100.7 F (38.2 C) (10/27 1300) Pulse Rate:  [81-96] 81 (10/27 1635) Resp:  [13-18] 14 (10/27 1635) BP: (125-138)/(63-69) 128/67 (10/27 1635) SpO2:  [95 %-96 %] 95 % (10/27 1635)  Cardiovascular: Skin warm; not flushed Respiratory: Breaths quiet; no shortness of breath Abdomen: No masses Neurological: Normal sensation to touch Musculoskeletal: Normal  motor function arms and legs Lymphatics: No inguinal adenopathy Skin: No rashes Genitourinary: As noted  Laboratory Data:  Results for orders placed or performed during the hospital encounter of 05/09/20 (from the past 72 hour(s))  Lactic acid, plasma     Status: Abnormal   Collection Time: 05/09/20  1:58 PM  Result Value Ref Range   Lactic Acid, Venous 2.1 (HH) 0.5 - 1.9 mmol/L    Comment: CRITICAL RESULT CALLED TO, READ BACK BY AND VERIFIED WITH: DOWD,P. RN @1458  05/09/20 BILLINGSLEY,L Performed at [facility], [place] [address] [place], [place] [address]   Comprehensive metabolic panel     Status: Abnormal   Collection Time: 05/09/20  1:58 PM  Result Value Ref Range   Sodium 137 135 - 145 mmol/L   Potassium 3.8 3.5 - 5.1 mmol/L    Comment: DELTA CHECK NOTED   Chloride 105 98 - 111 mmol/L   CO2 18 (L) 22 - 32 mmol/L   Glucose, Bld 199 (H) 70 - 99 mg/dL    Comment: Glucose reference range applies only to samples taken after fasting for at least 8 hours.   BUN 35 (H) 8 - 23 mg/dL   Creatinine, Ser 1.76 (H) 0.61 - 1.24 mg/dL   Calcium 8.4 (L) 8.9 - 10.3 mg/dL   Total Protein 7.9 6.5 - 8.1 g/dL   Albumin 3.6 3.5 - 5.0 g/dL   AST 16 15 - 41 U/L   ALT 16 0 - 44 U/L   Alkaline Phosphatase 74 38 - 126 U/L   Total Bilirubin 0.6 0.3 - 1.2 mg/dL   GFR, Estimated 38 (L) >60 mL/min    Comment: (NOTE) Calculated using the CKD-EPI Creatinine Equation (2021)    Anion gap 14 5 - 15    Comment: Performed at [facility], [place] [address] [place], [place] [address]  CBC with Differential     Status: Abnormal   Collection Time: 05/09/20  1:58 PM  Result Value Ref Range   WBC 12.4 (H) 4.0 - 10.5 K/uL   RBC 4.19 (L) 4.22 - 5.81 MIL/uL   Hemoglobin 12.9 (L) 13.0 - 17.0 g/dL   HCT 40.2 39 - 52 %   MCV 95.9 80.0 - 100.0 fL   MCH 30.8 26.0 - 34.0 pg   MCHC 32.1 30.0 - 36.0 g/dL   RDW 13.8 11.5 - 15.5 %   Platelets 201 150 - 400 K/uL   nRBC 0.0 0.0 - 0.2  %  Neutrophils Relative % 84 %   Neutro Abs 10.5 (H) 1.7 - 7.7 K/uL   Lymphocytes Relative 8 %   Lymphs Abs 1.0 0.7 - 4.0 K/uL   Monocytes Relative 7 %   Monocytes Absolute 0.8 0.1 - 1.0 K/uL   Eosinophils Relative 0 %   Eosinophils Absolute 0.0 0.0 - 0.5 K/uL   Basophils Relative 1 %   Basophils Absolute 0.1 0.0 - 0.1 K/uL   Immature Granulocytes 0 %   Abs Immature Granulocytes 0.05 0.00 - 0.07 K/uL    Comment: Performed at [facility], [place] [address], [place], [place] [address]  Urinalysis, Routine w reflex microscopic Urine, Suprapubic     Status: Abnormal   Collection Time: 05/09/20  2:24 PM  Result Value Ref Range   Color, Urine STRAW (A) YELLOW   APPearance CLOUDY (A) CLEAR   Specific Gravity, Urine 1.008 1.005 - 1.030   pH 6.0 5.0 - 8.0   Glucose, UA NEGATIVE NEGATIVE mg/dL   Hgb urine dipstick SMALL (A) NEGATIVE   Bilirubin Urine NEGATIVE NEGATIVE   Ketones, ur NEGATIVE NEGATIVE mg/dL   Protein, ur 100 (A) NEGATIVE mg/dL   Nitrite POSITIVE (A) NEGATIVE   Leukocytes,Ua LARGE (A) NEGATIVE   RBC / HPF 21-50 0 - 5 RBC/hpf   WBC, UA >50 (H) 0 - 5 WBC/hpf   Bacteria, UA MANY (A) NONE SEEN   WBC Clumps PRESENT    Mucus PRESENT     Comment: Performed at [facility], [place] [address] [place], [place] [address]   No results found for this or any previous visit (from the past 240 hour(s)). Creatinine: Recent Labs    05/08/20 1346 05/09/20 1358  CREATININE 1.67* 1.76*    Xrays: See report/chart Patient has left renal stone and left percutaneous tube  Impression/Assessment:  Patient has left renal stone and left percutaneous tube.  He will be admitted for pyelonephritis under medicine.  We will follow the CT scan and follow him clinically  Plan:  Follow patient as noted above  Tahara Ruffini A Torben Soloway 05/09/2020, 5:09 PM

## 2020-05-09 NOTE — Consult Note (Signed)
[facility] for Infectious Disease  Total days of antibiotics 1       Reason for Consult: recurrent pyelonephritis    Referring Physician: gherghe  Active Problems:   Sepsis ([place])    HPI: Shane [name] is a 84 y.o. male with history of high grate urothelial carcinoma s/p resection with cystoprostatectomy and RLQ urostomy. Who was recently admitted for sepsis related to urinary source/nephrolithiasis requiring stent placement, he was treated with piptazo up until 10/11 for enterococcal and pseudomonas. He was to have lithotripsy next week however started to have new onset cloudy urine for left urostomy and new onset of fevers, fatigue. Concern for recurrent upper tract infection given he has retained stone.   Past Medical History:  Diagnosis Date  . Abnormal EKG 2014   This led to stress test which was abnl, which led to cardiology referral: cath showed small vessel dz of 2nd diag branch with 70-80% stenosis--preserved LV function  . Aortic atherosclerosis ([place])    w/out aneurism  . Bilateral renal cysts    Non-complex (CT 03/2016): no enhancement, coarse calcifications, mass effect does give false appearance of hydro on non-contract series (followed by urol).  . Bladder cancer ([place]) 03/2016   Invasive high grade urothelial carcinoma.  No mets at dx.  Cystoscopy + bx and partial resection of tumor in hosp when admitted for gross hematuria and AUR.  Neoadj chemo 06/2016, then got cystoprostatectomy. (Urostomy--ileal conduit urinary diversion with refluxing anastamoses--has RLQ urostomy)-gets routine surveillance by urol (Dr. Tresa Moore), most recent 04/2017--no sign of recurrence--obs status  . CAD (coronary artery disease)    small vessel dz x 1 vessel on cath 2014: med mgmt  . Cerebellar cerebrovascular accident (CVA) without late effect 10/2017   Left, punctate: DAPT therapy x 3 wks per Dr. Leonie Man, neuro, then ASA 81mg  alone.  Stable as of 11/2017 f/u with neuro, Dr. Erlinda Hong.  . Chronic  calcific pancreatitis ([place]) 03/2016   +stable on f/u imaging 11/2018, + 2 small pseudocysts  . Chronic renal insufficiency, stage 2 (mild) 2019   Borderline II/III  . Diabetes mellitus without complication ([place])    Insulin dependent; DKA admission 10/2017.  . GI bleed 12/2017   Presented with melena and Hb around 5.  Transfused 5 U pRBCs, started to get hematochezia in hosp: no clear explanation for GI bleeding was found on EGD or colonoscopy.  CT abd w/out acute abnormality.  Upper GI w/SBFT showed subtle duodenal erosion.  [name] Heart murmur, systolic    Noted in prior PCP's records.  I recommended echocardiogram 07/2016 but pt declined.  [name] History of Bell's palsy   . History of chemotherapy   . History of kidney stones   . Hyperlipidemia   . Hypertension   . IDA (iron deficiency anemia) 12/2017   GI bleed  . Moderate aortic stenosis    Echo 03/2019. Pt asymptomatic.  Rpt 1 yr.   . Nephrolithiasis 03/2020   1 cm L renal pelvis stone-->hydronephrosis and pyelonephritis->sepsis (enterococcus and pseudomonas).  . Pancreas cyst 03/2016   CT abd/pelv 12/2017 for GI bleed w/u showed 1.6 cm pancreatic head cyst-->stable and likely benign.  Abd MRI 11/2018->stable pancreatic pseudocysts + chronic pancreatitis changes. Rpt MRI abd w &w/out contrast 2 yrs (approx 11/2020).  . Pneumonia    with Covid  . Sepsis ([place]) 03/2020   pseudomonas and enterococcus.  He had large L renal stone causing hydronephrosis->PCN was done.  [name] Umbilical hernia    Repaired when he  got his bladder surgery.    Allergies:  Allergies  Allergen Reactions  . Other Swelling and Other (See Comments)    Pt states had swelling of the eye when having eye surgery and the anesthetic placed in eye- recovered the same day    MEDICATIONS: . [START ON 05/10/2020] insulin aspart  0-9 Units Subcutaneous TID WC    Social History   Tobacco Use  . Smoking status: Former Smoker    Years: 3.00  . Smokeless tobacco: Never Used  Vaping  Use  . Vaping Use: Never used  Substance Use Topics  . Alcohol use: No  . Drug use: No    Family History  Problem Relation Age of Onset  . Diabetes Mellitus I Mother   . Diabetes Mellitus I Sister   . Stroke Paternal Grandfather      Review of Systems  Constitutional: positive for fever, chills, diaphoresis, activity change, appetite change, fatigue and unexpected weight change.  HENT: Negative for congestion, sore throat, rhinorrhea, sneezing, trouble swallowing and sinus pressure.  Eyes: Negative for photophobia and visual disturbance.  Respiratory: Negative for cough, chest tightness, shortness of breath, wheezing and stridor.  Cardiovascular: Negative for chest pain, palpitations and leg swelling.  Gastrointestinal: Negative for nausea, vomiting, abdominal pain, diarrhea, constipation, blood in stool, abdominal distention and anal bleeding.  Genitourinary: Negative for dysuria, hematuria, flank pain and difficulty urinating.  Musculoskeletal: Negative for myalgias, back pain, joint swelling, arthralgias and gait problem.  Skin: Negative for color change, pallor, rash and wound.  Neurological: Negative for dizziness, tremors, weakness and light-headedness.  Hematological: Negative for adenopathy. Does not bruise/bleed easily.  Psychiatric/Behavioral: Negative for behavioral problems, confusion, sleep disturbance, dysphoric mood, decreased concentration and agitation.     OBJECTIVE: Temp:  [100.7 F (38.2 C)] 100.7 F (38.2 C) (10/27 1300) Pulse Rate:  [81-96] 92 (10/27 1745) Resp:  [13-18] 17 (10/27 1745) BP: (125-138)/(63-73) 126/73 (10/27 1745) SpO2:  [95 %-98 %] 98 % (10/27 1745) Physical Exam  Constitutional: He is oriented to person, place, and time. He appears well-developed and well-nourished. No distress.  HENT:  Mouth/Throat: Oropharynx is clear and moist. No oropharyngeal exudate.  Cardiovascular: Normal rate, regular rhythm and normal heart sounds. Exam  reveals no gallop and no friction rub.  No murmur heard.  Pulmonary/Chest: Effort normal and breath sounds normal. No respiratory distress. He has no wheezes.  Abdominal: Soft. Bowel sounds are normal. He exhibits no distension. There is no tenderness. RLQ urostomy some sediment but clear. Left nephrostomy collection system is cloudy Lymphadenopathy:  He has no cervical adenopathy.  Neurological: He is alert and oriented to person, place, and time.  Skin: Skin is warm and dry. No rash noted. No erythema.  Psychiatric: He has a normal mood and affect. His behavior is normal.     LABS: Results for orders placed or performed during the hospital encounter of 05/09/20 (from the past 48 hour(s))  Lactic acid, plasma     Status: Abnormal   Collection Time: 05/09/20  1:58 PM  Result Value Ref Range   Lactic Acid, Venous 2.1 (HH) 0.5 - 1.9 mmol/L    Comment: CRITICAL RESULT CALLED TO, READ BACK BY AND VERIFIED WITH: DOWD,P. RN @1458  05/09/20 BILLINGSLEY,L Performed at [facility], [place] [address], [place], [place] [address]   Comprehensive metabolic panel     Status: Abnormal   Collection Time: 05/09/20  1:58 PM  Result Value Ref Range   Sodium 137 135 - 145 mmol/L  Potassium 3.8 3.5 - 5.1 mmol/L    Comment: DELTA CHECK NOTED   Chloride 105 98 - 111 mmol/L   CO2 18 (L) 22 - 32 mmol/L   Glucose, Bld 199 (H) 70 - 99 mg/dL    Comment: Glucose reference range applies only to samples taken after fasting for at least 8 hours.   BUN 35 (H) 8 - 23 mg/dL   Creatinine, Ser 1.76 (H) 0.61 - 1.24 mg/dL   Calcium 8.4 (L) 8.9 - 10.3 mg/dL   Total Protein 7.9 6.5 - 8.1 g/dL   Albumin 3.6 3.5 - 5.0 g/dL   AST 16 15 - 41 U/L   ALT 16 0 - 44 U/L   Alkaline Phosphatase 74 38 - 126 U/L   Total Bilirubin 0.6 0.3 - 1.2 mg/dL   GFR, Estimated 38 (L) >60 mL/min    Comment: (NOTE) Calculated using the CKD-EPI Creatinine Equation (2021)    Anion gap 14 5 - 15    Comment: Performed at  [facility], [place] [address] [place], [place] [address]  CBC with Differential     Status: Abnormal   Collection Time: 05/09/20  1:58 PM  Result Value Ref Range   WBC 12.4 (H) 4.0 - 10.5 K/uL   RBC 4.19 (L) 4.22 - 5.81 MIL/uL   Hemoglobin 12.9 (L) 13.0 - 17.0 g/dL   HCT 40.2 39 - 52 %   MCV 95.9 80.0 - 100.0 fL   MCH 30.8 26.0 - 34.0 pg   MCHC 32.1 30.0 - 36.0 g/dL   RDW 13.8 11.5 - 15.5 %   Platelets 201 150 - 400 K/uL   nRBC 0.0 0.0 - 0.2 %   Neutrophils Relative % 84 %   Neutro Abs 10.5 (H) 1.7 - 7.7 K/uL   Lymphocytes Relative 8 %   Lymphs Abs 1.0 0.7 - 4.0 K/uL   Monocytes Relative 7 %   Monocytes Absolute 0.8 0.1 - 1.0 K/uL   Eosinophils Relative 0 %   Eosinophils Absolute 0.0 0.0 - 0.5 K/uL   Basophils Relative 1 %   Basophils Absolute 0.1 0.0 - 0.1 K/uL   Immature Granulocytes 0 %   Abs Immature Granulocytes 0.05 0.00 - 0.07 K/uL    Comment: Performed at [facility], [place] [address], [place], [place] [address]  Urinalysis, Routine w reflex microscopic Urine, Suprapubic     Status: Abnormal   Collection Time: 05/09/20  2:24 PM  Result Value Ref Range   Color, Urine STRAW (A) YELLOW   APPearance CLOUDY (A) CLEAR   Specific Gravity, Urine 1.008 1.005 - 1.030   pH 6.0 5.0 - 8.0   Glucose, UA NEGATIVE NEGATIVE mg/dL   Hgb urine dipstick SMALL (A) NEGATIVE   Bilirubin Urine NEGATIVE NEGATIVE   Ketones, ur NEGATIVE NEGATIVE mg/dL   Protein, ur 100 (A) NEGATIVE mg/dL   Nitrite POSITIVE (A) NEGATIVE   Leukocytes,Ua LARGE (A) NEGATIVE   RBC / HPF 21-50 0 - 5 RBC/hpf   WBC, UA >50 (H) 0 - 5 WBC/hpf   Bacteria, UA MANY (A) NONE SEEN   WBC Clumps PRESENT    Mucus PRESENT     Comment: Performed at [facility], [place] [address] [place], [place] [address]    MICRO: pending IMAGING: DG Chest Portable 1 View  Result Date: 05/09/2020 CLINICAL DATA:  Cough, fever. EXAM: PORTABLE CHEST 1 VIEW COMPARISON:  April 08, 2020. FINDINGS: The heart size and mediastinal contours are  within normal limits. No pneumothorax or pleural effusion is noted. Right lung is clear. Minimal left basilar subsegmental atelectasis or scarring is noted. The visualized skeletal structures are unremarkable. IMPRESSION: Minimal left basilar subsegmental atelectasis or scarring. Electronically Signed   By: Marijo Conception M.D.   On: 05/09/2020 14:50    Assessment/Plan:  84yo with nephrolithiasis s/p stent, recently treated for pyelonephritis with obstructive stone, finished abtx 2 wks ago but now has acute onset of fever, cloudy urine in left nephrostomy system, and mild leukocytosis concern for recurrent infection.  - recommend to start meropenem and will get urine and blood cx to help narrow spectrum - recommend to get urology consultation to see if can do lithotripsy sooner than later to remove nidus of infection. - will need to see if need to treat for bacteremia pending blood cx results.  Health maintenance = needs booster dose of covid vaccine, he is open to getting it while he is hospitalized.  Elzie Rings [facility] Infectious [facility] [phone]

## 2020-05-09 NOTE — ED Provider Notes (Signed)
[facility] DEPT Provider Note   CSN: [id number] Arrival date & time: 05/09/20  1243     History Chief Complaint  Patient presents with  . Weakness  . Fever    Coyle Stordahl is a 84 y.o. male.  HPI Patient presents with fever.  Has had for last couple days.  Generalized weakness also.  Recent admission to the hospital with infected obstructed stone on the left.  Has percutaneous nephrostomy on the left.  States it has gotten cloudy.  Temperature up to 102 at home.  No diarrhea.  No lightheadedness or dizziness.  States he has felt just generally bad.  Said he discussed with Dr. Tresa Moore his urologist who told him to come the ER.  Also has urostomy due to previous bladder cancer.  Had been out of the hospital around 3 weeks.  Had a week of IV Zosyn after discharge.does not have the flank pain like he did when he hit the obstruction.  Due to have stone removed in another 2 weeks.    Past Medical History:  Diagnosis Date  . Abnormal EKG 2014   This led to stress test which was abnl, which led to cardiology referral: cath showed small vessel dz of 2nd diag branch with 70-80% stenosis--preserved LV function  . Aortic atherosclerosis ([place])    w/out aneurism  . Bilateral renal cysts    Non-complex (CT 03/2016): no enhancement, coarse calcifications, mass effect does give false appearance of hydro on non-contract series (followed by urol).  . Bladder cancer ([place]) 03/2016   Invasive high grade urothelial carcinoma.  No mets at dx.  Cystoscopy + bx and partial resection of tumor in hosp when admitted for gross hematuria and AUR.  Neoadj chemo 06/2016, then got cystoprostatectomy. (Urostomy--ileal conduit urinary diversion with refluxing anastamoses--has RLQ urostomy)-gets routine surveillance by urol (Dr. Tresa Moore), most recent 04/2017--no sign of recurrence--obs status  . CAD (coronary artery disease)    small vessel dz x 1 vessel on cath 2014: med mgmt  .  Cerebellar cerebrovascular accident (CVA) without late effect 10/2017   Left, punctate: DAPT therapy x 3 wks per Dr. Leonie Man, neuro, then ASA 81mg  alone.  Stable as of 11/2017 f/u with neuro, Dr. Erlinda Hong.  . Chronic calcific pancreatitis ([place]) 03/2016   +stable on f/u imaging 11/2018, + 2 small pseudocysts  . Chronic renal insufficiency, stage 2 (mild) 2019   Borderline II/III  . Diabetes mellitus without complication ([place])    Insulin dependent; DKA admission 10/2017.  . GI bleed 12/2017   Presented with melena and Hb around 5.  Transfused 5 U pRBCs, started to get hematochezia in hosp: no clear explanation for GI bleeding was found on EGD or colonoscopy.  CT abd w/out acute abnormality.  Upper GI w/SBFT showed subtle duodenal erosion.  [name] Heart murmur, systolic    Noted in prior PCP's records.  I recommended echocardiogram 07/2016 but pt declined.  [name] History of Bell's palsy   . History of chemotherapy   . History of kidney stones   . Hyperlipidemia   . Hypertension   . IDA (iron deficiency anemia) 12/2017   GI bleed  . Moderate aortic stenosis    Echo 03/2019. Pt asymptomatic.  Rpt 1 yr.   . Nephrolithiasis 03/2020   1 cm L renal pelvis stone-->hydronephrosis and pyelonephritis->sepsis (enterococcus and pseudomonas).  . Pancreas cyst 03/2016   CT abd/pelv 12/2017 for GI bleed w/u showed 1.6 cm pancreatic head cyst-->stable and likely benign.  Abd  MRI 11/2018->stable pancreatic pseudocysts + chronic pancreatitis changes. Rpt MRI abd w &w/out contrast 2 yrs (approx 11/2020).  . Pneumonia    with Covid  . Sepsis ([place]) 03/2020   pseudomonas and enterococcus.  He had large L renal stone causing hydronephrosis->PCN was done.  [name] Umbilical hernia    Repaired when he got his bladder surgery.    Patient Active Problem List   Diagnosis Date Noted  . Bacteremia 04/19/2020  . AKI (acute kidney injury) ([place]) 04/09/2020  . Severe sepsis ([place]) 04/09/2020  . Lobar pneumonia ([place]) 04/09/2020  . Melena   .  Duodenal ulcer   . GI bleed 12/17/2017  . Acute blood loss anemia 12/17/2017  . Cerebral thrombosis with cerebral infarction 10/15/2017  . Malnutrition of moderate degree 10/13/2017  . DKA (diabetic ketoacidoses) 10/12/2017  . Port catheter in place 02/25/2017  . S/P ileal conduit ([place]) 10/17/2016  . Malignant neoplasm of urinary bladder ([place]) 05/01/2016  . Diabetes mellitus without complication ([place]) 29/47/6546  . UTI (urinary tract infection) 04/01/2016  . Hypertension   . Hematoma of bladder wall   . Hematuria 03/31/2016    Past Surgical History:  Procedure Laterality Date  . BIOPSY  12/18/2017   Procedure: BIOPSY;  Surgeon: Doran Stabler, MD;  Location: [facility];  Service: Gastroenterology;;  . CARDIAC CATHETERIZATION  02/09/2013   small vessel dz of 2nd diag branch with 70-80% stenosis--preserved LV function. Medical therapy rec'd. Agustin Cree, PA)  . CARDIOVASCULAR STRESS TEST  01/26/2013   No ischemia.  Wall motion abnormalities at inferior base suggesting small area of infarction.  EF 64%.  . CAROTID ARTERY DOPPLERS  10/15/2017   1-39% stenosis R ICA, no stenosis L ICA.  [name] CATARACT EXTRACTION    . COLONOSCOPY N/A 12/19/2017   Clotted blood in cecum w/out any underlying abnormality found -->entired colon and terminal ilium normal.  Procedure: COLONOSCOPY;  Surgeon: Doran Stabler, MD;  Location: [facility];  Service: Gastroenterology;  Laterality: N/A;  . cystoprostatectomy w/LN surgery  10/17/2016   Ileal conduit: this is an incontinent urine diversion that directs urine from the ureters through a segment of isolated bowel to the surface of the abdominal wall via a cutaneous stoma, and urine drains continuously into an external ostomy appliance.  . CYSTOSCOPY WITH INJECTION N/A 10/17/2016   Procedure: CYSTOSCOPY WITH INJECTION OF INDOCYANINE GREEN DYE;  Surgeon: Alexis Frock, MD;  Location: [facility];  Service: Urology;  Laterality: N/A;  . ESOPHAGOGASTRODUODENOSCOPY  N/A 12/18/2017   Mild chronic gastritis, no metaplasia or dysplasia.  H pylori pending as of 12/21/17.  nonbleeding duodenal ulcers.  Procedure: ESOPHAGOGASTRODUODENOSCOPY (EGD);  Surgeon: Doran Stabler, MD;  Location: [facility];  Service: Gastroenterology;  Laterality: N/A;  . EYE SURGERY     cataract surgery bilat   . IR FLUORO GUIDE CV LINE RIGHT  04/17/2020  . IR GENERIC HISTORICAL  05/12/2016   IR US GUIDE VASC ACCESS RIGHT 05/12/2016 Greggory Keen, MD [facility]  . IR GENERIC HISTORICAL  05/12/2016   IR FLUORO GUIDE PORT INSERTION RIGHT 05/12/2016 Greggory Keen, MD [facility]  . IR NEPHROSTOMY PLACEMENT LEFT  04/13/2020  . IR REMOVAL TUN ACCESS W/ PORT W/O FL MOD SED  07/03/2017  . IR REMOVAL TUN CV CATH W/O FL  05/03/2020  . IR US GUIDE VASC ACCESS RIGHT  04/17/2020  . OSTOMY     urostomy  . TONSILLECTOMY AND ADENOIDECTOMY  Remote  . TRANSTHORACIC ECHOCARDIOGRAM  10/16/2017; 03/2019  10/2017 EF 65-70%, normal wall motion, grd I DD.  03/2019 same but also new moderate aortic stenosis. Rpt echo 1 yr.  . TRANSURETHRAL RESECTION OF PROSTATE N/A 04/02/2016   Procedure: CYSTOSCOPY, TRANSURETHRAL RESECTION OF BLADDER TUMOR;  Surgeon: [name], MD;  Location: [facility];  Service: Urology;  Laterality: N/A;  . UMBILICAL HERNIA REPAIR  10/2016  . VASECTOMY         Family History  Problem Relation Age of Onset  . Diabetes Mellitus I Mother   . Diabetes Mellitus I Sister   . Stroke Paternal Grandfather     Social History   Tobacco Use  . Smoking status: Former Smoker    Years: 3.00  . Smokeless tobacco: Never Used  Vaping Use  . Vaping Use: Never used  Substance Use Topics  . Alcohol use: No  . Drug use: No    Home Medications Prior to Admission medications   Medication Sig Start Date End Date Taking? Authorizing Provider  albuterol (VENTOLIN HFA) 108 (90 Base) MCG/ACT inhaler Inhale 2 puffs into the lungs every 6 (six) hours as needed. Patient not taking:  Reported on 04/23/2020 07/22/19   Saguier, Percell Miller, PA-C  aspirin EC 81 MG tablet Take 1 tablet (81 mg total) by mouth daily. Patient not taking: Reported on 04/08/2020 05/17/19   Tammi Sou, MD  cetirizine (ZYRTEC) 10 MG tablet Take 1 tablet (10 mg total) by mouth daily. Patient not taking: Reported on 04/23/2020 02/18/19   Tammi Sou, MD  Continuous Blood Gluc Sensor (FREESTYLE LIBRE 14 DAY SENSOR) MISC Inject 1 patch into the skin every 14 (fourteen) days. 05/02/20   McGowen, [name], MD  Ferrous Sulfate 27 MG TABS Take 27 mg by mouth daily with breakfast.    [provider]  insulin aspart (NOVOLOG FLEXPEN) 100 UNIT/ML FlexPen CBG < 70: implement hypoglycemia protocol CBG 70 - 120: 3 units CBG 121 - 150: 5 units CBG 151 - 200: 6 units CBG 201 - 250: 8 units CBG 251 - 300: 11 units CBG 301 - 350: 14 units CBG 351 - 400: 18 units CBG > 400: call MD 02/04/18   Tammi Sou, MD  Insulin Glargine (LANTUS SOLOSTAR) 100 UNIT/ML Solostar Pen INJECT 5 UNITS SUBQ AT BEDTIME Patient taking differently: Inject 5 Units into the skin at bedtime. INJECT 5 UNITS SUBQ AT BEDTIME 04/07/19   McGowen, [name], MD  Insulin Pen Needle (PEN NEEDLES) 32G X 4 MM MISC 1 each by Does not apply route daily. 10/07/17   McGowen, [name], MD  Insulin Pen Needle 31G X 5 MM MISC Inject insulin via insulin pen 3 x daily with meals 11/17/17   McGowen, [name], MD  Lancets 30G MISC Use to check blood sugar 4 times daily 01/21/18   McGowen, [name], MD  Omega-3 Fatty Acids (FISH OIL PO) Take 1 capsule by mouth daily.     [provider]  pravastatin (PRAVACHOL) 20 MG tablet TAKE 1 TABLET BY MOUTH EVERY DAY Patient taking differently: Take 20 mg by mouth daily.  10/31/19   McGowen, [name], MD    Allergies    Other  Review of Systems   Review of Systems  Constitutional: Positive for appetite change, chills and fever.  HENT: Negative for congestion.   Cardiovascular: Negative for chest  pain.  Gastrointestinal: Negative for abdominal pain.  Genitourinary: Negative for flank pain.  Musculoskeletal: Negative for back pain.  Skin: Negative for rash.  Psychiatric/Behavioral: Negative  for confusion.    Physical Exam Updated Vital Signs BP 125/63   Pulse 85   Temp (!) 100.7 F (38.2 C) (Oral)   Resp 18   SpO2 96%   Physical Exam Vitals and nursing note reviewed.  HENT:     Head: Normocephalic.  Eyes:     Extraocular Movements: Extraocular movements intact.  Cardiovascular:     Rate and Rhythm: Regular rhythm.  Abdominal:     Comments: Urostomy in lower abdomen.  Genitourinary:    Comments: Nephrostomy left flank.  Well-appearing.  Does have cloudy urine in the bag.  Clear urine in urostomy bag. Musculoskeletal:     Cervical back: Tenderness present.  Skin:    General: Skin is warm.     Capillary Refill: Capillary refill takes less than 2 seconds.  Neurological:     Mental Status: He is alert and oriented to person, place, and time.     ED Results / Procedures / Treatments   Labs (all labs ordered are listed, but only abnormal results are displayed) Labs Reviewed  LACTIC ACID, PLASMA - Abnormal; Notable for the following components:      Result Value   Lactic Acid, Venous 2.1 (*)    All other components within normal limits  COMPREHENSIVE METABOLIC PANEL - Abnormal; Notable for the following components:   CO2 18 (*)    Glucose, Bld 199 (*)    BUN 35 (*)    Creatinine, Ser 1.76 (*)    Calcium 8.4 (*)    GFR, Estimated 38 (*)    All other components within normal limits  CBC WITH DIFFERENTIAL/PLATELET - Abnormal; Notable for the following components:   WBC 12.4 (*)    RBC 4.19 (*)    Hemoglobin 12.9 (*)    Neutro Abs 10.5 (*)    All other components within normal limits  URINALYSIS, ROUTINE W REFLEX MICROSCOPIC - Abnormal; Notable for the following components:   Color, Urine STRAW (*)    APPearance CLOUDY (*)    Hgb urine dipstick SMALL (*)     Protein, ur 100 (*)    Nitrite POSITIVE (*)    Leukocytes,Ua LARGE (*)    WBC, UA >50 (*)    Bacteria, UA MANY (*)    All other components within normal limits  URINE CULTURE  CULTURE, BLOOD (ROUTINE X 2)  CULTURE, BLOOD (ROUTINE X 2)  LACTIC ACID, PLASMA    EKG None  Radiology DG Chest Portable 1 View  Result Date: 05/09/2020 CLINICAL DATA:  Cough, fever. EXAM: PORTABLE CHEST 1 VIEW COMPARISON:  April 08, 2020. FINDINGS: The heart size and mediastinal contours are within normal limits. No pneumothorax or pleural effusion is noted. Right lung is clear. Minimal left basilar subsegmental atelectasis or scarring is noted. The visualized skeletal structures are unremarkable. IMPRESSION: Minimal left basilar subsegmental atelectasis or scarring. Electronically Signed   By: Marijo Conception M.D.   On: 05/09/2020 14:50    Procedures Procedures (including critical care time)  Medications Ordered in ED Medications  meropenem (MERREM) 1 g in sodium chloride 0.9 % 100 mL IVPB (has no administration in time range)    ED Course  I have reviewed the triage vital signs and the nursing notes.  Pertinent labs & imaging results that were available during my care of the patient were reviewed by me and considered in my medical decision making (see chart for details).    MDM Rules/Calculators/A&P  Patient with fever.  Recurrent infection in likely left kidney/stone.  Has percutaneous nephrostomy that is still draining so obstruction felt less likely.  Urine shows infection.  White count elevated.  Mildly elevated lactic acid.  Discussed with Dr. Graylon Good from infectious disease.  Will give meropenem since had recently been on Zosyn.  Also discussed with Dr. Worthy Flank from urology.  He will be following patient.  Recommend CT scan to evaluate for other complications such as abscess.  Will admit to internal medicine.  I have reviewed labs and imaging myself.   Final  Clinical Impression(s) / ED Diagnoses Final diagnoses:  Pyelonephritis    Rx / DC Orders ED Discharge Orders    None       Davonna Belling, MD 05/09/20 1554

## 2020-05-09 NOTE — H&P (Signed)
History and Physical    Shane [name] [id number]:[id number] DOB: 29-Dec-1935 DOA: 05/09/2020  I have briefly reviewed the patient's prior medical records in [place]  PCP: Tammi Sou, MD  Patient coming from: home  Chief Complaint: Weakness, fever  HPI: Shane [name] is a 84 y.o. male with medical history significant of bladder cancer status post ilial conduit, chronic kidney disease stage IIIa, CAD, HTN, HLD, recurrent UTIs, history of GI bleed, prior CVA, diabetes mellitus who came into the ER with complaints of generalized weakness profound over the last 24 hours.  Wife also reports a fever of 102 this morning.  He was recently hospitalized and discharged on 04/17/20 for UTI with Enterococcus faecalis and Pseudomonas bacteremia, ID was consulted.  He also was found to have a large left renal pelvis stone 10 x 6 mm which was causing hydronephrosis and pyelonephritis.  Urology was consulted and eventually patient underwent IR placement of left percutaneous nephrostomy tube on 10/1.  He eventually improved and was discharged home on 10/5 with intravenous Zosyn which was done on 10/11.  Patient has been feeling well up until this morning, when his main complaint was weakness.  He denies any chest pain, denies any shortness of breath.  He denies any abdominal pain, nausea or vomiting.  Due to the fever and weakness they called urology office and he was directed to come to the ER.  ED Course: In the ED patient has a temp of 100.7, he is normotensive and satting well on room air.  Blood work shows a WBC of 12.4 and lactic acid minimally elevated at 2.1.  Creatinine is 1.7.  Urinalysis shows many bacteria, positive leukoesterase and nitrites.  ED discussed with ID and recommended meropenem, also consulted urology and they recommended a CT scan of the abdomen pelvis which is pending.  We are asked to admit.  Review of Systems: All systems reviewed, and apart from HPI, all negative  Past  Medical History:  Diagnosis Date  . Abnormal EKG 2014   This led to stress test which was abnl, which led to cardiology referral: cath showed small vessel dz of 2nd diag branch with 70-80% stenosis--preserved LV function  . Aortic atherosclerosis ([place])    w/out aneurism  . Bilateral renal cysts    Non-complex (CT 03/2016): no enhancement, coarse calcifications, mass effect does give false appearance of hydro on non-contract series (followed by urol).  . Bladder cancer ([place]) 03/2016   Invasive high grade urothelial carcinoma.  No mets at dx.  Cystoscopy + bx and partial resection of tumor in hosp when admitted for gross hematuria and AUR.  Neoadj chemo 06/2016, then got cystoprostatectomy. (Urostomy--ileal conduit urinary diversion with refluxing anastamoses--has RLQ urostomy)-gets routine surveillance by urol (Dr. Tresa Moore), most recent 04/2017--no sign of recurrence--obs status  . CAD (coronary artery disease)    small vessel dz x 1 vessel on cath 2014: med mgmt  . Cerebellar cerebrovascular accident (CVA) without late effect 10/2017   Left, punctate: DAPT therapy x 3 wks per Dr. Leonie Man, neuro, then ASA 81mg  alone.  Stable as of 11/2017 f/u with neuro, Dr. Erlinda Hong.  . Chronic calcific pancreatitis ([place]) 03/2016   +stable on f/u imaging 11/2018, + 2 small pseudocysts  . Chronic renal insufficiency, stage 2 (mild) 2019   Borderline II/III  . Diabetes mellitus without complication ([place])    Insulin dependent; DKA admission 10/2017.  . GI bleed 12/2017   Presented with melena and Hb around 5.  Transfused  5 U pRBCs, started to get hematochezia in hosp: no clear explanation for GI bleeding was found on EGD or colonoscopy.  CT abd w/out acute abnormality.  Upper GI w/SBFT showed subtle duodenal erosion.  [name] Heart murmur, systolic    Noted in prior PCP's records.  I recommended echocardiogram 07/2016 but pt declined.  [name] History of Bell's palsy   . History of chemotherapy   . History of kidney stones   .  Hyperlipidemia   . Hypertension   . IDA (iron deficiency anemia) 12/2017   GI bleed  . Moderate aortic stenosis    Echo 03/2019. Pt asymptomatic.  Rpt 1 yr.   . Nephrolithiasis 03/2020   1 cm L renal pelvis stone-->hydronephrosis and pyelonephritis->sepsis (enterococcus and pseudomonas).  . Pancreas cyst 03/2016   CT abd/pelv 12/2017 for GI bleed w/u showed 1.6 cm pancreatic head cyst-->stable and likely benign.  Abd MRI 11/2018->stable pancreatic pseudocysts + chronic pancreatitis changes. Rpt MRI abd w &w/out contrast 2 yrs (approx 11/2020).  . Pneumonia    with Covid  . Sepsis ([place]) 03/2020   pseudomonas and enterococcus.  He had large L renal stone causing hydronephrosis->PCN was done.  [name] Umbilical hernia    Repaired when he got his bladder surgery.    Past Surgical History:  Procedure Laterality Date  . BIOPSY  12/18/2017   Procedure: BIOPSY;  Surgeon: Doran Stabler, MD;  Location: [facility];  Service: Gastroenterology;;  . CARDIAC CATHETERIZATION  02/09/2013   small vessel dz of 2nd diag branch with 70-80% stenosis--preserved LV function. Medical therapy rec'd. Agustin Cree, PA)  . CARDIOVASCULAR STRESS TEST  01/26/2013   No ischemia.  Wall motion abnormalities at inferior base suggesting small area of infarction.  EF 64%.  . CAROTID ARTERY DOPPLERS  10/15/2017   1-39% stenosis R ICA, no stenosis L ICA.  [name] CATARACT EXTRACTION    . COLONOSCOPY N/A 12/19/2017   Clotted blood in cecum w/out any underlying abnormality found -->entired colon and terminal ilium normal.  Procedure: COLONOSCOPY;  Surgeon: Doran Stabler, MD;  Location: [facility];  Service: Gastroenterology;  Laterality: N/A;  . cystoprostatectomy w/LN surgery  10/17/2016   Ileal conduit: this is an incontinent urine diversion that directs urine from the ureters through a segment of isolated bowel to the surface of the abdominal wall via a cutaneous stoma, and urine drains continuously into an external ostomy  appliance.  . CYSTOSCOPY WITH INJECTION N/A 10/17/2016   Procedure: CYSTOSCOPY WITH INJECTION OF INDOCYANINE GREEN DYE;  Surgeon: Alexis Frock, MD;  Location: [facility];  Service: Urology;  Laterality: N/A;  . ESOPHAGOGASTRODUODENOSCOPY N/A 12/18/2017   Mild chronic gastritis, no metaplasia or dysplasia.  H pylori pending as of 12/21/17.  nonbleeding duodenal ulcers.  Procedure: ESOPHAGOGASTRODUODENOSCOPY (EGD);  Surgeon: Doran Stabler, MD;  Location: [facility];  Service: Gastroenterology;  Laterality: N/A;  . EYE SURGERY     cataract surgery bilat   . IR FLUORO GUIDE CV LINE RIGHT  04/17/2020  . IR GENERIC HISTORICAL  05/12/2016   IR US GUIDE VASC ACCESS RIGHT 05/12/2016 Greggory Keen, MD [facility]  . IR GENERIC HISTORICAL  05/12/2016   IR FLUORO GUIDE PORT INSERTION RIGHT 05/12/2016 Greggory Keen, MD [facility]  . IR NEPHROSTOMY PLACEMENT LEFT  04/13/2020  . IR REMOVAL TUN ACCESS W/ PORT W/O FL MOD SED  07/03/2017  . IR REMOVAL TUN CV CATH W/O FL  05/03/2020  . IR US GUIDE VASC ACCESS RIGHT  04/17/2020  .  OSTOMY     urostomy  . TONSILLECTOMY AND ADENOIDECTOMY  Remote  . TRANSTHORACIC ECHOCARDIOGRAM  10/16/2017; 03/2019   10/2017 EF 65-70%, normal wall motion, grd I DD.  03/2019 same but also new moderate aortic stenosis. Rpt echo 1 yr.  . TRANSURETHRAL RESECTION OF PROSTATE N/A 04/02/2016   Procedure: CYSTOSCOPY, TRANSURETHRAL RESECTION OF BLADDER TUMOR;  Surgeon: [name], MD;  Location: [facility];  Service: Urology;  Laterality: N/A;  . UMBILICAL HERNIA REPAIR  10/2016  . VASECTOMY       reports that he has quit smoking. He quit after 3.00 years of use. He has never used smokeless tobacco. He reports that he does not drink alcohol and does not use drugs.  Allergies  Allergen Reactions  . Other Swelling and Other (See Comments)    Pt states had swelling of the eye when having eye surgery and the anesthetic placed in eye- recovered the same day    Family History  Problem  Relation Age of Onset  . Diabetes Mellitus I Mother   . Diabetes Mellitus I Sister   . Stroke Paternal Grandfather     Prior to Admission medications   Medication Sig Start Date End Date Taking? Authorizing Provider  Ferrous Sulfate 27 MG TABS Take 27 mg by mouth daily with breakfast.   Yes [provider]  insulin aspart (NOVOLOG FLEXPEN) 100 UNIT/ML FlexPen CBG < 70: implement hypoglycemia protocol CBG 70 - 120: 3 units CBG 121 - 150: 5 units CBG 151 - 200: 6 units CBG 201 - 250: 8 units CBG 251 - 300: 11 units CBG 301 - 350: 14 units CBG 351 - 400: 18 units CBG > 400: call MD Patient taking differently: Inject 0-18 Units into the skin 3 (three) times daily with meals. CBG < 70: implement hypoglycemia protocol CBG 70 - 120: 3 units CBG 121 - 150: 5 units CBG 151 - 200: 6 units CBG 201 - 250: 8 units CBG 251 - 300: 11 units CBG 301 - 350: 14 units CBG 351 - 400: 18 units CBG > 400: call MD 02/04/18  Yes McGowen, [name], MD  Omega-3 Fatty Acids (FISH OIL PO) Take 1 capsule by mouth daily.    Yes [provider]  albuterol (VENTOLIN HFA) 108 (90 Base) MCG/ACT inhaler Inhale 2 puffs into the lungs every 6 (six) hours as needed. Patient not taking: Reported on 04/23/2020 07/22/19   Saguier, Percell Miller, PA-C  aspirin EC 81 MG tablet Take 1 tablet (81 mg total) by mouth daily. Patient not taking: Reported on 04/08/2020 05/17/19   Tammi Sou, MD  cetirizine (ZYRTEC) 10 MG tablet Take 1 tablet (10 mg total) by mouth daily. Patient not taking: Reported on 04/23/2020 02/18/19   Tammi Sou, MD  Continuous Blood Gluc Sensor (FREESTYLE LIBRE 14 DAY SENSOR) MISC Inject 1 patch into the skin every 14 (fourteen) days. 05/02/20   McGowen, [name], MD  Insulin Glargine (LANTUS SOLOSTAR) 100 UNIT/ML Solostar Pen INJECT 5 UNITS SUBQ AT BEDTIME Patient not taking: Reported on 05/09/2020 04/07/19   Tammi Sou, MD  Insulin Pen Needle (PEN NEEDLES) 32G X 4 MM MISC 1 each  by Does not apply route daily. 10/07/17   McGowen, [name], MD  Insulin Pen Needle 31G X 5 MM MISC Inject insulin via insulin pen 3 x daily with meals 11/17/17   McGowen, [name], MD  Lancets 30G MISC Use to check blood sugar 4 times daily 01/21/18  McGowen, [name], MD  pravastatin (PRAVACHOL) 20 MG tablet TAKE 1 TABLET BY MOUTH EVERY DAY Patient taking differently: Take 20 mg by mouth daily.  10/31/19   Tammi Sou, MD    Physical Exam: Vitals:   05/09/20 1300 05/09/20 1422 05/09/20 1512 05/09/20 1635  BP: 135/69 138/69 125/63 128/67  Pulse: 96 81 85 81  Resp: 16 13 18 14   Temp: (!) 100.7 F (38.2 C)     TempSrc: Oral     SpO2: 95% 96% 96% 95%      Constitutional: NAD, calm, comfortable Eyes: PERRL, lids and conjunctivae normal ENMT: Mucous membranes are moist. Posterior pharynx clear of any exudate or lesions.Normal dentition.  Neck: normal, supple Respiratory: clear to auscultation bilaterally, no wheezing, no crackles. Normal respiratory effort. No accessory muscle use.  Cardiovascular: Regular rate and rhythm, no murmurs / rubs / gallops. No extremity edema. 2+ pedal pulses.  Abdomen: no tenderness, no masses palpated. Bowel sounds positive.  Musculoskeletal: no clubbing / cyanosis. Normal muscle tone.  Skin: no rashes, lesions, ulcers. No induration Neurologic: CN 2-12 grossly intact. Strength 5/5 in all 4.  Psychiatric: Normal judgment and insight. Alert and oriented x 3. Normal mood.   Labs on Admission: I have personally reviewed following labs and imaging studies  CBC: Recent Labs  Lab 05/08/20 1346 05/09/20 1358  WBC 8.6 12.4*  NEUTROABS  --  10.5*  HGB 12.4* 12.9*  HCT 39.8 40.2  MCV 96.4 95.9  PLT 201 102   Basic Metabolic Panel: Recent Labs  Lab 05/08/20 1346 05/09/20 1358  NA 140 137  K 4.9 3.8  CL 108 105  CO2 22 18*  GLUCOSE 105* 199*  BUN 41* 35*  CREATININE 1.67* 1.76*  CALCIUM 9.1 8.4*   Liver Function Tests: Recent Labs  Lab  05/09/20 1358  AST 16  ALT 16  ALKPHOS 74  BILITOT 0.6  PROT 7.9  ALBUMIN 3.6   Coagulation Profile: No results for input(s): INR, PROTIME in the last 168 hours. BNP (last 3 results) No results for input(s): PROBNP in the last 8760 hours. CBG: Recent Labs  Lab 05/08/20 1307  GLUCAP 119*   Thyroid Function Tests: No results for input(s): TSH, T4TOTAL, FREET4, T3FREE, THYROIDAB in the last 72 hours. Urine analysis:    Component Value Date/Time   COLORURINE STRAW (A) 05/09/2020 1424   APPEARANCEUR CLOUDY (A) 05/09/2020 1424   LABSPEC 1.008 05/09/2020 1424   PHURINE 6.0 05/09/2020 1424   GLUCOSEU NEGATIVE 05/09/2020 1424   HGBUR SMALL (A) 05/09/2020 1424   BILIRUBINUR NEGATIVE 05/09/2020 1424   BILIRUBINUR negative 04/14/2016 1456   KETONESUR NEGATIVE 05/09/2020 1424   PROTEINUR 100 (A) 05/09/2020 1424   UROBILINOGEN 0.2 04/14/2016 1456   NITRITE POSITIVE (A) 05/09/2020 1424   LEUKOCYTESUR LARGE (A) 05/09/2020 1424     Radiological Exams on Admission: DG Chest Portable 1 View  Result Date: 05/09/2020 CLINICAL DATA:  Cough, fever. EXAM: PORTABLE CHEST 1 VIEW COMPARISON:  April 08, 2020. FINDINGS: The heart size and mediastinal contours are within normal limits. No pneumothorax or pleural effusion is noted. Right lung is clear. Minimal left basilar subsegmental atelectasis or scarring is noted. The visualized skeletal structures are unremarkable. IMPRESSION: Minimal left basilar subsegmental atelectasis or scarring. Electronically Signed   By: Marijo Conception M.D.   On: 05/09/2020 14:50    Assessment/Plan  Principal Problem Sepsis due to urinary tract infection  -blood cultures and urine cultures have been sent, patient was placed on  meropenem, monitor  Active Problems History of bladder cancer, ileal conduit, recent nephrolithiasis with hydronephrosis and percutaneous nephrostomy tube placement 3 weeks ago -CT scan of the abdomen and pelvis pending, continue  broad-spectrum antibiotics, urology consulted  Chronic kidney disease stage IIIa -Baseline creatinine 2020 around 1.3, however in 2021 his creatinine has been in the 2 range and on discharge was 1.97 on 10/5.  Currently creatinine appears to be close to his baseline at 1.7.  Insulin-dependent diabetes mellitus -Placed on sliding scale, currently not taking his Lantus  History of bacteremia -Monitor blood cultures  History of CVA -Patient is not taking aspirin currently  Hyperlipidemia -Continue statin  DVT prophylaxis: SCDs, unclear whether urology has plans for intervention  Code Status: Full code  Family Communication: wife at bedside  Disposition Plan: home when ready  Bed Type: medsurg Consults called: Urology, ID  Obs/Inp: Obs   Marzetta Board, MD, PhD [facility]  Contact via [email or web address]  05/09/2020, 5:08 PM

## 2020-05-09 NOTE — Progress Notes (Signed)
PHARMACY NOTE:  ANTIMICROBIAL RENAL DOSAGE ADJUSTMENT  Current antimicrobial regimen includes a mismatch between antimicrobial dosage and estimated renal function.  As per policy approved by the Pharmacy & Therapeutics and Medical Executive Committees, the antimicrobial dosage will be adjusted accordingly.  Current antimicrobial dosage:  Previously adjusted per ID RPh   Indication: pyelonephritis  Renal Function:   Estimated Creatinine Clearance: 33.4 mL/min (A) (by C-G formula based on SCr of 1.76 mg/dL (H)). []      On intermittent HD, scheduled: []      On CRRT    Antimicrobial dosage has been changed to:  meropenem 1 g iv q 12 hours  Additional comments: Will follow remotely  Thank you for allowing pharmacy to be a part of this patient's care.  Napoleon Form, [facility] 05/09/2020 4:43 PM

## 2020-05-09 NOTE — ED Triage Notes (Signed)
Pt arrived via walk in, c/o increasing weakness x2 days. And fever (102 at home) Pt states he recent admission for kidney stone, septic at the time. Went home on abx, finished course. Worsening sx now.

## 2020-05-10 ENCOUNTER — Other Ambulatory Visit: Payer: Self-pay

## 2020-05-10 DIAGNOSIS — Z7982 Long term (current) use of aspirin: Secondary | ICD-10-CM | POA: Diagnosis not present

## 2020-05-10 DIAGNOSIS — Z87891 Personal history of nicotine dependence: Secondary | ICD-10-CM | POA: Diagnosis not present

## 2020-05-10 DIAGNOSIS — N1831 Chronic kidney disease, stage 3a: Secondary | ICD-10-CM | POA: Diagnosis present

## 2020-05-10 DIAGNOSIS — A498 Other bacterial infections of unspecified site: Secondary | ICD-10-CM | POA: Diagnosis not present

## 2020-05-10 DIAGNOSIS — Z794 Long term (current) use of insulin: Secondary | ICD-10-CM | POA: Diagnosis not present

## 2020-05-10 DIAGNOSIS — N200 Calculus of kidney: Secondary | ICD-10-CM

## 2020-05-10 DIAGNOSIS — Z936 Other artificial openings of urinary tract status: Secondary | ICD-10-CM | POA: Diagnosis not present

## 2020-05-10 DIAGNOSIS — N136 Pyonephrosis: Secondary | ICD-10-CM | POA: Diagnosis present

## 2020-05-10 DIAGNOSIS — I129 Hypertensive chronic kidney disease with stage 1 through stage 4 chronic kidney disease, or unspecified chronic kidney disease: Secondary | ICD-10-CM | POA: Diagnosis present

## 2020-05-10 DIAGNOSIS — Z79899 Other long term (current) drug therapy: Secondary | ICD-10-CM | POA: Diagnosis not present

## 2020-05-10 DIAGNOSIS — Z9079 Acquired absence of other genital organ(s): Secondary | ICD-10-CM | POA: Diagnosis not present

## 2020-05-10 DIAGNOSIS — Z8551 Personal history of malignant neoplasm of bladder: Secondary | ICD-10-CM | POA: Diagnosis not present

## 2020-05-10 DIAGNOSIS — Z9221 Personal history of antineoplastic chemotherapy: Secondary | ICD-10-CM | POA: Diagnosis not present

## 2020-05-10 DIAGNOSIS — Z8616 Personal history of COVID-19: Secondary | ICD-10-CM | POA: Diagnosis not present

## 2020-05-10 DIAGNOSIS — E1122 Type 2 diabetes mellitus with diabetic chronic kidney disease: Secondary | ICD-10-CM | POA: Diagnosis present

## 2020-05-10 DIAGNOSIS — R509 Fever, unspecified: Secondary | ICD-10-CM | POA: Diagnosis not present

## 2020-05-10 DIAGNOSIS — N12 Tubulo-interstitial nephritis, not specified as acute or chronic: Secondary | ICD-10-CM | POA: Diagnosis not present

## 2020-05-10 DIAGNOSIS — E785 Hyperlipidemia, unspecified: Secondary | ICD-10-CM | POA: Diagnosis present

## 2020-05-10 DIAGNOSIS — A4152 Sepsis due to Pseudomonas: Secondary | ICD-10-CM | POA: Diagnosis present

## 2020-05-10 DIAGNOSIS — I2510 Atherosclerotic heart disease of native coronary artery without angina pectoris: Secondary | ICD-10-CM | POA: Diagnosis present

## 2020-05-10 DIAGNOSIS — A419 Sepsis, unspecified organism: Secondary | ICD-10-CM | POA: Diagnosis not present

## 2020-05-10 DIAGNOSIS — Z8673 Personal history of transient ischemic attack (TIA), and cerebral infarction without residual deficits: Secondary | ICD-10-CM | POA: Diagnosis not present

## 2020-05-10 LAB — CBC
HCT: 35.8 % — ABNORMAL LOW (ref 39.0–52.0)
Hemoglobin: 11.6 g/dL — ABNORMAL LOW (ref 13.0–17.0)
MCH: 30.3 pg (ref 26.0–34.0)
MCHC: 32.4 g/dL (ref 30.0–36.0)
MCV: 93.5 fL (ref 80.0–100.0)
Platelets: 166 10*3/uL (ref 150–400)
RBC: 3.83 MIL/uL — ABNORMAL LOW (ref 4.22–5.81)
RDW: 13.8 % (ref 11.5–15.5)
WBC: 15.5 10*3/uL — ABNORMAL HIGH (ref 4.0–10.5)
nRBC: 0 % (ref 0.0–0.2)

## 2020-05-10 LAB — COMPREHENSIVE METABOLIC PANEL
ALT: 12 U/L (ref 0–44)
AST: 12 U/L — ABNORMAL LOW (ref 15–41)
Albumin: 3 g/dL — ABNORMAL LOW (ref 3.5–5.0)
Alkaline Phosphatase: 62 U/L (ref 38–126)
Anion gap: 10 (ref 5–15)
BUN: 33 mg/dL — ABNORMAL HIGH (ref 8–23)
CO2: 17 mmol/L — ABNORMAL LOW (ref 22–32)
Calcium: 8.3 mg/dL — ABNORMAL LOW (ref 8.9–10.3)
Chloride: 106 mmol/L (ref 98–111)
Creatinine, Ser: 1.61 mg/dL — ABNORMAL HIGH (ref 0.61–1.24)
GFR, Estimated: 42 mL/min — ABNORMAL LOW (ref >60)
Glucose, Bld: 170 mg/dL — ABNORMAL HIGH (ref 70–99)
Potassium: 3.5 mmol/L (ref 3.5–5.1)
Sodium: 133 mmol/L — ABNORMAL LOW (ref 135–145)
Total Bilirubin: 0.5 mg/dL (ref 0.3–1.2)
Total Protein: 6.9 g/dL (ref 6.5–8.1)

## 2020-05-10 LAB — GLUCOSE, CAPILLARY
Glucose-Capillary: 133 mg/dL — ABNORMAL HIGH (ref 70–99)
Glucose-Capillary: 127 mg/dL — ABNORMAL HIGH (ref 70–99)
Glucose-Capillary: 147 mg/dL — ABNORMAL HIGH (ref 70–99)
Glucose-Capillary: 134 mg/dL — ABNORMAL HIGH (ref 70–99)

## 2020-05-10 NOTE — Consult Note (Addendum)
[place] Nurse Consult Note: Reason for Consult: Consult requested for sacrum/buttocks Wound type: Sacrum has red stage 1 pressure injury; 1X1cm Bilat lower buttocks with stage 2 pressure injuries; each is approx .2X.2X.1cm, red and dry with irregular edges; appearance is consistent with moisture and shear. Pressure Injury POA: Yes Topical treatment orders provided for bedside nurses to perform as follows to protect and promote healing as follows: Dressing procedure/placement/frequency: Foam dressing to protect and promote healing. Please re-consult if further assistance is needed.  Thank-you,  Julien Girt MSN, [place], [place], [place], [place]

## 2020-05-10 NOTE — Telephone Encounter (Signed)
Faxed back and confirmation along with form placed in bin for scan.

## 2020-05-10 NOTE — Telephone Encounter (Signed)
Signed and put in box to go up front. Signed:  Phil Hopie Pellegrin, MD           05/10/2020  

## 2020-05-10 NOTE — Plan of Care (Signed)

## 2020-05-10 NOTE — Progress Notes (Signed)
PROGRESS NOTE  Shane [name] NAT:[id number] DOB: 01/02/1936 DOA: 05/09/2020 PCP: Tammi Sou, MD   LOS: 0 days   Brief Narrative / Interim history: 84 y.o. male with medical history significant of bladder cancer status post ilial conduit, chronic kidney disease stage IIIa, CAD, HTN, HLD, recurrent UTIs, history of GI bleed, prior CVA, diabetes mellitus who came into the ER with complaints of generalized weakness profound over the last 24 hours.  He was also febrile to 102 on the day of admission.  Subjective / 24h Interval events: He was doing much better today, feels like his energy is back.  No abdominal pain, no nausea or vomiting.  Assessment & Plan: Principal Problem Sepsis due to urinary tract infection  -ID consulted, appreciate input.  He was started on meropenem, continue to monitor cultures. -Still febrile overnight last night with a T-max of 101.4  Active Problems History of bladder cancer, ileal conduit, recent nephrolithiasis with hydronephrosis and percutaneous nephrostomy tube placement 3 weeks ago -CT scan of the abdomen and pelvis on admission showed bilateral intrarenal stone including 15 mm left pelvic calculus, and no intrarenal or perinephric abscesses -Awaiting urology input post CT scan  Chronic kidney disease stage IIIa -Baseline creatinine 2020 around 1.3, however in 2021 his creatinine has been in the 2 range and on discharge was 1.97 on 10/5. -Creatinine at baseline, 1.6 this morning  Insulin-dependent diabetes mellitus -Placed on sliding scale, currently not taking his Lantus  CBG (last 3)  Recent Labs    05/08/20 1307 05/09/20 2102 05/10/20 0732  GLUCAP 119* 200* 133*    History of bacteremia -Monitor blood cultures  History of CVA -Patient is not taking aspirin currently  Hyperlipidemia -Continue statin  Scheduled Meds: . ferrous sulfate  325 mg Oral Q breakfast  . insulin aspart  0-9 Units Subcutaneous TID WC  .  pravastatin  20 mg Oral Daily   Continuous Infusions: . meropenem (MERREM) IV 1 g (05/10/20 0911)   PRN Meds:.acetaminophen **OR** acetaminophen, albuterol, ondansetron **OR** ondansetron (ZOFRAN) IV  Diet Orders (From admission, onward)    Start     Ordered   05/10/20 0001  Diet NPO time specified  Diet effective midnight        05/09/20 2215          DVT prophylaxis: SCDs Start: 05/09/20 2216     Code Status: Full Code  Family Communication: No family at bedside this morning  Status is: Observation  The patient will require care spanning > 2 midnights and should be moved to inpatient because: Ongoing diagnostic testing needed not appropriate for outpatient work up, IV treatments appropriate due to intensity of illness or inability to take PO and Inpatient level of care appropriate due to severity of illness  Dispo: The patient is from: Home              Anticipated d/c is to: Home              Anticipated d/c date is: 2 days              Patient currently is not medically stable to d/c.  Awaiting urology input, monitoring blood cultures while maintaining on intravenous antibiotics due to sepsis  Consultants:  Urology ID  Procedures:  None   Microbiology  Blood / urine cultures - pending  Antimicrobials: Meropenem 10/27 >>    Objective: Vitals:   05/09/20 2022 05/10/20 0108 05/10/20 0511 05/10/20 0908  BP: 128/69 108/60 102/63 99/65  Pulse: 77 79 63 70  Resp: 18 (!) 23 18 16   Temp: (!) 101.3 F (38.5 C) (!) 101.4 F (38.6 C) 98 F (36.7 C) 99.3 F (37.4 C)  TempSrc: Oral Oral Oral Oral  SpO2: 96% 96% 99% 95%  Weight:      Height:        Intake/Output Summary (Last 24 hours) at 05/10/2020 1133 Last data filed at 05/10/2020 0600 Gross per 24 hour  Intake 100 ml  Output 750 ml  Net -650 ml   Filed Weights   05/09/20 2022  Weight: 81.4 kg    Examination:  Constitutional: NAD Eyes: no scleral icterus ENMT: Mucous membranes are moist.  Neck:  normal, supple Respiratory: clear to auscultation bilaterally, no wheezing, no crackles. Normal respiratory effort.  Cardiovascular: Regular rate and rhythm, no murmurs / rubs / gallops. No LE edema. Good peripheral pulses Abdomen: non distended, no tenderness. Bowel sounds positive.  Ileostomy bag in place, percutaneous nephrostomy in place Musculoskeletal: no clubbing / cyanosis.  Skin: no rashes Neurologic: Nonfocal, equal strength  Data Reviewed: I have independently reviewed following labs and imaging studies   CBC: Recent Labs  Lab 05/08/20 1346 05/09/20 1358 05/10/20 0319  WBC 8.6 12.4* 15.5*  NEUTROABS  --  10.5*  --   HGB 12.4* 12.9* 11.6*  HCT 39.8 40.2 35.8*  MCV 96.4 95.9 93.5  PLT 201 201 660   Basic Metabolic Panel: Recent Labs  Lab 05/08/20 1346 05/09/20 1358 05/10/20 0319  NA 140 137 133*  K 4.9 3.8 3.5  CL 108 105 106  CO2 22 18* 17*  GLUCOSE 105* 199* 170*  BUN 41* 35* 33*  CREATININE 1.67* 1.76* 1.61*  CALCIUM 9.1 8.4* 8.3*   Liver Function Tests: Recent Labs  Lab 05/09/20 1358 05/10/20 0319  AST 16 12*  ALT 16 12  ALKPHOS 74 62  BILITOT 0.6 0.5  PROT 7.9 6.9  ALBUMIN 3.6 3.0*   Coagulation Profile: No results for input(s): INR, PROTIME in the last 168 hours. HbA1C: No results for input(s): HGBA1C in the last 72 hours. CBG: Recent Labs  Lab 05/08/20 1307 05/09/20 2102 05/10/20 0732  GLUCAP 119* 200* 133*    Recent Results (from the past 240 hour(s))  Culture, blood (routine x 2)     Status: None (Preliminary result)   Collection Time: 05/09/20  3:10 PM   Specimen: BLOOD  Result Value Ref Range Status   Specimen Description   Final    BLOOD RIGHT ANTECUBITAL Performed at [facility] [address] [place], [place] [address]    Special Requests   Final    BOTTLES DRAWN AEROBIC AND ANAEROBIC Blood Culture adequate volume Performed at [facility] [address] [place], [place]  [address]    Culture   Final    NO GROWTH < 24 HOURS Performed at [facility] [address] [place], [place] [address]    Report Status PENDING  Incomplete  Culture, blood (routine x 2)     Status: None (Preliminary result)   Collection Time: 05/09/20  3:25 PM   Specimen: BLOOD  Result Value Ref Range Status   Specimen Description   Final    BLOOD LEFT ANTECUBITAL Performed at [facility] [address] [place], [place] [address]    Special Requests   Final    BOTTLES DRAWN AEROBIC AND ANAEROBIC Blood Culture adequate volume Performed at [facility]  [address] [place], [place] [address]    Culture   Final    NO GROWTH < 24 HOURS Performed at [facility], [place] [address], [place], [place] [address]    Report Status PENDING  Incomplete  Respiratory Panel by RT PCR (Flu A&B, Covid) - Nasopharyngeal Swab     Status: None   Collection Time: 05/09/20  5:44 PM   Specimen: Nasopharyngeal Swab  Result Value Ref Range Status   SARS Coronavirus 2 by RT PCR NEGATIVE NEGATIVE Final    Comment: (NOTE) SARS-CoV-2 target nucleic acids are NOT DETECTED.  The SARS-CoV-2 RNA is generally detectable in upper respiratoy specimens during the acute phase of infection. The lowest concentration of SARS-CoV-2 viral copies this assay can detect is 131 copies/mL. A negative result does not preclude SARS-Cov-2 infection and should not be used as the sole basis for treatment or other patient management decisions. A negative result may occur with  improper specimen collection/handling, submission of specimen other than nasopharyngeal swab, presence of viral mutation(s) within the areas targeted by this assay, and inadequate number of viral copies (<131 copies/mL). A negative result must be combined with clinical observations, patient history, and epidemiological information. The expected result is Negative.  Fact Sheet for Patients:   PinkCheek.be  Fact Sheet for Healthcare Providers:  GravelBags.it  This test is no t yet approved or cleared by the [place] FDA and  has been authorized for detection and/or diagnosis of SARS-CoV-2 by FDA under an Emergency Use Authorization (EUA). This EUA will remain  in effect (meaning this test can be used) for the duration of the COVID-19 declaration under Section 564(b)(1) of the Act, 21 U.S.C. section 360bbb-3(b)(1), unless the authorization is terminated or revoked sooner.     Influenza A by PCR NEGATIVE NEGATIVE Final   Influenza B by PCR NEGATIVE NEGATIVE Final    Comment: (NOTE) The Xpert Xpress SARS-CoV-2/FLU/RSV assay is intended as an aid in  the diagnosis of influenza from Nasopharyngeal swab specimens and  should not be used as a sole basis for treatment. Nasal washings and  aspirates are unacceptable for Xpert Xpress SARS-CoV-2/FLU/RSV  testing.  Fact Sheet for Patients: PinkCheek.be  Fact Sheet for Healthcare Providers: GravelBags.it  This test is not yet approved or cleared by the [place] FDA and  has been authorized for detection and/or diagnosis of SARS-CoV-2 by  FDA under an Emergency Use Authorization (EUA). This EUA will remain  in effect (meaning this test can be used) for the duration of the  Covid-19 declaration under Section 564(b)(1) of the Act, 21  U.S.C. section 360bbb-3(b)(1), unless the authorization is  terminated or revoked. Performed at [facility], [place] [address], [place], [place] [address]      Radiology Studies: CT ABDOMEN PELVIS W CONTRAST  Result Date: 05/09/2020 CLINICAL DATA:  Fever weakness EXAM: CT ABDOMEN AND PELVIS WITH CONTRAST TECHNIQUE: Multidetector CT imaging of the abdomen and pelvis was performed using the standard protocol following bolus administration of intravenous  contrast. CONTRAST:  46mL OMNIPAQUE IOHEXOL 300 MG/ML  SOLN COMPARISON:  CT 04/15/2020, 10/09/2017, 12/19/2017, MRI 04/11/2020 FINDINGS: Lower chest: Lung bases demonstrate mild bronchiectasis in the right greater than left lower lobe. No acute consolidation or effusion. Coronary vascular calcification. Dense mitral calcification. Hepatobiliary: No focal liver abnormality is seen. No gallstones, gallbladder wall thickening, or biliary dilatation. Pancreas: Atrophic with diffuse calcifications consistent with chronic pancreatitis. 13 mm hypodense lesion at the head of pancreas. Spleen: Normal in size without  focal abnormality. Adrenals/Urinary Tract: Stable right adrenal calcification. No adrenal mass. Nonspecific perinephric fat stranding. Status post percutaneous left nephrostomy. No significant hydronephrosis. Intrarenal stones bilaterally, including left renal pelvis calculus measuring up to 15 mm. Trace right renal collecting system dilatation without change. No definitive ureteral stones. Status post cystectomy with right lower quadrant ileal conduit. Small portion of conduit extends into a umbilical hernia. Stomach/Bowel: Stomach unremarkable. Scattered fluid-filled small bowel without distention or bowel wall thickening. Right lower quadrant ileal conduit. Possible small parastomal hernia near the ileal conduit containing loop of small bowel. Slight air in fluid distension of the bowel upstream to this. No colon wall thickening. Negative appendix. Vascular/Lymphatic: Extensive aortic atherosclerosis. No aneurysm. Subcentimeter retroperitoneal lymph nodes. Reproductive: Status post prostatectomy. Other: No free air. Trace fluid in the left gutter. Mild perinephric edema and fluid on the left without significant change. Musculoskeletal: No acute or suspicious osseous abnormality IMPRESSION: 1. Status post cystoprostatectomy with right lower quadrant ileal conduit. Status post left percutaneous nephrostomy with  decompressed left renal collecting system. Nonspecific bilateral perinephric fat stranding. Bilateral intrarenal stones, including 15 mm left pelvic calculus. No ureteral stone. Negative for intrarenal or perinephric abscess. 2. Possible small parastomal hernia at the right lower quadrant ileal conduit, containing segment of small bowel. Fluid-filled bowel upstream to this but without definitive evidence for an obstruction. 3. Findings consistent with chronic pancreatitis. Stable 13 mm hypodense pancreatic lesion at the head of pancreas Electronically Signed   By: Donavan Foil M.D.   On: 05/09/2020 18:18   DG Chest Portable 1 View  Result Date: 05/09/2020 CLINICAL DATA:  Cough, fever. EXAM: PORTABLE CHEST 1 VIEW COMPARISON:  April 08, 2020. FINDINGS: The heart size and mediastinal contours are within normal limits. No pneumothorax or pleural effusion is noted. Right lung is clear. Minimal left basilar subsegmental atelectasis or scarring is noted. The visualized skeletal structures are unremarkable. IMPRESSION: Minimal left basilar subsegmental atelectasis or scarring. Electronically Signed   By: Marijo Conception M.D.   On: 05/09/2020 14:50    Marzetta Board, MD, PhD [facility]  Between 7 am - 7 pm I am available, please contact me via Amion or Securechat  Between 7 pm - 7 am I am not available, please contact night coverage MD/APP via Amion

## 2020-05-10 NOTE — Progress Notes (Signed)
Received patient from ER via stretcher.  A&Ox4.  RLQ Ileal conduit, stoma pink and intact.  Left nephrostomy drain intact with clear yellow drainage.  Bruising noted to bilateral arms and diabetic sensor to left upper arm.  Heels dry and cracked and pressure injury to left buttocks approx 1x1 cm.  Surrounding area reddened.  Wound care consult ordered.  Will continue to monitor.

## 2020-05-10 NOTE — Progress Notes (Signed)
Shane [name] for Infectious Disease    Date of Admission:  05/09/2020   Total days of antibiotics 2 meropenem    ID: Shane [name] is a 84 y.o. male with presumed pyelo/urinary source with retained kidney stone Active Problems:   Sepsis ([place])    Subjective: Feels better,now afebrile this morning  Medications:  . ferrous sulfate  325 mg Oral Q breakfast  . insulin aspart  0-9 Units Subcutaneous TID WC  . pravastatin  20 mg Oral Daily    Objective: Vital signs in last 24 hours: Temp:  [98 F (36.7 C)-101.4 F (38.6 C)] 99.4 F (37.4 C) (10/28 1301) Pulse Rate:  [63-101] 68 (10/28 1301) Resp:  [13-23] 13 (10/28 1301) BP: (99-133)/(60-73) 109/63 (10/28 1301) SpO2:  [92 %-99 %] 96 % (10/28 1301) Weight:  [81.4 kg] 81.4 kg (10/27 2022) Physical Exam  Constitutional: He is oriented to person, place, and time. He appears well-developed and well-nourished. No distress.  HENT:  Mouth/Throat: Oropharynx is clear and moist. No oropharyngeal exudate.  Cardiovascular: Normal rate, regular rhythm and normal heart sounds. Exam reveals no gallop and no friction rub.  No murmur heard.  Pulmonary/Chest: Effort normal and breath sounds normal. No respiratory distress. He has no wheezes.  Abdominal: Soft. Bowel sounds are normal. He exhibits no distension. There is no tenderness. Urostomy in place. Slightly cloudy urine from left nephrostomy Neurological: He is alert and oriented to person, place, and time.  Skin: Skin is warm and dry. No rash noted. No erythema.  Psychiatric: He has a normal mood and affect. His behavior is normal.     Lab Results Recent Labs    05/09/20 1358 05/10/20 0319  WBC 12.4* 15.5*  HGB 12.9* 11.6*  HCT 40.2 35.8*  NA 137 133*  K 3.8 3.5  CL 105 106  CO2 18* 17*  BUN 35* 33*  CREATININE 1.76* 1.61*   Liver Panel Recent Labs    05/09/20 1358 05/10/20 0319  PROT 7.9 6.9  ALBUMIN 3.6 3.0*  AST 16 12*  ALT 16 12  ALKPHOS 74 62   BILITOT 0.6 0.5    Microbiology: Urine cx PsA Blood cx PENDING Studies/Results: CT ABDOMEN PELVIS W CONTRAST  Result Date: 05/09/2020 CLINICAL DATA:  Fever weakness EXAM: CT ABDOMEN AND PELVIS WITH CONTRAST TECHNIQUE: Multidetector CT imaging of the abdomen and pelvis was performed using the standard protocol following bolus administration of intravenous contrast. CONTRAST:  25mL OMNIPAQUE IOHEXOL 300 MG/ML  SOLN COMPARISON:  CT 04/15/2020, 10/09/2017, 12/19/2017, MRI 04/11/2020 FINDINGS: Lower chest: Lung bases demonstrate mild bronchiectasis in the right greater than left lower lobe. No acute consolidation or effusion. Coronary vascular calcification. Dense mitral calcification. Hepatobiliary: No focal liver abnormality is seen. No gallstones, gallbladder wall thickening, or biliary dilatation. Pancreas: Atrophic with diffuse calcifications consistent with chronic pancreatitis. 13 mm hypodense lesion at the head of pancreas. Spleen: Normal in size without focal abnormality. Adrenals/Urinary Tract: Stable right adrenal calcification. No adrenal mass. Nonspecific perinephric fat stranding. Status post percutaneous left nephrostomy. No significant hydronephrosis. Intrarenal stones bilaterally, including left renal pelvis calculus measuring up to 15 mm. Trace right renal collecting system dilatation without change. No definitive ureteral stones. Status post cystectomy with right lower quadrant ileal conduit. Small portion of conduit extends into a umbilical hernia. Stomach/Bowel: Stomach unremarkable. Scattered fluid-filled small bowel without distention or bowel wall thickening. Right lower quadrant ileal conduit. Possible small parastomal hernia near the ileal conduit containing loop of small bowel. Slight air in  fluid distension of the bowel upstream to this. No colon wall thickening. Negative appendix. Vascular/Lymphatic: Extensive aortic atherosclerosis. No aneurysm. Subcentimeter retroperitoneal  lymph nodes. Reproductive: Status post prostatectomy. Other: No free air. Trace fluid in the left gutter. Mild perinephric edema and fluid on the left without significant change. Musculoskeletal: No acute or suspicious osseous abnormality IMPRESSION: 1. Status post cystoprostatectomy with right lower quadrant ileal conduit. Status post left percutaneous nephrostomy with decompressed left renal collecting system. Nonspecific bilateral perinephric fat stranding. Bilateral intrarenal stones, including 15 mm left pelvic calculus. No ureteral stone. Negative for intrarenal or perinephric abscess. 2. Possible small parastomal hernia at the right lower quadrant ileal conduit, containing segment of small bowel. Fluid-filled bowel upstream to this but without definitive evidence for an obstruction. 3. Findings consistent with chronic pancreatitis. Stable 13 mm hypodense pancreatic lesion at the head of pancreas Electronically Signed   By: Donavan Foil M.D.   On: 05/09/2020 18:18   DG Chest Portable 1 View  Result Date: 05/09/2020 CLINICAL DATA:  Cough, fever. EXAM: PORTABLE CHEST 1 VIEW COMPARISON:  April 08, 2020. FINDINGS: The heart size and mediastinal contours are within normal limits. No pneumothorax or pleural effusion is noted. Right lung is clear. Minimal left basilar subsegmental atelectasis or scarring is noted. The visualized skeletal structures are unremarkable. IMPRESSION: Minimal left basilar subsegmental atelectasis or scarring. Electronically Signed   By: Marijo Conception M.D.   On: 05/09/2020 14:50     Assessment/Plan: Sepsis from urinary source/pyelo = concern it is related to kidney stones which is slated from lithotripsy next week.plan to follow up blood cx tomorrow, if no growth then would place picc line to treat for PsA pyelonephritis- no oral options.possibly can change to cefepime if enterococcus is not isolated  Leukocytosis = will recommend to follow cbc tomorrow to see it trends  down  ckd 3= will renally dose abtx  [facility] for Infectious Diseases Cell: [phone] Pager: [phone]  05/10/2020, 4:01 PM

## 2020-05-11 ENCOUNTER — Inpatient Hospital Stay: Payer: Self-pay

## 2020-05-11 DIAGNOSIS — N12 Tubulo-interstitial nephritis, not specified as acute or chronic: Secondary | ICD-10-CM

## 2020-05-11 LAB — CBC
HCT: 34.7 % — ABNORMAL LOW (ref 39.0–52.0)
Hemoglobin: 11.4 g/dL — ABNORMAL LOW (ref 13.0–17.0)
MCH: 30.6 pg (ref 26.0–34.0)
MCHC: 32.9 g/dL (ref 30.0–36.0)
MCV: 93.3 fL (ref 80.0–100.0)
Platelets: 185 10*3/uL (ref 150–400)
RBC: 3.72 MIL/uL — ABNORMAL LOW (ref 4.22–5.81)
RDW: 13.9 % (ref 11.5–15.5)
WBC: 10.9 10*3/uL — ABNORMAL HIGH (ref 4.0–10.5)
nRBC: 0 % (ref 0.0–0.2)

## 2020-05-11 LAB — BASIC METABOLIC PANEL
Anion gap: 10 (ref 5–15)
BUN: 38 mg/dL — ABNORMAL HIGH (ref 8–23)
CO2: 17 mmol/L — ABNORMAL LOW (ref 22–32)
Calcium: 8.3 mg/dL — ABNORMAL LOW (ref 8.9–10.3)
Chloride: 106 mmol/L (ref 98–111)
Creatinine, Ser: 1.81 mg/dL — ABNORMAL HIGH (ref 0.61–1.24)
GFR, Estimated: 37 mL/min — ABNORMAL LOW (ref >60)
Glucose, Bld: 127 mg/dL — ABNORMAL HIGH (ref 70–99)
Potassium: 4 mmol/L (ref 3.5–5.1)
Sodium: 133 mmol/L — ABNORMAL LOW (ref 135–145)

## 2020-05-11 LAB — URINE CULTURE: Culture: >=100000 — AB

## 2020-05-11 LAB — GLUCOSE, CAPILLARY
Glucose-Capillary: 139 mg/dL — ABNORMAL HIGH (ref 70–99)
Glucose-Capillary: 163 mg/dL — ABNORMAL HIGH (ref 70–99)

## 2020-05-11 MED ORDER — SACCHAROMYCES BOULARDII 250 MG PO CAPS
250.0000 mg | ORAL_CAPSULE | Freq: Two times a day (BID) | ORAL | 0 refills | Status: DC
Start: 2020-05-11 — End: 2020-09-03

## 2020-05-11 MED ORDER — CEFEPIME IV (FOR PTA / DISCHARGE USE ONLY): 2.0000 g | INTRAVENOUS | 0 refills | Status: AC

## 2020-05-11 MED ORDER — SODIUM CHLORIDE 0.9% FLUSH: 10.0000 mL | INTRAVENOUS | Status: DC | PRN

## 2020-05-11 MED ORDER — CHLORHEXIDINE GLUCONATE CLOTH 2 % EX PADS: 6.0000 | MEDICATED_PAD | Freq: Every day | CUTANEOUS | Status: DC

## 2020-05-11 MED ORDER — SODIUM CHLORIDE 0.9 % IV SOLN
2.0000 g | INTRAVENOUS | Status: DC
  Administered 2020-05-11: 2 g via INTRAVENOUS
  Filled 2020-05-11: qty 2

## 2020-05-11 MED ORDER — SODIUM CHLORIDE 0.9 % IV SOLN: INTRAVENOUS | Status: DC

## 2020-05-11 MED ORDER — SACCHAROMYCES BOULARDII 250 MG PO CAPS
250.0000 mg | ORAL_CAPSULE | Freq: Two times a day (BID) | ORAL | Status: DC
  Administered 2020-05-11: 250 mg via ORAL
  Filled 2020-05-11: qty 1

## 2020-05-11 MED ORDER — SODIUM CHLORIDE 0.9% FLUSH: 10.0000 mL | Freq: Two times a day (BID) | INTRAVENOUS | Status: DC

## 2020-05-11 NOTE — Progress Notes (Signed)
PHARMACY CONSULT NOTE FOR:  OUTPATIENT  PARENTERAL ANTIBIOTIC THERAPY (OPAT)  Indication: UTI Regimen: Cefepime 2g IV q24h End date: 05/23/2020  IV antibiotic discharge orders are pended. To discharging provider:  please sign these orders via discharge navigator,  Select New Orders & click on the button choice - Manage This Unsigned Work.     Thank you for allowing pharmacy to be a part of this patient's care.  Phillis Haggis 05/11/2020, 12:58 PM

## 2020-05-11 NOTE — Progress Notes (Signed)
Patient blood sugar is 120 by fingerstick. Did not cross over due to machine cutting off while checking CBG. Patient tolerated well.

## 2020-05-11 NOTE — Progress Notes (Signed)
Peripherally Inserted Central Catheter Placement  The IV Nurse has discussed with the patient and/or persons authorized to consent for the patient, the purpose of this procedure and the potential benefits and risks involved with this procedure.  The benefits include less needle sticks, lab draws from the catheter, and the patient may be discharged home with the catheter. Risks include, but not limited to, infection, bleeding, blood clot (thrombus formation), and puncture of an artery; nerve damage and irregular heartbeat and possibility to perform a PICC exchange if needed/ordered by physician.  Alternatives to this procedure were also discussed.  Bard Power PICC patient education guide, fact sheet on infection prevention and patient information card has been provided to patient /or left at bedside.    PICC Placement Documentation  PICC Single Lumen 13/08/65 PICC Right Basilic 40 cm 0 cm (Active)  Indication for Insertion or Continuance of Line Home intravenous therapies (PICC only) 05/11/20 1550  Exposed Catheter (cm) 0 cm 05/11/20 1550  Site Assessment Clean;Dry;Intact 05/11/20 1550  Line Status Flushed;Saline locked;Blood return noted 05/11/20 1550  Dressing Type Transparent;Securing device 05/11/20 1550  Dressing Status Clean;Dry;Intact 05/11/20 1550  Antimicrobial disc in place? Yes 05/11/20 1550  Safety Lock Not Applicable 78/46/96 2952  Dressing Intervention New dressing 05/11/20 1550  Dressing Change Due 05/18/20 05/11/20 [place], Antoinne Spadaccini 05/11/2020, 4:10 PM

## 2020-05-11 NOTE — TOC Transition Note (Addendum)
Transition of Care [facility]) - CM/SW Discharge Note   Patient Details  Name: Shane [name] MRN: [id number] Date of Birth: 22-Jun-1936  [facility] CM/SW Contact:  Ross Ludwig, LCSW Phone Number: 05/11/2020, 2:05 PM   Clinical Narrative:     Patient is currently active with [facility] for [facility] health.  Patient will be receiving a HH RN.  Patient also will be going home with home IV antibiotics.  [facility] is aware of patient discharging today.  CSW has set up home health and IV antibiotic referral.  CSW spoke to [facility] at [facility], and patient is set up for home IV antibiotics and home health will be able to see patient tomorrow.   Final next level of care: [place] Barriers to Discharge: Barriers Resolved   Patient Goals and CMS Choice Patient states their goals for this hospitalization and ongoing recovery are:: To return back home with home health RN and IV antibiotics.      Discharge Placement                       Discharge Plan and Services                          HH Arranged: RN [facility] Agency: [place] Date [facility] Agency Contacted: 05/11/20 Time [place]: [phone] Representative spoke with at [place]: [place]) Interventions     Readmission Risk Interventions Readmission Risk Prevention Plan 04/16/2020  Transportation Screening Complete  PCP or Specialist Appt within 3-5 Days Complete  HRI or [place] Patient refused  Social Work Consult for [place] Planning/Counseling Complete  Palliative Care Screening Not Applicable  Some recent data might be hidden

## 2020-05-11 NOTE — Progress Notes (Addendum)
[facility]    Date of Admission:  05/09/2020   Total days of antibiotics 3/meropenem           ID: Shane [name] is a 84 y.o. male with sepsis of urinary source, pyelonephritis with recurrent PsA infection. No bacteremia at this time. Still has retained stone Active Problems:   Sepsis ([place])    Subjective: Afebrile, no chills. Getting picc line placed at bedside  Medications:  . Chlorhexidine Gluconate Cloth  6 each Topical Daily  . ferrous sulfate  325 mg Oral Q breakfast  . insulin aspart  0-9 Units Subcutaneous TID WC  . pravastatin  20 mg Oral Daily  . saccharomyces boulardii  250 mg Oral BID  . sodium chloride flush  10-40 mL Intracatheter Q12H    Objective: Vital signs in last 24 hours: Temp:  [98.9 F (37.2 C)-99.1 F (37.3 C)] 98.9 F (37.2 C) (10/29 1238) Pulse Rate:  [57-66] 66 (10/29 1238) Resp:  [15-20] 15 (10/29 1238) BP: (106-109)/(69-73) 108/70 (10/29 1238) SpO2:  [97 %] 97 % (10/29 1238) Gen= laying in bed comfortably, consenting to procedure Ext= prepped and draped for procedure   Lab Results Recent Labs    05/10/20 0319 05/11/20 0346  WBC 15.5* 10.9*  HGB 11.6* 11.4*  HCT 35.8* 34.7*  NA 133* 133*  K 3.5 4.0  CL 106 106  CO2 17* 17*  BUN 33* 38*  CREATININE 1.61* 1.81*   Liver Panel Recent Labs    05/09/20 1358 05/10/20 0319  PROT 7.9 6.9  ALBUMIN 3.6 3.0*  AST 16 12*  ALT 16 12  ALKPHOS 74 62  BILITOT 0.6 0.5   Sedimentation Rate No results for input(s): ESRSEDRATE in the last 72 hours. C-Reactive Protein No results for input(s): CRP in the last 72 hours.  Microbiology: 10/27 blood cx ngtd 10/27 urine cx PsA eptibility   Pseudomonas aeruginosa    MIC    CEFEPIME 2 SENSITIVE  Sensitive    CEFTAZIDIME 4 SENSITIVE  Sensitive    CIPROFLOXACIN 1 SENSITIVE  Sensitive    GENTAMICIN 2 SENSITIVE  Sensitive    IMIPENEM 1 SENSITIVE  Sensitive    PIP/TAZO 8 SENSITIVE  Sensitive      Studies/Results: CT ABDOMEN PELVIS W CONTRAST  Result Date: 05/09/2020 CLINICAL DATA:  Fever weakness EXAM: CT ABDOMEN AND PELVIS WITH CONTRAST TECHNIQUE: Multidetector CT imaging of the abdomen and pelvis was performed using the standard protocol following bolus administration of intravenous contrast. CONTRAST:  63mL OMNIPAQUE IOHEXOL 300 MG/ML  SOLN COMPARISON:  CT 04/15/2020, 10/09/2017, 12/19/2017, MRI 04/11/2020 FINDINGS: Lower chest: Lung bases demonstrate mild bronchiectasis in the right greater than left lower lobe. No acute consolidation or effusion. Coronary vascular calcification. Dense mitral calcification. Hepatobiliary: No focal liver abnormality is seen. No gallstones, gallbladder wall thickening, or biliary dilatation. Pancreas: Atrophic with diffuse calcifications consistent with chronic pancreatitis. 13 mm hypodense lesion at the head of pancreas. Spleen: Normal in size without focal abnormality. Adrenals/Urinary Tract: Stable right adrenal calcification. No adrenal mass. Nonspecific perinephric fat stranding. Status post percutaneous left nephrostomy. No significant hydronephrosis. Intrarenal stones bilaterally, including left renal pelvis calculus measuring up to 15 mm. Trace right renal collecting system dilatation without change. No definitive ureteral stones. Status post cystectomy with right lower quadrant ileal conduit. Small portion of conduit extends into a umbilical hernia. Stomach/Bowel: Stomach unremarkable. Scattered fluid-filled small bowel without distention or bowel wall thickening. Right lower quadrant ileal conduit. Possible small parastomal hernia  near the ileal conduit containing loop of small bowel. Slight air in fluid distension of the bowel upstream to this. No colon wall thickening. Negative appendix. Vascular/Lymphatic: Extensive aortic atherosclerosis. No aneurysm. Subcentimeter retroperitoneal lymph nodes. Reproductive: Status post prostatectomy. Other: No free  air. Trace fluid in the left gutter. Mild perinephric edema and fluid on the left without significant change. Musculoskeletal: No acute or suspicious osseous abnormality IMPRESSION: 1. Status post cystoprostatectomy with right lower quadrant ileal conduit. Status post left percutaneous nephrostomy with decompressed left renal collecting system. Nonspecific bilateral perinephric fat stranding. Bilateral intrarenal stones, including 15 mm left pelvic calculus. No ureteral stone. Negative for intrarenal or perinephric abscess. 2. Possible small parastomal hernia at the right lower quadrant ileal conduit, containing segment of small bowel. Fluid-filled bowel upstream to this but without definitive evidence for an obstruction. 3. Findings consistent with chronic pancreatitis. Stable 13 mm hypodense pancreatic lesion at the head of pancreas Electronically Signed   By: Donavan Foil M.D.   On: 05/09/2020 18:18   [place] EKG SITE RITE  Result Date: 05/11/2020 If Site Rite image not attached, placement could not be confirmed due to current cardiac rhythm.    Assessment/Plan: Sepsis from complicated uti/pyelonephritis with retained urinary stone = will narrow abtx to cefepime and treat for total of 14 day which should cover him through his upcoming urologic procedure-lithotripsy to remove stone which I suspect is the nidus of his reinfection. Please do not delay his upcoming procedure. He should be covered from abtx standpoint. Plan to pull picc line at end of abtx course  abtx through 11/09  Leukocytosis = improved and back to baseline  Will see back in the ID clinic in 2-3 wk  [facility] Cell: [phone] Pager: [phone]  05/11/2020, 4:11 PM

## 2020-05-11 NOTE — Discharge Summary (Addendum)
Physician Discharge Summary  Shane [name] FIE:[id number] DOB: 1935-08-22 DOA: 05/09/2020  PCP: Shane Sou, MD  Admit date: 05/09/2020 Discharge date: 05/11/2020  Admitted From: home Disposition:  home  Recommendations for Outpatient Follow-up:  1. Follow up with Dr Shane [name] in 1 week for lithotripsy  2. Patient discharged with a PICC line and cefepime for 12 additional days  Home Health: RN Equipment/Devices: None  Discharge Condition: Stable CODE STATUS: Full code Diet recommendation: Regular diet  HPI: Per admitting MD, Shane [name] is a 84 y.o. male with medical history significant of bladder cancer status post ilial conduit, chronic kidney disease stage IIIa, CAD, HTN, HLD, recurrent UTIs, history of GI bleed, prior CVA, diabetes mellitus who came into the ER with complaints of generalized weakness profound over the last 24 hours.  Wife also reports a fever of 102 this morning.  He was recently hospitalized and discharged on 04/17/20 for UTI with Enterococcus faecalis and Pseudomonas bacteremia, ID was consulted.  He also was found to have a large left renal pelvis stone 10 x 6 mm which was causing hydronephrosis and pyelonephritis.  Urology was consulted and eventually patient underwent IR placement of left percutaneous nephrostomy tube on 10/1.  He eventually improved and was discharged home on 10/5 with intravenous Zosyn which was done on 10/11.  Patient has been feeling well up until this morning, when his main complaint was weakness.  He denies any chest pain, denies any shortness of breath.  He denies any abdominal pain, nausea or vomiting.  Due to the fever and weakness they called urology office and he was directed to come to the ER.  Hospital Course / Discharge diagnoses: Principal Problem Sepsis due to urinary tract infection-this is likely in the setting of nephrolithiasis, has had recurrent infection while being off antibiotics.  Infectious disease  followed patient while hospitalized last time, they were consulted again as well during this hospital stay.  He was initially started on meropenem out of concern for drug-resistant bacteria, his urine cultures speciated Pseudomonas, he was transitioned to cefepime.  His blood cultures have remained negative, he had a PICC line placed and was discharged home in stable condition with a total of 14-day course.  His sepsis physiology resolved and his white count has improved.  Active Problems History of bladder cancer, ileal conduit, recent nephrolithiasis with hydronephrosis and percutaneous nephrostomy tube placement 3 weeks ago -CT scan of the abdomen and pelvis on admission showed bilateral intrarenal stone including 15 mm left pelvic calculus, and no intrarenal or perinephric abscesses.  Urology consulted, discussed with Dr. Tresa [name] over the phone, plans are in place for patient to get lithotripsy next week.  He will be on antibiotics prior to that and afterwards as per ID Chronic kidney disease stage IIIa-Baseline creatinine 2020 around 1.3, however in 2021 his creatinine has been in the 2 range and on discharge was 1.97 on 10/5.  His creatinine is within his acceptable baseline. Insulin-dependent diabetes mellitus -resume home medications History of bacteremia -Monitor blood cultures, have remained negative at the time of discharge History of CVA -resume home medications Hyperlipidemia -Continue statin  Discharge Instructions  Discharge Instructions    [facility] pharmacist to adjust dose for Vancomycin, Aminoglycosides and other anti-infective therapies as requested by physician.   Complete by: As directed    [facility] to provide Cath Flo $Remove'2mg'MzCpJCg$    Complete by: As directed    Administer for PICC line occlusion and as ordered by  physician for other access device issues.   Anaphylaxis Kit: Provided to treat any anaphylactic reaction to the medication being provided to the  patient if First Dose or when requested by physician   Complete by: As directed    Epinephrine 1mg /ml vial / amp: Administer 0.3mg  (0.59ml) subcutaneously once for moderate to severe anaphylaxis, nurse to call physician and pharmacy when reaction occurs and call 911 if needed for immediate care   Diphenhydramine 50mg /ml IV vial: Administer 25-50mg  IV/IM PRN for first dose reaction, rash, itching, mild reaction, nurse to call physician and pharmacy when reaction occurs   Sodium Chloride 0.9% NS 593ml IV: Administer if needed for hypovolemic blood pressure drop or as ordered by physician after call to physician with anaphylactic reaction   Change dressing on IV access line weekly and PRN   Complete by: As directed    Flush IV access with Sodium Chloride 0.9% and Heparin 10 units/ml or 100 units/ml   Complete by: As directed    Home infusion instructions - [facility]   Complete by: As directed    Instructions: Flush IV access with Sodium Chloride 0.9% and Heparin 10units/ml or 100units/ml   Change dressing on IV access line: Weekly and PRN   Instructions Cath Flo 2mg : Administer for PICC Line occlusion and as ordered by physician for other access device   [facility] pharmacist to adjust dose for: Vancomycin, Aminoglycosides and other anti-infective therapies as requested by physician   Method of administration may be changed at the discretion of home infusion pharmacist based upon assessment of the patient and/or caregiver's ability to self-administer the medication ordered   Complete by: As directed      Allergies as of 05/11/2020      Reactions   Other Swelling, Other (See Comments)   Pt states had swelling of the eye when having eye surgery and the anesthetic placed in eye- recovered the same day      Medication List    TAKE these medications   albuterol 108 (90 Base) MCG/ACT inhaler Commonly known as: VENTOLIN HFA Inhale 2 puffs into the lungs every 6 (six) hours  as needed.   aspirin EC 81 MG tablet Take 1 tablet (81 mg total) by mouth daily.   ceFEPime  IVPB Commonly known as: MAXIPIME Inject 2 g into the vein daily for 12 days. Indication:  UTI First Dose: No Last Day of Therapy:  05/23/2020 Labs - Once weekly:  CBC/D and BMP, Labs - Every other week:  ESR and CRP Method of administration: IV Push Method of administration may be changed at the discretion of home infusion pharmacist based upon assessment of the patient and/or caregiver's ability to self-administer the medication ordered.   cetirizine 10 MG tablet Commonly known as: ZYRTEC Take 1 tablet (10 mg total) by mouth daily.   Ferrous Sulfate 27 MG Tabs Take 27 mg by mouth daily with breakfast.   FISH OIL PO Take 1 capsule by mouth daily.   FreeStyle Libre 14 Day Sensor Misc Inject 1 patch into the skin every 14 (fourteen) days.   insulin aspart 100 UNIT/ML FlexPen Commonly known as: NovoLOG FlexPen CBG < 70: implement hypoglycemia protocol CBG 70 - 120: 3 units CBG 121 - 150: 5 units CBG 151 - 200: 6 units CBG 201 - 250: 8 units CBG 251 - 300: 11 units CBG 301 - 350: 14 units CBG 351 - 400: 18 units CBG > 400: call MD What changed:   how  much to take  how to take this  when to take this   Lancets 30G Misc Use to check blood sugar 4 times daily   Lantus SoloStar 100 UNIT/ML Solostar Pen Generic drug: insulin glargine INJECT 5 UNITS SUBQ AT BEDTIME   multivitamin with minerals Tabs tablet Take 1 tablet by mouth daily.   Pen Needles 32G X 4 MM Misc 1 each by Does not apply route daily.   Insulin Pen Needle 31G X 5 MM Misc Inject insulin via insulin pen 3 x daily with meals   pravastatin 20 MG tablet Commonly known as: PRAVACHOL TAKE 1 TABLET BY MOUTH EVERY DAY   saccharomyces boulardii 250 MG capsule Commonly known as: FLORASTOR Take 1 capsule (250 mg total) by mouth 2 (two) times daily.            Discharge Care Instructions  (From admission,  onward)         Start     Ordered   05/11/20 0000  Change dressing on IV access line weekly and PRN  (Home infusion instructions - [facility] )        05/11/20 1352         Consultations:  Urology  ID  Procedures/Studies:  CT ABDOMEN PELVIS WO CONTRAST  Result Date: 04/15/2020 CLINICAL DATA:  Hydronephrosis. EXAM: CT ABDOMEN AND PELVIS WITHOUT CONTRAST TECHNIQUE: Multidetector CT imaging of the abdomen and pelvis was performed following the standard protocol without IV contrast. COMPARISON:  December 19, 2017.  April 11, 2020. FINDINGS: Lower chest: No acute abnormality. Hepatobiliary: No focal liver abnormality is seen. No gallstones, gallbladder wall thickening, or biliary dilatation. Pancreas: Pancreatic calcifications are noted consistent with chronic pancreatitis. Some degree of atrophy is present as well. No definite acute abnormality is noted. Spleen: Normal in size without focal abnormality. Adrenals/Urinary Tract: Adrenal glands appear normal. Status post left percutaneous nephrostomy. Bilateral nephrolithiasis is noted. No hydronephrosis is noted. Status post cystectomy with ileal conduit seen in the right lower quadrant. Stomach/Bowel: Stomach is within normal limits. Appendix appears normal. No evidence of bowel wall thickening, distention, or inflammatory changes. Vascular/Lymphatic: Aortic atherosclerosis. No enlarged abdominal or pelvic lymph nodes. Reproductive: Status post prostatectomy. Other: No abdominal wall hernia or abnormality. No abdominopelvic ascites. Musculoskeletal: No acute or significant osseous findings. IMPRESSION: 1. Bilateral nephrolithiasis. No hydronephrosis is noted. Status post left percutaneous nephrostomy. 2. Status post cystectomy with ileal conduit seen in the right lower quadrant. 3. Pancreatic calcifications are noted consistent with chronic pancreatitis. 4. Aortic atherosclerosis. Aortic Atherosclerosis (ICD10-I70.0). Electronically Signed    By: Lupita Raider M.D.   On: 04/15/2020 10:21   MR ABDOMEN WO CONTRAST  Result Date: 04/12/2020 CLINICAL DATA:  Renal mass, renal insufficiency EXAM: MRI ABDOMEN WITHOUT CONTRAST TECHNIQUE: Multiplanar multisequence MR imaging was performed without the administration of intravenous contrast. COMPARISON:  CT of the abdomen and pelvis dated December 19, 2017 FINDINGS: Lower chest: No sign of consolidation or pleural effusion. Limited assessment of lung bases on MRI. Hepatobiliary: Limited assessment on noncontrast imaging of the liver. No visible lesion with suspicious features. No pericholecystic stranding or sign of biliary duct dilation. Pancreas: 13 mm lesion in the head of the pancreas with cystic features. Mild main duct distension grossly similar to previous imaging with ectatic side branches. The cystic lesion in the head of the pancreas is unchanged compared to Dec 04, 2018. Loss of normal intrinsic T1 hyperintensity within the pancreatic parenchyma is similar to previous imaging Spleen: Spleen normal in  size and contour without focal lesion. Limited assessment without contrast. Adrenals/Urinary Tract:  Adrenal glands are grossly normal. Moderate LEFT-sided hydroureteronephrosis. In the LEFT renal pelvis is an area of low T2 signal measuring approximately 10 x 6 mm. Extensive inflammatory changes surround the LEFT renal pelvis. There is distension of LEFT renal collecting systems as described, some of this distension showing intermediate rather than high T2 signal without high T1 signal and with signs of profound restricted diffusion. Mixed signal on diffusion is noted with a more central area in the renal sinus measuring approximately 3.1 x 0.9 cm displaying less diffusion than material that fills the peripheral collecting systems. Striated parenchymal signal on T2 weighted imaging and on diffusion RIGHT kidney with potential calculus in the lower pole best seen a susceptibility on image 25 of series 12.  This is nonspecific. Mild fullness of the RIGHT ureter without overt hydronephrosis in the setting of ileal conduit following cysto prostatectomy. Stomach/Bowel: Limited assessment of gastrointestinal structures. RIGHT lower quadrant ileal conduit ostomy. Small bowel, likely a portion of the conduit contained within a small ventral hernia is imaged only on coronal images, grossly unchanged accounting for incomplete visualization as compared to December 19, 2017. Study not performed for bowel evaluation and therefore with limited assessment of bowel. Vascular/Lymphatic: Vascular structures in the abdomen not well assessed with signs of atheromatous plaque. Mildly enlarged lymph nodes along the LEFT periaortic chain, largest 13 mm on image 66 of series 10. Other: Generalized mild body wall edema. No visible ascites. No perinephric collection. Musculoskeletal: No focal, suspicious bone lesion visualized. IMPRESSION: 1. Interval migration of a calculus (approximately 10 x 6 mm) from the peripheral collecting systems of the LEFT kidney into the renal pelvis with surrounding inflammation and associated moderate hydronephrosis. 2. Material filling calices and collecting system elements may represent tumor or infectious debris, urologic consultation with retrograde evaluation as warranted is suggested. Noncontrast CT could be performed for confirmation of renal pelvic calculus if desired. 3. Striated appearance of parenchyma on the LEFT on T2 weighted imaging suggests concomitant pyelonephritis, combined with central obstructing calculus as described. 4. Mild fullness of the RIGHT ureter without overt hydronephrosis in the setting of ileal conduit following cysto prostatectomy. 5. Mildly enlarged LEFT periaortic lymph nodes, nonspecific. 6. 13 mm cystic lesion in the head of the pancreas with cystic features. This is unchanged compared to previous imaging and may represent a side branch IPMN. Attention on follow-up. MRCP at 2  year interval could be performed as warranted for further assessment These results will be called to the ordering clinician or representative by the Radiologist Assistant, and communication documented in the PACS or [employer]. Electronically Signed   By: Donzetta Kohut M.D.   On: 04/12/2020 08:18   CT ABDOMEN PELVIS W CONTRAST  Result Date: 05/09/2020 CLINICAL DATA:  Fever weakness EXAM: CT ABDOMEN AND PELVIS WITH CONTRAST TECHNIQUE: Multidetector CT imaging of the abdomen and pelvis was performed using the standard protocol following bolus administration of intravenous contrast. CONTRAST:  74mL OMNIPAQUE IOHEXOL 300 MG/ML  SOLN COMPARISON:  CT 04/15/2020, 10/09/2017, 12/19/2017, MRI 04/11/2020 FINDINGS: Lower chest: Lung bases demonstrate mild bronchiectasis in the right greater than left lower lobe. No acute consolidation or effusion. Coronary vascular calcification. Dense mitral calcification. Hepatobiliary: No focal liver abnormality is seen. No gallstones, gallbladder wall thickening, or biliary dilatation. Pancreas: Atrophic with diffuse calcifications consistent with chronic pancreatitis. 13 mm hypodense lesion at the head of pancreas. Spleen: Normal in size without focal abnormality. Adrenals/Urinary Tract:  Stable right adrenal calcification. No adrenal mass. Nonspecific perinephric fat stranding. Status post percutaneous left nephrostomy. No significant hydronephrosis. Intrarenal stones bilaterally, including left renal pelvis calculus measuring up to 15 mm. Trace right renal collecting system dilatation without change. No definitive ureteral stones. Status post cystectomy with right lower quadrant ileal conduit. Small portion of conduit extends into a umbilical hernia. Stomach/Bowel: Stomach unremarkable. Scattered fluid-filled small bowel without distention or bowel wall thickening. Right lower quadrant ileal conduit. Possible small parastomal hernia near the ileal conduit containing loop of  small bowel. Slight air in fluid distension of the bowel upstream to this. No colon wall thickening. Negative appendix. Vascular/Lymphatic: Extensive aortic atherosclerosis. No aneurysm. Subcentimeter retroperitoneal lymph nodes. Reproductive: Status post prostatectomy. Other: No free air. Trace fluid in the left gutter. Mild perinephric edema and fluid on the left without significant change. Musculoskeletal: No acute or suspicious osseous abnormality IMPRESSION: 1. Status post cystoprostatectomy with right lower quadrant ileal conduit. Status post left percutaneous nephrostomy with decompressed left renal collecting system. Nonspecific bilateral perinephric fat stranding. Bilateral intrarenal stones, including 15 mm left pelvic calculus. No ureteral stone. Negative for intrarenal or perinephric abscess. 2. Possible small parastomal hernia at the right lower quadrant ileal conduit, containing segment of small bowel. Fluid-filled bowel upstream to this but without definitive evidence for an obstruction. 3. Findings consistent with chronic pancreatitis. Stable 13 mm hypodense pancreatic lesion at the head of pancreas Electronically Signed   By: Donavan Foil M.D.   On: 05/09/2020 18:18   IR Removal Tun Cv Cath W/O FL  Result Date: 05/04/2020 INDICATION: Patient with history of polymicrobial bacteremia due to renal stones/pyelonephritis; underwent placement of tunneled right internal jugular central venous catheter on 04/17/2020; he has completed treatment and request now received for central venous catheter removal EXAM: REMOVAL TUNNELED CENTRAL VENOUS CATHETER MEDICATIONS: None ANESTHESIA/SEDATION: None FLUOROSCOPY TIME:  None COMPLICATIONS: None immediate. PROCEDURE: Informed consent was obtained from the patient after a thorough discussion of the procedural risks, benefits and alternatives. All questions were addressed. Maximal Sterile Barrier Technique was utilized including caps, mask, sterile gowns, sterile  gloves, sterile drape, hand hygiene and skin antiseptic. A timeout was performed prior to the initiation of the procedure. The patient's right chest and catheter was prepped and draped in a normal sterile fashion. Using gentle blunt dissection the cuff of the catheter was exposed and the catheter was removed in it's entirety. Pressure was held till hemostasis was obtained. A sterile dressing was applied. The patient tolerated the procedure well with no immediate complications. IMPRESSION: Successful catheter removal as described above. Read by: Rowe Robert, PA-C Electronically Signed   By: Corrie Mckusick D.O.   On: 05/03/2020 14:58   IR US Guide Vasc Access Right  Result Date: 04/17/2020 INDICATION: In need of durable intravenous access for home antibiotics. Patient with chronic renal insufficiency. As such, request made for placement of a tunneled central venous catheter for discharge planning purposes. EXAM: ULTRASOUND AND FLUOROSCOPIC GUIDED PLACEMENT OF TUNNELED CENTRAL VENOUS CATHETER MEDICATIONS: None, the patient is admitted to the hospital and receiving intravenous antibiotics. The antibiotic was given in an appropriate time interval prior to skin puncture. ANESTHESIA/SEDATION: None FLUOROSCOPY TIME:  12 seconds (4 mGy) COMPLICATIONS: None immediate. PROCEDURE: Informed written consent was obtained from the patient after a discussion of the risks, benefits, and alternatives to treatment. Questions regarding the procedure were encouraged and answered. The right neck and chest were prepped with chlorhexidine in a sterile fashion, and a sterile drape was applied  covering the operative field. Maximum barrier sterile technique with sterile gowns and gloves were used for the procedure. A timeout was performed prior to the initiation of the procedure. After the overlying soft tissues were anesthetized, a small venotomy incision was created and a micropuncture kit was utilized to access the internal jugular  vein. Real-time ultrasound guidance was utilized for vascular access including the acquisition of a permanent ultrasound image documenting patency of the accessed vessel. The microwire was utilized to measure appropriate catheter length. The micropuncture sheath was exchanged for a peel-away sheath over a guidewire. A 5 French single lumen tunneled central venous catheter measuring 23 cm was tunneled in a retrograde fashion from the anterior chest wall to the venotomy incision. The catheter was then placed through the peel-away sheath with tip ultimately positioned at the superior caval-atrial junction. Final catheter positioning was confirmed and documented with a spot radiographic image. The catheter aspirates and flushes normally. The catheter was flushed with appropriate volume heparin dwells. The catheter exit site was secured with a 0-Prolene retention suture. The venotomy incision was closed with Dermabond and Steri-strips. Dressings were applied. The patient tolerated the procedure well without immediate post procedural complication. FINDINGS: After catheter placement, the tip lies within the superior cavoatrial junction. The catheter aspirates and flushes normally and is ready for immediate use. IMPRESSION: Successful placement of 23 cm single lumen tunneled central venous catheter via the right internal jugular vein with tip terminating at the superior caval atrial junction. The catheter is ready for immediate use. Electronically Signed   By: Sandi Mariscal M.D.   On: 04/17/2020 12:29   DG Chest Portable 1 View  Result Date: 05/09/2020 CLINICAL DATA:  Cough, fever. EXAM: PORTABLE CHEST 1 VIEW COMPARISON:  April 08, 2020. FINDINGS: The heart size and mediastinal contours are within normal limits. No pneumothorax or pleural effusion is noted. Right lung is clear. Minimal left basilar subsegmental atelectasis or scarring is noted. The visualized skeletal structures are unremarkable. IMPRESSION: Minimal  left basilar subsegmental atelectasis or scarring. Electronically Signed   By: Marijo Conception M.D.   On: 05/09/2020 14:50   IR NEPHROSTOMY PLACEMENT LEFT  Result Date: 04/13/2020 INDICATION: 84 year old male with a history of obstructing left UPJ stone, referred for percutaneous drainage EXAM: IR NEPHROSTOMY PLACEMENT LEFT COMPARISON:  None. MEDICATIONS: Ciprofloxacin 400 mg IV; The antibiotic was administered in an appropriate time frame prior to skin puncture. ANESTHESIA/SEDATION: Fentanyl 75 mcg IV; Versed 1.0 mg IV Moderate Sedation Time:  13 minutes The patient was continuously monitored during the procedure by the interventional radiology nurse under my direct supervision. CONTRAST:  49mL OMNIPAQUE IOHEXOL 300 MG/ML SOLN - administered into the collecting system(s) FLUOROSCOPY TIME:  Fluoroscopy Time: 1 minutes 48 seconds (6 mGy). COMPLICATIONS: None PROCEDURE: Informed written consent was obtained from the patient after a thorough discussion of the procedural risks, benefits and alternatives. All questions were addressed. Maximal Sterile Barrier Technique was utilized including caps, mask, sterile gowns, sterile gloves, sterile drape, hand hygiene and skin antiseptic. A timeout was performed prior to the initiation of the procedure. Patient positioned prone position on the fluoroscopy table. Ultrasound survey of the left flank was performed with images stored and sent to PACs. The patient was then prepped and draped in the usual sterile fashion. 1% lidocaine was used to anesthetize the skin and subcutaneous tissues for local anesthesia. A Chiba needle was then used to access a posterior inferior calyx with ultrasound guidance. With spontaneous urine returned through the needle, passage  of an 018 micro wire into the collecting system was performed under fluoroscopy. A small incision was made with an 11 blade scalpel, and the needle was removed from the wire. An Accustick system was then advanced over the  wire into the collecting system under fluoroscopy. The metal stiffener and inner dilator were removed, and then a sample of fluid was aspirated through the 4 French outer sheath. Bentson wire was passed into the collecting system and the sheath removed. Ten French dilation of the soft tissues was performed. Using modified Seldinger technique, a 10 French pigtail catheter drain was placed over the Bentson wire. Wire and inner stiffener removed, and the pigtail was formed in the collecting system. Small amount of contrast confirmed position of the catheter. Patient tolerated the procedure well and remained hemodynamically stable throughout. No complications were encountered and no significant blood loss encountered FINDINGS: Frankly purulent urine.  Sample was sent IMPRESSION: Status post image guided placement of left percutaneous nephrostomy. Sample was sent for culture. Signed, Dulcy Fanny. Dellia Nims, RPVI [facility] Radiology Electronically Signed   By: Corrie Mckusick D.O.   On: 04/13/2020 10:49   [place] EKG SITE RITE  Result Date: 05/11/2020 If Site Rite image not attached, placement could not be confirmed due to current cardiac rhythm.    Subjective: - no chest pain, shortness of breath, no abdominal pain, nausea or vomiting.   Discharge Exam: BP 108/70   Pulse 66   Temp 98.9 F (37.2 C) (Oral)   Resp 15   Ht $R'5\' 7"'oZ$  (1.702 m)   Wt 81.4 kg Comment: bedscale  SpO2 97%   BMI 28.11 kg/m   General: Pt is alert, awake, not in acute distress Extremities: no edema, no cyanosis    The results of significant diagnostics from this hospitalization (including imaging, microbiology, ancillary and laboratory) are listed below for reference.     Microbiology: Recent Results (from the past 240 hour(s))  Urine culture     Status: Abnormal   Collection Time: 05/09/20  2:24 PM   Specimen: Urine, Random  Result Value Ref Range Status   Specimen  Description   Final    URINE, RANDOM Performed at [facility] [address] [place], [place] [address]    Special Requests   Final    NONE Performed at [facility], [place] [address] [place], [place] [address]    Culture >=100,000 COLONIES/mL PSEUDOMONAS AERUGINOSA (A)  Final   Report Status 05/11/2020 FINAL  Final   Organism ID, Bacteria PSEUDOMONAS AERUGINOSA (A)  Final      Susceptibility   Pseudomonas aeruginosa - MIC*    CEFTAZIDIME 4 SENSITIVE Sensitive     CIPROFLOXACIN 1 SENSITIVE Sensitive     GENTAMICIN 2 SENSITIVE Sensitive     IMIPENEM 1 SENSITIVE Sensitive     PIP/TAZO 8 SENSITIVE Sensitive     CEFEPIME 2 SENSITIVE Sensitive     * >=100,000 COLONIES/mL PSEUDOMONAS AERUGINOSA  Culture, blood (routine x 2)     Status: None (Preliminary result)   Collection Time: 05/09/20  3:10 PM   Specimen: BLOOD  Result Value Ref Range Status   Specimen Description   Final    BLOOD RIGHT ANTECUBITAL Performed at [facility] [address], [place], [place] [address]    Special Requests   Final    BOTTLES DRAWN AEROBIC AND ANAEROBIC Blood Culture adequate volume Performed at [facility] [address]  [place], [place] [address]    Culture   Final    NO GROWTH 2 DAYS Performed at [facility], [place] [address] [place], [place] [address]    Report Status PENDING  Incomplete  Culture, blood (routine x 2)     Status: None (Preliminary result)   Collection Time: 05/09/20  3:25 PM   Specimen: BLOOD  Result Value Ref Range Status   Specimen Description   Final    BLOOD LEFT ANTECUBITAL Performed at [facility] [address] [place], [place] [address]    Special Requests   Final    BOTTLES DRAWN AEROBIC AND ANAEROBIC Blood Culture adequate volume Performed at [facility] [address] [place], [place] [address]    Culture   Final    NO GROWTH 2  DAYS Performed at [facility] [address], [place], [place] [address]    Report Status PENDING  Incomplete  Respiratory Panel by RT PCR (Flu A&B, Covid) - Nasopharyngeal Swab     Status: None   Collection Time: 05/09/20  5:44 PM   Specimen: Nasopharyngeal Swab  Result Value Ref Range Status   SARS Coronavirus 2 by RT PCR NEGATIVE NEGATIVE Final    Comment: (NOTE) SARS-CoV-2 target nucleic acids are NOT DETECTED.  The SARS-CoV-2 RNA is generally detectable in upper respiratoy specimens during the acute phase of infection. The lowest concentration of SARS-CoV-2 viral copies this assay can detect is 131 copies/mL. A negative result does not preclude SARS-Cov-2 infection and should not be used as the sole basis for treatment or other patient management decisions. A negative result may occur with  improper specimen collection/handling, submission of specimen other than nasopharyngeal swab, presence of viral mutation(s) within the areas targeted by this assay, and inadequate number of viral copies (<131 copies/mL). A negative result must be combined with clinical observations, patient history, and epidemiological information. The expected result is Negative.  Fact Sheet for Patients:  PinkCheek.be  Fact Sheet for Healthcare Providers:  GravelBags.it  This test is no t yet approved or cleared by the [place] FDA and  has been authorized for detection and/or diagnosis of SARS-CoV-2 by FDA under an Emergency Use Authorization (EUA). This EUA will remain  in effect (meaning this test can be used) for the duration of the COVID-19 declaration under Section 564(b)(1) of the Act, 21 U.S.C. section 360bbb-3(b)(1), unless the authorization is terminated or revoked sooner.     Influenza A by PCR NEGATIVE NEGATIVE Final   Influenza B by PCR NEGATIVE NEGATIVE Final    Comment: (NOTE) The Xpert Xpress SARS-CoV-2/FLU/RSV  assay is intended as an aid in  the diagnosis of influenza from Nasopharyngeal swab specimens and  should not be used as a sole basis for treatment. Nasal washings and  aspirates are unacceptable for Xpert Xpress SARS-CoV-2/FLU/RSV  testing.  Fact Sheet for Patients: PinkCheek.be  Fact Sheet for Healthcare Providers: GravelBags.it  This test is not yet approved or cleared by the [place] FDA and  has been authorized for detection and/or diagnosis of SARS-CoV-2 by  FDA under an Emergency Use Authorization (EUA). This EUA will remain  in effect (meaning this test can be used) for the duration of the  Covid-19 declaration under Section 564(b)(1) of the Act, 21  U.S.C. section 360bbb-3(b)(1), unless the authorization is  terminated or revoked. Performed at [facility], [place] [address], [place],  [address]      Labs: Basic  Metabolic Panel: Recent Labs  Lab 05/08/20 1346 05/09/20 1358 05/10/20 0319 05/11/20 0346  NA 140 137 133* 133*  K 4.9 3.8 3.5 4.0  CL 108 105 106 106  CO2 22 18* 17* 17*  GLUCOSE 105* 199* 170* 127*  BUN 41* 35* 33* 38*  CREATININE 1.67* 1.76* 1.61* 1.81*  CALCIUM 9.1 8.4* 8.3* 8.3*   Liver Function Tests: Recent Labs  Lab 05/09/20 1358 05/10/20 0319  AST 16 12*  ALT 16 12  ALKPHOS 74 62  BILITOT 0.6 0.5  PROT 7.9 6.9  ALBUMIN 3.6 3.0*   CBC: Recent Labs  Lab 05/08/20 1346 05/09/20 1358 05/10/20 0319 05/11/20 0346  WBC 8.6 12.4* 15.5* 10.9*  NEUTROABS  --  10.5*  --   --   HGB 12.4* 12.9* 11.6* 11.4*  HCT 39.8 40.2 35.8* 34.7*  MCV 96.4 95.9 93.5 93.3  PLT 201 201 166 185   CBG: Recent Labs  Lab 05/10/20 1207 05/10/20 1659 05/10/20 2130 05/11/20 0743 05/11/20 1143  GLUCAP 127* 147* 134* 139* 163*   Hgb A1c No results for input(s): HGBA1C in the last 72 hours. Lipid Profile No results for input(s): CHOL, HDL, LDLCALC, TRIG, CHOLHDL,  LDLDIRECT in the last 72 hours. Thyroid function studies No results for input(s): TSH, T4TOTAL, T3FREE, THYROIDAB in the last 72 hours.  Invalid input(s): FREET3 Urinalysis    Component Value Date/Time   COLORURINE STRAW (A) 05/09/2020 1424   APPEARANCEUR CLOUDY (A) 05/09/2020 1424   LABSPEC 1.008 05/09/2020 1424   PHURINE 6.0 05/09/2020 1424   GLUCOSEU NEGATIVE 05/09/2020 1424   HGBUR SMALL (A) 05/09/2020 1424   BILIRUBINUR NEGATIVE 05/09/2020 1424   BILIRUBINUR negative 04/14/2016 1456   KETONESUR NEGATIVE 05/09/2020 1424   PROTEINUR 100 (A) 05/09/2020 1424   UROBILINOGEN 0.2 04/14/2016 1456   NITRITE POSITIVE (A) 05/09/2020 1424   LEUKOCYTESUR LARGE (A) 05/09/2020 1424    FURTHER DISCHARGE INSTRUCTIONS:   Get Medicines reviewed and adjusted: Please take all your medications with you for your next visit with your Primary MD   Laboratory/radiological data: Please request your Primary MD to go over all hospital tests and procedure/radiological results at the follow up, please ask your Primary MD to get all Hospital records sent to his/her office.   In some cases, they will be blood work, cultures and biopsy results pending at the time of your discharge. Please request that your primary care M.D. goes through all the records of your hospital data and follows up on these results.   Also Note the following: If you experience worsening of your admission symptoms, develop shortness of breath, life threatening emergency, suicidal or homicidal thoughts you must seek medical attention immediately by calling 911 or calling your MD immediately  if symptoms less severe.   You must read complete instructions/literature along with all the possible adverse reactions/side effects for all the Medicines you take and that have been prescribed to you. Take any new Medicines after you have completely understood and accpet all the possible adverse reactions/side effects.    Do not drive when taking  Pain medications or sleeping medications (Benzodaizepines)   Do not take more than prescribed Pain, Sleep and Anxiety Medications. It is not advisable to combine anxiety,sleep and pain medications without talking with your primary care practitioner   Special Instructions: If you have smoked or chewed Tobacco  in the last 2 yrs please stop smoking, stop any regular Alcohol  and or any Recreational drug use.   Wear Seat belts  while driving.   Please note: You were cared for by a hospitalist during your hospital stay. Once you are discharged, your primary care physician will handle any further medical issues. Please note that NO REFILLS for any discharge medications will be authorized once you are discharged, as it is imperative that you return to your primary care physician (or establish a relationship with a primary care physician if you do not have one) for your post hospital discharge needs so that they can reassess your need for medications and monitor your lab values.  Time coordinating discharge: 40 minutes  SIGNED:  Marzetta Board, MD, PhD 05/11/2020, 4:21 PM

## 2020-05-12 DIAGNOSIS — J181 Lobar pneumonia, unspecified organism: Secondary | ICD-10-CM | POA: Diagnosis not present

## 2020-05-12 DIAGNOSIS — A4181 Sepsis due to Enterococcus: Secondary | ICD-10-CM | POA: Diagnosis not present

## 2020-05-12 DIAGNOSIS — Z436 Encounter for attention to other artificial openings of urinary tract: Secondary | ICD-10-CM | POA: Diagnosis not present

## 2020-05-12 DIAGNOSIS — C679 Malignant neoplasm of bladder, unspecified: Secondary | ICD-10-CM | POA: Diagnosis not present

## 2020-05-12 DIAGNOSIS — A4152 Sepsis due to Pseudomonas: Secondary | ICD-10-CM | POA: Diagnosis not present

## 2020-05-12 DIAGNOSIS — N136 Pyonephrosis: Secondary | ICD-10-CM | POA: Diagnosis not present

## 2020-05-14 LAB — CULTURE, BLOOD (ROUTINE X 2)
Culture: NO GROWTH
Culture: NO GROWTH
Special Requests: ADEQUATE
Special Requests: ADEQUATE

## 2020-05-15 ENCOUNTER — Other Ambulatory Visit (HOSPITAL_COMMUNITY)
Admission: RE | Admit: 2020-05-15 | Discharge: 2020-05-15 | Disposition: A | Discharge status: Home / Self Care | Payer: Medicare Other | Source: Ambulatory Visit | Attending: Urology | Admitting: Urology

## 2020-05-15 DIAGNOSIS — Z20822 Contact with and (suspected) exposure to covid-19: Secondary | ICD-10-CM | POA: Insufficient documentation

## 2020-05-15 DIAGNOSIS — Z01812 Encounter for preprocedural laboratory examination: Secondary | ICD-10-CM | POA: Insufficient documentation

## 2020-05-15 LAB — SARS CORONAVIRUS 2 (TAT 6-24 HRS): SARS Coronavirus 2: NEGATIVE

## 2020-05-17 MED ORDER — SODIUM CHLORIDE 0.9 % IV SOLN
1.0000 g | INTRAVENOUS | Status: DC
  Filled 2020-05-17: qty 1000

## 2020-05-17 MED ORDER — GENTAMICIN SULFATE 40 MG/ML IJ SOLN
400.0000 mg | INTRAVENOUS | Status: AC
  Administered 2020-05-18: 400 mg via INTRAVENOUS
  Filled 2020-05-17: qty 10

## 2020-05-18 ENCOUNTER — Inpatient Hospital Stay (HOSPITAL_COMMUNITY): Payer: Medicare Other | Admitting: Emergency Medicine

## 2020-05-18 ENCOUNTER — Encounter (HOSPITAL_COMMUNITY)
Admission: RE | Disposition: A | Discharge status: Home / Self Care | Payer: Self-pay | Source: Home / Self Care | Attending: Urology

## 2020-05-18 ENCOUNTER — Inpatient Hospital Stay (HOSPITAL_COMMUNITY): Payer: Medicare Other | Admitting: Certified Registered Nurse Anesthetist

## 2020-05-18 ENCOUNTER — Observation Stay (HOSPITAL_COMMUNITY)
Admission: RE | Admit: 2020-05-18 | Discharge: 2020-05-19 | Disposition: A | Discharge status: Home / Self Care | Payer: Medicare Other | Attending: Urology | Admitting: Urology

## 2020-05-18 ENCOUNTER — Other Ambulatory Visit: Payer: Self-pay

## 2020-05-18 ENCOUNTER — Encounter (HOSPITAL_COMMUNITY): Payer: Self-pay | Admitting: Urology

## 2020-05-18 ENCOUNTER — Inpatient Hospital Stay (HOSPITAL_COMMUNITY): Payer: Medicare Other

## 2020-05-18 DIAGNOSIS — K319 Disease of stomach and duodenum, unspecified: Secondary | ICD-10-CM | POA: Diagnosis not present

## 2020-05-18 DIAGNOSIS — N189 Chronic kidney disease, unspecified: Secondary | ICD-10-CM | POA: Diagnosis not present

## 2020-05-18 DIAGNOSIS — Z79899 Other long term (current) drug therapy: Secondary | ICD-10-CM | POA: Insufficient documentation

## 2020-05-18 DIAGNOSIS — I129 Hypertensive chronic kidney disease with stage 1 through stage 4 chronic kidney disease, or unspecified chronic kidney disease: Secondary | ICD-10-CM | POA: Diagnosis not present

## 2020-05-18 DIAGNOSIS — C677 Malignant neoplasm of urachus: Secondary | ICD-10-CM | POA: Insufficient documentation

## 2020-05-18 DIAGNOSIS — G510 Bell's palsy: Secondary | ICD-10-CM | POA: Diagnosis not present

## 2020-05-18 DIAGNOSIS — A4152 Sepsis due to Pseudomonas: Secondary | ICD-10-CM | POA: Diagnosis not present

## 2020-05-18 DIAGNOSIS — Z452 Encounter for adjustment and management of vascular access device: Secondary | ICD-10-CM | POA: Diagnosis not present

## 2020-05-18 DIAGNOSIS — Z87891 Personal history of nicotine dependence: Secondary | ICD-10-CM | POA: Diagnosis not present

## 2020-05-18 DIAGNOSIS — D509 Iron deficiency anemia, unspecified: Secondary | ICD-10-CM | POA: Diagnosis not present

## 2020-05-18 DIAGNOSIS — J181 Lobar pneumonia, unspecified organism: Secondary | ICD-10-CM | POA: Diagnosis not present

## 2020-05-18 DIAGNOSIS — I2510 Atherosclerotic heart disease of native coronary artery without angina pectoris: Secondary | ICD-10-CM | POA: Insufficient documentation

## 2020-05-18 DIAGNOSIS — N1831 Chronic kidney disease, stage 3a: Secondary | ICD-10-CM | POA: Diagnosis not present

## 2020-05-18 DIAGNOSIS — I6782 Cerebral ischemia: Secondary | ICD-10-CM | POA: Diagnosis not present

## 2020-05-18 DIAGNOSIS — Z436 Encounter for attention to other artificial openings of urinary tract: Secondary | ICD-10-CM | POA: Diagnosis not present

## 2020-05-18 DIAGNOSIS — Q6102 Congenital multiple renal cysts: Secondary | ICD-10-CM | POA: Diagnosis not present

## 2020-05-18 DIAGNOSIS — E1122 Type 2 diabetes mellitus with diabetic chronic kidney disease: Secondary | ICD-10-CM | POA: Diagnosis not present

## 2020-05-18 DIAGNOSIS — Z794 Long term (current) use of insulin: Secondary | ICD-10-CM | POA: Diagnosis not present

## 2020-05-18 DIAGNOSIS — Z8551 Personal history of malignant neoplasm of bladder: Secondary | ICD-10-CM | POA: Diagnosis not present

## 2020-05-18 DIAGNOSIS — I350 Nonrheumatic aortic (valve) stenosis: Secondary | ICD-10-CM | POA: Diagnosis not present

## 2020-05-18 DIAGNOSIS — K861 Other chronic pancreatitis: Secondary | ICD-10-CM | POA: Diagnosis not present

## 2020-05-18 DIAGNOSIS — K269 Duodenal ulcer, unspecified as acute or chronic, without hemorrhage or perforation: Secondary | ICD-10-CM | POA: Diagnosis not present

## 2020-05-18 DIAGNOSIS — D630 Anemia in neoplastic disease: Secondary | ICD-10-CM | POA: Diagnosis not present

## 2020-05-18 DIAGNOSIS — N200 Calculus of kidney: Principal | ICD-10-CM

## 2020-05-18 DIAGNOSIS — I10 Essential (primary) hypertension: Secondary | ICD-10-CM | POA: Diagnosis not present

## 2020-05-18 DIAGNOSIS — E785 Hyperlipidemia, unspecified: Secondary | ICD-10-CM | POA: Diagnosis not present

## 2020-05-18 DIAGNOSIS — C679 Malignant neoplasm of bladder, unspecified: Secondary | ICD-10-CM | POA: Diagnosis not present

## 2020-05-18 DIAGNOSIS — D631 Anemia in chronic kidney disease: Secondary | ICD-10-CM | POA: Diagnosis not present

## 2020-05-18 DIAGNOSIS — N136 Pyonephrosis: Secondary | ICD-10-CM | POA: Diagnosis not present

## 2020-05-18 DIAGNOSIS — K862 Cyst of pancreas: Secondary | ICD-10-CM | POA: Diagnosis not present

## 2020-05-18 DIAGNOSIS — E1110 Type 2 diabetes mellitus with ketoacidosis without coma: Secondary | ICD-10-CM | POA: Diagnosis not present

## 2020-05-18 DIAGNOSIS — E1129 Type 2 diabetes mellitus with other diabetic kidney complication: Secondary | ICD-10-CM | POA: Insufficient documentation

## 2020-05-18 DIAGNOSIS — I700 Atherosclerosis of aorta: Secondary | ICD-10-CM | POA: Diagnosis not present

## 2020-05-18 DIAGNOSIS — A4181 Sepsis due to Enterococcus: Secondary | ICD-10-CM | POA: Diagnosis not present

## 2020-05-18 DIAGNOSIS — J9811 Atelectasis: Secondary | ICD-10-CM | POA: Diagnosis not present

## 2020-05-18 HISTORY — PX: NEPHROLITHOTOMY: SHX5134

## 2020-05-18 LAB — GLUCOSE, CAPILLARY
Glucose-Capillary: 111 mg/dL — ABNORMAL HIGH (ref 70–99)
Glucose-Capillary: 114 mg/dL — ABNORMAL HIGH (ref 70–99)
Glucose-Capillary: 165 mg/dL — ABNORMAL HIGH (ref 70–99)
Glucose-Capillary: 238 mg/dL — ABNORMAL HIGH (ref 70–99)
Glucose-Capillary: 150 mg/dL — ABNORMAL HIGH (ref 70–99)

## 2020-05-18 LAB — HEMOGLOBIN AND HEMATOCRIT, BLOOD
HCT: 48.8 % (ref 39.0–52.0)
Hemoglobin: 15.8 g/dL (ref 13.0–17.0)

## 2020-05-18 SURGERY — NEPHROLITHOTOMY PERCUTANEOUS
Anesthesia: General | Laterality: Left

## 2020-05-18 MED ORDER — ROCURONIUM BROMIDE 10 MG/ML (PF) SYRINGE
PREFILLED_SYRINGE | INTRAVENOUS | Status: DC | PRN
  Administered 2020-05-18: 80 mg via INTRAVENOUS

## 2020-05-18 MED ORDER — CIPROFLOXACIN IN D5W 400 MG/200ML IV SOLN
400.0000 mg | Freq: Once | INTRAVENOUS | Status: DC
  Filled 2020-05-18: qty 200

## 2020-05-18 MED ORDER — ALBUTEROL SULFATE HFA 108 (90 BASE) MCG/ACT IN AERS: 1.0000 | INHALATION_SPRAY | Freq: Four times a day (QID) | RESPIRATORY_TRACT | Status: DC | PRN

## 2020-05-18 MED ORDER — ORAL CARE MOUTH RINSE: 15.0000 mL | Freq: Once | OROMUCOSAL | Status: AC

## 2020-05-18 MED ORDER — HYDROMORPHONE HCL 1 MG/ML IJ SOLN: 0.5000 mg | INTRAMUSCULAR | Status: DC | PRN

## 2020-05-18 MED ORDER — DEXAMETHASONE SODIUM PHOSPHATE 10 MG/ML IJ SOLN
INTRAMUSCULAR | Status: AC
  Filled 2020-05-18: qty 1

## 2020-05-18 MED ORDER — PHENYLEPHRINE 40 MCG/ML (10ML) SYRINGE FOR IV PUSH (FOR BLOOD PRESSURE SUPPORT)
PREFILLED_SYRINGE | INTRAVENOUS | Status: DC | PRN
  Administered 2020-05-18: 120 ug via INTRAVENOUS
  Administered 2020-05-18: 80 ug via INTRAVENOUS

## 2020-05-18 MED ORDER — SODIUM CHLORIDE 0.9 % IV SOLN
2.0000 g | INTRAVENOUS | Status: DC
  Administered 2020-05-18: 2 g via INTRAVENOUS
  Filled 2020-05-18: qty 2

## 2020-05-18 MED ORDER — TRAMADOL HCL 50 MG PO TABS
50.0000 mg | ORAL_TABLET | Freq: Four times a day (QID) | ORAL | 0 refills | Status: DC | PRN
Start: 2020-05-18 — End: 2020-09-03

## 2020-05-18 MED ORDER — LIDOCAINE 2% (20 MG/ML) 5 ML SYRINGE
INTRAMUSCULAR | Status: AC
  Filled 2020-05-18: qty 5

## 2020-05-18 MED ORDER — PRAVASTATIN SODIUM 20 MG PO TABS
20.0000 mg | ORAL_TABLET | Freq: Every day | ORAL | Status: DC
  Administered 2020-05-18 – 2020-05-19 (×2): 20 mg via ORAL
  Filled 2020-05-18 (×2): qty 1

## 2020-05-18 MED ORDER — ONDANSETRON HCL 4 MG/2ML IJ SOLN
INTRAMUSCULAR | Status: DC | PRN
  Administered 2020-05-18: 4 mg via INTRAVENOUS

## 2020-05-18 MED ORDER — SUGAMMADEX SODIUM 200 MG/2ML IV SOLN
INTRAVENOUS | Status: DC | PRN
  Administered 2020-05-18: 200 mg via INTRAVENOUS

## 2020-05-18 MED ORDER — PHENYLEPHRINE HCL-NACL 10-0.9 MG/250ML-% IV SOLN
INTRAVENOUS | Status: DC | PRN
  Administered 2020-05-18: 50 ug/min via INTRAVENOUS

## 2020-05-18 MED ORDER — SODIUM CHLORIDE 0.9 % IV SOLN: INTRAVENOUS | Status: DC

## 2020-05-18 MED ORDER — INSULIN ASPART 100 UNIT/ML ~~LOC~~ SOLN: 0.0000 [IU] | Freq: Every day | SUBCUTANEOUS | Status: DC

## 2020-05-18 MED ORDER — CEFEPIME IV (FOR PTA / DISCHARGE USE ONLY): 2.0000 g | INTRAVENOUS | Status: DC

## 2020-05-18 MED ORDER — IOHEXOL 300 MG/ML  SOLN
INTRAMUSCULAR | Status: DC | PRN
  Administered 2020-05-18: 6 mL

## 2020-05-18 MED ORDER — INSULIN ASPART 100 UNIT/ML ~~LOC~~ SOLN: 0.0000 [IU] | Freq: Three times a day (TID) | SUBCUTANEOUS | Status: DC

## 2020-05-18 MED ORDER — FENTANYL CITRATE (PF) 100 MCG/2ML IJ SOLN
INTRAMUSCULAR | Status: AC
  Filled 2020-05-18: qty 2

## 2020-05-18 MED ORDER — ONDANSETRON HCL 4 MG/2ML IJ SOLN
INTRAMUSCULAR | Status: AC
  Filled 2020-05-18: qty 2

## 2020-05-18 MED ORDER — SENNOSIDES-DOCUSATE SODIUM 8.6-50 MG PO TABS: 1.0000 | ORAL_TABLET | Freq: Two times a day (BID) | ORAL | 0 refills | Status: DC

## 2020-05-18 MED ORDER — PROPOFOL 10 MG/ML IV BOLUS
INTRAVENOUS | Status: DC | PRN
  Administered 2020-05-18: 120 mg via INTRAVENOUS

## 2020-05-18 MED ORDER — OXYCODONE HCL 5 MG PO TABS: 5.0000 mg | ORAL_TABLET | ORAL | Status: DC | PRN

## 2020-05-18 MED ORDER — FENTANYL CITRATE (PF) 100 MCG/2ML IJ SOLN
INTRAMUSCULAR | Status: AC
  Administered 2020-05-18: 25 ug via INTRAVENOUS
  Filled 2020-05-18: qty 2

## 2020-05-18 MED ORDER — FENTANYL CITRATE (PF) 100 MCG/2ML IJ SOLN
INTRAMUSCULAR | Status: DC | PRN
  Administered 2020-05-18 (×2): 50 ug via INTRAVENOUS

## 2020-05-18 MED ORDER — PHENYLEPHRINE 40 MCG/ML (10ML) SYRINGE FOR IV PUSH (FOR BLOOD PRESSURE SUPPORT)
PREFILLED_SYRINGE | INTRAVENOUS | Status: AC
  Filled 2020-05-18: qty 10

## 2020-05-18 MED ORDER — ROCURONIUM BROMIDE 10 MG/ML (PF) SYRINGE
PREFILLED_SYRINGE | INTRAVENOUS | Status: AC
  Filled 2020-05-18: qty 10

## 2020-05-18 MED ORDER — LACTATED RINGERS IV SOLN: INTRAVENOUS | Status: DC

## 2020-05-18 MED ORDER — PROPOFOL 10 MG/ML IV BOLUS
INTRAVENOUS | Status: AC
  Filled 2020-05-18: qty 20

## 2020-05-18 MED ORDER — ACETAMINOPHEN 500 MG PO TABS
ORAL_TABLET | ORAL | Status: AC
  Administered 2020-05-18: 1000 mg via ORAL
  Filled 2020-05-18: qty 2

## 2020-05-18 MED ORDER — SODIUM CHLORIDE 0.9 % IR SOLN
Status: DC | PRN
  Administered 2020-05-18: 9000 mL

## 2020-05-18 MED ORDER — FENTANYL CITRATE (PF) 100 MCG/2ML IJ SOLN
25.0000 ug | INTRAMUSCULAR | Status: DC | PRN
  Administered 2020-05-18: 25 ug via INTRAVENOUS
  Administered 2020-05-18: 50 ug via INTRAVENOUS

## 2020-05-18 MED ORDER — DEXAMETHASONE SODIUM PHOSPHATE 4 MG/ML IJ SOLN
INTRAMUSCULAR | Status: DC | PRN
  Administered 2020-05-18: 5 mg via INTRAVENOUS

## 2020-05-18 MED ORDER — CIPROFLOXACIN HCL 500 MG PO TABS: 500.0000 mg | ORAL_TABLET | Freq: Two times a day (BID) | ORAL | 0 refills | Status: AC

## 2020-05-18 MED ORDER — PHENYLEPHRINE HCL-NACL 10-0.9 MG/250ML-% IV SOLN
INTRAVENOUS | Status: AC
  Filled 2020-05-18: qty 250

## 2020-05-18 MED ORDER — ACETAMINOPHEN 500 MG PO TABS
1000.0000 mg | ORAL_TABLET | Freq: Three times a day (TID) | ORAL | Status: AC
  Administered 2020-05-18: 1000 mg via ORAL
  Filled 2020-05-18: qty 2

## 2020-05-18 MED ORDER — SENNOSIDES-DOCUSATE SODIUM 8.6-50 MG PO TABS: 1.0000 | ORAL_TABLET | Freq: Two times a day (BID) | ORAL | Status: DC

## 2020-05-18 MED ORDER — CHLORHEXIDINE GLUCONATE 0.12 % MT SOLN
15.0000 mL | Freq: Once | OROMUCOSAL | Status: AC
  Administered 2020-05-18: 15 mL via OROMUCOSAL

## 2020-05-18 MED ORDER — LIDOCAINE 2% (20 MG/ML) 5 ML SYRINGE
INTRAMUSCULAR | Status: DC | PRN
  Administered 2020-05-18: 60 mg via INTRAVENOUS

## 2020-05-18 SURGICAL SUPPLY — 69 items
BAG URINE DRAIN 2000ML AR STRL (UROLOGICAL SUPPLIES) IMPLANT
BAG URO CATCHER STRL LF (MISCELLANEOUS) IMPLANT
BASKET LASER NITINOL 1.9FR (BASKET) IMPLANT
BASKET ZERO TIP NITINOL 2.4FR (BASKET) IMPLANT
BENZOIN TINCTURE PRP APPL 2/3 (GAUZE/BANDAGES/DRESSINGS) ×3 IMPLANT
BLADE SURG 15 STRL LF DISP TIS (BLADE) ×1 IMPLANT
BLADE SURG 15 STRL SS (BLADE) ×2
CATH FOLEY 2W COUNCIL 20FR 5CC (CATHETERS) IMPLANT
CATH FOLEY 2WAY SLVR  5CC 16FR (CATHETERS)
CATH FOLEY 2WAY SLVR 5CC 16FR (CATHETERS) IMPLANT
CATH INTERMIT  6FR 70CM (CATHETERS) IMPLANT
CATH MULTI PURPOSE 16FR DRAIN (CATHETERS) IMPLANT
CATH ROBINSON RED A/P 20FR (CATHETERS) IMPLANT
CATH ULTRATHANE 14FR (CATHETERS) IMPLANT
CATH UROLOGY TORQUE 40 (MISCELLANEOUS) ×3 IMPLANT
CATH X-FORCE N30 NEPHROSTOMY (TUBING) IMPLANT
CHLORAPREP W/TINT 26 (MISCELLANEOUS) ×3 IMPLANT
COVER WAND RF STERILE (DRAPES) IMPLANT
DRAPE C-ARM 42X120 X-RAY (DRAPES) ×3 IMPLANT
DRAPE HALF SHEET 70X43 (DRAPES) ×3 IMPLANT
DRAPE LINGEMAN PERC (DRAPES) ×3 IMPLANT
DRAPE SHEET LG 3/4 BI-LAMINATE (DRAPES) IMPLANT
DRAPE SURG IRRIG POUCH 19X23 (DRAPES) ×3 IMPLANT
DRSG PAD ABDOMINAL 8X10 ST (GAUZE/BANDAGES/DRESSINGS) ×6 IMPLANT
DRSG TEGADERM 4X4.75 (GAUZE/BANDAGES/DRESSINGS) IMPLANT
DRSG TEGADERM 6X8 (GAUZE/BANDAGES/DRESSINGS) ×3 IMPLANT
DRSG TEGADERM 8X12 (GAUZE/BANDAGES/DRESSINGS) ×3 IMPLANT
GAUZE SPONGE 4X4 12PLY STRL (GAUZE/BANDAGES/DRESSINGS) ×3 IMPLANT
GLOVE BIOGEL M STRL SZ7.5 (GLOVE) ×3 IMPLANT
GOWN STRL REUS W/TWL LRG LVL3 (GOWN DISPOSABLE) ×3 IMPLANT
GUIDEWIRE AMPLAZ .035X145 (WIRE) ×6 IMPLANT
GUIDEWIRE ANG ZIPWIRE 038X150 (WIRE) ×3 IMPLANT
GUIDEWIRE STR DUAL SENSOR (WIRE) ×3 IMPLANT
IV SET EXTENSION CATH 6 NF (IV SETS) IMPLANT
KIT BASIN OR (CUSTOM PROCEDURE TRAY) ×3 IMPLANT
KIT PROBE 340X3.4XDISP GRN (MISCELLANEOUS) IMPLANT
KIT PROBE TRILOGY 3.4X340 (MISCELLANEOUS)
KIT PROBE TRILOGY 3.9X350 (MISCELLANEOUS) IMPLANT
KIT TURNOVER KIT A (KITS) IMPLANT
LASER FIB FLEXIVA PULSE ID 365 (Laser) IMPLANT
LASER FIB FLEXIVA PULSE ID 550 (Laser) IMPLANT
LASER FIB FLEXIVA PULSE ID 910 (Laser) IMPLANT
MANIFOLD NEPTUNE II (INSTRUMENTS) ×3 IMPLANT
NEEDLE TROCAR 18X15 ECHO (NEEDLE) IMPLANT
NEEDLE TROCAR 18X20 (NEEDLE) IMPLANT
NS IRRIG 1000ML POUR BTL (IV SOLUTION) ×3 IMPLANT
PACK CYSTO (CUSTOM PROCEDURE TRAY) IMPLANT
SHEATH PEELAWAY SET 9 (SHEATH) ×3 IMPLANT
SPONGE LAP 18X18 RF (DISPOSABLE) ×3 IMPLANT
SPONGE LAP 4X18 RFD (DISPOSABLE) IMPLANT
SURGIFLO W/THROMBIN 8M KIT (HEMOSTASIS) ×3 IMPLANT
SUT ETHILON 3 0 PS 1 (SUTURE) ×3 IMPLANT
SUT SILK 2 0 30  PSL (SUTURE)
SUT SILK 2 0 30 PSL (SUTURE) IMPLANT
SUT VIC AB 2-0 CT1 27 (SUTURE) ×2
SUT VIC AB 2-0 CT1 TAPERPNT 27 (SUTURE) ×1 IMPLANT
SYR 10ML LL (SYRINGE) ×3 IMPLANT
SYR 20ML LL LF (SYRINGE) ×6 IMPLANT
SYR 50ML LL SCALE MARK (SYRINGE) ×3 IMPLANT
TOWEL OR 17X26 10 PK STRL BLUE (TOWEL DISPOSABLE) ×3 IMPLANT
TRACTIP FLEXIVA PULS ID 200XHI (Laser) IMPLANT
TRACTIP FLEXIVA PULSE ID 200 (Laser)
TRAY FOLEY MTR SLVR 16FR STAT (SET/KITS/TRAYS/PACK) IMPLANT
TUBE CONNECTING VINYL 14FR 30C (TUBING) IMPLANT
TUBING CONNECTING 10 (TUBING) ×4 IMPLANT
TUBING CONNECTING 10' (TUBING) ×2
TUBING STONE CATCHER TRILOGY (MISCELLANEOUS) IMPLANT
TUBING UROLOGY SET (TUBING) ×3 IMPLANT
WATER STERILE IRR 1000ML POUR (IV SOLUTION) IMPLANT

## 2020-05-18 NOTE — Anesthesia Procedure Notes (Signed)
Procedure Name: Intubation Date/Time: 05/18/2020 8:33 AM Performed by: Claudia Desanctis, CRNA Pre-anesthesia Checklist: Patient identified, Emergency Drugs available, Suction available and Patient being monitored Patient Re-evaluated:Patient Re-evaluated prior to induction Oxygen Delivery Method: Circle system utilized Preoxygenation: Pre-oxygenation with 100% oxygen Induction Type: IV induction Ventilation: Mask ventilation without difficulty Laryngoscope Size: 2 and Miller Grade View: Grade I Tube type: Oral Tube size: 7.5 mm Number of attempts: 1 Airway Equipment and Method: Stylet Placement Confirmation: ETT inserted through vocal cords under direct vision,  positive ETCO2 and breath sounds checked- equal and bilateral Secured at: 22 cm Tube secured with: Tape Dental Injury: Teeth and Oropharynx as per pre-operative assessment

## 2020-05-18 NOTE — Transfer of Care (Signed)
Immediate Anesthesia Transfer of Care Note  Patient: Shane [name]  Procedure(s) Performed: NEPHROLITHOTOMY PERCUTANEOUS (Left ) ANTEGRADE URETEROSCOPY WITH STENT PLACEMENT (Left )  Patient Location: PACU  Anesthesia Type:General  Level of Consciousness: awake and patient cooperative  Airway & Oxygen Therapy: Patient Spontanous Breathing and Patient connected to face mask  Post-op Assessment: Report given to RN and Post -op Vital signs reviewed and stable  Post vital signs: Reviewed and stable  Last Vitals:  Vitals Value Taken Time  BP 142/80 05/18/20 0956  Temp    Pulse 57 05/18/20 0958  Resp 12 05/18/20 0958  SpO2 99 % 05/18/20 0958  Vitals shown include unvalidated device data.  Last Pain:  Vitals:   05/18/20 0753  TempSrc:   PainSc: 0-No pain         Complications: No complications documented.

## 2020-05-18 NOTE — H&P (Signed)
Shane [name] is an 84 y.o. male.    Chief Complaint: Pre-Op LEFT Percutaneous Nephrostolithotomy  HPI:   1 - Muscle-Invasive Bladder Cancer - s/p robotic cystectomy with ICG sentinal + template lymphadnectomynd umbilical hernia repair 10/84 for pT2N0Mx high grade cancer with negative margins after neoadjuvant chemo by [facility]. Urethra still in situ   Post-op Surveillance:  03/2017 - CMP, CT, CXR, Urethroscopy - no recurrence, Cr 0.6  09/2017 - CMP, CT, CXR, Urethroscopy - no recurrence, Cr 1.0, small left renal stone ==> did not follow up  03/2020 - MRI,CT - no recurrence, Cr 1.97   2 -- Urostomy - ileal conduit urinary diversion with bricker-type refluxing anastamoses at time of cystectomy 2018.   3 - Urolithiasis - 77mm LLP stone w/o hydro on bladder cacner surveillance CT 09/2017. Progression to 1cm by MRI and CT 2021 and left neph tube placed.   4 - Stage 4 Renal Insuficency - Cr 2's 2021. Mild left hydro on CT, GFR no change with neph tube. he has long h/o vascular disease and diabetes with renal vascular calcifications visible on imaging.   5 - Bacteruria - psudomonas bacteruria from neph tube 04/2020 sens cefepime, variable to cipro. Similar organism in [place] prior.   PMH sig for DM2 (A1c< 7), Cystic pancreatic head lesion (stable on imaging x several), CVA. 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 His PCP is with [facility]. He is retired from [employer] having worked in [place] and abroad.   Today " Shane [name] " is seen to proceed with LEFT PCNL with goal of sotne free. He has been on IV Cefepime at [facility] for his bacteruria. No interval fevers.   Past Medical History:  Diagnosis Date  . Abnormal EKG 2014   This led to stress test which was abnl, which led to cardiology referral: cath showed small vessel dz of 2nd diag branch with 70-80% stenosis--preserved LV function  . Aortic atherosclerosis ([place])    w/out aneurism  . Bilateral  renal cysts    Non-complex (CT 03/2016): no enhancement, coarse calcifications, mass effect does give false appearance of hydro on non-contract series (followed by urol).  . Bladder cancer ([place]) 03/2016   Invasive high grade urothelial carcinoma.  No mets at dx.  Cystoscopy + bx and partial resection of tumor in hosp when admitted for gross hematuria and AUR.  Neoadj chemo 06/2016, then got cystoprostatectomy. (Urostomy--ileal conduit urinary diversion with refluxing anastamoses--has RLQ urostomy)-gets routine surveillance by urol (Dr. Tresa Moore), most recent 04/2017--no sign of recurrence--obs status  . CAD (coronary artery disease)    small vessel dz x 1 vessel on cath 2014: med mgmt  . Cerebellar cerebrovascular accident (CVA) without late effect 10/2017   Left, punctate: DAPT therapy x 3 wks per Dr. Leonie Man, neuro, then ASA 81mg  alone.  Stable as of 11/2017 f/u with neuro, Dr. Erlinda Hong.  . Chronic calcific pancreatitis ([place]) 03/2016   +stable on f/u imaging 11/2018, + 2 small pseudocysts  . Chronic renal insufficiency, stage 2 (mild) 2019   Borderline II/III  . Diabetes mellitus without complication ([place])    Insulin dependent; DKA admission 10/2017.  . GI bleed 12/2017   Presented with melena and Hb around 5.  Transfused 5 U pRBCs, started to get hematochezia in hosp: no clear explanation for GI bleeding was found on EGD or colonoscopy.  CT abd w/out acute abnormality.  Upper GI w/SBFT showed subtle duodenal  erosion.  [name] Heart murmur, systolic    Noted in prior PCP's records.  I recommended echocardiogram 07/2016 but pt declined.  [name] History of Bell's palsy   . History of chemotherapy   . History of kidney stones   . Hyperlipidemia   . Hypertension   . IDA (iron deficiency anemia) 12/2017   GI bleed  . Moderate aortic stenosis    Echo 03/2019. Pt asymptomatic.  Rpt 1 yr.   . Nephrolithiasis 03/2020   1 cm L renal pelvis stone-->hydronephrosis and pyelonephritis->sepsis (enterococcus and pseudomonas).  .  Pancreas cyst 03/2016   CT abd/pelv 12/2017 for GI bleed w/u showed 1.6 cm pancreatic head cyst-->stable and likely benign.  Abd MRI 11/2018->stable pancreatic pseudocysts + chronic pancreatitis changes. Rpt MRI abd w &w/out contrast 2 yrs (approx 11/2020).  . Pneumonia    with Covid  . Sepsis ([place]) 03/2020   pseudomonas and enterococcus.  He had large L renal stone causing hydronephrosis->PCN was done.  [name] Umbilical hernia    Repaired when he got his bladder surgery.    Past Surgical History:  Procedure Laterality Date  . BIOPSY  12/18/2017   Procedure: BIOPSY;  Surgeon: Doran Stabler, MD;  Location: [facility];  Service: Gastroenterology;;  . CARDIAC CATHETERIZATION  02/09/2013   small vessel dz of 2nd diag branch with 70-80% stenosis--preserved LV function. Medical therapy rec'd. Agustin Cree, PA)  . CARDIOVASCULAR STRESS TEST  01/26/2013   No ischemia.  Wall motion abnormalities at inferior base suggesting small area of infarction.  EF 64%.  . CAROTID ARTERY DOPPLERS  10/15/2017   1-39% stenosis R ICA, no stenosis L ICA.  [name] CATARACT EXTRACTION    . COLONOSCOPY N/A 12/19/2017   Clotted blood in cecum w/out any underlying abnormality found -->entired colon and terminal ilium normal.  Procedure: COLONOSCOPY;  Surgeon: Doran Stabler, MD;  Location: [facility];  Service: Gastroenterology;  Laterality: N/A;  . cystoprostatectomy w/LN surgery  10/17/2016   Ileal conduit: this is an incontinent urine diversion that directs urine from the ureters through a segment of isolated bowel to the surface of the abdominal wall via a cutaneous stoma, and urine drains continuously into an external ostomy appliance.  . CYSTOSCOPY WITH INJECTION N/A 10/17/2016   Procedure: CYSTOSCOPY WITH INJECTION OF INDOCYANINE GREEN DYE;  Surgeon: Alexis Frock, MD;  Location: [facility];  Service: Urology;  Laterality: N/A;  . ESOPHAGOGASTRODUODENOSCOPY N/A 12/18/2017   Mild chronic gastritis, no metaplasia or dysplasia.   H pylori pending as of 12/21/17.  nonbleeding duodenal ulcers.  Procedure: ESOPHAGOGASTRODUODENOSCOPY (EGD);  Surgeon: Doran Stabler, MD;  Location: [facility];  Service: Gastroenterology;  Laterality: N/A;  . EYE SURGERY     cataract surgery bilat   . IR FLUORO GUIDE CV LINE RIGHT  04/17/2020  . IR GENERIC HISTORICAL  05/12/2016   IR US GUIDE VASC ACCESS RIGHT 05/12/2016 Greggory Keen, MD [facility]  . IR GENERIC HISTORICAL  05/12/2016   IR FLUORO GUIDE PORT INSERTION RIGHT 05/12/2016 Greggory Keen, MD [facility]  . IR NEPHROSTOMY PLACEMENT LEFT  04/13/2020  . IR REMOVAL TUN ACCESS W/ PORT W/O FL MOD SED  07/03/2017  . IR REMOVAL TUN CV CATH W/O FL  05/03/2020  . IR US GUIDE VASC ACCESS RIGHT  04/17/2020  . OSTOMY     urostomy  . TONSILLECTOMY AND ADENOIDECTOMY  Remote  . TRANSTHORACIC ECHOCARDIOGRAM  10/16/2017; 03/2019   10/2017 EF 65-70%, normal wall motion, grd I DD.  03/2019 same but also new moderate aortic stenosis. Rpt echo 1 yr.  . TRANSURETHRAL RESECTION OF PROSTATE N/A 04/02/2016   Procedure: CYSTOSCOPY, TRANSURETHRAL RESECTION OF BLADDER TUMOR;  Surgeon: [name], MD;  Location: [facility];  Service: Urology;  Laterality: N/A;  . UMBILICAL HERNIA REPAIR  10/2016  . VASECTOMY      Family History  Problem Relation Age of Onset  . Diabetes Mellitus I Mother   . Diabetes Mellitus I Sister   . Stroke Paternal Grandfather    Social History:  reports that he has quit smoking. He quit after 3.00 years of use. He has never used smokeless tobacco. He reports that he does not drink alcohol and does not use drugs.  Allergies:  Allergies  Allergen Reactions  . Other Swelling and Other (See Comments)    Pt states had swelling of the eye when having eye surgery and the anesthetic placed in eye- recovered the same day    No medications prior to admission.    No results found for this or any previous visit (from the past 48 hour(s)). No results found.  Review of  Systems  Constitutional: Negative for chills and fever.  All other systems reviewed and are negative.   There were no vitals taken for this visit. Physical Exam Vitals reviewed.  HENT:     Nose: Nose normal.     Mouth/Throat:     Mouth: Mucous membranes are moist.  Eyes:     Pupils: Pupils are equal, round, and reactive to light.  Cardiovascular:     Rate and Rhythm: Normal rate.  Pulmonary:     Effort: Pulmonary effort is normal.  Abdominal:     General: Abdomen is flat.     Comments: RLQ Urostomy pink and patent with scant mucus and copious non-foul urine.   Genitourinary:    Comments: Left neph tube in place with non-foul urine.  Musculoskeletal:     Cervical back: Normal range of motion.  Skin:    General: Skin is warm.  Neurological:     General: No focal deficit present.     Mental Status: He is alert.  Psychiatric:        Mood and Affect: Mood normal.      Assessment/Plan  Proceed as planned with left PCNL with goal of stone free after prior cystectomy. Will likely leave neph tube in place post-op, and remove in outpatinet setting at f/u. He certainly does have some infecitos risk, but this has been optimized as much as possible. Additional Risks, benefits, alternatives, expected peri-op course discussed previouslya nd reiterated today.   Alexis Frock, MD 05/18/2020, 6:43 AM

## 2020-05-18 NOTE — OR Nursing (Signed)
Stone taken per Dr. Tresa Moore

## 2020-05-18 NOTE — Anesthesia Postprocedure Evaluation (Signed)
Anesthesia Post Note  Patient: Shane [name]  Procedure(s) Performed: NEPHROLITHOTOMY PERCUTANEOUS (Left ) ANTEGRADE URETEROSCOPY WITH STENT PLACEMENT (Left )     Patient location during evaluation: PACU Anesthesia Type: General Level of consciousness: awake and alert Pain management: pain level controlled Vital Signs Assessment: post-procedure vital signs reviewed and stable Respiratory status: spontaneous breathing, nonlabored ventilation, respiratory function stable and patient connected to nasal cannula oxygen Cardiovascular status: blood pressure returned to baseline and stable Postop Assessment: no apparent nausea or vomiting Anesthetic complications: no   No complications documented.  Last Vitals:  Vitals:   05/18/20 1200 05/18/20 1300  BP: 124/77 119/82  Pulse: (!) 56 86  Resp: 19 18  Temp: 36.7 C 36.7 C  SpO2: 96% 100%    Last Pain:  Vitals:   05/18/20 1347  TempSrc:   PainSc: 0-No pain                 Jaretssi Kraker L Jhair Witherington

## 2020-05-18 NOTE — Brief Op Note (Signed)
05/18/2020  9:44 AM  PATIENT:  Shane [name]  84 y.o. male  PRE-OPERATIVE DIAGNOSIS:  LEFT RENAL STONE, HISTORY OF CYSTECTOMY  POST-OPERATIVE DIAGNOSIS:  LEFT RENAL STONE, HISTORY OF CYSTECTOMY  PROCEDURE:  Procedure(s) with comments: NEPHROLITHOTOMY PERCUTANEOUS (Left) - 2 HRS ANTEGRADE URETEROSCOPY WITH STENT PLACEMENT (Left)  SURGEON:  Surgeon(s) and Role:    * Alexis Frock, MD - Primary  PHYSICIAN ASSISTANT:   ASSISTANTS: none   ANESTHESIA:   general  EBL: 30mL   BLOOD ADMINISTERED:none  DRAINS: 1 - urostomy to gravity; 2 - Left nephroureteral sent, capped   LOCAL MEDICATIONS USED:  NONE  SPECIMEN:  Source of Specimen:  left renal stone  DISPOSITION OF SPECIMEN:  [facility] for compositional analysis  COUNTS:  YES  TOURNIQUET:  * No tourniquets in log *  DICTATION: .Other Dictation: Dictation Number [id number]  PLAN OF CARE: Admit to inpatient   PATIENT DISPOSITION:  PACU - hemodynamically stable.   Delay start of Pharmacological VTE agent (>24hrs) due to surgical blood loss or risk of bleeding: yes

## 2020-05-19 ENCOUNTER — Encounter (HOSPITAL_COMMUNITY): Payer: Self-pay | Admitting: Urology

## 2020-05-19 DIAGNOSIS — N189 Chronic kidney disease, unspecified: Secondary | ICD-10-CM | POA: Diagnosis not present

## 2020-05-19 DIAGNOSIS — C677 Malignant neoplasm of urachus: Secondary | ICD-10-CM | POA: Diagnosis not present

## 2020-05-19 DIAGNOSIS — E1129 Type 2 diabetes mellitus with other diabetic kidney complication: Secondary | ICD-10-CM | POA: Diagnosis not present

## 2020-05-19 DIAGNOSIS — I2510 Atherosclerotic heart disease of native coronary artery without angina pectoris: Secondary | ICD-10-CM | POA: Diagnosis not present

## 2020-05-19 DIAGNOSIS — I129 Hypertensive chronic kidney disease with stage 1 through stage 4 chronic kidney disease, or unspecified chronic kidney disease: Secondary | ICD-10-CM | POA: Diagnosis not present

## 2020-05-19 DIAGNOSIS — N200 Calculus of kidney: Secondary | ICD-10-CM | POA: Diagnosis not present

## 2020-05-19 LAB — BASIC METABOLIC PANEL
Anion gap: 12 (ref 5–15)
BUN: 41 mg/dL — ABNORMAL HIGH (ref 8–23)
CO2: 18 mmol/L — ABNORMAL LOW (ref 22–32)
Calcium: 8.4 mg/dL — ABNORMAL LOW (ref 8.9–10.3)
Chloride: 106 mmol/L (ref 98–111)
Creatinine, Ser: 1.68 mg/dL — ABNORMAL HIGH (ref 0.61–1.24)
GFR, Estimated: 40 mL/min — ABNORMAL LOW (ref >60)
Glucose, Bld: 160 mg/dL — ABNORMAL HIGH (ref 70–99)
Potassium: 4.5 mmol/L (ref 3.5–5.1)
Sodium: 136 mmol/L (ref 135–145)

## 2020-05-19 LAB — GLUCOSE, CAPILLARY: Glucose-Capillary: 132 mg/dL — ABNORMAL HIGH (ref 70–99)

## 2020-05-19 LAB — HEMOGLOBIN AND HEMATOCRIT, BLOOD
HCT: 36.7 % — ABNORMAL LOW (ref 39.0–52.0)
Hemoglobin: 11.7 g/dL — ABNORMAL LOW (ref 13.0–17.0)

## 2020-05-19 MED ORDER — CHLORHEXIDINE GLUCONATE CLOTH 2 % EX PADS: 6.0000 | MEDICATED_PAD | Freq: Every day | CUTANEOUS | Status: DC

## 2020-05-19 NOTE — Discharge Summary (Signed)
Date of admission: 05/18/2020  Date of discharge: 05/19/2020  Admission diagnosis: nephrolithiasis  Discharge diagnosis: nephrolithiasis  Secondary diagnoses: MIBC, CKD  History and Physical: For full details, please see admission history and physical. Briefly, Shane [name] is a 84 y.o. year old patient with history of MIBC s/p cystectomy and IC with nephrolithiasis.   He underwent a left PCNL on 05/18/20 and was transferred to the floor after PACU having tolerated the procedure well. On POD1 his urine output was adequate and clear yellow. Pain well controlled. Ambulating at baseline. Labs stable. Nephroureteral stent in place and capped with overlying dressing dry.  Plan to discharge home on POD1 with nephroureteral stent in capped and in place.  Laboratory values: Recent Labs    05/18/20 1017 05/19/20 0708  HGB 15.8 11.7*  HCT 48.8 36.7*   Recent Labs    05/19/20 0708  CREATININE 1.68*    Disposition: Home  Discharge instruction: The patient was instructed to be ambulatory but told to refrain from heavy lifting, strenuous activity, or driving.   Discharge medications:  Allergies as of 05/19/2020      Reactions   Other Swelling, Other (See Comments)   Pt states had swelling of the eye when having eye surgery and the anesthetic placed in eye- recovered the same day      Medication List    TAKE these medications   albuterol 108 (90 Base) MCG/ACT inhaler Commonly known as: VENTOLIN HFA Inhale 2 puffs into the lungs every 6 (six) hours as needed.   aspirin EC 81 MG tablet Take 1 tablet (81 mg total) by mouth daily.   ceFEPime  IVPB Commonly known as: MAXIPIME Inject 2 g into the vein daily for 12 days. Indication:  UTI First Dose: No Last Day of Therapy:  05/23/2020 Labs - Once weekly:  CBC/D and BMP, Labs - Every other week:  ESR and CRP Method of administration: IV Push Method of administration may be changed at the discretion of home infusion pharmacist  based upon assessment of the patient and/or caregiver's ability to self-administer the medication ordered.   cetirizine 10 MG tablet Commonly known as: ZYRTEC Take 1 tablet (10 mg total) by mouth daily.   ciprofloxacin 500 MG tablet Commonly known as: Cipro Take 1 tablet (500 mg total) by mouth 2 (two) times daily for 3 days. Begin day before next Urology appointment.   Ferrous Sulfate 27 MG Tabs Take 27 mg by mouth daily with breakfast.   FISH OIL PO Take 1 capsule by mouth daily.   FreeStyle Libre 14 Day Sensor Misc Inject 1 patch into the skin every 14 (fourteen) days.   insulin aspart 100 UNIT/ML FlexPen Commonly known as: NovoLOG FlexPen CBG < 70: implement hypoglycemia protocol CBG 70 - 120: 3 units CBG 121 - 150: 5 units CBG 151 - 200: 6 units CBG 201 - 250: 8 units CBG 251 - 300: 11 units CBG 301 - 350: 14 units CBG 351 - 400: 18 units CBG > 400: call MD What changed:   how much to take  how to take this  when to take this   Lancets 30G Misc Use to check blood sugar 4 times daily   Lantus SoloStar 100 UNIT/ML Solostar Pen Generic drug: insulin glargine INJECT 5 UNITS SUBQ AT BEDTIME   multivitamin with minerals Tabs tablet Take 1 tablet by mouth daily.   Pen Needles 32G X 4 MM Misc 1 each by Does not apply route daily.   Insulin  Pen Needle 31G X 5 MM Misc Inject insulin via insulin pen 3 x daily with meals   pravastatin 20 MG tablet Commonly known as: PRAVACHOL TAKE 1 TABLET BY MOUTH EVERY DAY   saccharomyces boulardii 250 MG capsule Commonly known as: FLORASTOR Take 1 capsule (250 mg total) by mouth 2 (two) times daily.   senna-docusate 8.6-50 MG tablet Commonly known as: Senokot-S Take 1 tablet by mouth 2 (two) times daily. While taking pain meds to prevent constipation   traMADol 50 MG tablet Commonly known as: Ultram Take 1 tablet (50 mg total) by mouth every 6 (six) hours as needed for moderate pain or severe pain. Post-operatively        Followup:

## 2020-05-21 DIAGNOSIS — Z436 Encounter for attention to other artificial openings of urinary tract: Secondary | ICD-10-CM | POA: Diagnosis not present

## 2020-05-21 DIAGNOSIS — C679 Malignant neoplasm of bladder, unspecified: Secondary | ICD-10-CM | POA: Diagnosis not present

## 2020-05-21 DIAGNOSIS — A4152 Sepsis due to Pseudomonas: Secondary | ICD-10-CM | POA: Diagnosis not present

## 2020-05-21 DIAGNOSIS — J181 Lobar pneumonia, unspecified organism: Secondary | ICD-10-CM | POA: Diagnosis not present

## 2020-05-21 DIAGNOSIS — A4181 Sepsis due to Enterococcus: Secondary | ICD-10-CM | POA: Diagnosis not present

## 2020-05-21 DIAGNOSIS — N136 Pyonephrosis: Secondary | ICD-10-CM | POA: Diagnosis not present

## 2020-05-21 DIAGNOSIS — N200 Calculus of kidney: Secondary | ICD-10-CM | POA: Diagnosis not present

## 2020-05-21 NOTE — Op Note (Signed)
NAMETREYSHAUN, KEATTS MEDICAL RECORD [id number] ACCOUNT [id number] DATE OF BIRTH:09-Feb-1936 FACILITY: [facility] LOCATION: [facility] PHYSICIAN:Prudencio Velazco, MD  OPERATIVE REPORT  DATE OF PROCEDURE:  05/18/2020  PREOPERATIVE DIAGNOSIS:  Left renal stone, history of urosepsis, history of bladder cancer.  PROCEDURES: 1.  Left percutaneous nephrostolithotomy, stone less than 2 cm. 2.  Left antegrade diagnostic ureteroscopy. 3.  Insertion of left ureteral stent.  ESTIMATED BLOOD LOSS:  Nil.  COMPLICATIONS:  None.  SPECIMENS:  Left renal stone for composition analysis.  FINDINGS: 1.  Significant inflammatory debris in the left kidney as expected. 2.  Dominant single left UPJ stone approximately 1 cm. 3.  Complete resolution of all accessible stone fragments larger than one-third mm following percutaneous nephrostolithotomy. 4.  No evidence of intraluminal obstruction with inspection of the left kidney and ureter through the ileal conduit.  INDICATIONS:  The patient is a pleasant 84 year old man with history of aggressive bladder cancer.  He is status post cystectomy approximately 2 years ago.  He has done very well from an oncologic perspective.  He was found on workup of flank pain and  fevers to have a left obstructing renal stone last month in the setting of urosepsis.  He underwent nephrostomy tube placement as a temporizing measure.  He has since cleared his infectious parameters and now presents for definitive management with  percutaneous nephrostolithotomy.  Informed consent was obtained and placed in medical record.  DESCRIPTION OF PROCEDURE:  The patient being identified, procedure being left PCNL was confirmed.  Procedure timeout was performed.  Intravenous antibiotics were administered.  General endotracheal anesthesia induced.  The patient was placed into a prone  position employing prone view, axillary roll, sequential compression devices, padding of his knees and elbows.   His right lower quadrant urostomy appliance was connected to straight drain after verifying no undue pressure on this and the left flank and  in situ nephrostomy tube was prepped and draped with chlorhexidine gluconate.  Initial antegrade nephrostogram revealed excellent placement of the nephrostomy tube within the lower mid kidney.  There was a calcification become filling defect consistent with known stone.  He has a ball valving type obstruction.  Next, a 0.038  ZIPwire was navigated to the level of the renal pelvis and coiled and using a KMP catheter for navigation the wire was navigated down the ureter to the level of the conduit and exchanged for a Super Stiff wire via the KMP catheter.  Next, a skin incision  approximately 1 cm was made at the previous nephrostomy site and a peelaway sheath was introduced to the level of the proximal ureter.  Another ZIPwire was advanced to the level of the conduit and exchanged for a second Super Stiff catheter via the KMP  catheter.  Having obtained dual Super Stiff access to the kidney one wire was set aside as a safety wire and the 30-French NephroMax dilation apparatus was carefully advanced across the lower pole calyx under fluoroscopic guidance and inflated to a  pressure of 20 atmospheres, held for 90 seconds and the sheath was carefully applied.  Using fluoroscopic guidance across the lower pole calyx.  Next, rigid nephroscopy was performed.  This revealed excellent placement of the sheath within the lower mid  calyx.  There was a very large amount of postinfectious debris within the kidney and very careful removal of this was performed using rigid instrumentation for approximately 25 minutes.  This then allowed excellent visualization of the lower pole  infundibulum in renal pelvis and  the single dominant stone was easily seen.  Fortunately, it was amenable to rigid nephroscopic grasping.  It was removed and set aside for composition analysis.  Next,  flexible nephroscopy was performed using a 16-French  flexible cystoscope and all accessible calyces were inspected x3.  There is no evidence of additional intraluminal stone material.  Visualizing the area of the UPJ a sensor wire was advanced down to the level of the conduit and the cystoscope was  exchanged for a single channel flexible ureteroscope to the level of the conduit and antegrade ureteroscopy was performed.  This revealed wide open ureter and ureteroenteric anastomosis.  No evidence of intraluminal obstruction from tumor or stone.  We  achieved the goals of the procedure today with stone removal and ensuring ureteral patency.  The KMP catheter was once again advanced acting as nephroureteral stent.  Access sheath was removed as was the safety wire.  The tract was infiltrated with  Surgiflo and reapproximated the skin using interrupted 2-0 Vicryl x4.  Nephrostomy tube was capped and further sutured in place using a 3-0 nylon.  A percutaneous dressing was applied.  Procedure was terminated.  The patient tolerated the procedure well.   No immediate complications.  The patient was taken to postanesthesia care unit in stable condition.  Plan for overnight admission, likely discharge home tomorrow.  HN/NUANCE  D:05/18/2020 T:05/18/2020 JOB:[id number]

## 2020-05-23 ENCOUNTER — Telehealth: Payer: Self-pay

## 2020-05-23 NOTE — Telephone Encounter (Signed)
Home health orders received 05/23/20 for [facility] initiation orders: Yes.  Home health re-certification orders: No. Patient last seen by ordering physician for this condition: 04/23/20. Must be less than 90 days for re-certification and less than 30 days prior for initiation. Visit must have been for the condition the orders are being placed.  Patient meets criteria for Physician to sign orders: Yes.        Current med list has been attached: Yes        Orders placed on physicians desk for signature: 05/23/20 (date) If patient does not meet criteria for orders to be signed: pt was called to schedule appt. Next provide appt is scheduled for 08/24/20.   Emilee Hero

## 2020-05-24 DIAGNOSIS — J181 Lobar pneumonia, unspecified organism: Secondary | ICD-10-CM | POA: Diagnosis not present

## 2020-05-24 DIAGNOSIS — Z436 Encounter for attention to other artificial openings of urinary tract: Secondary | ICD-10-CM | POA: Diagnosis not present

## 2020-05-24 DIAGNOSIS — A4152 Sepsis due to Pseudomonas: Secondary | ICD-10-CM | POA: Diagnosis not present

## 2020-05-24 DIAGNOSIS — A4181 Sepsis due to Enterococcus: Secondary | ICD-10-CM | POA: Diagnosis not present

## 2020-05-24 DIAGNOSIS — C679 Malignant neoplasm of bladder, unspecified: Secondary | ICD-10-CM | POA: Diagnosis not present

## 2020-05-24 DIAGNOSIS — N136 Pyonephrosis: Secondary | ICD-10-CM | POA: Diagnosis not present

## 2020-05-28 DIAGNOSIS — N136 Pyonephrosis: Secondary | ICD-10-CM | POA: Diagnosis not present

## 2020-05-28 DIAGNOSIS — J181 Lobar pneumonia, unspecified organism: Secondary | ICD-10-CM | POA: Diagnosis not present

## 2020-05-28 DIAGNOSIS — Z436 Encounter for attention to other artificial openings of urinary tract: Secondary | ICD-10-CM | POA: Diagnosis not present

## 2020-05-28 DIAGNOSIS — A4181 Sepsis due to Enterococcus: Secondary | ICD-10-CM | POA: Diagnosis not present

## 2020-05-28 DIAGNOSIS — A4152 Sepsis due to Pseudomonas: Secondary | ICD-10-CM | POA: Diagnosis not present

## 2020-05-28 DIAGNOSIS — C679 Malignant neoplasm of bladder, unspecified: Secondary | ICD-10-CM | POA: Diagnosis not present

## 2020-05-29 ENCOUNTER — Telehealth: Payer: Self-pay

## 2020-05-29 NOTE — Telephone Encounter (Signed)
Home health orders received 05/26/20 for [facility] initiation orders: No.  Home health re-certification orders: Yes. Patient last seen by ordering physician for this condition: 04/23/20. Must be less than 90 days for re-certification and less than 30 days prior for initiation. Visit must have been for the condition the orders are being placed.  Patient meets criteria for Physician to sign orders: Yes.        Current med list has been attached: Yes        Orders placed on physicians desk for signature: 05/29/20 (date) If patient does not meet criteria for orders to be signed: pt was called to schedule appt. Appt is scheduled for 08/24/20.   Emilee Hero

## 2020-05-30 NOTE — Telephone Encounter (Signed)
Signed and put in box to go up front. Signed:  Phil Rosamaria Donn, MD           05/30/2020  

## 2020-06-04 DIAGNOSIS — N200 Calculus of kidney: Secondary | ICD-10-CM | POA: Diagnosis not present

## 2020-06-05 DIAGNOSIS — J181 Lobar pneumonia, unspecified organism: Secondary | ICD-10-CM | POA: Diagnosis not present

## 2020-06-05 DIAGNOSIS — A4181 Sepsis due to Enterococcus: Secondary | ICD-10-CM | POA: Diagnosis not present

## 2020-06-05 DIAGNOSIS — C679 Malignant neoplasm of bladder, unspecified: Secondary | ICD-10-CM | POA: Diagnosis not present

## 2020-06-05 DIAGNOSIS — A4152 Sepsis due to Pseudomonas: Secondary | ICD-10-CM | POA: Diagnosis not present

## 2020-06-05 DIAGNOSIS — N136 Pyonephrosis: Secondary | ICD-10-CM | POA: Diagnosis not present

## 2020-06-05 DIAGNOSIS — Z436 Encounter for attention to other artificial openings of urinary tract: Secondary | ICD-10-CM | POA: Diagnosis not present

## 2020-06-12 DIAGNOSIS — J181 Lobar pneumonia, unspecified organism: Secondary | ICD-10-CM | POA: Diagnosis not present

## 2020-06-12 DIAGNOSIS — C679 Malignant neoplasm of bladder, unspecified: Secondary | ICD-10-CM | POA: Diagnosis not present

## 2020-06-12 DIAGNOSIS — A4152 Sepsis due to Pseudomonas: Secondary | ICD-10-CM | POA: Diagnosis not present

## 2020-06-12 DIAGNOSIS — A4181 Sepsis due to Enterococcus: Secondary | ICD-10-CM | POA: Diagnosis not present

## 2020-06-12 DIAGNOSIS — Z436 Encounter for attention to other artificial openings of urinary tract: Secondary | ICD-10-CM | POA: Diagnosis not present

## 2020-06-12 DIAGNOSIS — N136 Pyonephrosis: Secondary | ICD-10-CM | POA: Diagnosis not present

## 2020-06-19 ENCOUNTER — Encounter: Payer: Self-pay | Admitting: Family Medicine

## 2020-08-24 ENCOUNTER — Ambulatory Visit: Payer: Medicare Other | Admitting: Family Medicine

## 2020-09-03 ENCOUNTER — Other Ambulatory Visit: Payer: Self-pay

## 2020-09-03 ENCOUNTER — Encounter: Payer: Self-pay | Admitting: Family Medicine

## 2020-09-03 ENCOUNTER — Ambulatory Visit (INDEPENDENT_AMBULATORY_CARE_PROVIDER_SITE_OTHER): Payer: Medicare Other | Admitting: Family Medicine

## 2020-09-03 VITALS — BP 144/84 | HR 65 | Temp 98.0°F | Resp 16 | Ht 67.0 in | Wt 190.0 lb

## 2020-09-03 DIAGNOSIS — I10 Essential (primary) hypertension: Secondary | ICD-10-CM

## 2020-09-03 DIAGNOSIS — E118 Type 2 diabetes mellitus with unspecified complications: Secondary | ICD-10-CM

## 2020-09-03 DIAGNOSIS — I872 Venous insufficiency (chronic) (peripheral): Secondary | ICD-10-CM

## 2020-09-03 DIAGNOSIS — E7800 Pure hypercholesterolemia, unspecified: Secondary | ICD-10-CM

## 2020-09-03 LAB — COMPREHENSIVE METABOLIC PANEL
ALT: 17 U/L (ref 0–53)
AST: 12 U/L (ref 0–37)
Albumin: 4.2 g/dL (ref 3.5–5.2)
Alkaline Phosphatase: 88 U/L (ref 39–117)
BUN: 35 mg/dL — ABNORMAL HIGH (ref 6–23)
CO2: 14 mEq/L — ABNORMAL LOW (ref 19–32)
Calcium: 8.2 mg/dL — ABNORMAL LOW (ref 8.4–10.5)
Chloride: 113 mEq/L — ABNORMAL HIGH (ref 96–112)
Creatinine, Ser: 1.87 mg/dL — ABNORMAL HIGH (ref 0.40–1.50)
GFR: 32.97 mL/min — ABNORMAL LOW (ref >60.00)
Glucose, Bld: 105 mg/dL — ABNORMAL HIGH (ref 70–99)
Potassium: 3.4 mEq/L — ABNORMAL LOW (ref 3.5–5.1)
Sodium: 140 mEq/L (ref 135–145)
Total Bilirubin: 0.4 mg/dL (ref 0.2–1.2)
Total Protein: 7 g/dL (ref 6.0–8.3)

## 2020-09-03 LAB — LIPID PANEL
Cholesterol: 126 mg/dL (ref 0–200)
HDL: 49.1 mg/dL (ref >39.00)
LDL Cholesterol: 60 mg/dL (ref 0–99)
NonHDL: 76.6
Total CHOL/HDL Ratio: 3
Triglycerides: 84 mg/dL (ref 0.0–149.0)
VLDL: 16.8 mg/dL (ref 0.0–40.0)

## 2020-09-03 LAB — HEMOGLOBIN A1C: Hgb A1c MFr Bld: 6.1 % (ref 4.6–6.5)

## 2020-09-03 MED ORDER — PRAVASTATIN SODIUM 20 MG PO TABS: 20.0000 mg | ORAL_TABLET | Freq: Every day | ORAL | 3 refills | Status: AC

## 2020-09-03 MED ORDER — FREESTYLE LIBRE 14 DAY SENSOR MISC: 1 refills | Status: AC

## 2020-09-03 NOTE — Addendum Note (Signed)
Addended by: Octaviano Glow on: 09/03/2020 02:25 PM   Modules accepted: Orders

## 2020-09-03 NOTE — Progress Notes (Signed)
OFFICE VISIT  09/03/2020  CC:  Chief Complaint  Patient presents with  . Follow-up    RCI, 4 mo. Pt is not fasting    HPI:    Patient is a 85 y.o. Caucasian male who presents accompanied by his wife Shane [name] for 4 mo f/u chronic medical issues. A/P as of last visit: "1) Pyelonephritis with sepsis secondary to obstructing L renal stone (pseudomonas and enterococcus).  He finished his zosyn at home today. Doing very well at this time.  HH did cbc today and we'll get results in 1-2d. HH PT briefly but not anymore-->he does the exercises daily. Has urology f/u tomorrow (Dr. Tresa Moore): Plan is to go into PCN tube/ureteral stent to break up and retrieve the stone.  2) DM, diet controlled. He and his wife follow glucoses closely and have insulin they use wisely and sparingly---but no insulin requirements at all lately.  3) AKI superimposed on CRI III: improved with IVF and insertion of PCN tube and ureteral stent in hosp. HH did CBC and BMET already--we'll get results from this in the next 1-2 days. Avoid NSAIDs and drink lots of fluids.  Plan repeat CBC, CMET, A1c with me in 4 mo"  INTERIM HX: Doing fine.  L ureteral stone was extracted. Urine plentiful and light yellow. He is in no pain.    Currently glucose is 88, he last ate 6 hrs ago. Not on any insulin b/c glucoses consistently 100 or less fasting and <160 2H PP. Has not had any insulin in "months".  Drinks lots of clear fluids. Does not limit Na intake.  Has compression stockings but wife states he won't wear them. Elevates legs only when sleeping at night.  Home bp monitoring: consistently 120s/80.  ROS: no fevers, no CP, no SOB, no wheezing, no cough, no dizziness, no HAs, no rashes, no melena/hematochezia.  No polyuria or polydipsia.  No myalgias or arthralgias.  No focal weakness, paresthesias, or tremors.  No acute vision or hearing abnormalities. No n/v/d or abd pain.  No palpitations.     Past Medical History:   Diagnosis Date  . Abnormal EKG 2014   This led to stress test which was abnl, which led to cardiology referral: cath showed small vessel dz of 2nd diag branch with 70-80% stenosis--preserved LV function  . Aortic atherosclerosis ([place])    w/out aneurism  . Bilateral renal cysts    Non-complex (CT 03/2016): no enhancement, coarse calcifications, mass effect does give false appearance of hydro on non-contract series (followed by urol).  . Bladder cancer ([place]) 03/2016   Invasive high grade urothelial carcinoma.  No mets at dx.  Routine surveillance by urol (Dr. Tresa Moore), most recent 05/2020--no sign of recurrence--obs status  . CAD (coronary artery disease)    small vessel dz x 1 vessel on cath 2014: med mgmt  . Cerebellar cerebrovascular accident (CVA) without late effect 10/2017   Left, punctate: DAPT therapy x 3 wks per Dr. Leonie Man, neuro, then ASA 81mg  alone.  Stable as of 11/2017 f/u with neuro, Dr. Erlinda Hong.  . Chronic calcific pancreatitis ([place]) 03/2016   +stable on f/u imaging 11/2018, + 2 small pseudocysts  . Chronic renal insufficiency, stage 3 (moderate) ([facility]) 2021   GFR 50s  . Diabetes mellitus without complication ([place])    Insulin dependent; DKA admission 10/2017.  . GI bleed 12/2017   Presented with melena and Hb around 5.  Transfused 5 U pRBCs, started to get hematochezia in hosp: no clear explanation for GI  bleeding was found on EGD or colonoscopy.  CT abd w/out acute abnormality.  Upper GI w/SBFT showed subtle duodenal erosion.  [name] Heart murmur, systolic    Noted in prior PCP's records.  I recommended echocardiogram 07/2016 but pt declined.  [name] History of Bell's palsy   . History of chemotherapy   . History of kidney stones   . Hyperlipidemia   . Hypertension   . IDA (iron deficiency anemia) 12/2017   GI bleed  . Moderate aortic stenosis    Echo 03/2019. Pt asymptomatic.  Rpt 1 yr.   . Nephrolithiasis 03/2020   1 cm L renal pelvis stone-->hydronephrosis and pyelonephritis->sepsis  (enterococcus and pseudomonas).  . Pancreas cyst 03/2016   CT abd/pelv 12/2017 for GI bleed w/u showed 1.6 cm pancreatic head cyst-->stable and likely benign.  Abd MRI 11/2018->stable pancreatic pseudocysts + chronic pancreatitis changes. Rpt MRI abd w &w/out contrast 2 yrs (approx 11/2020).  . Pneumonia    with Covid  . Presence of urostomy [facility])    ileal conduit urinary diversion with bricker-type refluxing anastamoses at time of cystectomy 2018.  . Sepsis ([place]) 03/2020   pseudomonas and enterococcus.  He had large L renal stone causing hydronephrosis->PCN was done.  [name] Umbilical hernia    Repaired when he got his bladder surgery.    Past Surgical History:  Procedure Laterality Date  . BIOPSY  12/18/2017   Procedure: BIOPSY;  Surgeon: Doran Stabler, MD;  Location: [facility];  Service: Gastroenterology;;  . CARDIAC CATHETERIZATION  02/09/2013   small vessel dz of 2nd diag branch with 70-80% stenosis--preserved LV function. Medical therapy rec'd. Agustin Cree, PA)  . CARDIOVASCULAR STRESS TEST  01/26/2013   No ischemia.  Wall motion abnormalities at inferior base suggesting small area of infarction.  EF 64%.  . CAROTID ARTERY DOPPLERS  10/15/2017   1-39% stenosis R ICA, no stenosis L ICA.  [name] CATARACT EXTRACTION    . COLONOSCOPY N/A 12/19/2017   Clotted blood in cecum w/out any underlying abnormality found -->entired colon and terminal ilium normal.  Procedure: COLONOSCOPY;  Surgeon: Doran Stabler, MD;  Location: [facility];  Service: Gastroenterology;  Laterality: N/A;  . cystoprostatectomy w/LN surgery  10/17/2016   Ileal conduit: this is an incontinent urine diversion that directs urine from the ureters through a segment of isolated bowel to the surface of the abdominal wall via a cutaneous stoma, and urine drains continuously into an external ostomy appliance.  . CYSTOSCOPY WITH INJECTION N/A 10/17/2016   Procedure: CYSTOSCOPY WITH INJECTION OF INDOCYANINE GREEN DYE;  Surgeon:  Alexis Frock, MD;  Location: [facility];  Service: Urology;  Laterality: N/A;  . ESOPHAGOGASTRODUODENOSCOPY N/A 12/18/2017   Mild chronic gastritis, no metaplasia or dysplasia.  H pylori pending as of 12/21/17.  nonbleeding duodenal ulcers.  Procedure: ESOPHAGOGASTRODUODENOSCOPY (EGD);  Surgeon: Doran Stabler, MD;  Location: [facility];  Service: Gastroenterology;  Laterality: N/A;  . EYE SURGERY     cataract surgery bilat   . IR FLUORO GUIDE CV LINE RIGHT  04/17/2020  . IR GENERIC HISTORICAL  05/12/2016   IR US GUIDE VASC ACCESS RIGHT 05/12/2016 Greggory Keen, MD [facility]  . IR GENERIC HISTORICAL  05/12/2016   IR FLUORO GUIDE PORT INSERTION RIGHT 05/12/2016 Greggory Keen, MD [facility]  . IR NEPHROSTOMY PLACEMENT LEFT  04/13/2020  . IR REMOVAL TUN ACCESS W/ PORT W/O FL MOD SED  07/03/2017  . IR REMOVAL TUN CV CATH W/O FL  05/03/2020  .  IR US GUIDE VASC ACCESS RIGHT  04/17/2020  . NEPHROLITHOTOMY Left 05/18/2020   Procedure: NEPHROLITHOTOMY PERCUTANEOUS;  Surgeon: Alexis Frock, MD;  Location: [facility];  Service: Urology;  Laterality: Left;  2 HRS  . OSTOMY     urostomy  . TONSILLECTOMY AND ADENOIDECTOMY  Remote  . TRANSTHORACIC ECHOCARDIOGRAM  10/16/2017; 03/2019   10/2017 EF 65-70%, normal wall motion, grd I DD.  03/2019 same but also new moderate aortic stenosis. Rpt echo 1 yr.  . TRANSURETHRAL RESECTION OF PROSTATE N/A 04/02/2016   Procedure: CYSTOSCOPY, TRANSURETHRAL RESECTION OF BLADDER TUMOR;  Surgeon: [name], MD;  Location: [facility];  Service: Urology;  Laterality: N/A;  . UMBILICAL HERNIA REPAIR  10/2016  . VASECTOMY      Outpatient Medications Prior to Visit  Medication Sig Dispense Refill  . Continuous Blood Gluc Sensor (FREESTYLE LIBRE 14 DAY SENSOR) MISC Inject 1 patch into the skin every 14 (fourteen) days. 1 each 12  . Omega-3 Fatty Acids (FISH OIL PO) Take 1 capsule by mouth daily.     [name] albuterol (VENTOLIN HFA) 108 (90 Base) MCG/ACT inhaler Inhale 2 puffs  into the lungs every 6 (six) hours as needed. (Patient not taking: No sig reported) 18 g 0  . aspirin EC 81 MG tablet Take 1 tablet (81 mg total) by mouth daily. (Patient not taking: No sig reported) 30 tablet 0  . cetirizine (ZYRTEC) 10 MG tablet Take 1 tablet (10 mg total) by mouth daily. (Patient not taking: No sig reported) 30 tablet 11  . insulin aspart (NOVOLOG FLEXPEN) 100 UNIT/ML FlexPen CBG < 70: implement hypoglycemia protocol CBG 70 - 120: 3 units CBG 121 - 150: 5 units CBG 151 - 200: 6 units CBG 201 - 250: 8 units CBG 251 - 300: 11 units CBG 301 - 350: 14 units CBG 351 - 400: 18 units CBG > 400: call MD (Patient not taking: Reported on 09/03/2020) 15 mL 6  . Insulin Glargine (LANTUS SOLOSTAR) 100 UNIT/ML Solostar Pen INJECT 5 UNITS SUBQ AT BEDTIME (Patient not taking: No sig reported) 15 mL 3  . Insulin Pen Needle (PEN NEEDLES) 32G X 4 MM MISC 1 each by Does not apply route daily. (Patient not taking: Reported on 09/03/2020) 100 each 11  . Insulin Pen Needle 31G X 5 MM MISC Inject insulin via insulin pen 3 x daily with meals (Patient not taking: Reported on 09/03/2020) 100 each 3  . Lancets 30G MISC Use to check blood sugar 4 times daily (Patient not taking: Reported on 09/03/2020) 100 each 0  . Multiple Vitamin (MULTIVITAMIN WITH MINERALS) TABS tablet Take 1 tablet by mouth daily. (Patient not taking: Reported on 09/03/2020)    . Ferrous Sulfate 27 MG TABS Take 27 mg by mouth daily with breakfast. (Patient not taking: Reported on 09/03/2020)    . pravastatin (PRAVACHOL) 20 MG tablet TAKE 1 TABLET BY MOUTH EVERY DAY (Patient not taking: Reported on 09/03/2020) 30 tablet 0  . saccharomyces boulardii (FLORASTOR) 250 MG capsule Take 1 capsule (250 mg total) by mouth 2 (two) times daily. (Patient not taking: Reported on 09/03/2020) 30 capsule 0  . senna-docusate (SENOKOT-S) 8.6-50 MG tablet Take 1 tablet by mouth 2 (two) times daily. While taking pain meds to prevent constipation (Patient not  taking: Reported on 09/03/2020) 10 tablet 0  . traMADol (ULTRAM) 50 MG tablet Take 1 tablet (50 mg total) by mouth every 6 (six) hours as needed for moderate pain or severe pain. Post-operatively (Patient not  taking: Reported on 09/03/2020) 15 tablet 0   No facility-administered medications prior to visit.    Allergies  Allergen Reactions  . Other Swelling and Other (See Comments)    Pt states had swelling of the eye when having eye surgery and the anesthetic placed in eye- recovered the same day    ROS As per HPI  PE: Vitals with BMI 09/03/2020 05/19/2020 05/19/2020  Height 5\' 7"  - -  Weight 190 lbs - -  BMI 58.85 - -  Systolic 027 741 287  Diastolic 84 79 68  Pulse 65 68 71     Gen: Alert, well appearing.  Patient is oriented to person, place, time, and situation. AFFECT: pleasant, lucid thought and speech. CV: RRR, soft systolic murmur, no r/g.   LUNGS: CTA bilat, nonlabored resps, good aeration in all lung fields. EXT: no clubbing or cyanosis.  4+ bilat LL pitting edema.  No erythema or skin breakdown.  LABS:  Lab Results  Component Value Date   TSH 1.084 10/14/2017   Lab Results  Component Value Date   WBC 10.9 (H) 05/11/2020   HGB 11.7 (L) 05/19/2020   HCT 36.7 (L) 05/19/2020   MCV 93.3 05/11/2020   PLT 185 05/11/2020  No results found for: IRON, TIBC, FERRITIN Lab Results  Component Value Date   CREATININE 1.68 (H) 05/19/2020   BUN 41 (H) 05/19/2020   NA 136 05/19/2020   K 4.5 05/19/2020   CL 106 05/19/2020   CO2 18 (L) 05/19/2020   Lab Results  Component Value Date   ALT 12 05/10/2020   AST 12 (L) 05/10/2020   ALKPHOS 62 05/10/2020   BILITOT 0.5 05/10/2020   Lab Results  Component Value Date   CHOL 119 12/07/2018   Lab Results  Component Value Date   HDL 34.90 (L) 12/07/2018   Lab Results  Component Value Date   LDLCALC 60 12/07/2018   Lab Results  Component Value Date   TRIG 116.0 12/07/2018   Lab Results  Component Value Date    CHOLHDL 3 12/07/2018   Lab Results  Component Value Date   HGBA1C 6.3 (H) 04/09/2020   IMPRESSION AND PLAN:  1) DM 2, no meds currently but has had to be on insulin in the past. Home monitoring shows great glucoses. A1c today.  2) HLD: was on pravastatin but when ran out he never RF'd it. get back on pravastatin. FLP and hepatic panel today.  3) CRI III: good hydration habits. No NSAIDs. Lytes/cr today.  4) LE edema secondary to chronic venous insufficiency: poor control. Discussed sodium restriction, elevation of legs bid x 78min, wear compression stockings (has these).  5) HTN: bp normal consistently at home on no meds now. Mild elevation here. No meds at this time. Cont home monitoring.  An After Visit Summary was printed and given to the patient.  FOLLOW UP: Return in about 3 months (around 12/01/2020) for routine chronic illness f/u.  Signed:  Crissie Sickles, MD           09/03/2020

## 2020-09-04 ENCOUNTER — Other Ambulatory Visit: Payer: Self-pay

## 2020-09-04 DIAGNOSIS — N200 Calculus of kidney: Secondary | ICD-10-CM

## 2020-09-04 DIAGNOSIS — N183 Chronic kidney disease, stage 3 unspecified: Secondary | ICD-10-CM

## 2020-09-04 MED ORDER — POTASSIUM CHLORIDE CRYS ER 20 MEQ PO TBCR: 20.0000 meq | EXTENDED_RELEASE_TABLET | Freq: Two times a day (BID) | ORAL | 6 refills | Status: AC

## 2020-09-10 DIAGNOSIS — N184 Chronic kidney disease, stage 4 (severe): Secondary | ICD-10-CM | POA: Diagnosis not present

## 2020-09-10 DIAGNOSIS — C672 Malignant neoplasm of lateral wall of bladder: Secondary | ICD-10-CM | POA: Diagnosis not present

## 2020-09-10 DIAGNOSIS — N200 Calculus of kidney: Secondary | ICD-10-CM | POA: Diagnosis not present

## 2020-09-17 ENCOUNTER — Encounter: Payer: Self-pay | Admitting: Family Medicine

## 2020-11-29 ENCOUNTER — Encounter: Payer: Self-pay | Admitting: Family Medicine

## 2020-11-29 ENCOUNTER — Ambulatory Visit (INDEPENDENT_AMBULATORY_CARE_PROVIDER_SITE_OTHER): Payer: Medicare Other | Admitting: Family Medicine

## 2020-11-29 ENCOUNTER — Other Ambulatory Visit: Payer: Self-pay

## 2020-11-29 VITALS — BP 152/84 | HR 66 | Temp 97.8°F | Resp 16 | Ht 67.0 in | Wt 189.4 lb

## 2020-11-29 DIAGNOSIS — E119 Type 2 diabetes mellitus without complications: Secondary | ICD-10-CM | POA: Diagnosis not present

## 2020-11-29 DIAGNOSIS — N183 Chronic kidney disease, stage 3 unspecified: Secondary | ICD-10-CM | POA: Diagnosis not present

## 2020-11-29 DIAGNOSIS — I872 Venous insufficiency (chronic) (peripheral): Secondary | ICD-10-CM

## 2020-11-29 DIAGNOSIS — I10 Essential (primary) hypertension: Secondary | ICD-10-CM | POA: Diagnosis not present

## 2020-11-29 DIAGNOSIS — N2889 Other specified disorders of kidney and ureter: Secondary | ICD-10-CM

## 2020-11-29 DIAGNOSIS — R011 Cardiac murmur, unspecified: Secondary | ICD-10-CM

## 2020-11-29 DIAGNOSIS — E7800 Pure hypercholesterolemia, unspecified: Secondary | ICD-10-CM | POA: Diagnosis not present

## 2020-11-29 DIAGNOSIS — I350 Nonrheumatic aortic (valve) stenosis: Secondary | ICD-10-CM | POA: Diagnosis not present

## 2020-11-29 LAB — BASIC METABOLIC PANEL
BUN: 46 mg/dL — ABNORMAL HIGH (ref 6–23)
CO2: 17 mEq/L — ABNORMAL LOW (ref 19–32)
Calcium: 8.1 mg/dL — ABNORMAL LOW (ref 8.4–10.5)
Chloride: 113 mEq/L — ABNORMAL HIGH (ref 96–112)
Creatinine, Ser: 1.8 mg/dL — ABNORMAL HIGH (ref 0.40–1.50)
GFR: 34.46 mL/min — ABNORMAL LOW (ref >60.00)
Glucose, Bld: 117 mg/dL — ABNORMAL HIGH (ref 70–99)
Potassium: 4.2 mEq/L (ref 3.5–5.1)
Sodium: 141 mEq/L (ref 135–145)

## 2020-11-29 LAB — HEMOGLOBIN A1C: Hgb A1c MFr Bld: 6.3 % (ref 4.6–6.5)

## 2020-11-29 NOTE — Progress Notes (Signed)
OFFICE VISIT  11/29/2020  CC:  Chief Complaint  Patient presents with  . Follow-up    DM, HTN. Pt is not fasting   HPI:    Patient is a 85 y.o. Caucasian male who presents accompanied by his wife Shane [name] for 3 mo f/u DM, HTN, HLD, bilat LE edema from chronic venous insufficiency, and CRI III. A/P as of last visit: "1) DM 2, no meds currently but has had to be on insulin in the past. Home monitoring shows great glucoses. A1c today.  2) HLD: was on pravastatin but when ran out he never RF'd it. get back on pravastatin. FLP and hepatic panel today.  3) CRI III: good hydration habits. No NSAIDs. Lytes/cr today.  4) LE edema secondary to chronic venous insufficiency: poor control. Discussed sodium restriction, elevation of legs bid x 51min, wear compression stockings (has these).  5) HTN: bp normal consistently at home on no meds now. Mild elevation here. No meds at this time. Cont home monitoring."  INTERIM HX: Feeling fine. The extent of his activity level is walking around his property daily.  No signif exertion at all.   No dizziness or presyncope.  HTN: home bp's consistently <130 syst, 80s diast.  Taking pravast 20mg  every day.  LE edema: no added salt.  Focuses on low Na foods. Drinks 90-100 oz water/day. Making lots of urine. Says legs swelling consistently better since using compression stockings daily.  Freestyle continuous monitoring: 80s fasting typically.  Not using ANY insulin and no diabetic pills either.  PP <130.  Wt stable.  Has appt with urol to get surveillance CT in July this summer.   Past Medical History:  Diagnosis Date  . Abnormal EKG 2014   This led to stress test which was abnl, which led to cardiology referral: cath showed small vessel dz of 2nd diag branch with 70-80% stenosis--preserved LV function  . Aortic atherosclerosis ([place])    w/out aneurism  . Bilateral renal cysts    Non-complex (CT 03/2016): no enhancement, coarse  calcifications, mass effect does give false appearance of hydro on non-contract series (followed by urol).  . Bladder cancer ([place]) 03/2016   Invasive high grade urothelial carcinoma.  No mets at dx.  Routine surveillance by urol (Dr. Tresa [name]), most recent 05/2020--no sign of recurrence--obs status  . CAD (coronary artery disease)    small vessel dz x 1 vessel on cath 2014: med mgmt  . Cerebellar cerebrovascular accident (CVA) without late effect 10/2017   Left, punctate: DAPT therapy x 3 wks per Dr. Leonie [name], neuro, then ASA 81mg  alone.  Stable as of 11/2017 f/u with neuro, Dr. Erlinda [name].  . Chronic calcific pancreatitis ([place]) 03/2016   +stable on f/u imaging 11/2018, + 2 small pseudocysts  . Chronic renal insufficiency, stage 4 (severe) ([facility]) 2021    suspect medical/renovasc dz b/c no change with decomp of partial obst 2021  . Diabetes mellitus without complication ([place])    Insulin dependent; DKA admission 10/2017.  . GI bleed 12/2017   Presented with melena and Hb around 5.  Transfused 5 U pRBCs, started to get hematochezia in hosp: no clear explanation for GI bleeding was found on EGD or colonoscopy.  CT abd w/out acute abnormality.  Upper GI w/SBFT showed subtle duodenal erosion.  [name] Heart murmur, systolic    Noted in prior PCP's records.  I recommended echocardiogram 07/2016 but pt declined.  [name] History of Bell's palsy   . History of chemotherapy   . History of  kidney stones   . Hyperlipidemia   . Hypertension   . IDA (iron deficiency anemia) 12/2017   GI bleed  . Moderate aortic stenosis    Echo 03/2019. Pt asymptomatic.  Rpt 1 yr.   . Nephrolithiasis 03/2020   1 cm L renal pelvis stone-->hydronephrosis and pyelonephritis->sepsis (enterococcus and pseudomonas).  . Pancreas cyst 03/2016   CT abd/pelv 12/2017 for GI bleed w/u showed 1.6 cm pancreatic head cyst-->stable and likely benign.  Abd MRI 11/2018->stable pancreatic pseudocysts + chronic pancreatitis changes. Rpt MRI abd w &w/out contrast 2 yrs  (approx 11/2020).  . Pneumonia    with Covid  . Presence of urostomy [facility])    ileal conduit urinary diversion with bricker-type refluxing anastamoses at time of cystectomy 2018.  . Sepsis ([place]) 03/2020   pseudomonas and enterococcus.  He had large L renal stone causing hydronephrosis->PCN was done.  [name] Umbilical hernia    Repaired when he got his bladder surgery.    Past Surgical History:  Procedure Laterality Date  . BIOPSY  12/18/2017   Procedure: BIOPSY;  Surgeon: Shane Stabler, MD;  Location: [facility];  Service: Gastroenterology;;  . CARDIAC CATHETERIZATION  02/09/2013   small vessel dz of 2nd diag branch with 70-80% stenosis--preserved LV function. Medical therapy rec'd. Shane Cree, PA)  . CARDIOVASCULAR STRESS TEST  01/26/2013   No ischemia.  Wall motion abnormalities at inferior base suggesting small area of infarction.  EF 64%.  . CAROTID ARTERY DOPPLERS  10/15/2017   1-39% stenosis R ICA, no stenosis L ICA.  [name] CATARACT EXTRACTION    . COLONOSCOPY N/A 12/19/2017   Clotted blood in cecum w/out any underlying abnormality found -->entired colon and terminal ilium normal.  Procedure: COLONOSCOPY;  Surgeon: Shane Stabler, MD;  Location: [facility];  Service: Gastroenterology;  Laterality: N/A;  . cystoprostatectomy w/LN surgery  10/17/2016   Ileal conduit: this is an incontinent urine diversion that directs urine from the ureters through a segment of isolated bowel to the surface of the abdominal wall via a cutaneous stoma, and urine drains continuously into an external ostomy appliance.  . CYSTOSCOPY WITH INJECTION N/A 10/17/2016   Procedure: CYSTOSCOPY WITH INJECTION OF INDOCYANINE GREEN DYE;  Surgeon: Shane Frock, MD;  Location: [facility];  Service: Urology;  Laterality: N/A;  . ESOPHAGOGASTRODUODENOSCOPY N/A 12/18/2017   Mild chronic gastritis, no metaplasia or dysplasia.  H pylori pending as of 12/21/17.  nonbleeding duodenal ulcers.  Procedure: ESOPHAGOGASTRODUODENOSCOPY  (EGD);  Surgeon: Shane Stabler, MD;  Location: [facility];  Service: Gastroenterology;  Laterality: N/A;  . EYE SURGERY     cataract surgery bilat   . IR FLUORO GUIDE CV LINE RIGHT  04/17/2020  . IR GENERIC HISTORICAL  05/12/2016   IR US GUIDE VASC ACCESS RIGHT 05/12/2016 Greggory Keen, MD [facility]  . IR GENERIC HISTORICAL  05/12/2016   IR FLUORO GUIDE PORT INSERTION RIGHT 05/12/2016 Greggory Keen, MD [facility]  . IR NEPHROSTOMY PLACEMENT LEFT  04/13/2020  . IR REMOVAL TUN ACCESS W/ PORT W/O FL MOD SED  07/03/2017  . IR REMOVAL TUN CV CATH W/O FL  05/03/2020  . IR US GUIDE VASC ACCESS RIGHT  04/17/2020  . NEPHROLITHOTOMY Left 05/18/2020   Procedure: NEPHROLITHOTOMY PERCUTANEOUS;  Surgeon: Shane Frock, MD;  Location: [facility];  Service: Urology;  Laterality: Left;  2 HRS  . OSTOMY     urostomy  . TONSILLECTOMY AND ADENOIDECTOMY  Remote  . TRANSTHORACIC ECHOCARDIOGRAM  10/16/2017; 03/2019  10/2017 EF 65-70%, normal wall motion, grd I DD.  03/2019 same but also new moderate aortic stenosis. Rpt echo 1 yr.  . TRANSURETHRAL RESECTION OF PROSTATE N/A 04/02/2016   Procedure: CYSTOSCOPY, TRANSURETHRAL RESECTION OF BLADDER TUMOR;  Surgeon: [name], MD;  Location: [facility];  Service: Urology;  Laterality: N/A;  . UMBILICAL HERNIA REPAIR  10/2016  . VASECTOMY      Outpatient Medications Prior to Visit  Medication Sig Dispense Refill  . Continuous Blood Gluc Sensor (FREESTYLE LIBRE 14 DAY SENSOR) MISC Inject 1 patch into the skin every 14 (fourteen) days. 3 each 1  . Omega-3 Fatty Acids (FISH OIL PO) Take 1 capsule by mouth daily.     . potassium chloride SA (KLOR-CON) 20 MEQ tablet Take 1 tablet (20 mEq total) by mouth 2 (two) times daily. 60 tablet 6  . pravastatin (PRAVACHOL) 20 MG tablet Take 1 tablet (20 mg total) by mouth daily. 90 tablet 3  . aspirin EC 81 MG tablet Take 1 tablet (81 mg total) by mouth daily. (Patient not taking: No sig reported) 30 tablet 0  . insulin  aspart (NOVOLOG FLEXPEN) 100 UNIT/ML FlexPen CBG < 70: implement hypoglycemia protocol CBG 70 - 120: 3 units CBG 121 - 150: 5 units CBG 151 - 200: 6 units CBG 201 - 250: 8 units CBG 251 - 300: 11 units CBG 301 - 350: 14 units CBG 351 - 400: 18 units CBG > 400: call MD (Patient not taking: No sig reported) 15 mL 6  . Insulin Glargine (LANTUS SOLOSTAR) 100 UNIT/ML Solostar Pen INJECT 5 UNITS SUBQ AT BEDTIME (Patient not taking: No sig reported) 15 mL 3  . Multiple Vitamin (MULTIVITAMIN WITH MINERALS) TABS tablet Take 1 tablet by mouth daily. (Patient not taking: No sig reported)    . albuterol (VENTOLIN HFA) 108 (90 Base) MCG/ACT inhaler Inhale 2 puffs into the lungs every 6 (six) hours as needed. (Patient not taking: No sig reported) 18 g 0  . cetirizine (ZYRTEC) 10 MG tablet Take 1 tablet (10 mg total) by mouth daily. (Patient not taking: No sig reported) 30 tablet 11  . Insulin Pen Needle (PEN NEEDLES) 32G X 4 MM MISC 1 each by Does not apply route daily. (Patient not taking: No sig reported) 100 each 11  . Insulin Pen Needle 31G X 5 MM MISC Inject insulin via insulin pen 3 x daily with meals (Patient not taking: No sig reported) 100 each 3  . Lancets 30G MISC Use to check blood sugar 4 times daily (Patient not taking: No sig reported) 100 each 0   No facility-administered medications prior to visit.    Allergies  Allergen Reactions  . Other Swelling and Other (See Comments)    Pt states had swelling of the eye when having eye surgery and the anesthetic placed in eye- recovered the same day    ROS As per HPI  PE: Vitals with BMI 11/29/2020 09/03/2020 05/19/2020  Height 5\' 7"  5\' 7"  -  Weight 189 lbs 6 oz 190 lbs -  BMI 00.93 81.82 -  Systolic 993 716 967  Diastolic 84 84 79  Pulse 66 65 68     Gen: Alert, well appearing.  Patient is oriented to person, place, time, and situation. AFFECT: pleasant, lucid thought and speech. CV: RRR, very soft systolic murmur at LUSB/RUSB,  distant heart sounds, no r/g.   LUNGS: CTA bilat, nonlabored resps, good aeration in all lung fields. EXT: no clubbing or cyanosis.  4+ pitting ankle edema on L LL, 3+ pitting ankle edema on R LL.  No skin breakdown or weeping.  No rash.   LABS:  Lab Results  Component Value Date   TSH 1.084 10/14/2017   Lab Results  Component Value Date   WBC 10.9 (H) 05/11/2020   HGB 11.7 (L) 05/19/2020   HCT 36.7 (L) 05/19/2020   MCV 93.3 05/11/2020   PLT 185 05/11/2020   Lab Results  Component Value Date   CREATININE 1.87 (H) 09/03/2020   BUN 35 (H) 09/03/2020   NA 140 09/03/2020   K 3.4 (L) 09/03/2020   CL 113 (H) 09/03/2020   CO2 14 (L) 09/03/2020   Lab Results  Component Value Date   ALT 17 09/03/2020   AST 12 09/03/2020   ALKPHOS 88 09/03/2020   BILITOT 0.4 09/03/2020   Lab Results  Component Value Date   CHOL 126 09/03/2020   Lab Results  Component Value Date   HDL 49.10 09/03/2020   Lab Results  Component Value Date   LDLCALC 60 09/03/2020   Lab Results  Component Value Date   TRIG 84.0 09/03/2020   Lab Results  Component Value Date   CHOLHDL 3 09/03/2020   Lab Results  Component Value Date   HGBA1C 6.1 09/03/2020   IMPRESSION AND PLAN:  1) DM 2: diet controlled and has not needed any insulin in quite a while now (will leave this on his med list, though). Hba1c and lytes/cr today.  2) HTN: home bp's consistently 130-135/80 or better. No meds at this time.  3) HLD: tolerating pravastatin 20 qd. Feb 2022 lipids excellent. Hepatic panel normal at that time.  4) Chronic bilat LE edema d/t chronic venous insufficiency: stable. Cont compression stockings, sodium limitation, periodic elevation.  5) CRI III: primarily from hx of obstructive uropathy. Has been stable since 04/2020. BMET today. Cont ongoing f/u with urology.  6) Systolic murmur: reviewed past echo's, recommended f/u echo but he declined. No sx's of signif cardiac/valvular dysfunction but  admittedly he does not exert himself at all. No dizziness or presyncope.  An After Visit Summary was printed and given to the patient.  FOLLOW UP: Return in about 4 months (around 04/01/2021) for routine chronic illness f/u.  Signed:  Crissie Sickles, MD           11/29/2020

## 2020-12-04 ENCOUNTER — Other Ambulatory Visit: Payer: Self-pay | Admitting: *Deleted

## 2020-12-04 NOTE — Patient Outreach (Signed)
[place] [facility]) Care Management  12/04/2020  Shane [name] [birth year] [id number]  Unsuccessful outreach attempt made to patient to discuss benefit [facility]. RN Health Coach left HIPAA compliant voicemail message along with her contact information.  Plan: RN Health Coach will call patient within the next several business days and will send an unsuccessful letter.  Emelia Loron RN, BSN [place] [phone] [email or web address]

## 2020-12-12 ENCOUNTER — Other Ambulatory Visit: Payer: Self-pay | Admitting: *Deleted

## 2020-12-12 NOTE — Patient Outreach (Signed)
[place] [facility]) Care Management  12/12/2020  Dmari Schubring 1935/12/14 [id number]  Unsuccessful outreach attempt made to patient. RN Health Coach left HIPAA compliant voicemail message along with her contact information.  Plan: RN Health Coach will call patient within the next several business days.  Emelia Loron RN, BSN [place] [phone] [email or web address]

## 2020-12-17 ENCOUNTER — Other Ambulatory Visit: Payer: Self-pay | Admitting: *Deleted

## 2020-12-17 NOTE — Patient Outreach (Signed)
[place] [facility]) Care Management  12/17/2020  Shane [name] 12/22/1935 [id number]  Unsuccessful outreach attempt made to patient. RN Health Coach left HIPAA compliant voicemail message along with her contact information.  Plan: RN Health Coach will call patient within the month of July.  Emelia Loron RN, BSN [place] [phone] [email or web address]

## 2021-01-09 DIAGNOSIS — I2510 Atherosclerotic heart disease of native coronary artery without angina pectoris: Secondary | ICD-10-CM | POA: Diagnosis not present

## 2021-01-09 DIAGNOSIS — Z8619 Personal history of other infectious and parasitic diseases: Secondary | ICD-10-CM | POA: Diagnosis not present

## 2021-01-09 DIAGNOSIS — Z8701 Personal history of pneumonia (recurrent): Secondary | ICD-10-CM | POA: Diagnosis not present

## 2021-01-09 DIAGNOSIS — Z906 Acquired absence of other parts of urinary tract: Secondary | ICD-10-CM | POA: Diagnosis not present

## 2021-01-09 DIAGNOSIS — Z87440 Personal history of urinary (tract) infections: Secondary | ICD-10-CM | POA: Diagnosis not present

## 2021-01-09 DIAGNOSIS — E1122 Type 2 diabetes mellitus with diabetic chronic kidney disease: Secondary | ICD-10-CM | POA: Diagnosis not present

## 2021-01-09 DIAGNOSIS — Z8719 Personal history of other diseases of the digestive system: Secondary | ICD-10-CM | POA: Diagnosis not present

## 2021-01-09 DIAGNOSIS — R680 Hypothermia, not associated with low environmental temperature: Secondary | ICD-10-CM | POA: Diagnosis not present

## 2021-01-09 DIAGNOSIS — I129 Hypertensive chronic kidney disease with stage 1 through stage 4 chronic kidney disease, or unspecified chronic kidney disease: Secondary | ICD-10-CM | POA: Diagnosis not present

## 2021-01-09 DIAGNOSIS — R630 Anorexia: Secondary | ICD-10-CM | POA: Diagnosis not present

## 2021-01-09 DIAGNOSIS — Z8616 Personal history of COVID-19: Secondary | ICD-10-CM | POA: Diagnosis not present

## 2021-01-09 DIAGNOSIS — I872 Venous insufficiency (chronic) (peripheral): Secondary | ICD-10-CM | POA: Diagnosis not present

## 2021-01-09 DIAGNOSIS — N184 Chronic kidney disease, stage 4 (severe): Secondary | ICD-10-CM | POA: Diagnosis not present

## 2021-01-09 DIAGNOSIS — Z436 Encounter for attention to other artificial openings of urinary tract: Secondary | ICD-10-CM | POA: Diagnosis not present

## 2021-01-09 DIAGNOSIS — R0682 Tachypnea, not elsewhere classified: Secondary | ICD-10-CM | POA: Diagnosis not present

## 2021-01-09 DIAGNOSIS — Z8673 Personal history of transient ischemic attack (TIA), and cerebral infarction without residual deficits: Secondary | ICD-10-CM | POA: Diagnosis not present

## 2021-01-09 DIAGNOSIS — Z8551 Personal history of malignant neoplasm of bladder: Secondary | ICD-10-CM | POA: Diagnosis not present

## 2021-01-09 DIAGNOSIS — R001 Bradycardia, unspecified: Secondary | ICD-10-CM | POA: Diagnosis not present

## 2021-01-09 DIAGNOSIS — L989 Disorder of the skin and subcutaneous tissue, unspecified: Secondary | ICD-10-CM | POA: Diagnosis not present

## 2021-01-10 DIAGNOSIS — R001 Bradycardia, unspecified: Secondary | ICD-10-CM | POA: Diagnosis not present

## 2021-01-10 DIAGNOSIS — Z8619 Personal history of other infectious and parasitic diseases: Secondary | ICD-10-CM | POA: Diagnosis not present

## 2021-01-10 DIAGNOSIS — R630 Anorexia: Secondary | ICD-10-CM | POA: Diagnosis not present

## 2021-01-10 DIAGNOSIS — R680 Hypothermia, not associated with low environmental temperature: Secondary | ICD-10-CM | POA: Diagnosis not present

## 2021-01-10 DIAGNOSIS — R0682 Tachypnea, not elsewhere classified: Secondary | ICD-10-CM | POA: Diagnosis not present

## 2021-01-10 DIAGNOSIS — Z87440 Personal history of urinary (tract) infections: Secondary | ICD-10-CM | POA: Diagnosis not present

## 2021-01-11 DEATH — deceased

## 2021-01-25 ENCOUNTER — Ambulatory Visit: Payer: Self-pay | Admitting: *Deleted

## 2021-03-07 ENCOUNTER — Other Ambulatory Visit: Payer: Self-pay | Admitting: Family Medicine

## 2021-04-01 ENCOUNTER — Ambulatory Visit: Payer: Medicare Other | Admitting: Family Medicine
